# Patient Record
Sex: Female | Born: 1945 | Hispanic: No | Marital: Married | Smoking: Never smoker
Health system: Southern US, Community
[De-identification: ages and names within clinical notes are randomized; demographics above are authoritative.]

## PROBLEM LIST (undated history)

## (undated) DIAGNOSIS — I4819 Other persistent atrial fibrillation: Secondary | ICD-10-CM

## (undated) DIAGNOSIS — N189 Chronic kidney disease, unspecified: Secondary | ICD-10-CM

## (undated) DIAGNOSIS — G4730 Sleep apnea, unspecified: Secondary | ICD-10-CM

## (undated) DIAGNOSIS — I5022 Chronic systolic (congestive) heart failure: Secondary | ICD-10-CM

## (undated) DIAGNOSIS — D649 Anemia, unspecified: Secondary | ICD-10-CM

## (undated) DIAGNOSIS — K219 Gastro-esophageal reflux disease without esophagitis: Secondary | ICD-10-CM

## (undated) DIAGNOSIS — I499 Cardiac arrhythmia, unspecified: Secondary | ICD-10-CM

## (undated) DIAGNOSIS — I10 Essential (primary) hypertension: Secondary | ICD-10-CM

## (undated) DIAGNOSIS — M1990 Unspecified osteoarthritis, unspecified site: Secondary | ICD-10-CM

## (undated) HISTORY — DX: Other persistent atrial fibrillation: I48.19

## (undated) HISTORY — PX: TONSILLECTOMY: SUR1361

## (undated) HISTORY — PX: OTHER SURGICAL HISTORY: SHX169

---

## 2007-07-13 ENCOUNTER — Emergency Department (HOSPITAL_COMMUNITY): Admission: EM | Admit: 2007-07-13 | Discharge: 2007-07-13 | Payer: Self-pay | Admitting: Emergency Medicine

## 2007-10-02 ENCOUNTER — Encounter: Admission: RE | Admit: 2007-10-02 | Discharge: 2007-10-02 | Payer: Self-pay | Admitting: Orthopedic Surgery

## 2013-09-23 ENCOUNTER — Encounter (HOSPITAL_COMMUNITY): Payer: Self-pay | Admitting: *Deleted

## 2013-09-23 ENCOUNTER — Inpatient Hospital Stay (HOSPITAL_COMMUNITY)
Admission: EM | Admit: 2013-09-23 | Discharge: 2013-09-27 | DRG: 286 | Disposition: A | Discharge status: Home / Self Care | Payer: Medicare Other | Attending: Cardiology | Admitting: Cardiology

## 2013-09-23 ENCOUNTER — Emergency Department (HOSPITAL_COMMUNITY): Payer: Medicare Other

## 2013-09-23 DIAGNOSIS — I472 Ventricular tachycardia, unspecified: Secondary | ICD-10-CM | POA: Diagnosis present

## 2013-09-23 DIAGNOSIS — I4729 Other ventricular tachycardia: Secondary | ICD-10-CM | POA: Diagnosis present

## 2013-09-23 DIAGNOSIS — I2510 Atherosclerotic heart disease of native coronary artery without angina pectoris: Secondary | ICD-10-CM | POA: Diagnosis present

## 2013-09-23 DIAGNOSIS — I4891 Unspecified atrial fibrillation: Principal | ICD-10-CM | POA: Diagnosis present

## 2013-09-23 DIAGNOSIS — I428 Other cardiomyopathies: Secondary | ICD-10-CM | POA: Diagnosis present

## 2013-09-23 DIAGNOSIS — Z6841 Body Mass Index (BMI) 40.0 and over, adult: Secondary | ICD-10-CM

## 2013-09-23 DIAGNOSIS — I2789 Other specified pulmonary heart diseases: Secondary | ICD-10-CM | POA: Diagnosis present

## 2013-09-23 DIAGNOSIS — K219 Gastro-esophageal reflux disease without esophagitis: Secondary | ICD-10-CM | POA: Diagnosis present

## 2013-09-23 DIAGNOSIS — I5021 Acute systolic (congestive) heart failure: Secondary | ICD-10-CM | POA: Diagnosis present

## 2013-09-23 DIAGNOSIS — Z79899 Other long term (current) drug therapy: Secondary | ICD-10-CM

## 2013-09-23 DIAGNOSIS — I509 Heart failure, unspecified: Secondary | ICD-10-CM | POA: Diagnosis present

## 2013-09-23 DIAGNOSIS — Z7982 Long term (current) use of aspirin: Secondary | ICD-10-CM

## 2013-09-23 DIAGNOSIS — M1990 Unspecified osteoarthritis, unspecified site: Secondary | ICD-10-CM | POA: Diagnosis present

## 2013-09-23 DIAGNOSIS — E876 Hypokalemia: Secondary | ICD-10-CM | POA: Diagnosis present

## 2013-09-23 HISTORY — DX: Unspecified osteoarthritis, unspecified site: M19.90

## 2013-09-23 HISTORY — DX: Morbid (severe) obesity due to excess calories: E66.01

## 2013-09-23 HISTORY — DX: Chronic systolic (congestive) heart failure: I50.22

## 2013-09-23 HISTORY — DX: Gastro-esophageal reflux disease without esophagitis: K21.9

## 2013-09-23 LAB — APTT: aPTT: 28 seconds (ref 24–37)

## 2013-09-23 LAB — BASIC METABOLIC PANEL
BUN: 23 mg/dL (ref 6–23)
CO2: 23 mEq/L (ref 19–32)
Calcium: 8.9 mg/dL (ref 8.4–10.5)
Chloride: 102 mEq/L (ref 96–112)
Creatinine, Ser: 0.89 mg/dL (ref 0.50–1.10)
GFR calc Af Amer: 77 mL/min — ABNORMAL LOW (ref >90)
GFR calc non Af Amer: 66 mL/min — ABNORMAL LOW (ref >90)
Glucose, Bld: 139 mg/dL — ABNORMAL HIGH (ref 70–99)
Potassium: 4.3 mEq/L (ref 3.5–5.1)
Sodium: 139 mEq/L (ref 135–145)

## 2013-09-23 LAB — CBC
HCT: 42.6 % (ref 36.0–46.0)
Hemoglobin: 13.5 g/dL (ref 12.0–15.0)
MCH: 26.7 pg (ref 26.0–34.0)
MCHC: 31.7 g/dL (ref 30.0–36.0)
MCV: 84.2 fL (ref 78.0–100.0)
Platelets: 429 10*3/uL — ABNORMAL HIGH (ref 150–400)
RBC: 5.06 MIL/uL (ref 3.87–5.11)
RDW: 14.2 % (ref 11.5–15.5)
WBC: 8.6 10*3/uL (ref 4.0–10.5)

## 2013-09-23 LAB — PROTIME-INR
INR: 1.09 (ref 0.00–1.49)
Prothrombin Time: 13.9 seconds (ref 11.6–15.2)

## 2013-09-23 LAB — POCT I-STAT TROPONIN I: Troponin i, poc: 0.05 ng/mL (ref 0.00–0.08)

## 2013-09-23 MED ORDER — DILTIAZEM HCL 25 MG/5ML IV SOLN
20.0000 mg | Freq: Once | INTRAVENOUS | Status: AC
  Administered 2013-09-23: 20 mg via INTRAVENOUS

## 2013-09-23 MED ORDER — DILTIAZEM HCL 100 MG IV SOLR
5.0000 mg/h | INTRAVENOUS | Status: DC
  Administered 2013-09-23 – 2013-09-24 (×3): 5 mg/h via INTRAVENOUS
  Filled 2013-09-23 (×2): qty 100

## 2013-09-23 MED ORDER — SODIUM CHLORIDE 0.9 % IV SOLN: 250.0000 mL | INTRAVENOUS | Status: DC | PRN

## 2013-09-23 MED ORDER — FUROSEMIDE 10 MG/ML IJ SOLN
40.0000 mg | Freq: Once | INTRAMUSCULAR | Status: AC
  Administered 2013-09-24: 40 mg via INTRAVENOUS
  Filled 2013-09-23: qty 4

## 2013-09-23 MED ORDER — ALPRAZOLAM 0.25 MG PO TABS: 0.2500 mg | ORAL_TABLET | Freq: Two times a day (BID) | ORAL | Status: DC | PRN

## 2013-09-23 MED ORDER — SODIUM CHLORIDE 0.9 % IJ SOLN
3.0000 mL | Freq: Two times a day (BID) | INTRAMUSCULAR | Status: DC
  Administered 2013-09-25 (×2): 3 mL via INTRAVENOUS
  Administered 2013-09-26: 6 mL via INTRAVENOUS
  Administered 2013-09-26: 3 mL via INTRAVENOUS

## 2013-09-23 MED ORDER — FAMOTIDINE 20 MG PO TABS
20.0000 mg | ORAL_TABLET | Freq: Two times a day (BID) | ORAL | Status: DC
  Administered 2013-09-24 – 2013-09-27 (×8): 20 mg via ORAL
  Filled 2013-09-23 (×10): qty 1

## 2013-09-23 MED ORDER — ACETAMINOPHEN 325 MG PO TABS: 650.0000 mg | ORAL_TABLET | ORAL | Status: DC | PRN

## 2013-09-23 MED ORDER — HEPARIN (PORCINE) IN NACL 100-0.45 UNIT/ML-% IJ SOLN
1450.0000 [IU]/h | Freq: Once | INTRAMUSCULAR | Status: AC
  Administered 2013-09-23: 1450 [IU]/h via INTRAVENOUS
  Filled 2013-09-23: qty 250

## 2013-09-23 MED ORDER — SODIUM CHLORIDE 0.9 % IJ SOLN: 3.0000 mL | INTRAMUSCULAR | Status: DC | PRN

## 2013-09-23 MED ORDER — ASPIRIN EC 81 MG PO TBEC
81.0000 mg | DELAYED_RELEASE_TABLET | Freq: Every day | ORAL | Status: DC
  Administered 2013-09-24 – 2013-09-25 (×2): 81 mg via ORAL
  Filled 2013-09-23 (×2): qty 1

## 2013-09-23 MED ORDER — ZOLPIDEM TARTRATE 5 MG PO TABS: 5.0000 mg | ORAL_TABLET | Freq: Every evening | ORAL | Status: DC | PRN

## 2013-09-23 MED ORDER — ONDANSETRON HCL 4 MG/2ML IJ SOLN
4.0000 mg | Freq: Four times a day (QID) | INTRAMUSCULAR | Status: DC | PRN
Start: 2013-09-23 — End: 2013-09-27

## 2013-09-23 MED ORDER — HEPARIN BOLUS VIA INFUSION
5000.0000 [IU] | Freq: Once | INTRAVENOUS | Status: AC
  Administered 2013-09-23: 5000 [IU] via INTRAVENOUS
  Filled 2013-09-23: qty 5000

## 2013-09-23 MED ORDER — HEPARIN (PORCINE) IN NACL 100-0.45 UNIT/ML-% IJ SOLN
1500.0000 [IU]/h | INTRAMUSCULAR | Status: DC
  Administered 2013-09-23: 1450 [IU]/h via INTRAVENOUS
  Administered 2013-09-24 – 2013-09-25 (×2): 1550 [IU]/h via INTRAVENOUS
  Administered 2013-09-25 (×2): 1750 [IU]/h via INTRAVENOUS
  Filled 2013-09-23 (×6): qty 250

## 2013-09-23 MED ORDER — NITROGLYCERIN 0.4 MG SL SUBL: 0.4000 mg | SUBLINGUAL_TABLET | SUBLINGUAL | Status: DC | PRN

## 2013-09-23 NOTE — ED Provider Notes (Signed)
CSN: [id number]     Arrival date & time 09/23/13  1459 History   First MD Initiated Contact with Patient 09/23/13 1523     Chief Complaint  Patient presents with  . Irregular Heart Beat  . Heartburn  . Shortness of Breath   (Consider location/radiation/quality/duration/timing/severity/associated sxs/prior Treatment) HPI Comments: Began taking meloxicam for her knee pain about 6 weeks ago. Began having heartburn for past 5 weeks. Intermittent daily heartburn with occasional palpitations. Worsening over the past 2 weeks. States no true palpitations, just some queasy feelings in her chest, relieved with belching. No chest pain, no SOB. No hx of afib. At her doctor's office today, found to be in Afib with RVR, sent here for further eval.   Patient is a 67 y.o. female presenting with heartburn and shortness of breath.  Heartburn This is a recurrent problem. The current episode started more than 1 week ago (over 5 weeks ago). The problem occurs daily. The problem has been gradually worsening. Pertinent negatives include no chest pain, no abdominal pain, no headaches and no shortness of breath. Nothing aggravates the symptoms. Nothing relieves the symptoms. She has tried nothing for the symptoms.  Shortness of Breath Associated symptoms: no abdominal pain, no chest pain, no cough, no fever and no headaches     History reviewed. No pertinent past medical history. Past Surgical History  Procedure Laterality Date  . Joint replacement     No family history on file. History  Substance Use Topics  . Smoking status: Never Smoker   . Smokeless tobacco: Not on file  . Alcohol Use: No   OB History   Grav Para Term Preterm Abortions TAB SAB Ect Mult Living                 Review of Systems  Constitutional: Negative for fever.  Respiratory: Negative for cough and shortness of breath.   Cardiovascular: Negative for chest pain.  Gastrointestinal: Positive for heartburn. Negative for abdominal  pain.  Neurological: Negative for headaches.  All other systems reviewed and are negative.    Allergies  Review of patient's allergies indicates no known allergies.  Home Medications   Current Outpatient Rx  Name  Route  Sig  Dispense  Refill  . BETAINE PO   Oral   Take 2 capsules by mouth daily as needed (acid reflux).         . Digestive Enzymes (MULTI-ENZYME PO)   Oral   Take 1 capsule by mouth daily as needed (acid reflux).          BP 139/92  Pulse 137  Temp(Src) 97.6 F (36.4 C) (Oral)  Resp 20  Wt 269 lb 12.8 oz (122.38 kg)  SpO2 94% Physical Exam  Nursing note and vitals reviewed. Constitutional: She is oriented to person, place, and time. She appears well-developed and well-nourished. No distress.  HENT:  Head: Normocephalic and atraumatic.  Eyes: EOM are normal. Pupils are equal, round, and reactive to light.  Neck: Normal range of motion. Neck supple.  Cardiovascular: An irregularly irregular rhythm present. Tachycardia present.  Exam reveals no friction rub.   No murmur heard. Pulmonary/Chest: Effort normal and breath sounds normal. No respiratory distress. She has no wheezes. She has no rales.  Abdominal: Soft. She exhibits no distension. There is no tenderness. There is no rebound.  Musculoskeletal: Normal range of motion. She exhibits no edema.  Neurological: She is alert and oriented to person, place, and time. No cranial nerve deficit. She exhibits  normal muscle tone. Coordination normal.  Skin: No rash noted. She is not diaphoretic.    ED Course  Procedures (including critical care time) Labs Review Labs Reviewed  CBC  BASIC METABOLIC PANEL  TSH  URINE RAPID DRUG SCREEN (HOSP PERFORMED)  PROTIME-INR  APTT   Imaging Review Dg Chest 2 View  09/23/2013   CLINICAL DATA:  Chest pain and shortness of breath  EXAM: CHEST  2 VIEW  COMPARISON:  None  FINDINGS: Cardiac shadow is enlarged. There is prominence of the central pulmonary vascularity  identified. No focal infiltrate or sizable effusion is seen. There is fullness surrounding the left pulmonary artery. This is somewhat suspicious for a mass lesion but is not as well visualized on the lateral projection. No acute bony abnormality is noted.  IMPRESSION: Prominence of the central vascularity.  Prominent soft tissue density adjacent to the left pulmonary artery. CT of the chest is recommended for further evaluation to rule out an underlying mass.   Electronically Signed   By: Alcide Clever M.D.   On: 09/23/2013 15:50    Date: 09/23/2013  Rate: 137  Rhythm: atrial fibrillation and with RVR  QRS Axis: normal  Intervals: normal  ST/T Wave abnormalities: flipped T waves in III  Conduction Disutrbances:left posterior fascicular block  Narrative Interpretation:   Old EKG Reviewed: none available  CRITICAL CARE Performed by: Dagmar Hait   Total critical care time: 30  Critical care time was exclusive of separately billable procedures and treating other patients.  Critical care was necessary to treat or prevent imminent or life-threatening deterioration.  Critical care was time spent personally by me on the following activities: development of treatment plan with patient and/or surrogate as well as nursing, discussions with consultants, evaluation of patient's response to treatment, examination of patient, obtaining history from patient or surrogate, ordering and performing treatments and interventions, ordering and review of laboratory studies, ordering and review of radiographic studies, pulse oximetry and re-evaluation of patient's condition.   MDM   1. Atrial fibrillation with RVR    86F sent here with new onset Afib. In RVR on arrival. Normal BPs. Denying CP, SOB. Well-appearing, relaxing comfortably. EKG with Afib with RVR, no acute ischemia. Dilt bolus and drip initiated. With true unknown onset, possibly over past 5 weeks, heparin bolus initiated for  anti-coagulation. After drip started, heart rate much better controlled. Patient relaxing, doing well.  Cardiology consulted and will admit.     Dagmar Hait, MD 09/24/13 [phone]

## 2013-09-23 NOTE — ED Notes (Signed)
Admitting physician at bedside

## 2013-09-23 NOTE — ED Notes (Signed)
Pt was sent here from MD office after having intermittent indigestion feeling and states gets sob with these episodes.  Pt shown to be in afib in the 150s at MD office and sent here.

## 2013-09-23 NOTE — Progress Notes (Signed)
ANTICOAGULATION CONSULT NOTE - Initial Consult  Pharmacy Consult for heparin Indication: atrial fibrillation  No Known Allergies  Patient Measurements: Weight: 269 lb 12.8 oz (122.38 kg) Heparin Dosing Weight: 100kg  Vital Signs: Temp: 97.6 F (36.4 C) (10/06 1508) Temp src: Oral (10/06 1508) BP: 139/92 mmHg (10/06 1508) Pulse Rate: 137 (10/06 1508)  Labs:  Recent Labs  09/23/13 1510 09/23/13 1600  HGB 13.5  --   HCT 42.6  --   PLT 429*  --   APTT  --  28  LABPROT  --  13.9  INR  --  1.09    CrCl is unknown because no creatinine reading has been taken and the patient has no height on file.   Medical History: History reviewed. No pertinent past medical history.  Assessment: 67 year old female with new onset afib with RVR. Orders received to start IV heparin and dilt. CBC/coags within normal limits.  Goal of Therapy:  Heparin level 0.3-0.7 units/ml Monitor platelets by anticoagulation protocol: Yes   Plan:  Give 5000 units bolus x 1 Start heparin infusion at 1450 units/hr Check anti-Xa level in 6 hours and daily while on heparin Continue to monitor H&H and platelets  Sheppard Coil PharmD., BCPS Clinical Pharmacist Pager [phone] 09/23/2013 8:38 PM

## 2013-09-23 NOTE — H&P (Signed)
Patient ID: Kelli Webster MRN: [id number], DOB/AGE: [birth year]   Admit date: 09/23/2013  Primary Physician: Herb Grays, MD Primary Cardiologist: new - seen by Eloy End, MD   Pt. Profile:  67 year old female without prior cardiac history presented to ED today with a month plus history of GERD-like symptoms and has been found to be in atrial fibrillation with rapid ventricular response.  Problem List  Past Medical History  Diagnosis Date  . GERD (gastroesophageal reflux disease)   . Morbid obesity   . A-fib     a. Dx 09/2013  . Osteoarthritis     Past Surgical History  Procedure Laterality Date  . Joint replacement       Allergies  No Known Allergies  HPI  67 year old female with the above problem list. She does not frequently see a physician and this has little limited known medical history. Over the summer, she became concerned about her weight and by her description began a crash diet. At the same time, she was taking Mobic for knee pain. During that time, she began to experience increasing indigestion-like symptoms involving both her epigastrium and substernal areas often occurring about one hour after a meal associated with a metallic taste in her mouth as well as cough, and resolving with over-the-counter antacids. Because of the symptoms, she stopped her crash diet and also discontinue Mobic. She tried taking Nexium however this caused been nauseated. She subsequently tried Prilosec and this also caused nausea. As result, she treated her symptoms with when necessary TUMS and also Zantac with success acutely, though she would experience recurrent symptoms with subsequent meals or with lying down. At one point, she saw some in a health food store who advised her that she likely had low acid and she began taking 2 over-the-counter supplements for treatment of low acid. After beginning the supplements, she did have some relief from her symptoms within a week she had recurrence.     Over the past month, her GERD-like symptoms and indigestion have worsened and become associated with abdominal bloating, frequent cough, and frequent belching. She has also been experiencing dyspnea exertion and more recently lower extremity edema. She reports that do to GERD-like symptoms, she's not been able to lie flat in bed and as result she has been sleeping sitting; and with her feet in a dependent position. She attributes her edema to this. On 2 occasions, she had exertional epigastric and substernal chest discomfort which began as feeling as though her stomach is burning and and this worked her way up into her epigastric and chest area and was associated with dyspnea. This resolved with rest and antacids.   Due to her progressive indigestion symptoms, she was seen by her primary care provider today. She was noted to be tachycardic and an ECG was taken and showed atrial fibrillation with rapid ventricular response. At that point she was sent to the emergency department for further evaluation. Here, she's been placed on IV diltiazem as well as IV heparin and her rate has come down from the 130s to low 100s. She denies any recent history of tachypalpitations, though she admits to occasional fluttering palpitations in the remote past. Her CHADS2 = 0;  CHA2DS2VASc = 2 (age 47, female).  Home Medications  Prior to Admission medications   Medication Sig Start Date End Date Taking? Authorizing Provider  aspirin 325 MG tablet Take 325 mg by mouth daily as needed for pain.   Yes Historical Provider, MD  BETAINE PO Take  2 capsules by mouth daily as needed (acid reflux).   Yes Historical Provider, MD  Calcium Carbonate Antacid (ANTACID PO) Take 1 tablet by mouth daily as needed (acid reflux).   Yes Historical Provider, MD  Digestive Enzymes (MULTI-ENZYME PO) Take 1 capsule by mouth daily as needed (acid reflux).   Yes Historical Provider, MD  esomeprazole (NEXIUM) 40 MG capsule Take 40 mg by mouth daily  before breakfast.   Yes Historical Provider, MD  omeprazole (PRILOSEC) 20 MG capsule Take 20 mg by mouth daily.   Yes Historical Provider, MD  Ranitidine HCl (ZANTAC PO) Take 1 tablet by mouth 2 (two) times daily as needed (acid reflux).   Yes Historical Provider, MD    Family History  Family History  Problem Relation Age of Onset  . Heart attack Father     died @ 62, multiple MI's  . Other Mother     alive and well in her 85's.  . Other Brother     A & W  . Other Sister     A & W  . Other Sister     A & W    Social History  History   Social History  . Marital Status: Married    Spouse Name: N/A    Number of Children: N/A  . Years of Education: N/A   Occupational History  . Not on file.   Social History Main Topics  . Smoking status: Never Smoker   . Smokeless tobacco: Not on file  . Alcohol Use: No  . Drug Use: No  . Sexual Activity: Not on file   Other Topics Concern  . Not on file   Social History Narrative   Lives in [place] with husband.  Does not routinely exercise.     Review of Systems General:  No chills, fever, night sweats or weight changes.  Cardiovascular:  +++ dyspnea on exertion as outline above.  She has had both epigastric and substernal chest discomfort as outlined above, which she attributes to GERD.  +++ bilat LE edema in the past 2-3 wks.  occas palpitations.  No orthopnea, parox. nocturnal dyspnea. Dermatological: No rash, lesions/masses Respiratory: +++ cough, +++ dyspnea Urologic: No hematuria, dysuria Abdominal:  +++ indigestion/burning/metallic taste/belching/bloathing.  No nausea, vomiting, diarrhea, bright red blood per rectum, melena, or hematemesis Neurologic:  No visual changes, wkns, changes in mental status. All other systems reviewed and are otherwise negative except as noted above.  Physical Exam  Blood pressure 145/89, pulse 97, temperature 97.6 F (36.4 C), temperature source Oral, resp. rate 21, weight 269 lb 12.8 oz  (122.38 kg), SpO2 95.00%.  General: Pleasant, NAD Psych: Normal affect. Neuro: Alert and oriented X 3. Moves all extremities spontaneously. HEENT: Normal  Neck: Supple without bruits or JVD. Lungs:  Resp regular and unlabored, few left basilar crackles. Heart: IR, IR no s3, s4, or murmurs. Abdomen: Soft, non-tender, non-distended, BS + x 4.  Extremities: No clubbing, cyanosis.  2+ bilat LE edema to mid-calf. DP/PT/Radials 2+ and equal bilaterally.  Labs  Trop i, poc: 0.05  Lab Results  Component Value Date   WBC 8.6 09/23/2013   HGB 13.5 09/23/2013   HCT 42.6 09/23/2013   MCV 84.2 09/23/2013   PLT 429* 09/23/2013    Recent Labs Lab 09/23/13 1510  NA 139  K 4.3  CL 102  CO2 23  BUN 23  CREATININE 0.89  CALCIUM 8.9  GLUCOSE 139*    Radiology/Studies  Dg Chest 2 View  09/23/2013   CLINICAL DATA:  Chest pain and shortness of breath  EXAM: CHEST  2 VIEW  COMPARISON:  None  FINDINGS: Cardiac shadow is enlarged. There is prominence of the central pulmonary vascularity identified. No focal infiltrate or sizable effusion is seen. There is fullness surrounding the left pulmonary artery. This is somewhat suspicious for a mass lesion but is not as well visualized on the lateral projection. No acute bony abnormality is noted.  IMPRESSION: Prominence of the central vascularity.  Prominent soft tissue density adjacent to the left pulmonary artery. CT of the chest is recommended for further evaluation to rule out an underlying mass.   Electronically Signed   By: Alcide Clever M.D.   On: 09/23/2013 15:50   ECG  Afib, 137, pvc, inflat st dep with twi/biphasic t's (no old for comparison).  ASSESSMENT AND PLAN  1.  Afib RVR:  Pt presents with a several month h/o GERD-like Ss and indigestion with a 1 month h/o doe, cough, and LEE over the past 1-2 wks.  She has had intermittent palpitations in the past but nothing sustained that she is aware of.  She was found to be in afib with RVR today. Rates  are currently better on IV diltiazem.  We had a long discussion about the diagnosis and management of atrial fibrillation.  She seems to be convinced that her GERD symptoms are the cause.  We will admit, cycle CE, check TSH/Mg, and 2D echocardiogram.  Cont IV dilt and plan to switch to PO if she converts overnight.  Cont heparin for now.  CHADS2= 0.  CHA2DS2VASc = 2 (female/66).  If she converts by the AM, plan to switch to PO ccb vs bb and add ASA.  If she does not convert by the AM but can be adequately rate controlled, we will likely plan to anticoagulate for 3 wks and pursue cardioversion after that period of time.  If however her HR remains elevated, she will likely need TEE and cardioversion tomorrow followed by therapeutic anticoagulation.  Regarding anticoagulation, given severe GERD Ss since taking mobic over the summer, she would likely benefit from GI eval prior to committing her to a prolonged course of oral anticoagulation.  Call GI in AM.    2.  GERD/? PUD:  Severe Gerd-like Ss over the past few months.  She reports difficult management at least partially related to not tolerating PPI's 2/2 nausea.  She has tolerated H2 blockers.  Will add pepcid while in house.  If she requires oral anticoagulation, she will need to see GI prior to initiating, though H/H is currently stable.  F/U in AM as she will be on heparin tonight.  She has had some exertional "indigestion" associated with dyspnea.  If CE negative, will benefit from inpt vs outpt stress testing once other issues are taken care of.  3.  Mild volume overload:  Likely acute/chronic diast chf in setting of afib of unknown duration.  She has crackles on exam and lower extremity edema.  Will add lasix tonight.  Check echo in am.  Focus on HR control as above.  4.  Abnl CXR:  Soft tissue density noted adjacent to the L PA.  Will order CT with contrast for AM to re-eval as recommended.  Creat nl.  Signed, Nicolasa Ducking, NP 09/23/2013, 8:08  PM  The patient was seen, examined and discussed with Ward Givens, NP. 67 year old female with h/o GERD who was seen by her PCP for DOE and  was found to be in A-fib with RVR. She was started on Heparin drip and Diltiazem drip, currently rate controlled. The patient is volume overloaded with normal kidney function. We will order Lasix iv for diuresis. She describes severe GERD like symptoms after chronic high dose of NSAIDS and intolerance of PPIs. Plan is to perform TTE in the morning. We will consult GI for potential endoscopy prior to TEE. NPO after midnight in case urgent cardioversion is necessary.  TSH pending.  Tobias Alexander, H 09/23/2013

## 2013-09-23 NOTE — ED Notes (Signed)
Pt transported to xray 

## 2013-09-24 ENCOUNTER — Observation Stay (HOSPITAL_COMMUNITY): Payer: Medicare Other

## 2013-09-24 DIAGNOSIS — I4891 Unspecified atrial fibrillation: Principal | ICD-10-CM

## 2013-09-24 DIAGNOSIS — I059 Rheumatic mitral valve disease, unspecified: Secondary | ICD-10-CM

## 2013-09-24 LAB — TROPONIN I
Troponin I: <0.3 ng/mL (ref <0.30)
Troponin I: <0.3 ng/mL (ref <0.30)
Troponin I: <0.3 ng/mL (ref <0.30)

## 2013-09-24 LAB — LIPID PANEL
Cholesterol: 121 mg/dL (ref 0–200)
HDL: 45 mg/dL (ref >39)
LDL Cholesterol: 62 mg/dL (ref 0–99)
Total CHOL/HDL Ratio: 2.7 RATIO
Triglycerides: 72 mg/dL (ref <150)
VLDL: 14 mg/dL (ref 0–40)

## 2013-09-24 LAB — COMPREHENSIVE METABOLIC PANEL
ALT: 24 U/L (ref 0–35)
AST: 27 U/L (ref 0–37)
Albumin: 3.3 g/dL — ABNORMAL LOW (ref 3.5–5.2)
Alkaline Phosphatase: 55 U/L (ref 39–117)
BUN: 18 mg/dL (ref 6–23)
CO2: 26 mEq/L (ref 19–32)
Calcium: 8.5 mg/dL (ref 8.4–10.5)
Chloride: 103 mEq/L (ref 96–112)
Creatinine, Ser: 0.94 mg/dL (ref 0.50–1.10)
GFR calc Af Amer: 72 mL/min — ABNORMAL LOW (ref >90)
GFR calc non Af Amer: 62 mL/min — ABNORMAL LOW (ref >90)
Glucose, Bld: 105 mg/dL — ABNORMAL HIGH (ref 70–99)
Potassium: 3.6 mEq/L (ref 3.5–5.1)
Sodium: 143 mEq/L (ref 135–145)
Total Bilirubin: 1 mg/dL (ref 0.3–1.2)
Total Protein: 6.2 g/dL (ref 6.0–8.3)

## 2013-09-24 LAB — CBC
HCT: 39.4 % (ref 36.0–46.0)
Hemoglobin: 12.5 g/dL (ref 12.0–15.0)
MCH: 26.9 pg (ref 26.0–34.0)
MCHC: 31.7 g/dL (ref 30.0–36.0)
MCV: 84.9 fL (ref 78.0–100.0)
Platelets: 378 10*3/uL (ref 150–400)
RBC: 4.64 MIL/uL (ref 3.87–5.11)
RDW: 14.4 % (ref 11.5–15.5)
WBC: 7.4 10*3/uL (ref 4.0–10.5)

## 2013-09-24 LAB — HEPARIN LEVEL (UNFRACTIONATED)
Heparin Unfractionated: 0.3 IU/mL (ref 0.30–0.70)
Heparin Unfractionated: 0.42 IU/mL (ref 0.30–0.70)

## 2013-09-24 LAB — PRO B NATRIURETIC PEPTIDE: Pro B Natriuretic peptide (BNP): 4997 pg/mL — ABNORMAL HIGH (ref 0–125)

## 2013-09-24 LAB — TSH: TSH: 1.198 u[IU]/mL (ref 0.350–4.500)

## 2013-09-24 LAB — MAGNESIUM
Magnesium: 2 mg/dL (ref 1.5–2.5)
Magnesium: 1.7 mg/dL (ref 1.5–2.5)

## 2013-09-24 MED ORDER — IOHEXOL 300 MG/ML  SOLN
80.0000 mL | Freq: Once | INTRAMUSCULAR | Status: AC | PRN
  Administered 2013-09-24: 80 mL via INTRAVENOUS

## 2013-09-24 MED ORDER — MENTHOL 3 MG MT LOZG
1.0000 | LOZENGE | OROMUCOSAL | Status: DC | PRN
  Administered 2013-09-24: 3 mg via ORAL
  Filled 2013-09-24 (×2): qty 9

## 2013-09-24 MED ORDER — FUROSEMIDE 10 MG/ML IJ SOLN
40.0000 mg | Freq: Two times a day (BID) | INTRAMUSCULAR | Status: DC
  Administered 2013-09-24 (×2): 40 mg via INTRAVENOUS
  Filled 2013-09-24 (×3): qty 4

## 2013-09-24 MED ORDER — OFF THE BEAT BOOK
Freq: Once | Status: AC
  Administered 2013-09-24: 02:00:00
  Filled 2013-09-24: qty 1

## 2013-09-24 NOTE — Progress Notes (Addendum)
Subjective:  Patient feels better this am. Heart rate still atrial fib rate 95. Modest diuresis. Weight unchanged. Will increase lasix. Peripheral edema still present.  Objective:  Vital Signs in the last 24 hours: Temp:  [97.6 F (36.4 C)-98.2 F (36.8 C)] 98.2 F (36.8 C) (10/07 0830) Pulse Rate:  [55-137] 87 (10/07 0830) Resp:  [18-33] 18 (10/07 0830) BP: (126-147)/(51-93) 146/86 mmHg (10/07 0830) SpO2:  [92 %-97 %] 92 % (10/07 0830) Weight:  [268 lb (121.564 kg)-269 lb 12.8 oz (122.38 kg)] 268 lb (121.564 kg) (10/07 0500)  Intake/Output from previous day: 10/06 0701 - 10/07 0700 In: 284.1 [I.V.:284.1] Out: -  Intake/Output from this shift:    . aspirin EC  81 mg Oral Daily  . famotidine  20 mg Oral BID  . furosemide  40 mg Intravenous Q12H  . sodium chloride  3 mL Intravenous Q12H   . diltiazem (CARDIZEM) infusion 5 mg/hr (09/24/13 0121)  . heparin 1,550 Units/hr (09/24/13 0318)    Physical Exam: The patient appears to be in no distress.  Head and neck exam reveals that the pupils are equal and reactive.  The extraocular movements are full.  There is no scleral icterus.  Mouth and pharynx are benign.  No lymphadenopathy.  No carotid bruits.  The jugular venous pressure is normal.  Thyroid is not enlarged or tender.  Chest is clear to percussion and auscultation.  No rales or rhonchi.  Expansion of the chest is symmetrical.  Heart reveals no abnormal lift or heave.  First and second heart sounds are normal.  There is no murmur gallop rub or click.  The abdomen is soft and nontender.  Bowel sounds are normoactive.  There is no hepatosplenomegaly or mass.  There are no abdominal bruits.  Extremities reveal 2+ pretibial and pedal edema.  Neurologic exam is normal strength and no lateralizing weakness.  No sensory deficits.  Integument reveals no rash  Lab Results:  Recent Labs  09/23/13 1510 09/24/13 0530  WBC 8.6 7.4  HGB 13.5 12.5  PLT 429* 378     Recent Labs  09/23/13 1510 09/24/13 0530  NA 139 143  K 4.3 3.6  CL 102 103  CO2 23 26  GLUCOSE 139* 105*  BUN 23 18  CREATININE 0.89 0.94    Recent Labs  09/24/13 0150 09/24/13 0530  TROPONINI <0.30 <0.30   Hepatic Function Panel  Recent Labs  09/24/13 0530  PROT 6.2  ALBUMIN 3.3*  AST 27  ALT 24  ALKPHOS 55  BILITOT 1.0    Recent Labs  09/24/13 0530  CHOL 121   No results found for this basename: PROTIME,  in the last 72 hours  Imaging: Dg Chest 2 View  09/23/2013   CLINICAL DATA:  Chest pain and shortness of breath  EXAM: CHEST  2 VIEW  COMPARISON:  None  FINDINGS: Cardiac shadow is enlarged. There is prominence of the central pulmonary vascularity identified. No focal infiltrate or sizable effusion is seen. There is fullness surrounding the left pulmonary artery. This is somewhat suspicious for a mass lesion but is not as well visualized on the lateral projection. No acute bony abnormality is noted.  IMPRESSION: Prominence of the central vascularity.  Prominent soft tissue density adjacent to the left pulmonary artery. CT of the chest is recommended for further evaluation to rule out an underlying mass.   Electronically Signed   By: Alcide Clever M.D.   On: 09/23/2013 15:50  Cardiac Studies: 2D echo pending. Assessment/Plan:  1. Atrial fibrillation. Rate better controlled. Will continue IV heparin and IV diltiazem. She may require TEE/cardioversion tomorrow. Will keep NPO tonight. 2. Volume overload. Renal function normal. Will increase lasix to 40 mg BID 3. Severe GERD symptoms.  Will ask GI to see. Expect will need endoscopy to help [place] decide whether outpatient anticoagulation is feasible. 4. Abnormal Chest xray.  CT pending. 5. Abnormal EKG.  Widespread STT changes. Troponins negative. Follow EKGs.  LOS: 1 day    Cassell Clement 09/24/2013, 10:04 AM

## 2013-09-24 NOTE — Progress Notes (Signed)
ANTICOAGULATION CONSULT NOTE  Pharmacy Consult for heparin Indication: atrial fibrillation  No Known Allergies  Patient Measurements: Height: 5\' 3"  (160 cm) Weight: 268 lb 1.6 oz (121.609 kg) IBW/kg (Calculated) : 52.4 Heparin Dosing Weight: 100kg  Vital Signs: Temp: 98.2 F (36.8 C) (10/06 2237) Temp src: Oral (10/06 2237) BP: 136/83 mmHg (10/07 0001) Pulse Rate: 95 (10/07 0001)  Labs:  Recent Labs  09/23/13 1510 09/23/13 1600 09/24/13 0150  HGB 13.5  --   --   HCT 42.6  --   --   PLT 429*  --   --   APTT  --  28  --   LABPROT  --  13.9  --   INR  --  1.09  --   HEPARINUNFRC  --   --  0.30  CREATININE 0.89  --   --   TROPONINI  --   --  <0.30    Estimated Creatinine Clearance: 78.6 ml/min (by C-G formula based on Cr of 0.89).  Assessment: 67 year old female with afib for heparin  Goal of Therapy:  Heparin level 0.3-0.7 units/ml Monitor platelets by anticoagulation protocol: Yes   Plan:  Increase Heparin 1550 units/hr to keep in range  Geannie Risen, PharmD, BCPS  09/24/2013 3:11 AM

## 2013-09-24 NOTE — Progress Notes (Signed)
ANTICOAGULATION CONSULT NOTE - Follow-Up  Pharmacy Consult for heparin Indication: atrial fibrillation  No Known Allergies  Patient Measurements: Height: 5\' 3"  (160 cm) Weight: 268 lb (121.564 kg) IBW/kg (Calculated) : 52.4 Heparin Dosing Weight: 100kg  Vital Signs: Temp: 98.6 F (37 C) (10/07 1100) Temp src: Oral (10/07 1100) BP: 148/91 mmHg (10/07 1100) Pulse Rate: 102 (10/07 1100)  Labs:  Recent Labs  09/23/13 1510 09/23/13 1600 09/24/13 0150 09/24/13 0530 09/24/13 1100 09/24/13 1106  HGB 13.5  --   --  12.5  --   --   HCT 42.6  --   --  39.4  --   --   PLT 429*  --   --  378  --   --   APTT  --  28  --   --   --   --   LABPROT  --  13.9  --   --   --   --   INR  --  1.09  --   --   --   --   HEPARINUNFRC  --   --  0.30  --  0.42  --   CREATININE 0.89  --   --  0.94  --   --   TROPONINI  --   --  <0.30 <0.30  --  <0.30    Estimated Creatinine Clearance: 74.4 ml/min (by C-G formula based on Cr of 0.94).  Assessment: 67 year old female on heparin for afib. Heparin level 0.42 (therapeutic). No bleeding noted. CBC ok.  Goal of Therapy:  Heparin level 0.3-0.7 units/ml Monitor platelets by anticoagulation protocol: Yes   Plan:  Continue Heparin at 1550 units/hr  F/u daily heparin level and CBC  Christoper Fabian, PharmD, BCPS Clinical pharmacist, pager [phone] 09/24/2013 1:23 PM

## 2013-09-24 NOTE — Progress Notes (Signed)
Echocardiogram 2D Echocardiogram has been performed.  Kelli Webster 09/24/2013, 4:30 PM

## 2013-09-24 NOTE — Consult Note (Signed)
Subjective:   HPI  The patient is a 67 year old female who was admitted to the hospital with atrial fibrillation with rapid ventricular response. We are asked to see her in regards to symptoms suggestive of gastroesophageal reflux which she has been experiencing for about 6 weeks. She describes some epigastric and substernal burning sensation. She tried some Nexium as well as Prilosec but these didn't seem to agree with her. She has been belching a lot. She denies vomiting. In the summer she was taking meloxicam but she stopped this around mid August. She gives no history of peptic ulcer disease. I spoke to Dr. Patty Sermons who would like to make sure that she does not have a significant upper GI lesion because she is probably going to need to be cardioverted and then put on anticoagulants as an outpatient.  Review of Systems Currently not short of breath or having chest pain  Past Medical History  Diagnosis Date  . GERD (gastroesophageal reflux disease)   . Morbid obesity   . A-fib     a. Dx 09/2013  . Osteoarthritis    Past Surgical History  Procedure Laterality Date  . Joint replacement     History   Social History  . Marital Status: Married    Spouse Name: N/A    Number of Children: N/A  . Years of Education: N/A   Occupational History  . Not on file.   Social History Main Topics  . Smoking status: Never Smoker   . Smokeless tobacco: Not on file  . Alcohol Use: No  . Drug Use: No  . Sexual Activity: Not on file   Other Topics Concern  . Not on file   Social History Narrative   Lives in [place] with husband.  Does not routinely exercise.   family history includes Heart attack in her father; Other in her brother, mother, sister, and sister. Current facility-administered medications:0.9 %  sodium chloride infusion, 250 mL, Intravenous, PRN, Ok Anis, NP;  acetaminophen (TYLENOL) tablet 650 mg, 650 mg, Oral, Q4H PRN, Ok Anis, NP;  ALPRAZolam Prudy Feeler) tablet  0.25 mg, 0.25 mg, Oral, BID PRN, Ok Anis, NP;  aspirin EC tablet 81 mg, 81 mg, Oral, Daily, Ok Anis, NP, 81 mg at 09/24/13 1003 diltiazem (CARDIZEM) 100 mg in dextrose 5 % 100 mL infusion, 5-15 mg/hr, Intravenous, Titrated, Dagmar Hait, MD, Last Rate: 5 mL/hr at 09/24/13 0121, 5 mg/hr at 09/24/13 0121;  famotidine (PEPCID) tablet 20 mg, 20 mg, Oral, BID, Ok Anis, NP, 20 mg at 09/24/13 1004;  furosemide (LASIX) injection 40 mg, 40 mg, Intravenous, Q12H, Cassell Clement, MD, 40 mg at 09/24/13 1009 heparin ADULT infusion 100 units/mL (25000 units/250 mL), 1,550 Units/hr, Intravenous, Continuous, Lars Masson, MD, Last Rate: 15.5 mL/hr at 09/24/13 1004, 1,550 Units/hr at 09/24/13 1004;  nitroGLYCERIN (NITROSTAT) SL tablet 0.4 mg, 0.4 mg, Sublingual, Q5 Min x 3 PRN, Ok Anis, NP;  ondansetron (ZOFRAN) injection 4 mg, 4 mg, Intravenous, Q6H PRN, Ok Anis, NP sodium chloride 0.9 % injection 3 mL, 3 mL, Intravenous, Q12H, Ok Anis, NP;  sodium chloride 0.9 % injection 3 mL, 3 mL, Intravenous, PRN, Ok Anis, NP;  zolpidem (AMBIEN) tablet 5 mg, 5 mg, Oral, QHS PRN, Ok Anis, NP No Known Allergies   Objective:     BP 148/91  Pulse 102  Temp(Src) 98.6 F (37 C) (Oral)  Resp 20  Ht 5\' 3"  (1.6 m)  Wt 121.564  kg (268 lb)  BMI 47.49 kg/m2  SpO2 96%  She is in no distress   Nonicteric  Heart irregular irregular rhythm  Lungs clear  Abdomen: Bowel sounds normal, soft, nontender  Laboratory No components found with this basename: d1      Assessment:     GERD  Atrial fibrillation      Plan:     I will go ahead and plan EGD tomorrow to evaluate for mucosal damage or Barrett's esophagus in the upper GI tract.

## 2013-09-24 NOTE — Progress Notes (Addendum)
Pt with 20 beats of VT, pt asymptomatic; pt states she was getting aggravated and sat up fast; pts BP 146/ 84, HR A-fib 101; Hurman Horn, Pa paged and made aware; will continue to monitor

## 2013-09-25 ENCOUNTER — Encounter (HOSPITAL_COMMUNITY): Payer: Self-pay | Admitting: *Deleted

## 2013-09-25 ENCOUNTER — Encounter (HOSPITAL_COMMUNITY)
Admission: EM | Disposition: A | Discharge status: Home / Self Care | Payer: Self-pay | Source: Home / Self Care | Attending: Cardiology

## 2013-09-25 DIAGNOSIS — I509 Heart failure, unspecified: Secondary | ICD-10-CM

## 2013-09-25 DIAGNOSIS — I5021 Acute systolic (congestive) heart failure: Secondary | ICD-10-CM

## 2013-09-25 HISTORY — PX: ESOPHAGOGASTRODUODENOSCOPY: SHX5428

## 2013-09-25 LAB — CBC
HCT: 37.9 % (ref 36.0–46.0)
Hemoglobin: 12.3 g/dL (ref 12.0–15.0)
MCH: 27.5 pg (ref 26.0–34.0)
MCHC: 32.5 g/dL (ref 30.0–36.0)
MCV: 84.6 fL (ref 78.0–100.0)
Platelets: 366 10*3/uL (ref 150–400)
RBC: 4.48 MIL/uL (ref 3.87–5.11)
RDW: 14.4 % (ref 11.5–15.5)
WBC: 7.6 10*3/uL (ref 4.0–10.5)

## 2013-09-25 LAB — BASIC METABOLIC PANEL
BUN: 13 mg/dL (ref 6–23)
CO2: 32 mEq/L (ref 19–32)
Calcium: 8.5 mg/dL (ref 8.4–10.5)
Chloride: 100 mEq/L (ref 96–112)
Creatinine, Ser: 1 mg/dL (ref 0.50–1.10)
GFR calc Af Amer: 67 mL/min — ABNORMAL LOW (ref >90)
GFR calc non Af Amer: 57 mL/min — ABNORMAL LOW (ref >90)
Glucose, Bld: 96 mg/dL (ref 70–99)
Potassium: 3 mEq/L — ABNORMAL LOW (ref 3.5–5.1)
Sodium: 143 mEq/L (ref 135–145)

## 2013-09-25 LAB — HEPARIN LEVEL (UNFRACTIONATED)
Heparin Unfractionated: 0.26 IU/mL — ABNORMAL LOW (ref 0.30–0.70)
Heparin Unfractionated: 0.62 IU/mL (ref 0.30–0.70)

## 2013-09-25 SURGERY — EGD (ESOPHAGOGASTRODUODENOSCOPY)
Anesthesia: Moderate Sedation

## 2013-09-25 MED ORDER — MIDAZOLAM HCL 5 MG/ML IJ SOLN
INTRAMUSCULAR | Status: AC
  Filled 2013-09-25: qty 2

## 2013-09-25 MED ORDER — METOPROLOL TARTRATE 1 MG/ML IV SOLN: 5.0000 mg | Freq: Four times a day (QID) | INTRAVENOUS | Status: DC | PRN

## 2013-09-25 MED ORDER — DEXTROSE 5 % IV SOLN
5.0000 mg/h | INTRAVENOUS | Status: DC
  Filled 2013-09-25: qty 100

## 2013-09-25 MED ORDER — FUROSEMIDE 40 MG PO TABS
40.0000 mg | ORAL_TABLET | Freq: Two times a day (BID) | ORAL | Status: DC
  Administered 2013-09-25: 40 mg via ORAL
  Filled 2013-09-25 (×3): qty 1

## 2013-09-25 MED ORDER — FUROSEMIDE 10 MG/ML IJ SOLN
40.0000 mg | Freq: Two times a day (BID) | INTRAMUSCULAR | Status: DC
  Administered 2013-09-25 – 2013-09-26 (×2): 40 mg via INTRAVENOUS
  Filled 2013-09-25 (×4): qty 4

## 2013-09-25 MED ORDER — MIDAZOLAM HCL 10 MG/2ML IJ SOLN
INTRAMUSCULAR | Status: DC | PRN
  Administered 2013-09-25 (×2): 2 mg via INTRAVENOUS

## 2013-09-25 MED ORDER — LISINOPRIL 5 MG PO TABS
5.0000 mg | ORAL_TABLET | Freq: Every day | ORAL | Status: DC
  Administered 2013-09-25 – 2013-09-26 (×2): 5 mg via ORAL
  Filled 2013-09-25 (×2): qty 1

## 2013-09-25 MED ORDER — DIGOXIN 125 MCG PO TABS
0.1250 mg | ORAL_TABLET | Freq: Every day | ORAL | Status: DC
  Administered 2013-09-25 – 2013-09-27 (×3): 0.125 mg via ORAL
  Filled 2013-09-25 (×3): qty 1

## 2013-09-25 MED ORDER — POTASSIUM CHLORIDE CRYS ER 20 MEQ PO TBCR
40.0000 meq | EXTENDED_RELEASE_TABLET | Freq: Once | ORAL | Status: AC
  Administered 2013-09-25: 40 meq via ORAL
  Filled 2013-09-25: qty 2

## 2013-09-25 MED ORDER — SODIUM CHLORIDE 0.9 % IV SOLN
INTRAVENOUS | Status: DC
  Administered 2013-09-25: 500 mL via INTRAVENOUS

## 2013-09-25 MED ORDER — SPIRONOLACTONE 12.5 MG HALF TABLET
12.5000 mg | ORAL_TABLET | Freq: Every day | ORAL | Status: DC
  Administered 2013-09-25: 12.5 mg via ORAL
  Filled 2013-09-25 (×2): qty 1

## 2013-09-25 MED ORDER — FENTANYL CITRATE 0.05 MG/ML IJ SOLN
INTRAMUSCULAR | Status: DC | PRN
  Administered 2013-09-25 (×2): 25 ug via INTRAVENOUS

## 2013-09-25 MED ORDER — FENTANYL CITRATE 0.05 MG/ML IJ SOLN
INTRAMUSCULAR | Status: AC
  Filled 2013-09-25: qty 2

## 2013-09-25 MED ORDER — CARVEDILOL 6.25 MG PO TABS
6.2500 mg | ORAL_TABLET | Freq: Two times a day (BID) | ORAL | Status: DC
  Administered 2013-09-25 (×2): 6.25 mg via ORAL
  Filled 2013-09-25 (×5): qty 1

## 2013-09-25 MED ORDER — BUTAMBEN-TETRACAINE-BENZOCAINE 2-2-14 % EX AERO
INHALATION_SPRAY | CUTANEOUS | Status: DC | PRN
  Administered 2013-09-25: 2 via TOPICAL

## 2013-09-25 MED ORDER — POTASSIUM CHLORIDE CRYS ER 20 MEQ PO TBCR
20.0000 meq | EXTENDED_RELEASE_TABLET | Freq: Every day | ORAL | Status: DC
  Filled 2013-09-25: qty 1

## 2013-09-25 NOTE — Progress Notes (Signed)
ANTICOAGULATION CONSULT NOTE - Follow-Up  Pharmacy Consult for heparin Indication: atrial fibrillation  No Known Allergies  Patient Measurements: Height: 5\' 3"  (160 cm) Weight: 258 lb 11.2 oz (117.346 kg) IBW/kg (Calculated) : 52.4 Heparin Dosing Weight: 100kg  Vital Signs: Temp: 98 F (36.7 C) (10/08 1339) Temp src: Oral (10/08 1339) BP: 140/76 mmHg (10/08 1651) Pulse Rate: 71 (10/08 1651)  Labs:  Recent Labs  09/23/13 1510 09/23/13 1600  09/24/13 0150 09/24/13 0530 09/24/13 1100 09/24/13 1106 09/25/13 0430 09/25/13 1617  HGB 13.5  --   --   --  12.5  --   --  12.3  --   HCT 42.6  --   --   --  39.4  --   --  37.9  --   PLT 429*  --   --   --  378  --   --  366  --   APTT  --  28  --   --   --   --   --   --   --   LABPROT  --  13.9  --   --   --   --   --   --   --   INR  --  1.09  --   --   --   --   --   --   --   HEPARINUNFRC  --   --   < > 0.30  --  0.42  --  0.26* 0.62  CREATININE 0.89  --   --   --  0.94  --   --  1.00  --   TROPONINI  --   --   --  <0.30 <0.30  --  <0.30  --   --   < > = values in this interval not displayed.  Estimated Creatinine Clearance: 68.5 ml/min (by C-G formula based on Cr of 1).  Assessment: 67 year old female on heparin for afib. Heparin level is therapeutic after rate adjustment.  Goal of Therapy:  Heparin level 0.3-0.7 units/ml Monitor platelets by anticoagulation protocol: Yes   Plan:  Continue Heparin at 1750 units/hr Recheck Heparin level in 6 hours to confirm.  Estella Husk, Pharm.D., BCPS, AAHIVP Clinical Pharmacist Phone: [phone] or [phone] 09/25/2013, 5:05 PM

## 2013-09-25 NOTE — Op Note (Signed)
[facility] [address] [place], [address]   ENDOSCOPY PROCEDURE REPORT  PATIENT: Kelli Webster, Kelli Webster  MR#: [id number] BIRTHDATE: 1946/05/21 , 66  yrs. old GENDER: Female ENDOSCOPIST: Wandalee Ferdinand, MD REFERRED BY: Drr Patty Sermons PROCEDURE DATE:  09/25/2013 PROCEDURE:   EGD ASA CLASS: 3 INDICATIONS: GERD, rule out mucosal damage MEDICATIONS: fentanyl 50 mcg IV, Versed 4 mg IV TOPICAL ANESTHETIC: Cetacaine spray  DESCRIPTION OF PROCEDURE:   After the risks benefits and alternatives of the procedure were thoroughly explained, informed consent was obtained.  The Pentax Gastroscope [id number]  endoscope was introduced through the mouth and advanced to the second portion of the duodenum      , limited by Without limitations.   The instrument was slowly withdrawn as the mucosa was fully examined.      FINDINGS:  Esophagus: Normal. The esophagogastric junction was normal at 40 cm from the incisor teeth.  Stomach: Normal  Duodenum: Normal  There is no evidence of mucosal damage on this examination. There is nothing on this examination that is worrisome for potential bleeding.  COMPLICATIONS:none  ENDOSCOPIC IMPRESSION:see above   RECOMMENDATIONS:proceed with cardiology evaluation as planned. we will sign off. Call us again if needed      _______________________________ eSigned:  Wandalee Ferdinand, MD 09/25/2013 9:28 AM       PATIENT NAME:  Kelli Webster MR#: [id number]

## 2013-09-25 NOTE — Progress Notes (Signed)
Subjective:  No chest pain. Dyspnea is improved. Weight down 10 lb since yesterday. Runs of nonsustained VT overnight and her K is 3.0 today. 2D echo shows severe LV systolic dysfunction with EF 20-25 %  Objective:  Vital Signs in the last 24 hours: Temp:  [97.9 F (36.6 C)-98.6 F (37 C)] 98.2 F (36.8 C) (10/08 4098) Pulse Rate:  [87-110] 89 (10/08 0637) Resp:  [18-21] 20 (10/08 0637) BP: (130-148)/(79-93) 130/79 mmHg (10/08 0637) SpO2:  [92 %-98 %] 96 % (10/08 0637) Weight:  [258 lb 11.2 oz (117.346 kg)] 258 lb 11.2 oz (117.346 kg) (10/08 1191)  Intake/Output from previous day: 10/07 0701 - 10/08 0700 In: 240 [P.O.:240] Out: 3100 [Urine:3100] Intake/Output from this shift:    . aspirin EC  81 mg Oral Daily  . famotidine  20 mg Oral BID  . furosemide  40 mg Intravenous Q12H  . sodium chloride  3 mL Intravenous Q12H   . sodium chloride    . diltiazem (CARDIZEM) infusion 5 mg/hr (09/24/13 2202)  . heparin 1,550 Units/hr (09/25/13 0524)    Physical Exam: The patient appears to be in no distress.  Head and neck exam reveals that the pupils are equal and reactive.  The extraocular movements are full.  There is no scleral icterus.  Mouth and pharynx are benign.  No lymphadenopathy.  No carotid bruits.  The jugular venous pressure is normal.  Thyroid is not enlarged or tender.  Chest is clear to percussion and auscultation.  No rales or rhonchi.  Expansion of the chest is symmetrical.  Heart reveals no abnormal lift or heave.  First and second heart sounds are normal.  There is no murmur gallop rub or click.  The abdomen is soft and nontender.  Bowel sounds are normoactive.  There is no hepatosplenomegaly or mass.  There are no abdominal bruits.  Extremities reveal 2+ pretibial and pedal edema.  Neurologic exam is normal strength and no lateralizing weakness.  No sensory deficits.  Integument reveals no rash  Lab Results:  Recent Labs  09/24/13 0530  09/25/13 0430  WBC 7.4 7.6  HGB 12.5 12.3  PLT 378 366    Recent Labs  09/24/13 0530 09/25/13 0430  NA 143 143  K 3.6 3.0*  CL 103 100  CO2 26 32  GLUCOSE 105* 96  BUN 18 13  CREATININE 0.94 1.00    Recent Labs  09/24/13 0530 09/24/13 1106  TROPONINI <0.30 <0.30   Hepatic Function Panel  Recent Labs  09/24/13 0530  PROT 6.2  ALBUMIN 3.3*  AST 27  ALT 24  ALKPHOS 55  BILITOT 1.0    Recent Labs  09/24/13 0530  CHOL 121   No results found for this basename: PROTIME,  in the last 72 hours  Imaging: Dg Chest 2 View  09/23/2013   CLINICAL DATA:  Chest pain and shortness of breath  EXAM: CHEST  2 VIEW  COMPARISON:  None  FINDINGS: Cardiac shadow is enlarged. There is prominence of the central pulmonary vascularity identified. No focal infiltrate or sizable effusion is seen. There is fullness surrounding the left pulmonary artery. This is somewhat suspicious for a mass lesion but is not as well visualized on the lateral projection. No acute bony abnormality is noted.  IMPRESSION: Prominence of the central vascularity.  Prominent soft tissue density adjacent to the left pulmonary artery. CT of the chest is recommended for further evaluation to rule out an underlying mass.   Electronically  Signed   By: Alcide Clever M.D.   On: 09/23/2013 15:50   Ct Chest W Contrast  09/24/2013   CLINICAL DATA:  Prominent left hilar opacity noted on the current chest radiograph. Regional complaint was chest pain and shortness of breath.  EXAM: CT CHEST WITH CONTRAST  TECHNIQUE: Multidetector CT imaging of the chest was performed during intravenous contrast administration.  CONTRAST:  80mL OMNIPAQUE IOHEXOL 300 MG/ML  SOLN  COMPARISON:  Chest radiograph, 09/23/2013.  FINDINGS: There is consolidation in the left upper lobe anteriorly, anterior to the superior left hilum. There is associated mild mediastinal adenopathy. Reference matter was made of a prevascular node measuring 11 mm in short axis  and have right subcarinal node measuring 14 mm in short axis. The left upper lobe consolidation accounts for the chest radiographic opacity.  Elsewhere in the lungs there is a mosaic pattern of attenuation with geographic areas of relative lucency interspersed between areas of subtle increased lung attenuation. This suggests small airways disease.  No pleural effusion is seen.  The heart is mildly enlarged. There are mild to moderate coronary artery calcifications. The right pulmonary artery is dilated to 32 mm in the left dilated to 29 mm. The thoracic aorta is normal in caliber with no dissection.  Both adrenal glands are thickened with the thickened areas being of lower attenuation likely hyperplasia or adenomas. Evidence of a small hiatal hernia. The visualized upper abdomen is otherwise unremarkable.  There are degenerative changes of the visualized spine. No osteoblastic or osteolytic lesions.  IMPRESSION: 1. The opacity noted on the current chest radiograph is due to an area of left upper lobe consolidation, most likely pneumonia. There is associated mild mediastinal adenopathy. 2. In addition, there is also a mosaic pattern of lung attenuation. This is a nonspecific finding. It may reflect small airways disease. It could be from occlusive vascular disease with vascular shunting. The areas of higher attenuation could reflect subtle ground-glass infiltration from infection or inflammation. 3. Cardiomegaly with enlargement of the pulmonary arteries. This raises suspicion for pulmonary hypertension.   Electronically Signed   By: Amie Portland M.D.   On: 09/24/2013 10:17    Cardiac Studies: 2D echo 09/24/13: Study Conclusions  - Left ventricle: The cavity size was severely dilated. Wall thickness was normal. Systolic function was severely reduced. The estimated ejection fraction was in the range of 20% to 25%. Diffuse hypokinesis. Doppler parameters are consistent with high ventricular filling  pressure. - Mitral valve: Mild to moderate regurgitation. - Left atrium: The atrium was moderately dilated. - Right ventricle: The cavity size was mildly dilated. - Right atrium: The atrium was mildly dilated. - Pulmonary arteries: PA peak pressure: 31mm Hg (S). Impressions:  - Severe LV systolic dysfunction with global hypokinesis.  Assessment/Plan:  1. Atrial fibrillation. Rate better controlled. Will continue IV heparin . Will continue IV diltiazem through endoscopy this am and then stop diltiazem and start carvedilol. Consider cardioversion at a later date as outpatient after heart failure better controlled. 2. 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 Etiology not determined at this time. May need right and left heart cath this admission. Will ask heart failure team to see. Start BB and ACE 3. Severe GERD symptoms.  Having endoscopy this am. If endoscopy is negative we can proceed with oral anticoagulation. 4. Abnormal Chest xray.  CT raises question of possible LUL pneumonia. No fever or leucocytosis. No sputum production. Follow. 5. Abnormal EKG.  Widespread STT changes. Troponins negative.  Follow EKGs. 6. Hypokalemia.  Plan: Add carvedilol and lisinopril. Replete K.   Ask heart failure team to see.  LOS: 2 days    Kelli Webster 09/25/2013, 7:26 AM

## 2013-09-25 NOTE — Progress Notes (Signed)
ANTICOAGULATION CONSULT NOTE - Follow-Up  Pharmacy Consult for heparin Indication: atrial fibrillation  No Known Allergies  Patient Measurements: Height: 5\' 3"  (160 cm) Weight: 258 lb 11.2 oz (117.346 kg) IBW/kg (Calculated) : 52.4 Heparin Dosing Weight: 100kg  Vital Signs: Temp: 98.2 F (36.8 C) (10/08 0637) Temp src: Oral (10/08 0637) BP: 130/79 mmHg (10/08 0637) Pulse Rate: 89 (10/08 0637)  Labs:  Recent Labs  09/23/13 1510 09/23/13 1600 09/24/13 0150 09/24/13 0530 09/24/13 1100 09/24/13 1106 09/25/13 0430  HGB 13.5  --   --  12.5  --   --  12.3  HCT 42.6  --   --  39.4  --   --  37.9  PLT 429*  --   --  378  --   --  366  APTT  --  28  --   --   --   --   --   LABPROT  --  13.9  --   --   --   --   --   INR  --  1.09  --   --   --   --   --   HEPARINUNFRC  --   --  0.30  --  0.42  --  0.26*  CREATININE 0.89  --   --  0.94  --   --  1.00  TROPONINI  --   --  <0.30 <0.30  --  <0.30  --     Estimated Creatinine Clearance: 68.5 ml/min (by C-G formula based on Cr of 1).  Assessment: 67 year old female on heparin for afib. Heparin level 0.26 (subtherapeutic). No bleeding noted. CBC ok.  Goal of Therapy:  Heparin level 0.3-0.7 units/ml Monitor platelets by anticoagulation protocol: Yes   Plan:  Increase Heparin to 1750 units/hr when pt returns from Endo F/u 6 hr heparin level   Christoper Fabian, PharmD, BCPS Clinical pharmacist, pager [phone] 09/25/2013 8:45 AM

## 2013-09-25 NOTE — Consult Note (Signed)
Advanced Heart Failure Team Consult Note  Referring Physician: Dr Patty Sermons Primary Physician: Dr Collins Scotland Primary Cardiologist:  Dr Delton See?  Reason for Consultation: New Acute Systolic Heart Failure  HPI:    Kelli Webster is referred to the HF team by Dr Patty Sermons for new onset acute systolic heart failure.   Kelli Webster is a 67 year old with a history of GERD, obesity, and no prior cardiac history. She presented to the ED with with a month long history of GERD symptoms and found to be in atrial fibrillation with RVR.   About month prior to admit she had indigestion and tried nexium which caused nausea so she switched to Prilosec with ongoing nausea, and finally had some relief with tums and zantac. Her family says that she has been SOB with exertion and she is moving slowly to perform task around the house.  Short of breath walking up steps.She has been sleeping straight up in chair for several weeks. Husband says she snores.   Her symptoms again worsened and she developed abdominal bloating, orthopnea,cough, and lower extremity edema. She has also had 1-2 episodes of substernal chest pain. She presented to her PCP was noted to be tachycardic with an EKG that showed A-Fib. She was sent to the ED for further evaluation and later admitted. In the ED, EKG showed Afib RVR 130s. She received IV diltiazem and she was placed on heparin. Started on IV lasix for mild volume overload. ECHO completed and revealed EF 20-25%. GI consult obtained due to GERD symptoms.[name] lasix was increased to 40 mg IV bid. Weight down 10 pounds.   CXR- Prominence of the central vascularity. Prominent soft tissue density adjacent to the left pulmonary artery. CT of the chest is recommended for further evaluation to rule out an underlying mass.  CT of chest- Opacity  LUL Cardiomegaly with enlargement of the pulmonary arteries.Subtle ground-glass infiltration from infection or inflammation. ? Pulmonary HTN  ECHO:  09/24/13 EF 20-25%  RV mildly dilated Peak PA pressure 31  Labs: 09/24/13  TSH 1.19 Pro BNP 4997 MG 2.0 K 3.6 Creatinine 0.94 CEs negative, Hemoglobin 12.5.      Review of Systems: [y] = yes, [ ]  = no   General: Weight gain [Y ]; Weight loss [ ] ; Anorexia [ ] ; Fatigue [Y ]; Fever [ ] ; Chills [ ] ; Weakness [ ]   Cardiac: Chest pain/pressure [ ] ; Resting SOB [ ] ; Exertional SOB [Y ]; Orthopnea Gilian.Kraft ]; Pedal Edema [Y ]; Palpitations [ ] ; Syncope [ ] ; Presyncope [ ] ; Paroxysmal nocturnal dyspnea[ ]   Pulmonary: Cough [ Y]; Wheezing[ ] ; Hemoptysis[ ] ; Sputum [ ] ; Snoring [Y ]  GI: Vomiting[ ] ; Dysphagia[ ] ; Melena[ ] ; Hematochezia [ ] ; Heartburn[Y ]; Abdominal pain [ ] ; Constipation [ ] ; Diarrhea [ ] ; BRBPR [ ]   GU: Hematuria[ ] ; Dysuria [ ] ; Nocturia[ ]   Vascular: Pain in legs with walking [ ] ; Pain in feet with lying flat [ ] ; Non-healing sores [ ] ; Stroke [ ] ; TIA [ ] ; Slurred speech [ ] ;  Neuro: Headaches[ ] ; Vertigo[ ] ; Seizures[ ] ; Paresthesias[ ] ;Blurred vision [ ] ; Diplopia [ ] ; Vision changes [ ]   Ortho/Skin: Arthritis [Y ]; Joint pain [ ] ; Muscle pain [ ] ; Joint swelling [ ] ; Back Pain [ ] ; Rash [ ]   Psych: Depression[ ] ; Anxiety[ ]   Heme: Bleeding problems [ ] ; Clotting disorders [ ] ; Anemia [ ]   Endocrine: Diabetes [ ] ; Thyroid dysfunction[ ]   Home Medications Prior to Admission medications  Medication Sig Start Date End Date Taking? Authorizing Provider  aspirin 325 MG tablet Take 325 mg by mouth daily as needed for pain.   Yes Historical Provider, MD  BETAINE PO Take 2 capsules by mouth daily as needed (acid reflux).   Yes Historical Provider, MD  Calcium Carbonate Antacid (ANTACID PO) Take 1 tablet by mouth daily as needed (acid reflux).   Yes Historical Provider, MD  Digestive Enzymes (MULTI-ENZYME PO) Take 1 capsule by mouth daily as needed (acid reflux).   Yes Historical Provider, MD  esomeprazole (NEXIUM) 40 MG capsule Take 40 mg by mouth daily before breakfast.   Yes Historical Provider, MD   omeprazole (PRILOSEC) 20 MG capsule Take 20 mg by mouth daily.   Yes Historical Provider, MD  Ranitidine HCl (ZANTAC PO) Take 1 tablet by mouth 2 (two) times daily as needed (acid reflux).   Yes Historical Provider, MD    Past Medical History: Past Medical History  Diagnosis Date  . GERD (gastroesophageal reflux disease)   . Morbid obesity   . A-fib     a. Dx 09/2013  . Osteoarthritis     Past Surgical History: Past Surgical History  Procedure Laterality Date  . Joint replacement      Family History: Family History  Problem Relation Age of Onset  . Heart attack Father     died @ 20, multiple MI's  . Other Mother     alive and well in her 56's.  . Other Brother     A & W  . Other Sister     A & W  . Other Sister     A & W    Social History: History   Social History  . Marital Status: Married    Spouse Name: N/A    Number of Children: N/A  . Years of Education: N/A   Social History Main Topics  . Smoking status: Never Smoker   . Smokeless tobacco: None  . Alcohol Use: No  . Drug Use: No  . Sexual Activity: None   Other Topics Concern  . None   Social History Narrative   Lives in [place] with husband.  Does not routinely exercise.   Marland Kitchen aspirin EC  81 mg Oral Daily  . carvedilol  6.25 mg Oral BID WC  . digoxin  0.125 mg Oral Daily  . famotidine  20 mg Oral BID  . furosemide  40 mg Oral BID  . lisinopril  5 mg Oral Daily  . potassium chloride  20 mEq Oral Daily  . sodium chloride  3 mL Intravenous Q12H  . spironolactone  12.5 mg Oral Daily   Allergies:  No Known Allergies  Objective:    Vital Signs:   Temp:  [97.8 F (36.6 C)-98.5 F (36.9 C)] 97.8 F (36.6 C) (10/08 0929) Pulse Rate:  [45-110] 45 (10/08 0959) Resp:  [10-33] 19 (10/08 0959) BP: (106-147)/(31-97) 114/62 mmHg (10/08 0959) SpO2:  [93 %-99 %] 97 % (10/08 0959) Weight:  [258 lb 11.2 oz (117.346 kg)] 258 lb 11.2 oz (117.346 kg) (10/08 0637) Last BM Date: 09/24/13  Weight  change: Filed Weights   09/23/13 2237 09/24/13 0500 09/25/13 0637  Weight: 268 lb 1.6 oz (121.609 kg) 268 lb (121.564 kg) 258 lb 11.2 oz (117.346 kg)    Intake/Output:   Intake/Output Summary (Last 24 hours) at 09/25/13 1149 Last data filed at 09/25/13 0918  Gross per 24 hour  Intake     75 ml  Output   2350 ml  Net  -2275 ml     Physical Exam: General:  Well appearing. No resp difficulty HEENT: normal Neck: supple. JVP ~10. Carotids 2+ bilat; no bruits. No lymphadenopathy or thryomegaly appreciated. Cor: PMI nondisplaced. Irregular rate & rhythm. No rubs, gallops or murmurs. Lungs: clear Abdomen: obesity, soft, nontender, nondistended. No hepatosplenomegaly. No bruits or masses. Good bowel sounds. Extremities: no cyanosis, clubbing, rash, R and LLE 2+edema Nuro: alert & orientedx3, cranial nerves grossly intact. moves all 4 extremities w/o difficulty. Affect pleasant  Telemetry: A-fib 80s  Labs: Basic Metabolic Panel:  Recent Labs Lab 09/23/13 1510 09/24/13 0150 09/24/13 0530 09/24/13 2025 09/25/13 0430  NA 139  --  143  --  143  K 4.3  --  3.6  --  3.0*  CL 102  --  103  --  100  CO2 23  --  26  --  32  GLUCOSE 139*  --  105*  --  96  BUN 23  --  18  --  13  CREATININE 0.89  --  0.94  --  1.00  CALCIUM 8.9  --  8.5  --  8.5  MG  --  2.0  --  1.7  --     Liver Function Tests:  Recent Labs Lab 09/24/13 0530  AST 27  ALT 24  ALKPHOS 55  BILITOT 1.0  PROT 6.2  ALBUMIN 3.3*   Lipid Panel     Component Value Date/Time   CHOL 121 09/24/2013 0530   TRIG 72 09/24/2013 0530   HDL 45 09/24/2013 0530   CHOLHDL 2.7 09/24/2013 0530   VLDL 14 09/24/2013 0530   LDLCALC 62 09/24/2013 0530     No results found for this basename: LIPASE, AMYLASE,  in the last 168 hours No results found for this basename: AMMONIA,  in the last 168 hours  CBC:  Recent Labs Lab 09/23/13 1510 09/24/13 0530 09/25/13 0430  WBC 8.6 7.4 7.6  HGB 13.5 12.5 12.3  HCT 42.6 39.4 37.9   MCV 84.2 84.9 84.6  PLT 429* 378 366    Cardiac Enzymes:  Recent Labs Lab 09/24/13 0150 09/24/13 0530 09/24/13 1106  TROPONINI <0.30 <0.30 <0.30    BNP: BNP (last 3 results)  Recent Labs  09/24/13 0150  PROBNP 4997.0*    CBG: No results found for this basename: GLUCAP,  in the last 168 hours  Coagulation Studies:  Recent Labs  09/23/13 1600  LABPROT 13.9  INR 1.09    Other results: EKG: atrial fibrillation with RVR  Imaging: Dg Chest 2 View  09/23/2013   CLINICAL DATA:  Chest pain and shortness of breath  EXAM: CHEST  2 VIEW  COMPARISON:  None  FINDINGS: Cardiac shadow is enlarged. There is prominence of the central pulmonary vascularity identified. No focal infiltrate or sizable effusion is seen. There is fullness surrounding the left pulmonary artery. This is somewhat suspicious for a mass lesion but is not as well visualized on the lateral projection. No acute bony abnormality is noted.  IMPRESSION: Prominence of the central vascularity.  Prominent soft tissue density adjacent to the left pulmonary artery. CT of the chest is recommended for further evaluation to rule out an underlying mass.   Electronically Signed   By: Alcide Clever M.D.   On: 09/23/2013 15:50   Ct Chest W Contrast  09/24/2013   CLINICAL DATA:  Prominent left hilar opacity noted on the current chest radiograph. Regional complaint was  chest pain and shortness of breath.  EXAM: CT CHEST WITH CONTRAST  TECHNIQUE: Multidetector CT imaging of the chest was performed during intravenous contrast administration.  CONTRAST:  80mL OMNIPAQUE IOHEXOL 300 MG/ML  SOLN  COMPARISON:  Chest radiograph, 09/23/2013.  FINDINGS: There is consolidation in the left upper lobe anteriorly, anterior to the superior left hilum. There is associated mild mediastinal adenopathy. Reference matter was made of a prevascular node measuring 11 mm in short axis and have right subcarinal node measuring 14 mm in short axis. The left upper  lobe consolidation accounts for the chest radiographic opacity.  Elsewhere in the lungs there is a mosaic pattern of attenuation with geographic areas of relative lucency interspersed between areas of subtle increased lung attenuation. This suggests small airways disease.  No pleural effusion is seen.  The heart is mildly enlarged. There are mild to moderate coronary artery calcifications. The right pulmonary artery is dilated to 32 mm in the left dilated to 29 mm. The thoracic aorta is normal in caliber with no dissection.  Both adrenal glands are thickened with the thickened areas being of lower attenuation likely hyperplasia or adenomas. Evidence of a small hiatal hernia. The visualized upper abdomen is otherwise unremarkable.  There are degenerative changes of the visualized spine. No osteoblastic or osteolytic lesions.  IMPRESSION: 1. The opacity noted on the current chest radiograph is due to an area of left upper lobe consolidation, most likely pneumonia. There is associated mild mediastinal adenopathy. 2. In addition, there is also a mosaic pattern of lung attenuation. This is a nonspecific finding. It may reflect small airways disease. It could be from occlusive vascular disease with vascular shunting. The areas of higher attenuation could reflect subtle ground-glass infiltration from infection or inflammation. 3. Cardiomegaly with enlargement of the pulmonary arteries. This raises suspicion for pulmonary hypertension.   Electronically Signed   By: Amie Portland M.D.   On: 09/24/2013 10:17     Medications:     Current Medications: . aspirin EC  81 mg Oral Daily  . carvedilol  6.25 mg Oral BID WC  . digoxin  0.125 mg Oral Daily  . famotidine  20 mg Oral BID  . furosemide  40 mg Oral BID  . lisinopril  5 mg Oral Daily  . potassium chloride  20 mEq Oral Daily  . sodium chloride  3 mL Intravenous Q12H  . spironolactone  12.5 mg Oral Daily    Infusions: . heparin 1,750 Units/hr (09/25/13  1055)     Assessment/Plan:  Kelli Webster is a 67 year old admitted with Afib RVR and severe LV dysfunction EF 20-25% with no prior cardiac history. NYHA III symptoms over the last month.   1.  Acute Systolic Heart Failure- ECHO 10/7 EF 20-25% with unknown etiology. 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  Alternatively, she could have developed atrial fibrillation in the setting of a cardiomyopathy from another etiology.  Pro BNP 4997. TSH 1.1 CBC and BMET ok.  Will need to check SPEP/UPEP.  Mild volume overload noted. Continue lasix 40 mg IV bid today, possibly transition to po tomorrow. Continue carvedilol 6.25 mg bid and lisinopril 5 mg daily. Add digoxin 0.125 mg daily and spironolactone 12.5 mg daily.  Will need LHC/RHC to assess coronaries and hemodynamics once she is adequately diuresed. Plan for cath tentatively tomorrow morning.  2. A-Fib RVR: On heparin gtt. Diltiazem stopped this am  due to reduced EF. Carvedilol started, will also add digoxin.  Continue heparin gtt for now, will start apixaban 5 mg bid after catheterization.  Given concern for tachycardia-mediated cardiomyopathy, she will need TEE-guided DCCV after an appropriate interval on apixaban (5+ doses).   3. GERD: GI consulted EGD today 4. NSVT: replace K Magnesium 1.7.  5 Hypokalemia: replace K- add 12.5 mg spironolactone.  6. Snores: Will need assessment for OSA as outpatient.   [name] 09/25/2013 1:15 PM   Length of Stay: 2   Advanced Heart Failure Team Pager [phone] (M-F; 7a - 4p)  Please contact [facility] for night-coverage after hours (4p -7a ) and weekends on [email or web address]

## 2013-09-25 NOTE — Plan of Care (Signed)
Problem: Food- and Nutrition-Related Knowledge Deficit (NB-1.1) Goal: Nutrition education Formal process to instruct or train a patient/client in a skill or to impart knowledge to help patients/clients voluntarily manage or modify food choices and eating behavior to maintain or improve health. Outcome: Completed/Met Date Met:  09/25/13  RD consulted for nutrition education regarding new onset CHF.  RD provided "Heart Failure Nutrition Therapy" handout from the [employer]. Reviewed patient's dietary recall. Provided examples on ways to decrease sodium intake in diet. Discouraged intake of processed foods and use of salt shaker. Encouraged fresh fruits and vegetables as well as whole grain sources of carbohydrates to maximize fiber intake. Teach back method used.  Expect good compliance.  Body mass index is 45.84 kg/(m^2). Pt meets criteria for Obesity Class III based on current BMI.  Current diet order is Heart Healthy; reports a good appetite. Labs and medications reviewed. No further nutrition interventions warranted at this time. If additional nutrition issues arise, please re-consult RD.   Maureen Chatters, RD, LDN Pager #: [phone] After-Hours Pager #: [phone]

## 2013-09-26 ENCOUNTER — Encounter (HOSPITAL_COMMUNITY): Payer: Self-pay | Admitting: Gastroenterology

## 2013-09-26 ENCOUNTER — Encounter (HOSPITAL_COMMUNITY)
Admission: EM | Disposition: A | Discharge status: Home / Self Care | Payer: Self-pay | Source: Home / Self Care | Attending: Cardiology

## 2013-09-26 DIAGNOSIS — I2510 Atherosclerotic heart disease of native coronary artery without angina pectoris: Secondary | ICD-10-CM

## 2013-09-26 HISTORY — PX: LEFT HEART CATHETERIZATION WITH CORONARY ANGIOGRAM: SHX5451

## 2013-09-26 LAB — BASIC METABOLIC PANEL
BUN: 16 mg/dL (ref 6–23)
CO2: 32 mEq/L (ref 19–32)
Calcium: 8.7 mg/dL (ref 8.4–10.5)
Chloride: 100 mEq/L (ref 96–112)
Creatinine, Ser: 1.09 mg/dL (ref 0.50–1.10)
GFR calc Af Amer: 60 mL/min — ABNORMAL LOW (ref >90)
GFR calc non Af Amer: 52 mL/min — ABNORMAL LOW (ref >90)
Glucose, Bld: 116 mg/dL — ABNORMAL HIGH (ref 70–99)
Potassium: 4 mEq/L (ref 3.5–5.1)
Sodium: 143 mEq/L (ref 135–145)

## 2013-09-26 LAB — MAGNESIUM: Magnesium: 1.8 mg/dL (ref 1.5–2.5)

## 2013-09-26 LAB — HIV ANTIBODY (ROUTINE TESTING W REFLEX): HIV: NONREACTIVE

## 2013-09-26 LAB — HEPARIN LEVEL (UNFRACTIONATED): Heparin Unfractionated: 0.96 IU/mL — ABNORMAL HIGH (ref 0.30–0.70)

## 2013-09-26 SURGERY — LEFT HEART CATHETERIZATION WITH CORONARY ANGIOGRAM

## 2013-09-26 MED ORDER — SODIUM CHLORIDE 0.9 % IJ SOLN: 3.0000 mL | INTRAMUSCULAR | Status: DC | PRN

## 2013-09-26 MED ORDER — VERAPAMIL HCL 2.5 MG/ML IV SOLN
INTRAVENOUS | Status: AC
  Filled 2013-09-26: qty 2

## 2013-09-26 MED ORDER — SODIUM CHLORIDE 0.9 % IJ SOLN: 3.0000 mL | Freq: Two times a day (BID) | INTRAMUSCULAR | Status: DC

## 2013-09-26 MED ORDER — ASPIRIN 81 MG PO CHEW
81.0000 mg | CHEWABLE_TABLET | ORAL | Status: AC
  Administered 2013-09-26: 81 mg via ORAL
  Filled 2013-09-26: qty 1

## 2013-09-26 MED ORDER — LIDOCAINE HCL (PF) 1 % IJ SOLN
INTRAMUSCULAR | Status: AC
  Filled 2013-09-26: qty 30

## 2013-09-26 MED ORDER — CARVEDILOL 6.25 MG PO TABS
6.2500 mg | ORAL_TABLET | Freq: Once | ORAL | Status: AC
  Administered 2013-09-26: 6.25 mg via ORAL
  Filled 2013-09-26: qty 1

## 2013-09-26 MED ORDER — SPIRONOLACTONE 25 MG PO TABS
25.0000 mg | ORAL_TABLET | Freq: Every day | ORAL | Status: DC
  Administered 2013-09-26 – 2013-09-27 (×2): 25 mg via ORAL
  Filled 2013-09-26 (×2): qty 1

## 2013-09-26 MED ORDER — DIAZEPAM 2 MG PO TABS
2.0000 mg | ORAL_TABLET | ORAL | Status: AC
  Administered 2013-09-26: 2 mg via ORAL
  Filled 2013-09-26: qty 1

## 2013-09-26 MED ORDER — SODIUM CHLORIDE 0.9 % IV SOLN: INTRAVENOUS | Status: DC

## 2013-09-26 MED ORDER — ACETAMINOPHEN 325 MG PO TABS
650.0000 mg | ORAL_TABLET | ORAL | Status: DC | PRN
  Administered 2013-09-26: 650 mg via ORAL
  Filled 2013-09-26: qty 2

## 2013-09-26 MED ORDER — HEPARIN (PORCINE) IN NACL 2-0.9 UNIT/ML-% IJ SOLN
INTRAMUSCULAR | Status: AC
  Filled 2013-09-26: qty 1000

## 2013-09-26 MED ORDER — SODIUM CHLORIDE 0.9 % IV SOLN: 250.0000 mL | INTRAVENOUS | Status: DC | PRN

## 2013-09-26 MED ORDER — FENTANYL CITRATE 0.05 MG/ML IJ SOLN
INTRAMUSCULAR | Status: AC
  Filled 2013-09-26: qty 2

## 2013-09-26 MED ORDER — APIXABAN 5 MG PO TABS
5.0000 mg | ORAL_TABLET | Freq: Two times a day (BID) | ORAL | Status: DC
  Administered 2013-09-26 – 2013-09-27 (×2): 5 mg via ORAL
  Filled 2013-09-26 (×3): qty 1

## 2013-09-26 MED ORDER — MIDAZOLAM HCL 2 MG/2ML IJ SOLN
INTRAMUSCULAR | Status: AC
  Filled 2013-09-26: qty 2

## 2013-09-26 MED ORDER — POTASSIUM CHLORIDE 20 MEQ/15ML (10%) PO LIQD
ORAL | Status: AC
  Filled 2013-09-26: qty 30

## 2013-09-26 MED ORDER — POTASSIUM CHLORIDE 20 MEQ/15ML (10%) PO LIQD
40.0000 meq | Freq: Once | ORAL | Status: AC
  Administered 2013-09-26: 40 meq via ORAL
  Filled 2013-09-26: qty 30

## 2013-09-26 MED ORDER — HEPARIN SODIUM (PORCINE) 1000 UNIT/ML IJ SOLN
INTRAMUSCULAR | Status: AC
  Filled 2013-09-26: qty 1

## 2013-09-26 MED ORDER — POTASSIUM CHLORIDE CRYS ER 20 MEQ PO TBCR: 40.0000 meq | EXTENDED_RELEASE_TABLET | Freq: Once | ORAL | Status: AC

## 2013-09-26 MED ORDER — BENZONATATE 100 MG PO CAPS
200.0000 mg | ORAL_CAPSULE | Freq: Two times a day (BID) | ORAL | Status: DC
  Administered 2013-09-26 – 2013-09-27 (×2): 200 mg via ORAL
  Filled 2013-09-26 (×3): qty 2

## 2013-09-26 MED ORDER — POTASSIUM CHLORIDE CRYS ER 20 MEQ PO TBCR
20.0000 meq | EXTENDED_RELEASE_TABLET | Freq: Every day | ORAL | Status: DC
  Administered 2013-09-26 – 2013-09-27 (×2): 20 meq via ORAL
  Filled 2013-09-26: qty 1

## 2013-09-26 MED ORDER — ONDANSETRON HCL 4 MG/2ML IJ SOLN: 4.0000 mg | Freq: Four times a day (QID) | INTRAMUSCULAR | Status: DC | PRN

## 2013-09-26 MED ORDER — CARVEDILOL 12.5 MG PO TABS
12.5000 mg | ORAL_TABLET | Freq: Two times a day (BID) | ORAL | Status: DC
  Administered 2013-09-26 – 2013-09-27 (×2): 12.5 mg via ORAL
  Filled 2013-09-26 (×4): qty 1

## 2013-09-26 MED ORDER — SODIUM CHLORIDE 0.9 % IJ SOLN
3.0000 mL | Freq: Two times a day (BID) | INTRAMUSCULAR | Status: DC
  Administered 2013-09-26: 3 mL via INTRAVENOUS

## 2013-09-26 NOTE — CV Procedure (Signed)
    Cardiac Catheterization Procedure Note  Name: Kelli Webster MRN: [id number] DOB: 05-14-1946  Procedure: Left Heart Cath, Selective Coronary Angiography  Indication: Acute systolic CHF, cardiomyopathy   Procedural Details: Allen's test was negative on the right wrist.  The right wrist was prepped, draped, and anesthetized with 1% lidocaine. Using the modified Seldinger technique, a 5 French sheath was introduced into the right radial artery. 3 mg of verapamil was administered through the sheath, weight-based unfractionated heparin was administered intravenously. Standard Judkins catheters were used for selective coronary angiography. Catheter exchanges were performed over an exchange length guidewire. There were no immediate procedural complications. A TR band was used for radial hemostasis at the completion of the procedure.  The patient was transferred to the post catheterization recovery area for further monitoring.  Procedural Findings: Hemodynamics: AO 133/88 LV 137/17  Coronary angiography: Coronary dominance: right  Left mainstem: No significant disease.   Left anterior descending (LAD): 30% proximal LAD stenosis.   Left circumflex (LCx): Mild luminal irregularities.   Right coronary artery (RCA): Mild luminal irregularities.  Left ventriculography: Not done, recent echo.   Final Conclusions:  Minimal coronary disease.  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  LVEDP is only mildly elevated.  - Continue IV Lasix today, convert to po tomorrow.  - HR a bit elevated, increase Coreg to 12.5 mg bid.  - Continue lisinopril at current dose and increase spironolactone to 25 mg daily (hypokalemia).  - Start apixaban tonight.  After 6 doses of apixaban, will plan TEE-guided cardioversion.  If patient is stable tomorrow, may send home and have her scheduled Monday as outpatient for the cardioversion.   [name] 09/26/2013, 8:54 AM

## 2013-09-26 NOTE — Progress Notes (Signed)
ANTICOAGULATION CONSULT NOTE - Follow Up Consult  Pharmacy Consult for Apixiban Indication: atrial fibrillation  No Known Allergies  Patient Measurements: Height: 5\' 3"  (160 cm) Weight: 255 lb 3.2 oz (115.758 kg) IBW/kg (Calculated) : 52.4   Vital Signs: Temp: 97.6 F (36.4 C) (10/09 0528) Temp src: Oral (10/09 0528) BP: 106/70 mmHg (10/09 1100) Pulse Rate: 81 (10/09 1100)  Labs:  Recent Labs  09/23/13 1510 09/23/13 1600 09/24/13 0150 09/24/13 0530  09/24/13 1106 09/25/13 0430 09/25/13 1617 09/25/13 2340  HGB 13.5  --   --  12.5  --   --  12.3  --   --   HCT 42.6  --   --  39.4  --   --  37.9  --   --   PLT 429*  --   --  378  --   --  366  --   --   APTT  --  28  --   --   --   --   --   --   --   LABPROT  --  13.9  --   --   --   --   --   --   --   INR  --  1.09  --   --   --   --   --   --   --   HEPARINUNFRC  --   --  0.30  --   < >  --  0.26* 0.62 0.96*  CREATININE 0.89  --   --  0.94  --   --  1.00  --   --   TROPONINI  --   --  <0.30 <0.30  --  <0.30  --   --   --   < > = values in this interval not displayed.  Estimated Creatinine Clearance: 68 ml/min (by C-G formula based on Cr of 1).    Assessment: 6 yof admitted with Afib RVR now CVR 80s s/p diltiazem drip.  Possible TEE/DCCV next week.  ECHO shows new CM EF 20% started on coreg, digoxin, lisinopril, spironolactone and furosemide.  Cath shows non-obstructive CAD.   Plan to begin apixiban for anticoagulation 8hr after sheath pulled - out at 0900 and TR band removed at 1200 - with some oozing and hematoma.  Pressure applied and oozing stopped.  Will begin apixiban tonight > 8hr post cath d/t oozing.    Goal of Therapy:   Monitor platelets by anticoagulation protocol: Yes   Plan:  Apixiban 5mg  BID - begin with dose tonight - > 8hr post sheath pulled and TR band removed Monitor s/s bleeding  CBC, BMET  Leota Sauers Pharm.D. CPP, BCPS Clinical Pharmacist [phone] 09/26/2013 11:34 AM

## 2013-09-26 NOTE — H&P (View-Only) (Signed)
Advanced Heart Failure Team Consult Note  Referring Physician: Dr Brackbill Primary Physician: Dr Spear Primary Cardiologist:  Dr Nelson?  Reason for Consultation: New Acute Systolic Heart Failure  HPI:    Kelli Webster is referred to the HF team by Dr Brackbill for new onset acute systolic heart failure.   Kelli Webster is a 66 year old with a history of GERD, obesity, and no prior cardiac history. She presented to the ED with with a month long history of GERD symptoms and found to be in atrial fibrillation with RVR.   About month prior to admit she had indigestion and tried nexium which caused nausea so she switched to Prilosec with ongoing nausea, and finally had some relief with tums and zantac. Her family says that she has been SOB with exertion and she is moving slowly to perform task around the house.  Short of breath walking up steps.She has been sleeping straight up in chair for several weeks. Husband says she snores.   Her symptoms again worsened and she developed abdominal bloating, orthopnea,cough, and lower extremity edema. She has also had 1-2 episodes of substernal chest pain. She presented to her PCP was noted to be tachycardic with an EKG that showed A-Fib. She was sent to the ED for further evaluation and later admitted. In the ED, EKG showed Afib RVR 130s. She received IV diltiazem and she was placed on heparin. Started on IV lasix for mild volume overload. ECHO completed and revealed EF 20-25%. GI consult obtained due to GERD symptoms.[place] lasix was increased to 40 mg IV bid. Weight down 10 pounds.   CXR- Prominence of the central vascularity. Prominent soft tissue density adjacent to the left pulmonary artery. CT of the chest is recommended for further evaluation to rule out an underlying mass.  CT of chest- Opacity  LUL Cardiomegaly with enlargement of the pulmonary arteries.Subtle ground-glass infiltration from infection or inflammation. ? Pulmonary HTN  ECHO:  09/24/13 EF 20-25%  RV mildly dilated Peak PA pressure 31  Labs: 09/24/13  TSH 1.19 Pro BNP 4997 MG 2.0 K 3.6 Creatinine 0.94 CEs negative, Hemoglobin 12.5.      Review of Systems: [y] = yes, [ ] = no   General: Weight gain [Y ]; Weight loss [ ]; Anorexia [ ]; Fatigue [Y ]; Fever [ ]; Chills [ ]; Weakness [ ]  Cardiac: Chest pain/pressure [ ]; Resting SOB [ ]; Exertional SOB [Y ]; Orthopnea [Y ]; Pedal Edema [Y ]; Palpitations [ ]; Syncope [ ]; Presyncope [ ]; Paroxysmal nocturnal dyspnea[ ]  Pulmonary: Cough [ Y]; Wheezing[ ]; Hemoptysis[ ]; Sputum [ ]; Snoring [Y ]  GI: Vomiting[ ]; Dysphagia[ ]; Melena[ ]; Hematochezia [ ]; Heartburn[Y ]; Abdominal pain [ ]; Constipation [ ]; Diarrhea [ ]; BRBPR [ ]  GU: Hematuria[ ]; Dysuria [ ]; Nocturia[ ]  Vascular: Pain in legs with walking [ ]; Pain in feet with lying flat [ ]; Non-healing sores [ ]; Stroke [ ]; TIA [ ]; Slurred speech [ ];  Neuro: Headaches[ ]; Vertigo[ ]; Seizures[ ]; Paresthesias[ ];Blurred vision [ ]; Diplopia [ ]; Vision changes [ ]  Ortho/Skin: Arthritis [Y ]; Joint pain [ ]; Muscle pain [ ]; Joint swelling [ ]; Back Pain [ ]; Rash [ ]  Psych: Depression[ ]; Anxiety[ ]  Heme: Bleeding problems [ ]; Clotting disorders [ ]; Anemia [ ]  Endocrine: Diabetes [ ]; Thyroid dysfunction[ ]  Home Medications Prior to Admission medications     Medication Sig Start Date End Date Taking? Authorizing Provider  aspirin 325 MG tablet Take 325 mg by mouth daily as needed for pain.   Yes Historical Provider, MD  BETAINE PO Take 2 capsules by mouth daily as needed (acid reflux).   Yes Historical Provider, MD  Calcium Carbonate Antacid (ANTACID PO) Take 1 tablet by mouth daily as needed (acid reflux).   Yes Historical Provider, MD  Digestive Enzymes (MULTI-ENZYME PO) Take 1 capsule by mouth daily as needed (acid reflux).   Yes Historical Provider, MD  esomeprazole (NEXIUM) 40 MG capsule Take 40 mg by mouth daily before breakfast.   Yes Historical Provider, MD   omeprazole (PRILOSEC) 20 MG capsule Take 20 mg by mouth daily.   Yes Historical Provider, MD  Ranitidine HCl (ZANTAC PO) Take 1 tablet by mouth 2 (two) times daily as needed (acid reflux).   Yes Historical Provider, MD    Past Medical History: Past Medical History  Diagnosis Date  . GERD (gastroesophageal reflux disease)   . Morbid obesity   . A-fib     a. Dx 09/2013  . Osteoarthritis     Past Surgical History: Past Surgical History  Procedure Laterality Date  . Joint replacement      Family History: Family History  Problem Relation Age of Onset  . Heart attack Father     died @ 57, multiple MI's  . Other Mother     alive and well in her 80's.  . Other Brother     A & W  . Other Sister     A & W  . Other Sister     A & W    Social History: History   Social History  . Marital Status: Married    Spouse Name: N/A    Number of Children: N/A  . Years of Education: N/A   Social History Main Topics  . Smoking status: Never Smoker   . Smokeless tobacco: None  . Alcohol Use: No  . Drug Use: No  . Sexual Activity: None   Other Topics Concern  . None   Social History Narrative   Lives in [place] with husband.  Does not routinely exercise.   . aspirin EC  81 mg Oral Daily  . carvedilol  6.25 mg Oral BID WC  . digoxin  0.125 mg Oral Daily  . famotidine  20 mg Oral BID  . furosemide  40 mg Oral BID  . lisinopril  5 mg Oral Daily  . potassium chloride  20 mEq Oral Daily  . sodium chloride  3 mL Intravenous Q12H  . spironolactone  12.5 mg Oral Daily   Allergies:  No Known Allergies  Objective:    Vital Signs:   Temp:  [97.8 F (36.6 C)-98.5 F (36.9 C)] 97.8 F (36.6 C) (10/08 0929) Pulse Rate:  [45-110] 45 (10/08 0959) Resp:  [10-33] 19 (10/08 0959) BP: (106-147)/(31-97) 114/62 mmHg (10/08 0959) SpO2:  [93 %-99 %] 97 % (10/08 0959) Weight:  [258 lb 11.2 oz (117.346 kg)] 258 lb 11.2 oz (117.346 kg) (10/08 0637) Last BM Date: 09/24/13  Weight  change: Filed Weights   09/23/13 2237 09/24/13 0500 09/25/13 0637  Weight: 268 lb 1.6 oz (121.609 kg) 268 lb (121.564 kg) 258 lb 11.2 oz (117.346 kg)    Intake/Output:   Intake/Output Summary (Last 24 hours) at 09/25/13 1149 Last data filed at 09/25/13 0918  Gross per 24 hour  Intake     75 ml    Output   2350 ml  Net  -2275 ml     Physical Exam: General:  Well appearing. No resp difficulty HEENT: normal Neck: supple. JVP ~10. Carotids 2+ bilat; no bruits. No lymphadenopathy or thryomegaly appreciated. Cor: PMI nondisplaced. Irregular rate & rhythm. No rubs, gallops or murmurs. Lungs: clear Abdomen: obesity, soft, nontender, nondistended. No hepatosplenomegaly. No bruits or masses. Good bowel sounds. Extremities: no cyanosis, clubbing, rash, R and LLE 2+edema Nuro: alert & orientedx3, cranial nerves grossly intact. moves all 4 extremities w/o difficulty. Affect pleasant  Telemetry: A-fib 80s  Labs: Basic Metabolic Panel:  Recent Labs Lab 09/23/13 1510 09/24/13 0150 09/24/13 0530 09/24/13 2025 09/25/13 0430  NA 139  --  143  --  143  K 4.3  --  3.6  --  3.0*  CL 102  --  103  --  100  CO2 23  --  26  --  32  GLUCOSE 139*  --  105*  --  96  BUN 23  --  18  --  13  CREATININE 0.89  --  0.94  --  1.00  CALCIUM 8.9  --  8.5  --  8.5  MG  --  2.0  --  1.7  --     Liver Function Tests:  Recent Labs Lab 09/24/13 0530  AST 27  ALT 24  ALKPHOS 55  BILITOT 1.0  PROT 6.2  ALBUMIN 3.3*   Lipid Panel     Component Value Date/Time   CHOL 121 09/24/2013 0530   TRIG 72 09/24/2013 0530   HDL 45 09/24/2013 0530   CHOLHDL 2.7 09/24/2013 0530   VLDL 14 09/24/2013 0530   LDLCALC 62 09/24/2013 0530     No results found for this basename: LIPASE, AMYLASE,  in the last 168 hours No results found for this basename: AMMONIA,  in the last 168 hours  CBC:  Recent Labs Lab 09/23/13 1510 09/24/13 0530 09/25/13 0430  WBC 8.6 7.4 7.6  HGB 13.5 12.5 12.3  HCT 42.6 39.4 37.9   MCV 84.2 84.9 84.6  PLT 429* 378 366    Cardiac Enzymes:  Recent Labs Lab 09/24/13 0150 09/24/13 0530 09/24/13 1106  TROPONINI <0.30 <0.30 <0.30    BNP: BNP (last 3 results)  Recent Labs  09/24/13 0150  PROBNP 4997.0*    CBG: No results found for this basename: GLUCAP,  in the last 168 hours  Coagulation Studies:  Recent Labs  09/23/13 1600  LABPROT 13.9  INR 1.09    Other results: EKG: atrial fibrillation with RVR  Imaging: Dg Chest 2 View  09/23/2013   CLINICAL DATA:  Chest pain and shortness of breath  EXAM: CHEST  2 VIEW  COMPARISON:  None  FINDINGS: Cardiac shadow is enlarged. There is prominence of the central pulmonary vascularity identified. No focal infiltrate or sizable effusion is seen. There is fullness surrounding the left pulmonary artery. This is somewhat suspicious for a mass lesion but is not as well visualized on the lateral projection. No acute bony abnormality is noted.  IMPRESSION: Prominence of the central vascularity.  Prominent soft tissue density adjacent to the left pulmonary artery. CT of the chest is recommended for further evaluation to rule out an underlying mass.   Electronically Signed   By: Mark  Lukens M.D.   On: 09/23/2013 15:50   Ct Chest W Contrast  09/24/2013   CLINICAL DATA:  Prominent left hilar opacity noted on the current chest radiograph. Regional complaint was   chest pain and shortness of breath.  EXAM: CT CHEST WITH CONTRAST  TECHNIQUE: Multidetector CT imaging of the chest was performed during intravenous contrast administration.  CONTRAST:  80mL OMNIPAQUE IOHEXOL 300 MG/ML  SOLN  COMPARISON:  Chest radiograph, 09/23/2013.  FINDINGS: There is consolidation in the left upper lobe anteriorly, anterior to the superior left hilum. There is associated mild mediastinal adenopathy. Reference matter was made of a prevascular node measuring 11 mm in short axis and have right subcarinal node measuring 14 mm in short axis. The left upper  lobe consolidation accounts for the chest radiographic opacity.  Elsewhere in the lungs there is a mosaic pattern of attenuation with geographic areas of relative lucency interspersed between areas of subtle increased lung attenuation. This suggests small airways disease.  No pleural effusion is seen.  The heart is mildly enlarged. There are mild to moderate coronary artery calcifications. The right pulmonary artery is dilated to 32 mm in the left dilated to 29 mm. The thoracic aorta is normal in caliber with no dissection.  Both adrenal glands are thickened with the thickened areas being of lower attenuation likely hyperplasia or adenomas. Evidence of a small hiatal hernia. The visualized upper abdomen is otherwise unremarkable.  There are degenerative changes of the visualized spine. No osteoblastic or osteolytic lesions.  IMPRESSION: 1. The opacity noted on the current chest radiograph is due to an area of left upper lobe consolidation, most likely pneumonia. There is associated mild mediastinal adenopathy. 2. In addition, there is also a mosaic pattern of lung attenuation. This is a nonspecific finding. It may reflect small airways disease. It could be from occlusive vascular disease with vascular shunting. The areas of higher attenuation could reflect subtle ground-glass infiltration from infection or inflammation. 3. Cardiomegaly with enlargement of the pulmonary arteries. This raises suspicion for pulmonary hypertension.   Electronically Signed   By: David  Ormond M.D.   On: 09/24/2013 10:17     Medications:     Current Medications: . aspirin EC  81 mg Oral Daily  . carvedilol  6.25 mg Oral BID WC  . digoxin  0.125 mg Oral Daily  . famotidine  20 mg Oral BID  . furosemide  40 mg Oral BID  . lisinopril  5 mg Oral Daily  . potassium chloride  20 mEq Oral Daily  . sodium chloride  3 mL Intravenous Q12H  . spironolactone  12.5 mg Oral Daily    Infusions: . heparin 1,750 Units/hr (09/25/13  1055)     Assessment/Plan:  Kelli Webster is a 66 year old admitted with Afib RVR and severe LV dysfunction EF 20-25% with no prior cardiac history. NYHA III symptoms over the last month.   1.  Acute Systolic Heart Failure- ECHO 10/7 EF 20-25% with unknown etiology. 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  Alternatively, she could have developed atrial fibrillation in the setting of a cardiomyopathy from another etiology.  Pro BNP 4997. TSH 1.1 CBC and BMET ok.  Will need to check SPEP/UPEP.  Mild volume overload noted. Continue lasix 40 mg IV bid today, possibly transition to po tomorrow. Continue carvedilol 6.25 mg bid and lisinopril 5 mg daily. Add digoxin 0.125 mg daily and spironolactone 12.5 mg daily.  Will need LHC/RHC to assess coronaries and hemodynamics once she is adequately diuresed. Plan for cath tentatively tomorrow morning.  2. A-Fib RVR: On heparin gtt. Diltiazem stopped this am   due to reduced EF. Carvedilol started, will also add digoxin.  Continue heparin gtt for now, will start apixaban 5 mg bid after catheterization.  Given concern for tachycardia-mediated cardiomyopathy, she will need TEE-guided DCCV after an appropriate interval on apixaban (5+ doses).   3. GERD: GI consulted EGD today 4. NSVT: replace K Magnesium 1.7.  5 Hypokalemia: replace K- add 12.5 mg spironolactone.  6. Snores: Will need assessment for OSA as outpatient.   [name] 09/25/2013 1:15 PM   Length of Stay: 2   Advanced Heart Failure Team Pager [phone] (M-F; 7a - 4p)  Please contact [facility] for night-coverage after hours (4p -7a ) and weekends on [email or web address]    

## 2013-09-26 NOTE — Progress Notes (Signed)
TR band removed, patient's right radial site level 0.  Right radial site began to ooze and a palpable hematoma was present.  Pressure applied and cath lab notified.  Oozing subsided and pressure bandage applied.  VSS & will continue to monitor. Tennille, Mitzi Hansen

## 2013-09-26 NOTE — Interval H&P Note (Signed)
History and Physical Interval Note:  09/26/2013 8:04 AM  Kelli Webster  has presented today for surgery, with the diagnosis of hf  The various methods of treatment have been discussed with the patient and family. After consideration of risks, benefits and other options for treatment, the patient has consented to  Procedure(s): LEFT AND RIGHT HEART CATHETERIZATION WITH CORONARY ANGIOGRAM (N/A) as a surgical intervention .  The patient's history has been reviewed, patient examined, no change in status, stable for surgery.  I have reviewed the patient's chart and labs.  Questions were answered to the patient's satisfaction.     Keiva Dina [employer]

## 2013-09-26 NOTE — Progress Notes (Signed)
Pt refused to watch the cath video. Kelli Webster

## 2013-09-26 NOTE — Progress Notes (Signed)
ANTICOAGULATION CONSULT NOTE Pharmacy Consult for heparin Indication: atrial fibrillation  No Known Allergies  Patient Measurements: Height: 5\' 3"  (160 cm) Weight: 258 lb 11.2 oz (117.346 kg) IBW/kg (Calculated) : 52.4 Heparin Dosing Weight: 100kg  Vital Signs: Temp: 97.9 F (36.6 C) (10/08 1958) Temp src: Oral (10/08 1339) BP: 122/69 mmHg (10/08 1958) Pulse Rate: 83 (10/08 1958)  Labs:  Recent Labs  09/23/13 1510 09/23/13 1600 09/24/13 0150 09/24/13 0530  09/24/13 1106 09/25/13 0430 09/25/13 1617 09/25/13 2340  HGB 13.5  --   --  12.5  --   --  12.3  --   --   HCT 42.6  --   --  39.4  --   --  37.9  --   --   PLT 429*  --   --  378  --   --  366  --   --   APTT  --  28  --   --   --   --   --   --   --   LABPROT  --  13.9  --   --   --   --   --   --   --   INR  --  1.09  --   --   --   --   --   --   --   HEPARINUNFRC  --   --  0.30  --   < >  --  0.26* 0.62 0.96*  CREATININE 0.89  --   --  0.94  --   --  1.00  --   --   TROPONINI  --   --  <0.30 <0.30  --  <0.30  --   --   --   < > = values in this interval not displayed.  Estimated Creatinine Clearance: 68.5 ml/min (by C-G formula based on Cr of 1).  Assessment: 67 year old female with afib for Heparin.  Goal of Therapy:  Heparin level 0.3-0.7 units/ml Monitor platelets by anticoagulation protocol: Yes   Plan:  Decrease heparin 1500 units/hr Follow-up am labs.  Geannie Risen, PharmD, BCPS  09/26/2013, 12:37 AM

## 2013-09-26 NOTE — Progress Notes (Signed)
CARDIAC REHAB PHASE I   PRE:  Rate/Rhythm: 77 Afib  BP:  Supine:   Sitting: 102/64  Standing:    SaO2: 97 RA  MODE:  Ambulation: 360 ft   POST:  Rate/Rhythm: 90  BP:  Supine:   Sitting: 108/45  Standing:    SaO2: 99 RA 1055-1545 Assisted X 1 to ambulate. Gait steady. Pt c/o of being weak with walking. She was able to walk 360 feet. She c/o of feeling weak and ask when she would get her strength back.VS stable Pt to recliner after walk with call light in each. Started CHF education with pt and encouraged her to watch afib and CHF videos on TV.We will follow pt tomorrow to continue education and ambulation.  Melina Copa RN 09/26/2013 3:44 PM

## 2013-09-26 NOTE — Interval H&P Note (Signed)
Cath Lab Visit (complete for each Cath Lab visit)  Clinical Evaluation Leading to the Procedure:   ACS: no  Non-ACS:    Anginal Classification: CCS III  Anti-ischemic medical therapy: Minimal Therapy (1 class of medications)  Non-Invasive Test Results: No non-invasive testing performed  Prior CABG: No previous CABG      History and Physical Interval Note:  09/26/2013 8:05 AM  Kelli Webster  has presented today for surgery, with the diagnosis of hf  The various methods of treatment have been discussed with the patient and family. After consideration of risks, benefits and other options for treatment, the patient has consented to  Procedure(s): LEFT AND RIGHT HEART CATHETERIZATION WITH CORONARY ANGIOGRAM (N/A) as a surgical intervention .  The patient's history has been reviewed, patient examined, no change in status, stable for surgery.  I have reviewed the patient's chart and labs.  Questions were answered to the patient's satisfaction.     Kelli Webster [employer]

## 2013-09-26 NOTE — Progress Notes (Signed)
Called by RN because of cough, pt recently started on ACE. Cough is non-productive and she is afebrile.  Will add Tessalon Perles for cough, d/c ACE for now. MD to see in am and decide on restarting it.

## 2013-09-27 ENCOUNTER — Other Ambulatory Visit (HOSPITAL_COMMUNITY): Payer: Self-pay | Admitting: Anesthesiology

## 2013-09-27 ENCOUNTER — Encounter (HOSPITAL_COMMUNITY): Payer: Self-pay | Admitting: Anesthesiology

## 2013-09-27 LAB — CBC
HCT: 43.1 % (ref 36.0–46.0)
Hemoglobin: 13.8 g/dL (ref 12.0–15.0)
MCH: 27.8 pg (ref 26.0–34.0)
MCHC: 32 g/dL (ref 30.0–36.0)
MCV: 86.7 fL (ref 78.0–100.0)
Platelets: 391 10*3/uL (ref 150–400)
RBC: 4.97 MIL/uL (ref 3.87–5.11)
RDW: 14.8 % (ref 11.5–15.5)
WBC: 6.8 10*3/uL (ref 4.0–10.5)

## 2013-09-27 LAB — POCT I-STAT, CHEM 8
BUN: 14 mg/dL (ref 6–23)
Calcium, Ion: 1.07 mmol/L — ABNORMAL LOW (ref 1.13–1.30)
Chloride: 99 mEq/L (ref 96–112)
Creatinine, Ser: 1.1 mg/dL (ref 0.50–1.10)
Glucose, Bld: 121 mg/dL — ABNORMAL HIGH (ref 70–99)
HCT: 42 % (ref 36.0–46.0)
Hemoglobin: 14.3 g/dL (ref 12.0–15.0)
Potassium: 3.2 mEq/L — ABNORMAL LOW (ref 3.5–5.1)
Sodium: 142 mEq/L (ref 135–145)
TCO2: 27 mmol/L (ref 0–100)

## 2013-09-27 LAB — BASIC METABOLIC PANEL
BUN: 20 mg/dL (ref 6–23)
CO2: 30 mEq/L (ref 19–32)
Calcium: 9 mg/dL (ref 8.4–10.5)
Chloride: 100 mEq/L (ref 96–112)
Creatinine, Ser: 1.1 mg/dL (ref 0.50–1.10)
GFR calc Af Amer: 59 mL/min — ABNORMAL LOW (ref >90)
GFR calc non Af Amer: 51 mL/min — ABNORMAL LOW (ref >90)
Glucose, Bld: 111 mg/dL — ABNORMAL HIGH (ref 70–99)
Potassium: 4.5 mEq/L (ref 3.5–5.1)
Sodium: 139 mEq/L (ref 135–145)

## 2013-09-27 LAB — PROTEIN ELECTROPHORESIS, SERUM
Albumin ELP: 56.6 % (ref 55.8–66.1)
Alpha-1-Globulin: 6.4 % — ABNORMAL HIGH (ref 2.9–4.9)
Alpha-2-Globulin: 13.5 % — ABNORMAL HIGH (ref 7.1–11.8)
Beta 2: 4 % (ref 3.2–6.5)
Beta Globulin: 7.1 % (ref 4.7–7.2)
Gamma Globulin: 12.4 % (ref 11.1–18.8)
M-Spike, %: NOT DETECTED g/dL
Total Protein ELP: 6.3 g/dL (ref 6.0–8.3)

## 2013-09-27 MED ORDER — BENZONATATE 200 MG PO CAPS: 200.0000 mg | ORAL_CAPSULE | Freq: Two times a day (BID) | ORAL | Status: DC

## 2013-09-27 MED ORDER — FAMOTIDINE 20 MG PO TABS: 20.0000 mg | ORAL_TABLET | Freq: Two times a day (BID) | ORAL | Status: DC

## 2013-09-27 MED ORDER — LOSARTAN POTASSIUM 25 MG PO TABS
25.0000 mg | ORAL_TABLET | Freq: Every day | ORAL | Status: DC
  Administered 2013-09-27: 25 mg via ORAL
  Filled 2013-09-27: qty 1

## 2013-09-27 MED ORDER — FUROSEMIDE 40 MG PO TABS: 40.0000 mg | ORAL_TABLET | Freq: Every day | ORAL | Status: DC

## 2013-09-27 MED ORDER — DIGOXIN 125 MCG PO TABS: 0.1250 mg | ORAL_TABLET | Freq: Every day | ORAL | Status: DC

## 2013-09-27 MED ORDER — CARVEDILOL 12.5 MG PO TABS: 12.5000 mg | ORAL_TABLET | Freq: Two times a day (BID) | ORAL | Status: DC

## 2013-09-27 MED ORDER — APIXABAN 5 MG PO TABS: 5.0000 mg | ORAL_TABLET | Freq: Two times a day (BID) | ORAL | Status: DC

## 2013-09-27 MED ORDER — ASPIRIN 81 MG PO TABS: 81.0000 mg | ORAL_TABLET | Freq: Every day | ORAL | Status: DC

## 2013-09-27 MED ORDER — SPIRONOLACTONE 25 MG PO TABS: 25.0000 mg | ORAL_TABLET | Freq: Every day | ORAL | Status: DC

## 2013-09-27 MED ORDER — FUROSEMIDE 40 MG PO TABS
40.0000 mg | ORAL_TABLET | Freq: Every day | ORAL | Status: DC
  Administered 2013-09-27: 40 mg via ORAL
  Filled 2013-09-27: qty 1

## 2013-09-27 MED ORDER — LOSARTAN POTASSIUM 25 MG PO TABS: 25.0000 mg | ORAL_TABLET | Freq: Every day | ORAL | Status: DC

## 2013-09-27 NOTE — Progress Notes (Signed)
Pt stable for discharge, d/c'd via wheelchair to private vehicle via wheelchair

## 2013-09-27 NOTE — Progress Notes (Signed)
[id number] Cardiac Rehab Completed CHF education with pt and husband. They voice understanding. Pt is able to return information about CHF zones, daily weights, fluid restrictions and low sodium diet. She does not like to take medications but voices understanding of why she needs them. Discussed Outpt. CRP with her, she agrees to referral to [place] program. Melina Copa RN

## 2013-09-27 NOTE — Progress Notes (Addendum)
Patient ID: Kelli Webster, female   DOB: 12/04/1946, 66 y.o.   MRN: [id number]     SUBJECTIVE: No dyspnea.  Felt weak when walking but not short of breath.  Remains in atrial fibrillation but HR now controlled.  Cath yesterday with no significant CAD.   . apixaban  5 mg Oral BID  . benzonatate  200 mg Oral BID  . carvedilol  12.5 mg Oral BID WC  . digoxin  0.125 mg Oral Daily  . famotidine  20 mg Oral BID  . furosemide  40 mg Oral Daily  . losartan  25 mg Oral Daily  . potassium chloride  20 mEq Oral Daily  . sodium chloride  3 mL Intravenous Q12H  . sodium chloride  3 mL Intravenous Q12H  . spironolactone  25 mg Oral Daily    Filed Vitals:   09/26/13 1100 09/26/13 1755 09/26/13 2048 09/27/13 0617  BP: 106/70 104/69 120/93 102/53  Pulse: 81 83 96 80  Temp:   98.1 F (36.7 C) 97.7 F (36.5 C)  TempSrc:      Resp:   18 18  Height:      Weight:      SpO2: 97%  96% 96%    Intake/Output Summary (Last 24 hours) at 09/27/13 0730 Last data filed at 09/27/13 0458  Gross per 24 hour  Intake    900 ml  Output    600 ml  Net    300 ml    LABS: Basic Metabolic Panel:  Recent Labs  09/24/13 2025 09/25/13 0430 09/26/13 1055  NA  --  143 143  K  --  3.0* 4.0  CL  --  100 100  CO2  --  32 32  GLUCOSE  --  96 116*  BUN  --  13 16  CREATININE  --  1.00 1.09  CALCIUM  --  8.5 8.7  MG 1.7  --  1.8   Liver Function Tests: No results found for this basename: AST, ALT, ALKPHOS, BILITOT, PROT, ALBUMIN,  in the last 72 hours No results found for this basename: LIPASE, AMYLASE,  in the last 72 hours CBC:  Recent Labs  09/25/13 0430 09/27/13 0615  WBC 7.6 6.8  HGB 12.3 13.8  HCT 37.9 43.1  MCV 84.6 86.7  PLT 366 391   Cardiac Enzymes:  Recent Labs  09/24/13 1106  TROPONINI <0.30   BNP: No components found with this basename: POCBNP,  D-Dimer: No results found for this basename: DDIMER,  in the last 72 hours Hemoglobin A1C: No results found for this basename:  HGBA1C,  in the last 72 hours Fasting Lipid Panel: No results found for this basename: CHOL, HDL, LDLCALC, TRIG, CHOLHDL, LDLDIRECT,  in the last 72 hours Thyroid Function Tests: No results found for this basename: TSH, T4TOTAL, FREET3, T3FREE, THYROIDAB,  in the last 72 hours Anemia Panel: No results found for this basename: VITAMINB12, FOLATE, FERRITIN, TIBC, IRON, RETICCTPCT,  in the last 72 hours  RADIOLOGY: Dg Chest 2 View  09/23/2013   CLINICAL DATA:  Chest pain and shortness of breath  EXAM: CHEST  2 VIEW  COMPARISON:  None  FINDINGS: Cardiac shadow is enlarged. There is prominence of the central pulmonary vascularity identified. No focal infiltrate or sizable effusion is seen. There is fullness surrounding the left pulmonary artery. This is somewhat suspicious for a mass lesion but is not as well visualized on the lateral projection. No acute bony abnormality is noted.  IMPRESSION:   Prominence of the central vascularity.  Prominent soft tissue density adjacent to the left pulmonary artery. CT of the chest is recommended for further evaluation to rule out an underlying mass.   Electronically Signed   By: Mark  Lukens M.D.   On: 09/23/2013 15:50   Ct Chest W Contrast  09/24/2013   CLINICAL DATA:  Prominent left hilar opacity noted on the current chest radiograph. Regional complaint was chest pain and shortness of breath.  EXAM: CT CHEST WITH CONTRAST  TECHNIQUE: Multidetector CT imaging of the chest was performed during intravenous contrast administration.  CONTRAST:  80mL OMNIPAQUE IOHEXOL 300 MG/ML  SOLN  COMPARISON:  Chest radiograph, 09/23/2013.  FINDINGS: There is consolidation in the left upper lobe anteriorly, anterior to the superior left hilum. There is associated mild mediastinal adenopathy. Reference matter was made of a prevascular node measuring 11 mm in short axis and have right subcarinal node measuring 14 mm in short axis. The left upper lobe consolidation accounts for the chest  radiographic opacity.  Elsewhere in the lungs there is a mosaic pattern of attenuation with geographic areas of relative lucency interspersed between areas of subtle increased lung attenuation. This suggests small airways disease.  No pleural effusion is seen.  The heart is mildly enlarged. There are mild to moderate coronary artery calcifications. The right pulmonary artery is dilated to 32 mm in the left dilated to 29 mm. The thoracic aorta is normal in caliber with no dissection.  Both adrenal glands are thickened with the thickened areas being of lower attenuation likely hyperplasia or adenomas. Evidence of a small hiatal hernia. The visualized upper abdomen is otherwise unremarkable.  There are degenerative changes of the visualized spine. No osteoblastic or osteolytic lesions.  IMPRESSION: 1. The opacity noted on the current chest radiograph is due to an area of left upper lobe consolidation, most likely pneumonia. There is associated mild mediastinal adenopathy. 2. In addition, there is also a mosaic pattern of lung attenuation. This is a nonspecific finding. It may reflect small airways disease. It could be from occlusive vascular disease with vascular shunting. The areas of higher attenuation could reflect subtle ground-glass infiltration from infection or inflammation. 3. Cardiomegaly with enlargement of the pulmonary arteries. This raises suspicion for pulmonary hypertension.   Electronically Signed   By: David  Ormond M.D.   On: 09/24/2013 10:17    PHYSICAL EXAM General: NAD Neck: No JVD, no thyromegaly or thyroid nodule.  Lungs: Clear to auscultation bilaterally with normal respiratory effort. CV: Nondisplaced PMI.  Heart regular S1/S2, no S3/S4, no murmur.  No peripheral edema.  No carotid bruit.  Normal pedal pulses.  Abdomen: Soft, nontender, no hepatosplenomegaly, no distention.  Neurologic: Alert and oriented x 3.  Psych: Normal affect. Extremities: No clubbing or cyanosis.  Right radial  2+ pulse.   TELEMETRY: Reviewed telemetry pt in atrial fibrillation, HR 60s  ASSESSMENT AND PLAN: 66 yo admitted with atrial fibrillation/RVR and acute systolic CHF.  1. Atrial fibrillation: Rate is now controlled but still in atrial fibrillation.  She was started on Eliquis after LHC yesterday.  She is on Coreg for rate control.  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  She will need DCCV to NSR.  She needs 5 doses of Eliquis prior so earliest would be Monday.  She feels much better this morning so will likely let her go home and followup early next week for TEE-guided DCCV.  Would try cardioverting her initially without an antiarrhythmic.  If she recurs, would need to consider   amiodarone vs dofetilide.  Will also need to consider atrial fibrillation ablation, especially if EF improves when she is in NSR.  2. Acute systolic CHF: EF 20-25%.  No CAD on cardiac cath.  Now appears euvolemic (I/Os poorly recorded but weight is down).  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  Lisinopril was stopped yesterday due to cough apparently.  - Change Lasix to po.  - Add losartan 25 mg daily - Continue Coreg, digoxin, spironolactone at current doses.  - Echo 2-3 months after DCCV.  3. Abnormal CXR: Patient has a LUL consolidation on CT, no clinical PNA.  Will need followup CXR as outpatient to make sure this clears.  4. EGD negative during this admission.  5. Disposition: Home today.  Will arrange for TEE-guided DCCV early next week.  Needs to go home on Eliquis 5 mg bid, Lasix 40 mg daily, losartan 25 mg daily, Coreg 12.5 mg bid, spironolactone 25 mg daily, digoxin 0.125 mg daily.   Kelli Webster 09/27/2013 7:41 AM   

## 2013-09-27 NOTE — Discharge Instructions (Addendum)
Atrial Fibrillation Atrial fibrillation is an abnormal heartbeat (rhythm). It can cause your heart rate to be faster or slower than normal, and can cause clots of blood to form in your heart. These clots can cause other health problems. Atrial fibrillation may be caused by a heart attack, lung problem, or certain medicine. Sometimes the cause of atrial fibrillation is not found. HOME CARE  Take blood thinning medicine (anticoagulants) as told by your doctor. Your doctor will need to draw your blood to check lab values if you take blood thinners.  If you had a cardioversion, limit your activity as told by your doctor.  Learn how to check your heartbeat (pulse) for an abnormal or irregular beat. Your doctor can show you how.  Ask your doctor if it is okay to exercise.  Only take medicine as told by your doctor. GET HELP RIGHT AWAY IF:   You have trouble breathing or feel dizzy.  You have puffy (swollen) feet or ankles.  You have blood in your pee (urine) or poop (bowel movement).  You feel your heart "skipping" beats.  You feel your heart "racing" or beating fast.  You have weakness in your arms or legs.  You have trouble talking, seeing, or thinking.  You have chest pain or pain in your arm or jaw. MAKE SURE YOU:   Understand these instructions.  Will watch your condition.  Will get help right away if you are not doing well or get worse. Document Released: 09/13/2008 Document Revised: 02/27/2012 Document Reviewed: 03/25/2010 [facility] Patient Information 2014 [place], [place].  Heart Failure Heart failure means your heart has trouble pumping blood. This makes it hard for your body to work well. Heart failure is a long-term (chronic) condition. You must take good care of yourself and follow your doctor's treatment plan. HOME CARE  Take your heart medicine as told by your doctor.  Do not stop taking medicine unless your doctor tells you to.  Do not skip any dose of  medicine.  Refill your medicines before they run out.  Only take other medicines after your doctor approves.  Stay active and follow your activity program as told by your doctor.  Rest for 1 hour before and after meals.  Eat heart healthy foods. This includes fresh or frozen fruits and vegetables, fish, lean meats, fat-free or low-fat dairy foods, whole grains, and high-fiber foods.  Do not eat more than 1500 milligrams of salt each day or as told by your doctor.  Cook in a healthy way. Roast, grill, broil, bake, poach, steam, or stir-fry foods.  Limit fluids as told by your doctor.  Weigh yourself every morning. Do this after you pee (urinate) and before you eat breakfast. Write down your weight to give to your doctor.  Take your blood pressure and write it down if your doctor tell you to.  Ask your doctor how to check your pulse. Check your pulse as told.  Lose weight if you are overweight. Maintain a healthy weight.  Stop smoking or chewing tobacco. Do not use gum or patches that help you quit without your doctor's approval.  Schedule and go to doctor visits as told.  Nonpregnant women should have no more than 1 drink a day. Men should have no more than 2 drinks a day. Talk to your doctor about drinking alcohol.  Stop drug use.  Stay current with shots (immunizations).  Manage your health conditions as told by your doctor.  Learn to manage your stress.  Rest  when you are tired.  If it is really hot outside:  Avoid intense activities.  Use air conditioning or fans, or get in a cooler place.  Avoid caffeine and alcohol.  Wear loose-fitting, lightweight, and light-colored clothing.  If it is really cold outside:  Avoid intense activities.  Layer your clothing.  Wear mittens or gloves, a hat, and a scarf when going outside.  Avoid alcohol.  Learn about heart failure and get support as needed.  Get help to maintain or improve your quality of life and your  ability to care for yourself as needed. GET HELP IF:   You gain 3 lb/1.4 kg or more in 1 day or 5 lb/2.3 kg in a week.  You are more short of breath than usual.  You cannot do your normal activities.  You tire easily.  You cough more than normal, especially with activity.  You have any or more puffiness (swelling) in areas such as your hands, feet, ankles, or belly (abdomen).  You cannot sleep because it is hard to breathe.  You cough up thick spit (mucus) that is bloody.  You feel like your heart is beating fast (palpitations).  You get dizzy or lightheaded when you stand up. GET HELP RIGHT AWAY IF:   You have trouble breathing.  There is a change in mental status, such as becoming less alert or not being able to focus.  You have chest pain or discomfort.  You faint.  You were outside in hot weather and show signs of your body overheating (heat exhaustion):  Heavy sweating.  Muscle cramps.  Weakness.  Dizziness.  Headaches.  Fainting.  You were outside in cold weather and show signs of low body temperature (hypothermia):  Clumsiness.  Confusion.  Sleepiness.  Shivering. MAKE SURE YOU:   Understand these instructions.  Will watch your condition.  Will get help right away if you are not doing well or get worse. Document Released: 09/13/2008 Document Revised: 11/21/2012 Document Reviewed: 07/05/2012 [facility] Patient Information 2014 [place], [place].  Information on my medicine - ELIQUIS (apixaban)  This medication education was reviewed with me or my healthcare representative as part of my discharge preparation.  The pharmacist that spoke with me during my hospital stay was:  Marcelino Scot, [facility]  Why was Eliquis prescribed for you? Eliquis was prescribed for you to reduce the risk of forming blood clots that can cause a stroke if you have a medical condition called atrial fibrillation (a type of irregular heartbeat) OR to reduce the risk of a  blood clots forming after orthopedic surgery.  What do You need to know about Eliquis ? Take your Eliquis TWICE DAILY - one tablet in the morning and one tablet in the evening with or without food.  It would be best to take the doses about the same time each day.  If you have difficulty swallowing the tablet whole please discuss with your pharmacist how to take the medication safely.  Take Eliquis exactly as prescribed by your doctor and DO NOT stop taking Eliquis without talking to the doctor who prescribed the medication.  Stopping may increase your risk of developing a new clot or stroke.  Refill your prescription before you run out.  After discharge, you should have regular check-up appointments with your healthcare provider that is prescribing your Eliquis.  In the future your dose may need to be changed if your kidney function or weight changes by a significant amount or as you get older.  What do you do if you miss a dose? If you miss a dose, take it as soon as you remember on the same day and resume taking twice daily.  Do not take more than one dose of ELIQUIS at the same time.  Important Safety Information A possible side effect of Eliquis is bleeding. You should call your healthcare provider right away if you experience any of the following:   Bleeding from an injury or your nose that does not stop.   Unusual colored urine (red or dark brown) or unusual colored stools (red or black).   Unusual bruising for unknown reasons.   A serious fall or if you hit your head (even if there is no bleeding).  Some medicines may interact with Eliquis and might increase your risk of bleeding or clotting while on Eliquis. To help avoid this, consult your healthcare provider or pharmacist prior to using any new prescription or non-prescription medications, including herbals, vitamins, non-steroidal anti-inflammatory drugs (NSAIDs) and supplements.  This website has more information on  Eliquis (apixaban): [email or web address].com.cy.

## 2013-09-30 ENCOUNTER — Telehealth: Payer: Self-pay | Admitting: Cardiovascular Disease

## 2013-09-30 LAB — UIFE/LIGHT CHAINS/TP QN, 24-HR UR
Albumin, U: DETECTED
Alpha 1, Urine: NOT DETECTED
Alpha 2, Urine: NOT DETECTED
Beta, Urine: NOT DETECTED
Free Kappa Lt Chains,Ur: 0.2 mg/dL (ref 0.14–2.42)
Free Kappa/Lambda Ratio: 6.67 ratio (ref 2.04–10.37)
Free Lambda Lt Chains,Ur: 0.03 mg/dL (ref 0.02–0.67)
Gamma Globulin, Urine: NOT DETECTED
Total Protein, Urine: 0.7 mg/dL

## 2013-09-30 NOTE — Telephone Encounter (Signed)
Spoke with patient who needs instructions for TEE with Cardioversion scheduled for tomorrow 10/14 with Dr. Shirlee Latch.  I spoke with Dr. Shirlee Latch regarding patient's medications and he advised patient take all medications tomorrow except Digoxin.  I advised patient to be NPO after midnight tonight except for all am meds with a sip of water except Digoxin or Lanoxin.  Patient verbalized understanding and states she is writing this information down.  I advised patient of the location and time of the procedure and advised her regarding wearing comfortable clothing, no jewelry, bringing insurance card and list of medications and to have a driver.  Patient verbalized understanding of all instructions and plans to arrive at [facility] Admitting at 0900 tomorrow 10/14.

## 2013-09-30 NOTE — Telephone Encounter (Signed)
New problem     Patient has a question about meds. she can and can not take before her Cardioversion 10/14 patient does not have amy information about procedure.  Please give her a call back.  Thanks!

## 2013-10-01 ENCOUNTER — Ambulatory Visit (HOSPITAL_COMMUNITY)
Admission: RE | Admit: 2013-10-01 | Discharge: 2013-10-01 | Disposition: A | Discharge status: Home / Self Care | Payer: Medicare Other | Source: Ambulatory Visit | Attending: Cardiology | Admitting: Cardiology

## 2013-10-01 ENCOUNTER — Ambulatory Visit (HOSPITAL_COMMUNITY): Payer: Medicare Other | Admitting: Certified Registered"

## 2013-10-01 ENCOUNTER — Encounter (HOSPITAL_COMMUNITY): Payer: Self-pay | Admitting: *Deleted

## 2013-10-01 ENCOUNTER — Encounter (HOSPITAL_COMMUNITY): Payer: Medicare Other | Admitting: Certified Registered"

## 2013-10-01 ENCOUNTER — Encounter (HOSPITAL_COMMUNITY)
Admission: RE | Disposition: A | Discharge status: Home / Self Care | Payer: Self-pay | Source: Ambulatory Visit | Attending: Cardiology

## 2013-10-01 DIAGNOSIS — I4891 Unspecified atrial fibrillation: Secondary | ICD-10-CM

## 2013-10-01 DIAGNOSIS — K219 Gastro-esophageal reflux disease without esophagitis: Secondary | ICD-10-CM | POA: Insufficient documentation

## 2013-10-01 HISTORY — PX: CARDIOVERSION: SHX1299

## 2013-10-01 HISTORY — PX: TEE WITHOUT CARDIOVERSION: SHX5443

## 2013-10-01 SURGERY — ECHOCARDIOGRAM, TRANSESOPHAGEAL
Anesthesia: General

## 2013-10-01 MED ORDER — FENTANYL CITRATE 0.05 MG/ML IJ SOLN
INTRAMUSCULAR | Status: AC
  Filled 2013-10-01: qty 2

## 2013-10-01 MED ORDER — PROPOFOL 10 MG/ML IV BOLUS
INTRAVENOUS | Status: DC | PRN
  Administered 2013-10-01: 50 mg via INTRAVENOUS

## 2013-10-01 MED ORDER — LIDOCAINE HCL (CARDIAC) 20 MG/ML IV SOLN
INTRAVENOUS | Status: DC | PRN
  Administered 2013-10-01: 20 mg via INTRAVENOUS

## 2013-10-01 MED ORDER — FENTANYL CITRATE 0.05 MG/ML IJ SOLN
INTRAMUSCULAR | Status: DC | PRN
  Administered 2013-10-01: 25 ug via INTRAVENOUS

## 2013-10-01 MED ORDER — SODIUM CHLORIDE 0.9 % IV SOLN
INTRAVENOUS | Status: DC
  Administered 2013-10-01: 500 mL via INTRAVENOUS

## 2013-10-01 MED ORDER — CARVEDILOL 12.5 MG PO TABS: 6.2500 mg | ORAL_TABLET | Freq: Two times a day (BID) | ORAL | Status: DC

## 2013-10-01 MED ORDER — DIPHENHYDRAMINE HCL 50 MG/ML IJ SOLN
INTRAMUSCULAR | Status: AC
  Filled 2013-10-01: qty 1

## 2013-10-01 MED ORDER — MIDAZOLAM HCL 10 MG/2ML IJ SOLN
INTRAMUSCULAR | Status: DC | PRN
  Administered 2013-10-01 (×2): 2 mg via INTRAVENOUS

## 2013-10-01 MED ORDER — BUTAMBEN-TETRACAINE-BENZOCAINE 2-2-14 % EX AERO
INHALATION_SPRAY | CUTANEOUS | Status: DC | PRN
  Administered 2013-10-01: 2 via TOPICAL

## 2013-10-01 MED ORDER — MIDAZOLAM HCL 5 MG/ML IJ SOLN
INTRAMUSCULAR | Status: AC
  Filled 2013-10-01: qty 2

## 2013-10-01 NOTE — Transfer of Care (Signed)
Immediate Anesthesia Transfer of Care Note  Patient: Kelli Webster  Procedure(s) Performed: Procedure(s): TRANSESOPHAGEAL ECHOCARDIOGRAM (TEE) (N/A) CARDIOVERSION (N/A)  Patient Location: Endoscopy Unit  Anesthesia Type:General  Level of Consciousness: awake, alert , oriented and patient cooperative  Airway & Oxygen Therapy: Patient Spontanous Breathing and Patient connected to nasal cannula oxygen  Post-op Assessment: Report given to PACU RN, Post -op Vital signs reviewed and stable and Patient moving all extremities  Post vital signs: Reviewed and stable  Complications: No apparent anesthesia complications

## 2013-10-01 NOTE — Anesthesia Postprocedure Evaluation (Signed)
  Anesthesia Post-op Note  Patient: Kelli Webster  Procedure(s) Performed: Procedure(s): TRANSESOPHAGEAL ECHOCARDIOGRAM (TEE) (N/A) CARDIOVERSION (N/A)  Patient Location: Endoscopy Unit  Anesthesia Type:General  Level of Consciousness: awake, alert , oriented and patient cooperative  Airway and Oxygen Therapy: Patient Spontanous Breathing  Post-op Pain: none  Post-op Assessment: Post-op Vital signs reviewed, Patient's Cardiovascular Status Stable, Respiratory Function Stable, Patent Airway, No signs of Nausea or vomiting and Pain level controlled  Post-op Vital Signs: Reviewed and stable  Complications: No apparent anesthesia complications

## 2013-10-01 NOTE — Anesthesia Postprocedure Evaluation (Signed)
  Anesthesia Post-op Note  Patient: Kelli Webster  Procedure(s) Performed: Procedure(s): TRANSESOPHAGEAL ECHOCARDIOGRAM (TEE) (N/A) CARDIOVERSION (N/A)  Patient Location: Endoscopy Unit  Anesthesia Type:General  Level of Consciousness: awake, alert  and oriented  Airway and Oxygen Therapy: Patient Spontanous Breathing  Post-op Pain: none  Post-op Assessment: Post-op Vital signs reviewed, Patient's Cardiovascular Status Stable, Respiratory Function Stable and Patent Airway  Post-op Vital Signs: stable  Complications: No apparent anesthesia complications

## 2013-10-01 NOTE — Anesthesia Preprocedure Evaluation (Addendum)
Anesthesia Evaluation  Patient identified by MRN, date of birth, ID band Patient awake    Reviewed: Allergy & Precautions, H&P , NPO status , Patient's Chart, lab work & pertinent test results, reviewed documented beta blocker date and time   Airway Mallampati: II TM Distance: >3 FB     Dental   Pulmonary  breath sounds clear to auscultation        Cardiovascular + dysrhythmias Atrial Fibrillation Rhythm:Irregular     Neuro/Psych    GI/Hepatic GERD-  ,  Endo/Other    Renal/GU      Musculoskeletal   Abdominal   Peds  Hematology   Anesthesia Other Findings   Reproductive/Obstetrics                          Anesthesia Physical Anesthesia Plan  ASA: III  Anesthesia Plan: General   Post-op Pain Management:    Induction: Intravenous  Airway Management Planned: Mask  Additional Equipment:   Intra-op Plan:   Post-operative Plan:   Informed Consent: I have reviewed the patients History and Physical, chart, labs and discussed the procedure including the risks, benefits and alternatives for the proposed anesthesia with the patient or authorized representative who has indicated his/her understanding and acceptance.     Plan Discussed with: CRNA, Anesthesiologist and Surgeon  Anesthesia Plan Comments:        Anesthesia Quick Evaluation

## 2013-10-01 NOTE — Progress Notes (Signed)
Echocardiogram Echocardiogram Transesophageal has been performed.  Kelli Webster 10/01/2013, 11:29 AM

## 2013-10-01 NOTE — Procedures (Addendum)
Electrical Cardioversion Procedure Note Kelli Webster [id number] Feb 03, 1946  Procedure: Electrical Cardioversion Indications:  Atrial Fibrillation.  She has been on apixaban for about 5 days and TEE showed no LA thrombus today.   Procedure Details Consent: Risks of procedure as well as the alternatives and risks of each were explained to the (patient/caregiver).  Consent for procedure obtained. Time Out: Verified patient identification, verified procedure, site/side was marked, verified correct patient position, special equipment/implants available, medications/allergies/relevent history reviewed, required imaging and test results available.  Performed  Patient placed on cardiac monitor, pulse oximetry, supplemental oxygen as necessary.  Sedation given: Propofol per anesthesiology. Pacer pads placed anterior and posterior chest.  Cardioverted 1 time(s).  Cardioverted at 200J.  Evaluation Findings: Post procedure EKG shows: NSR, bradycardic.  Complications: None Patient did tolerate procedure well.  HR high 40s-50s, cut Coreg in half to 6.25 mg bid.    Kelli Webster 10/01/2013, 11:27 AM

## 2013-10-01 NOTE — Preoperative (Signed)
Beta Blockers   Reason not to administer Beta Blockers:Not Applicable 

## 2013-10-01 NOTE — CV Procedure (Addendum)
Procedure: TEE  Indication: Atrial fibrillation, pre-DCCV.  Sedation: Versed 4 mg, 25 mcg IV  Indication: Pre-cardioversion  Findings: Please see echo section of chart for full report.  Mildly dilated LV with global hypokinesis and EF 25%. Normal RV size and systolic function.  Trivial MR. Negative bubble study. No LA appendage thrombus.    No complications.   May proceed to DCCV.  [name] 10/01/2013 11:16 AM

## 2013-10-01 NOTE — H&P (View-Only) (Signed)
Patient ID: Kelli Webster, female   DOB: Feb 21, 1946, 67 y.o.   MRN: [id number]     SUBJECTIVE: No dyspnea.  Felt weak when walking but not short of breath.  Remains in atrial fibrillation but HR now controlled.  Cath yesterday with no significant CAD.   Marland Kitchen apixaban  5 mg Oral BID  . benzonatate  200 mg Oral BID  . carvedilol  12.5 mg Oral BID WC  . digoxin  0.125 mg Oral Daily  . famotidine  20 mg Oral BID  . furosemide  40 mg Oral Daily  . losartan  25 mg Oral Daily  . potassium chloride  20 mEq Oral Daily  . sodium chloride  3 mL Intravenous Q12H  . sodium chloride  3 mL Intravenous Q12H  . spironolactone  25 mg Oral Daily    Filed Vitals:   09/26/13 1100 09/26/13 1755 09/26/13 2048 09/27/13 0617  BP: 106/70 104/69 120/93 102/53  Pulse: 81 83 96 80  Temp:   98.1 F (36.7 C) 97.7 F (36.5 C)  TempSrc:      Resp:   18 18  Height:      Weight:      SpO2: 97%  96% 96%    Intake/Output Summary (Last 24 hours) at 09/27/13 0730 Last data filed at 09/27/13 0458  Gross per 24 hour  Intake    900 ml  Output    600 ml  Net    300 ml    LABS: Basic Metabolic Panel:  Recent Labs  78/29/56 2025 09/25/13 0430 09/26/13 1055  NA  --  143 143  K  --  3.0* 4.0  CL  --  100 100  CO2  --  32 32  GLUCOSE  --  96 116*  BUN  --  13 16  CREATININE  --  1.00 1.09  CALCIUM  --  8.5 8.7  MG 1.7  --  1.8   Liver Function Tests: No results found for this basename: AST, ALT, ALKPHOS, BILITOT, PROT, ALBUMIN,  in the last 72 hours No results found for this basename: LIPASE, AMYLASE,  in the last 72 hours CBC:  Recent Labs  09/25/13 0430 09/27/13 0615  WBC 7.6 6.8  HGB 12.3 13.8  HCT 37.9 43.1  MCV 84.6 86.7  PLT 366 391   Cardiac Enzymes:  Recent Labs  09/24/13 1106  TROPONINI <0.30   BNP: No components found with this basename: POCBNP,  D-Dimer: No results found for this basename: DDIMER,  in the last 72 hours Hemoglobin A1C: No results found for this basename:  HGBA1C,  in the last 72 hours Fasting Lipid Panel: No results found for this basename: CHOL, HDL, LDLCALC, TRIG, CHOLHDL, LDLDIRECT,  in the last 72 hours Thyroid Function Tests: No results found for this basename: TSH, T4TOTAL, FREET3, T3FREE, THYROIDAB,  in the last 72 hours Anemia Panel: No results found for this basename: VITAMINB12, FOLATE, FERRITIN, TIBC, IRON, RETICCTPCT,  in the last 72 hours  RADIOLOGY: Dg Chest 2 View  09/23/2013   CLINICAL DATA:  Chest pain and shortness of breath  EXAM: CHEST  2 VIEW  COMPARISON:  None  FINDINGS: Cardiac shadow is enlarged. There is prominence of the central pulmonary vascularity identified. No focal infiltrate or sizable effusion is seen. There is fullness surrounding the left pulmonary artery. This is somewhat suspicious for a mass lesion but is not as well visualized on the lateral projection. No acute bony abnormality is noted.  IMPRESSION:  Prominence of the central vascularity.  Prominent soft tissue density adjacent to the left pulmonary artery. CT of the chest is recommended for further evaluation to rule out an underlying mass.   Electronically Signed   By: Alcide Clever M.D.   On: 09/23/2013 15:50   Ct Chest W Contrast  09/24/2013   CLINICAL DATA:  Prominent left hilar opacity noted on the current chest radiograph. Regional complaint was chest pain and shortness of breath.  EXAM: CT CHEST WITH CONTRAST  TECHNIQUE: Multidetector CT imaging of the chest was performed during intravenous contrast administration.  CONTRAST:  80mL OMNIPAQUE IOHEXOL 300 MG/ML  SOLN  COMPARISON:  Chest radiograph, 09/23/2013.  FINDINGS: There is consolidation in the left upper lobe anteriorly, anterior to the superior left hilum. There is associated mild mediastinal adenopathy. Reference matter was made of a prevascular node measuring 11 mm in short axis and have right subcarinal node measuring 14 mm in short axis. The left upper lobe consolidation accounts for the chest  radiographic opacity.  Elsewhere in the lungs there is a mosaic pattern of attenuation with geographic areas of relative lucency interspersed between areas of subtle increased lung attenuation. This suggests small airways disease.  No pleural effusion is seen.  The heart is mildly enlarged. There are mild to moderate coronary artery calcifications. The right pulmonary artery is dilated to 32 mm in the left dilated to 29 mm. The thoracic aorta is normal in caliber with no dissection.  Both adrenal glands are thickened with the thickened areas being of lower attenuation likely hyperplasia or adenomas. Evidence of a small hiatal hernia. The visualized upper abdomen is otherwise unremarkable.  There are degenerative changes of the visualized spine. No osteoblastic or osteolytic lesions.  IMPRESSION: 1. The opacity noted on the current chest radiograph is due to an area of left upper lobe consolidation, most likely pneumonia. There is associated mild mediastinal adenopathy. 2. In addition, there is also a mosaic pattern of lung attenuation. This is a nonspecific finding. It may reflect small airways disease. It could be from occlusive vascular disease with vascular shunting. The areas of higher attenuation could reflect subtle ground-glass infiltration from infection or inflammation. 3. Cardiomegaly with enlargement of the pulmonary arteries. This raises suspicion for pulmonary hypertension.   Electronically Signed   By: Amie Portland M.D.   On: 09/24/2013 10:17    PHYSICAL EXAM General: NAD Neck: No JVD, no thyromegaly or thyroid nodule.  Lungs: Clear to auscultation bilaterally with normal respiratory effort. CV: Nondisplaced PMI.  Heart regular S1/S2, no S3/S4, no murmur.  No peripheral edema.  No carotid bruit.  Normal pedal pulses.  Abdomen: Soft, nontender, no hepatosplenomegaly, no distention.  Neurologic: Alert and oriented x 3.  Psych: Normal affect. Extremities: No clubbing or cyanosis.  Right radial  2+ pulse.   TELEMETRY: Reviewed telemetry pt in atrial fibrillation, HR 60s  ASSESSMENT AND PLAN: 67 yo admitted with atrial fibrillation/RVR and acute systolic CHF.  1. Atrial fibrillation: Rate is now controlled but still in atrial fibrillation.  She was started on Eliquis after LHC yesterday.  She is on Coreg for rate control.  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  She will need DCCV to NSR.  She needs 5 doses of Eliquis prior so earliest would be Monday.  She feels much better this morning so will likely let her go home and followup early next week for TEE-guided DCCV.  Would try cardioverting her initially without an antiarrhythmic.  If she recurs, would need to consider  amiodarone vs dofetilide.  Will also need to consider atrial fibrillation ablation, especially if EF improves when she is in NSR.  2. Acute systolic CHF: EF 16-10%.  No CAD on cardiac cath.  Now appears euvolemic (I/Os poorly recorded but weight is down).  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  Lisinopril was stopped yesterday due to cough apparently.  - Change Lasix to po.  - Add losartan 25 mg daily - Continue Coreg, digoxin, spironolactone at current doses.  - Echo 2-3 months after DCCV.  3. Abnormal CXR: Patient has a LUL consolidation on CT, no clinical PNA.  Will need followup CXR as outpatient to make sure this clears.  4. EGD negative during this admission.  5. Disposition: Home today.  Will arrange for TEE-guided DCCV early next week.  Needs to go home on Eliquis 5 mg bid, Lasix 40 mg daily, losartan 25 mg daily, Coreg 12.5 mg bid, spironolactone 25 mg daily, digoxin 0.125 mg daily.   [name] 09/27/2013 7:41 AM

## 2013-10-01 NOTE — Interval H&P Note (Signed)
History and Physical Interval Note:  10/01/2013 11:00 AM  Kelli Webster  has presented today for surgery, with the diagnosis of AFIB  The various methods of treatment have been discussed with the patient and family. After consideration of risks, benefits and other options for treatment, the patient has consented to  Procedure(s): TRANSESOPHAGEAL ECHOCARDIOGRAM (TEE) (N/A) CARDIOVERSION (N/A) as a surgical intervention .  The patient's history has been reviewed, patient examined, no change in status, stable for surgery.  I have reviewed the patient's chart and labs.  Questions were answered to the patient's satisfaction.     Stephen Turnbaugh [employer]

## 2013-10-02 ENCOUNTER — Encounter (HOSPITAL_COMMUNITY): Payer: Self-pay | Admitting: Cardiology

## 2013-10-10 ENCOUNTER — Telehealth (HOSPITAL_COMMUNITY): Payer: Self-pay | Admitting: *Deleted

## 2013-10-10 ENCOUNTER — Encounter (HOSPITAL_COMMUNITY): Payer: Self-pay | Admitting: *Deleted

## 2013-10-10 ENCOUNTER — Encounter (HOSPITAL_COMMUNITY): Payer: Self-pay

## 2013-10-10 ENCOUNTER — Ambulatory Visit (HOSPITAL_COMMUNITY)
Admit: 2013-10-10 | Discharge: 2013-10-10 | Disposition: A | Discharge status: Home / Self Care | Payer: Medicare Other | Source: Ambulatory Visit | Attending: Internal Medicine | Admitting: Internal Medicine

## 2013-10-10 VITALS — BP 132/84 | HR 91 | Wt 247.0 lb

## 2013-10-10 DIAGNOSIS — I4891 Unspecified atrial fibrillation: Secondary | ICD-10-CM | POA: Insufficient documentation

## 2013-10-10 DIAGNOSIS — I509 Heart failure, unspecified: Secondary | ICD-10-CM | POA: Insufficient documentation

## 2013-10-10 DIAGNOSIS — I5042 Chronic combined systolic (congestive) and diastolic (congestive) heart failure: Secondary | ICD-10-CM | POA: Insufficient documentation

## 2013-10-10 DIAGNOSIS — I5022 Chronic systolic (congestive) heart failure: Secondary | ICD-10-CM | POA: Insufficient documentation

## 2013-10-10 LAB — BASIC METABOLIC PANEL
BUN: 44 mg/dL — ABNORMAL HIGH (ref 6–23)
CO2: 25 mEq/L (ref 19–32)
Calcium: 10 mg/dL (ref 8.4–10.5)
Chloride: 98 mEq/L (ref 96–112)
Creatinine, Ser: 1.64 mg/dL — ABNORMAL HIGH (ref 0.50–1.10)
GFR calc Af Amer: 37 mL/min — ABNORMAL LOW (ref >90)
GFR calc non Af Amer: 32 mL/min — ABNORMAL LOW (ref >90)
Glucose, Bld: 184 mg/dL — ABNORMAL HIGH (ref 70–99)
Potassium: 4.6 mEq/L (ref 3.5–5.1)
Sodium: 137 mEq/L (ref 135–145)

## 2013-10-10 LAB — DIGOXIN LEVEL: Digoxin Level: 1.4 ng/mL (ref 0.8–2.0)

## 2013-10-10 MED ORDER — BENZONATATE 100 MG PO CAPS: 100.0000 mg | ORAL_CAPSULE | Freq: Three times a day (TID) | ORAL | Status: DC | PRN

## 2013-10-10 MED ORDER — LOSARTAN POTASSIUM 50 MG PO TABS: 50.0000 mg | ORAL_TABLET | Freq: Every day | ORAL | Status: DC

## 2013-10-10 MED ORDER — DIGOXIN 125 MCG PO TABS: 0.1250 mg | ORAL_TABLET | ORAL | Status: DC

## 2013-10-10 MED ORDER — FUROSEMIDE 40 MG PO TABS: 20.0000 mg | ORAL_TABLET | ORAL | Status: DC

## 2013-10-10 MED ORDER — LOSARTAN POTASSIUM 25 MG PO TABS: 25.0000 mg | ORAL_TABLET | Freq: Every day | ORAL | Status: DC

## 2013-10-10 MED ORDER — AMIODARONE HCL 200 MG PO TABS: ORAL_TABLET | ORAL | Status: DC

## 2013-10-10 NOTE — Telephone Encounter (Signed)
Message copied by Noralee Space on Thu Oct 10, 2013  3:12 PM ------      Message from: Laurey Morale      Created: Thu Oct 10, 2013  1:48 PM       Please call patient.  Do NOT increase losartan to 50 mg daily, leave at 25 mg daily.  Stop Lasix x 2 days, then resume Lasix at 20 mg every other day.  Decrease digoxin to every other day dosing.  Repeat BMET and digoxin level at cardioversion next week. ------

## 2013-10-10 NOTE — Progress Notes (Signed)
Patient ID: Kelli Webster, female   DOB: 03/07/1946, 66 y.o.   MRN: [id number] PCP: Dr. Spear  66 yo with presents for followup after recent hospitalization with atrial fibrillation/RVR and acute systolic CHF.  She was admitted in 10/14 with severe dyspnea for several days and was found to have atrial fibrillation with RVR and volume overload.  EF 20-25% on echo, diffuse hypokinesis.  She was diuresed and eventually underwent TEE-guided DCCV to NSR.  She was bradycardic after cardioversion so Coreg was decreased.  Unfortunately, she is back in atrial fibrillation with controlled rate today. She actually feels much better.  No exertional dyspnea.  She is able to walk around in stores and do her usual activities with no problems.  No orthopnea or PND.  No lightheadedness/syncope.  She does not feel palpitations.   ECG: atrial fibrillation at 94, anterolateral TWIs  Labs (10/14): K 4.5, creatinine 1.1, HIV negative, SPEP/UPEP negative, LDL 62, HDL 45  PMH:  1. Obesity 2. GERD 3. OA 4. EGD negative in 10/14 5. ACEI cough 6. Atrial fibrillation: First diagnosed in 10/14.  DCCV to NSR in 10/14 but atrial fibrillation recurred.  7. Cardiomyopathy: Echo (10/14) with EF 20-25%, diffuse HK.  LHC (10/14) showed no CAD.  TSH normal, HIV negative, SPEP/UPEP negative.  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  8. Sinus bradycardia  SH: Married, lives in [place], nonsmoker.   FH: No history of cardiomyopathy or premature CAD.   ROS: All systems reviewed and negative except as per HPI.   Current Outpatient Prescriptions  Medication Sig Dispense Refill  . apixaban (ELIQUIS) 5 MG TABS tablet Take 1 tablet (5 mg total) by mouth 2 (two) times daily.  180 tablet  6  . carvedilol (COREG) 12.5 MG tablet Take 0.5 tablets (6.25 mg total) by mouth 2 (two) times daily with a meal.  180 tablet  6  . digoxin (LANOXIN) 0.125 MG tablet Take 1 tablet (0.125 mg total) by mouth daily.  90 tablet  6  . famotidine (PEPCID) 20  MG tablet Take 1 tablet (20 mg total) by mouth 2 (two) times daily.  180 tablet  6  . furosemide (LASIX) 40 MG tablet Take 1 tablet (40 mg total) by mouth daily.  90 tablet  6  . losartan (COZAAR) 50 MG tablet Take 1 tablet (50 mg total) by mouth daily.  30 tablet  6  . spironolactone (ALDACTONE) 25 MG tablet Take 1 tablet (25 mg total) by mouth daily.  90 tablet  6  . amiodarone (PACERONE) 200 MG tablet Take 400 mg (2 tabs) Twice daily for 4 days then decrease to 400 mg (2 tabs) daily  80 tablet  3  . benzonatate (TESSALON PERLES) 100 MG capsule Take 1 capsule (100 mg total) by mouth 3 (three) times daily as needed for cough.  20 capsule  0   No current facility-administered medications for this encounter.    BP 132/84  Pulse 91  Wt 247 lb (112.038 kg)  BMI 43.76 kg/m2  SpO2 98% General: NAD, obese Neck: No JVD, no thyromegaly or thyroid nodule.  Lungs: Clear to auscultation bilaterally with normal respiratory effort. CV: Nondisplaced PMI.  Heart irregular S1/S2, no S3/S4, no murmur.  No peripheral edema.  No carotid bruit.  Normal pedal pulses.  Abdomen: Soft, nontender, no hepatosplenomegaly, no distention.  Skin: Intact without lesions or rashes.  Neurologic: Alert and oriented x 3.  Psych: Normal affect. Extremities: No clubbing or cyanosis.   Assessment/Plan: 1. Atrial fibrillation: Patient is   back in atrial fibrillation today with controlled rate.  She was cardioverted to NSR about a week ago.  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  Therefore, I would like her out of atrial fibrillation.   - I will start her on amiodarone 400 mg bid x 4 days then 400 mg daily after that and arrange for DCCV next Thursday.  She has been on Eliquis without missing a dose since her TEE.   - I am going to refer her to Dr. Allred for consideration of atrial fibrillation ablation given her presumed tachy-mediated CMP.  If she has the ablation, I would then get her off  amiodarone.  - Continue Eliquis.  Can stop ASA.  2. Chronic systolic CHF: Nonischemic CMP, possibly tachycardia-mediated.   She is back in atrial fibrillation.  Will try to get her out of atrial fibrillation as above.   - Continue Coreg at current dose (cut back because she was bradycardic when she went into NSR with DCCV).   - Will increase losartan to 50 mg daily with BMET in a week.  - Continue same spironolactone.  - Echo after she has been out of atrial fibrillation for 3 months (if EF still low, would need to consider ICD).   Kelli Webster 10/10/2013 12:22 PM    

## 2013-10-10 NOTE — Telephone Encounter (Signed)
Pt aware and verbalizes understanding  

## 2013-10-10 NOTE — Patient Instructions (Signed)
Start Amiodarone 400 mg (2 tabs) Twice daily for 4 days then decrease to 400 mg (2 tabs) daily  Increase Losartan to 50 mg daily  Stop Aspirin  Labs today  You have been referred to Dr Johney Frame  Your physician has recommended that you have a Cardioversion (DCCV). Electrical Cardioversion uses a jolt of electricity to your heart either through paddles or wired patches attached to your chest. This is a controlled, usually prescheduled, procedure. Defibrillation is done under light anesthesia in the hospital, and you usually go home the day of the procedure. This is done to get your heart back into a normal rhythm. You are not awake for the procedure. Please see the instruction sheet given to you today.  Your physician recommends that you schedule a follow-up appointment in: 2 weeks

## 2013-10-11 ENCOUNTER — Other Ambulatory Visit (HOSPITAL_COMMUNITY): Payer: Self-pay | Admitting: Anesthesiology

## 2013-10-12 NOTE — Addendum Note (Signed)
Encounter addended by: Deitra Mayo, CCT on: 10/12/2013  9:41 AM<BR>     Documentation filed: Charges VN

## 2013-10-16 ENCOUNTER — Telehealth: Payer: Self-pay | Admitting: Cardiology

## 2013-10-16 ENCOUNTER — Encounter (HOSPITAL_COMMUNITY): Payer: Self-pay | Admitting: Pharmacist

## 2013-10-16 NOTE — Telephone Encounter (Signed)
New message    Having cardioversion in am---questions about medications

## 2013-10-16 NOTE — Discharge Summary (Signed)
Advanced Heart Failure Team  Discharge Summary   Patient ID: Kelli Webster MRN: [id number], DOB/AGE: [birth year] 67 y.o. Admit date: 10/01/2013 D/C date:     10/16/2013   Primary Discharge Diagnoses:  1) Acute systolic heart failure, EF 20-25% (09/24/2013)         -- felt to be related to afib (tachy mediated CMP) 2) Atrial Fibrillation        -- on Eliquis 5 mg BID, scheduled for TEE-DCCV 10/01/13  Secondary Discharge Diagnoses:  1) Abnormal CXR         -- CT 09/24/13 - LUL consolidation, no clinical PNA. Will need F/U as outpt 2) GERD        -- EGD 09/25/13- nl  Hospital Course:  Kelli Webster is a 67 yo female with no prior cardiac with a month plus history of GERD like symptoms, increased DOE, and recent lower extremity edema. She saw her PCP on the day of admission for the increased indigestion and was noted to be tachycardic and ECG showed Afib with RVR and she was sent to the ED. Upon arrival to the ED she was placed on IV diltiazem well as IV heparin and her rate came down from the 130s to low 100s. She did appear to be volume overloaded so IV lasix was started and she was admitted for further work up. Pertinent labs on admission were K+ 4.3, Cr 0.89, Hgb 13.5, negative troponin and abnl cxr showing soft tissue density adjacent to the L PA. CT was ordered for abnl CXR and showed LUL consolidation, no clinical PNA.  GI was consulted due to her persistent GERD symptoms and she ended up having an endoscopy which was normal 09/25/13. She was continued on pepcid BID. An ECHO was performed which showed an EF of 20-25% which was new to the patient. It was thought that it could have been a tachycardia-mediated cardiomyopathy (atrial fibrillation with RVR at presentation, not sure how long she had been in this) or that she could have developed atrial fibrillation in the setting of a cardiomyopathy from another etiology and she was scheduled for a LHC/RHC. TSH, SPEP/UPEP, and HIV antibody were all negative.  She was started on coreg, spiro, lisinopril, digoxin and eliquis. She developed a cough on lisinopril and it was changed to losartan. She ended up transitioning to PO lasix and diuresed a total of 2 liters and her discharge weight was 255 lbs.  Coreg helped with her rate control,however she remained in afib throughout the hospital stay. She is scheduled for a TEE-DCCV next Tuesday Oct 14 after at least 5 doses of Eliquis. If she is unable to convert to NSR without an antiarrhythmic would need to consider amiodarone vs dofetilide. Will also need to consider atrial fibrillation ablation, especially if EF improves when she is in NSR.  [facility] 09/26/13 AO 133/88  LV 137/17 Left mainstem: No significant disease.  Left anterior descending (LAD): 30% proximal LAD stenosis.  Left circumflex (LCx): Mild luminal irregularities.  Right coronary artery (RCA): Mild luminal irregularities.   Discharge Weight Range: 253-255 lbs.  Discharge Vitals: Blood pressure 86/42, pulse 52, temperature 97.4 F (36.3 C), temperature source Oral, resp. rate 14, weight 241 lb (109.317 kg), SpO2 100.00%.  Labs: Lab Results  Component Value Date   WBC 6.8 09/27/2013   HGB 13.8 09/27/2013   HCT 43.1 09/27/2013   MCV 86.7 09/27/2013   PLT 391 09/27/2013     Recent Labs Lab 10/10/13 1038  NA 137  K  4.6  CL 98  CO2 25  BUN 44*  CREATININE 1.64*  CALCIUM 10.0  GLUCOSE 184*   Lab Results  Component Value Date   CHOL 121 09/24/2013   HDL 45 09/24/2013   LDLCALC 62 09/24/2013   TRIG 72 09/24/2013   BNP (last 3 results)  Recent Labs  09/24/13 0150  PROBNP 4997.0*    Diagnostic Studies/Procedures   No results found.  Discharge Medications     Medication List         apixaban 5 MG Tabs tablet  Commonly known as:  ELIQUIS  Take 1 tablet (5 mg total) by mouth 2 (two) times daily.     carvedilol 12.5 MG tablet  Commonly known as:  COREG  Take 0.5 tablets (6.25 mg total) by mouth 2 (two) times daily  with a meal.     famotidine 20 MG tablet  Commonly known as:  PEPCID  Take 1 tablet (20 mg total) by mouth 2 (two) times daily.     spironolactone 25 MG tablet  Commonly known as:  ALDACTONE  Take 1 tablet (25 mg total) by mouth daily.        Disposition   The patient will be discharged in stable condition to home.  Future Appointments Provider Department Dept Phone   10/23/2013 3:20 PM [facility] [phone]   11/15/2013 10:45 AM Hillis Range, MD [facility] Office [phone]         Duration of Discharge Encounter: Greater than 35 minutes   Signed, [name]  10/16/2013, 5:18 PM

## 2013-10-16 NOTE — Telephone Encounter (Signed)
PT called to make sure she is to take the new medication Amiodarone 200 mg. Pt was made aware that on the instruction letter states that pt is to start taking her new medication. Pt verbalized understanding.

## 2013-10-17 ENCOUNTER — Encounter (HOSPITAL_COMMUNITY): Payer: Medicare Other | Admitting: Anesthesiology

## 2013-10-17 ENCOUNTER — Ambulatory Visit (HOSPITAL_COMMUNITY)
Admission: RE | Admit: 2013-10-17 | Discharge: 2013-10-17 | Disposition: A | Discharge status: Home / Self Care | Payer: Medicare Other | Source: Ambulatory Visit | Attending: Cardiology | Admitting: Cardiology

## 2013-10-17 ENCOUNTER — Encounter (HOSPITAL_COMMUNITY): Payer: Self-pay

## 2013-10-17 ENCOUNTER — Encounter (HOSPITAL_COMMUNITY)
Admission: RE | Disposition: A | Discharge status: Home / Self Care | Payer: Self-pay | Source: Ambulatory Visit | Attending: Cardiology

## 2013-10-17 ENCOUNTER — Encounter (HOSPITAL_COMMUNITY): Payer: Medicare Other

## 2013-10-17 ENCOUNTER — Ambulatory Visit (HOSPITAL_COMMUNITY): Payer: Medicare Other | Admitting: Anesthesiology

## 2013-10-17 DIAGNOSIS — I4891 Unspecified atrial fibrillation: Secondary | ICD-10-CM | POA: Insufficient documentation

## 2013-10-17 DIAGNOSIS — Z7901 Long term (current) use of anticoagulants: Secondary | ICD-10-CM | POA: Insufficient documentation

## 2013-10-17 DIAGNOSIS — I10 Essential (primary) hypertension: Secondary | ICD-10-CM | POA: Insufficient documentation

## 2013-10-17 DIAGNOSIS — I509 Heart failure, unspecified: Secondary | ICD-10-CM | POA: Insufficient documentation

## 2013-10-17 HISTORY — PX: CARDIOVERSION: SHX1299

## 2013-10-17 LAB — CBC WITH DIFFERENTIAL/PLATELET
Basophils Absolute: 0.1 10*3/uL (ref 0.0–0.1)
Basophils Relative: 1 % (ref 0–1)
Eosinophils Absolute: 0.1 10*3/uL (ref 0.0–0.7)
Eosinophils Relative: 2 % (ref 0–5)
HCT: 42.8 % (ref 36.0–46.0)
Hemoglobin: 14.5 g/dL (ref 12.0–15.0)
Lymphocytes Relative: 25 % (ref 12–46)
Lymphs Abs: 1.6 10*3/uL (ref 0.7–4.0)
MCH: 28 pg (ref 26.0–34.0)
MCHC: 33.9 g/dL (ref 30.0–36.0)
MCV: 82.6 fL (ref 78.0–100.0)
Monocytes Absolute: 0.5 10*3/uL (ref 0.1–1.0)
Monocytes Relative: 7 % (ref 3–12)
Neutro Abs: 4.1 10*3/uL (ref 1.7–7.7)
Neutrophils Relative %: 65 % (ref 43–77)
Platelets: 281 10*3/uL (ref 150–400)
RBC: 5.18 MIL/uL — ABNORMAL HIGH (ref 3.87–5.11)
RDW: 14.6 % (ref 11.5–15.5)
WBC: 6.3 10*3/uL (ref 4.0–10.5)

## 2013-10-17 LAB — BASIC METABOLIC PANEL
BUN: 35 mg/dL — ABNORMAL HIGH (ref 6–23)
CO2: 25 mEq/L (ref 19–32)
Calcium: 9.5 mg/dL (ref 8.4–10.5)
Chloride: 100 mEq/L (ref 96–112)
Creatinine, Ser: 1.51 mg/dL — ABNORMAL HIGH (ref 0.50–1.10)
GFR calc Af Amer: 40 mL/min — ABNORMAL LOW (ref >90)
GFR calc non Af Amer: 35 mL/min — ABNORMAL LOW (ref >90)
Glucose, Bld: 152 mg/dL — ABNORMAL HIGH (ref 70–99)
Potassium: 4.2 mEq/L (ref 3.5–5.1)
Sodium: 137 mEq/L (ref 135–145)

## 2013-10-17 LAB — DIGOXIN LEVEL: Digoxin Level: 1 ng/mL (ref 0.8–2.0)

## 2013-10-17 SURGERY — CARDIOVERSION
Anesthesia: General

## 2013-10-17 MED ORDER — ETOMIDATE 2 MG/ML IV SOLN
INTRAVENOUS | Status: DC | PRN
  Administered 2013-10-17: 12 mg via INTRAVENOUS

## 2013-10-17 MED ORDER — SODIUM CHLORIDE 0.9 % IV SOLN
INTRAVENOUS | Status: DC
  Administered 2013-10-17: 09:00:00 via INTRAVENOUS

## 2013-10-17 MED ORDER — AMIODARONE HCL 200 MG PO TABS: 200.0000 mg | ORAL_TABLET | Freq: Every day | ORAL | Status: DC

## 2013-10-17 MED ORDER — LIDOCAINE HCL (CARDIAC) 20 MG/ML IV SOLN
INTRAVENOUS | Status: DC | PRN
  Administered 2013-10-17: 30 mg via INTRAVENOUS

## 2013-10-17 NOTE — Interval H&P Note (Signed)
History and Physical Interval Note:  10/17/2013 10:28 AM  Kelli Webster  has presented today for surgery, with the diagnosis of AFIB  The various methods of treatment have been discussed with the patient and family. After consideration of risks, benefits and other options for treatment, the patient has consented to  Procedure(s): CARDIOVERSION (N/A) as a surgical intervention .  The patient's history has been reviewed, patient examined, no change in status, stable for surgery.  I have reviewed the patient's chart and labs.  Questions were answered to the patient's satisfaction.     Lailie Smead [employer]

## 2013-10-17 NOTE — Procedures (Signed)
Electrical Cardioversion Procedure Note Kelli Webster [id number] October 02, 1946  Procedure: Electrical Cardioversion Indications:  Atrial Fibrillation.  Patient has been on Eliquis without missing a dose.   Procedure Details Consent: Risks of procedure as well as the alternatives and risks of each were explained to the (patient/caregiver).  Consent for procedure obtained. Time Out: Verified patient identification, verified procedure, site/side was marked, verified correct patient position, special equipment/implants available, medications/allergies/relevent history reviewed, required imaging and test results available.  Performed  Patient placed on cardiac monitor, pulse oximetry, supplemental oxygen as necessary.  Sedation given: Propofol per anesthesiology.  Pacer pads placed anterior and posterior chest.  Cardioverted 1 time(s).  Cardioverted at 200J.  Evaluation Findings: Post procedure EKG shows: NSR Complications: None Patient did tolerate procedure well.  She will decrease amiodarone to 200 mg daily.   She will need a sleep study.    [name] 10/17/2013, 10:53 AM

## 2013-10-17 NOTE — Anesthesia Postprocedure Evaluation (Signed)
  Anesthesia Post-op Note  Patient: Kelli Webster  Procedure(s) Performed: Procedure(s): CARDIOVERSION (N/A)  Patient Location: Short Stay  Anesthesia Type:General  Level of Consciousness: awake, alert  and oriented  Airway and Oxygen Therapy: Patient Spontanous Breathing and Patient connected to nasal cannula oxygen  Post-op Pain: none  Post-op Assessment: Post-op Vital signs reviewed, Patient's Cardiovascular Status Stable, Respiratory Function Stable, Patent Airway and No signs of Nausea or vomiting  Post-op Vital Signs: Reviewed and stable  Complications: No apparent anesthesia complications

## 2013-10-17 NOTE — Anesthesia Preprocedure Evaluation (Addendum)
Anesthesia Evaluation  Patient identified by MRN, date of birth, ID band Patient awake    Reviewed: Allergy & Precautions, H&P , NPO status , Patient's Chart, lab work & pertinent test results, reviewed documented beta blocker date and time   History of Anesthesia Complications Negative for: history of anesthetic complications  Airway Mallampati: I TM Distance: >3 FB Neck ROM: Full    Dental  (+) Teeth Intact and Dental Advisory Given   Pulmonary neg pulmonary ROS,  breath sounds clear to auscultation  Pulmonary exam normal       Cardiovascular hypertension, Pt. on medications and Pt. on home beta blockers +CHF + dysrhythmias Atrial Fibrillation Rhythm:Irregular Rate:Normal  Echo 10/01/13 ef 25%   Neuro/Psych    GI/Hepatic Neg liver ROS, GERD-  Medicated and Controlled,  Endo/Other  Morbid obesity  Renal/GU negative Renal ROS     Musculoskeletal   Abdominal (+) + obese,   Peds  Hematology   Anesthesia Other Findings   Reproductive/Obstetrics                         Anesthesia Physical Anesthesia Plan  ASA: III  Anesthesia Plan: General   Post-op Pain Management:    Induction: Intravenous  Airway Management Planned: Mask and Natural Airway  Additional Equipment:   Intra-op Plan:   Post-operative Plan:   Informed Consent: I have reviewed the patients History and Physical, chart, labs and discussed the procedure including the risks, benefits and alternatives for the proposed anesthesia with the patient or authorized representative who has indicated his/her understanding and acceptance.   Dental advisory given  Plan Discussed with: Anesthesiologist, Surgeon and CRNA  Anesthesia Plan Comments: (Plan routine monitors, GA for cardioversion)       Anesthesia Quick Evaluation

## 2013-10-17 NOTE — Transfer of Care (Signed)
Immediate Anesthesia Transfer of Care Note  Patient: Kelli Webster  Procedure(s) Performed: Procedure(s): CARDIOVERSION (N/A)  Patient Location: Short Stay  Anesthesia Type:General  Level of Consciousness: awake, alert  and oriented  Airway & Oxygen Therapy: Patient Spontanous Breathing and Patient connected to nasal cannula oxygen  Post-op Assessment: Report given to PACU RN and Post -op Vital signs reviewed and stable  Post vital signs: Reviewed and stable  Complications: No apparent anesthesia complications

## 2013-10-17 NOTE — H&P (View-Only) (Signed)
Patient ID: Kelli Webster, female   DOB: [birth year], 67 y.o.   MRN: [id number] PCP: Dr. Collins Scotland  67 yo with presents for followup after recent hospitalization with atrial fibrillation/RVR and acute systolic CHF.  She was admitted in 10/14 with severe dyspnea for several days and was found to have atrial fibrillation with RVR and volume overload.  EF 20-25% on echo, diffuse hypokinesis.  She was diuresed and eventually underwent TEE-guided DCCV to NSR.  She was bradycardic after cardioversion so Coreg was decreased.  Unfortunately, she is back in atrial fibrillation with controlled rate today. She actually feels much better.  No exertional dyspnea.  She is able to walk around in stores and do her usual activities with no problems.  No orthopnea or PND.  No lightheadedness/syncope.  She does not feel palpitations.   ECG: atrial fibrillation at 94, anterolateral TWIs  Labs (10/14): K 4.5, creatinine 1.1, HIV negative, SPEP/UPEP negative, LDL 62, HDL 45  PMH:  1. Obesity 2. GERD 3. OA 4. EGD negative in 10/14 5. ACEI cough 6. Atrial fibrillation: First diagnosed in 10/14.  DCCV to NSR in 10/14 but atrial fibrillation recurred.  7. Cardiomyopathy: Echo (10/14) with EF 20-25%, diffuse HK.  LHC (10/14) showed no CAD.  TSH normal, HIV negative, SPEP/UPEP negative.  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  8. Sinus bradycardia  SH: Married, lives in [place], nonsmoker.   FH: No history of cardiomyopathy or premature CAD.   ROS: All systems reviewed and negative except as per HPI.   Current Outpatient Prescriptions  Medication Sig Dispense Refill  . apixaban (ELIQUIS) 5 MG TABS tablet Take 1 tablet (5 mg total) by mouth 2 (two) times daily.  180 tablet  6  . carvedilol (COREG) 12.5 MG tablet Take 0.5 tablets (6.25 mg total) by mouth 2 (two) times daily with a meal.  180 tablet  6  . digoxin (LANOXIN) 0.125 MG tablet Take 1 tablet (0.125 mg total) by mouth daily.  90 tablet  6  . famotidine (PEPCID) 20  MG tablet Take 1 tablet (20 mg total) by mouth 2 (two) times daily.  180 tablet  6  . furosemide (LASIX) 40 MG tablet Take 1 tablet (40 mg total) by mouth daily.  90 tablet  6  . losartan (COZAAR) 50 MG tablet Take 1 tablet (50 mg total) by mouth daily.  30 tablet  6  . spironolactone (ALDACTONE) 25 MG tablet Take 1 tablet (25 mg total) by mouth daily.  90 tablet  6  . amiodarone (PACERONE) 200 MG tablet Take 400 mg (2 tabs) Twice daily for 4 days then decrease to 400 mg (2 tabs) daily  80 tablet  3  . benzonatate (TESSALON PERLES) 100 MG capsule Take 1 capsule (100 mg total) by mouth 3 (three) times daily as needed for cough.  20 capsule  0   No current facility-administered medications for this encounter.    BP 132/84  Pulse 91  Wt 247 lb (112.038 kg)  BMI 43.76 kg/m2  SpO2 98% General: NAD, obese Neck: No JVD, no thyromegaly or thyroid nodule.  Lungs: Clear to auscultation bilaterally with normal respiratory effort. CV: Nondisplaced PMI.  Heart irregular S1/S2, no S3/S4, no murmur.  No peripheral edema.  No carotid bruit.  Normal pedal pulses.  Abdomen: Soft, nontender, no hepatosplenomegaly, no distention.  Skin: Intact without lesions or rashes.  Neurologic: Alert and oriented x 3.  Psych: Normal affect. Extremities: No clubbing or cyanosis.   Assessment/Plan: 1. Atrial fibrillation: Patient is  back in atrial fibrillation today with controlled rate.  She was cardioverted to NSR about a week ago.  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  Therefore, I would like her out of atrial fibrillation.   - I will start her on amiodarone 400 mg bid x 4 days then 400 mg daily after that and arrange for DCCV next Thursday.  She has been on Eliquis without missing a dose since her TEE.   - I am going to refer her to Dr. 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  If she has the ablation, I would then get her off  amiodarone.  - Continue Eliquis.  Can stop ASA.  2. Chronic systolic CHF: Nonischemic CMP, possibly tachycardia-mediated.   She is back in atrial fibrillation.  Will try to get her out of atrial fibrillation as above.   - Continue Coreg at current dose (cut back because she was bradycardic when she went into NSR with DCCV).   - Will increase losartan to 50 mg daily with BMET in a week.  - Continue same spironolactone.  - Echo after she has been out of atrial fibrillation for 3 months (if EF still low, would need to consider ICD).   [name] 10/10/2013 12:22 PM

## 2013-10-18 ENCOUNTER — Encounter (HOSPITAL_COMMUNITY): Payer: Self-pay | Admitting: Cardiology

## 2013-10-23 ENCOUNTER — Ambulatory Visit (HOSPITAL_COMMUNITY)
Admission: RE | Admit: 2013-10-23 | Discharge: 2013-10-23 | Disposition: A | Discharge status: Home / Self Care | Payer: Medicare Other | Source: Ambulatory Visit | Attending: Internal Medicine | Admitting: Internal Medicine

## 2013-10-23 VITALS — BP 138/82 | HR 100 | Wt 245.8 lb

## 2013-10-23 DIAGNOSIS — I5022 Chronic systolic (congestive) heart failure: Secondary | ICD-10-CM

## 2013-10-23 DIAGNOSIS — R0683 Snoring: Secondary | ICD-10-CM

## 2013-10-23 DIAGNOSIS — R0989 Other specified symptoms and signs involving the circulatory and respiratory systems: Secondary | ICD-10-CM | POA: Insufficient documentation

## 2013-10-23 DIAGNOSIS — I509 Heart failure, unspecified: Secondary | ICD-10-CM | POA: Insufficient documentation

## 2013-10-23 DIAGNOSIS — I4891 Unspecified atrial fibrillation: Secondary | ICD-10-CM | POA: Insufficient documentation

## 2013-10-23 DIAGNOSIS — R0609 Other forms of dyspnea: Secondary | ICD-10-CM | POA: Insufficient documentation

## 2013-10-23 LAB — COMPREHENSIVE METABOLIC PANEL
ALT: 24 U/L (ref 0–35)
AST: 32 U/L (ref 0–37)
Albumin: 4.3 g/dL (ref 3.5–5.2)
Alkaline Phosphatase: 62 U/L (ref 39–117)
BUN: 36 mg/dL — ABNORMAL HIGH (ref 6–23)
CO2: 24 mEq/L (ref 19–32)
Calcium: 9.6 mg/dL (ref 8.4–10.5)
Chloride: 96 mEq/L (ref 96–112)
Creatinine, Ser: 1.74 mg/dL — ABNORMAL HIGH (ref 0.50–1.10)
GFR calc Af Amer: 34 mL/min — ABNORMAL LOW (ref >90)
GFR calc non Af Amer: 29 mL/min — ABNORMAL LOW (ref >90)
Glucose, Bld: 125 mg/dL — ABNORMAL HIGH (ref 70–99)
Potassium: 5 mEq/L (ref 3.5–5.1)
Sodium: 135 mEq/L (ref 135–145)
Total Bilirubin: 0.7 mg/dL (ref 0.3–1.2)
Total Protein: 7.9 g/dL (ref 6.0–8.3)

## 2013-10-23 LAB — TSH: TSH: 2.555 u[IU]/mL (ref 0.350–4.500)

## 2013-10-23 MED ORDER — CARVEDILOL 12.5 MG PO TABS: 12.5000 mg | ORAL_TABLET | Freq: Two times a day (BID) | ORAL | Status: DC

## 2013-10-23 NOTE — Patient Instructions (Signed)
Increase coreg back to 12.5 mg twice a day.  Will call with lab results.  Will set up for sleep study.  Follow up 6 weeks.  Call any issues  Do the following things EVERYDAY: 1) Weigh yourself in the morning before breakfast. Write it down and keep it in a log. 2) Take your medicines as prescribed 3) Eat low salt foods-Limit salt (sodium) to 2000 mg per day.  4) Stay as active as you can everyday 5) Limit all fluids for the day to less than 2 liters

## 2013-10-23 NOTE — Progress Notes (Signed)
Patient ID: Kelli Webster, female   DOB: 07/14/1946, 67 y.o.   MRN: [id number] PCP: Dr. Collins Scotland  67 yo with presents for follow up. Recent hospitalization with atrial fibrillation/RVR and acute systolic CHF.  She was admitted in 09/2013 with severe dyspnea for several days and was found to have atrial fibrillation with RVR and volume overload.  EF 20-25% on echo, diffuse hypokinesis.  She was diuresed and eventually underwent TEE-guided DCCV to NSR.  She was bradycardic after cardioversion so Coreg was decreased.    Follow up: Since last visit had another cardioversion for afib and she converted back to NSR. Feels pretty good. Thinks that she maybe going in and out of a-fib and noticed that she was irregular Monday. She reports she does not feel as good since Monday. Denies SOB, orthopnea, or CP. No exertional dyspnea.  She is able to walk around in stores and do her usual activities with no problems. No lightheadedness/syncope.  ECG today confirms that she is back in atrial fibrillation.   ECG: atrial fibrillation with rate 89, poor anterior R wave progression.   Labs (10/14): K 4.5, creatinine 1.1, HIV negative, SPEP/UPEP negative, LDL 62, HDL 45 Labs (10/18/13): K 4.2, creatinine 1.5, digoxin 1.0   PMH:  1. Obesity 2. GERD 3. OA 4. EGD negative in 10/14 5. ACEI cough 6. Atrial fibrillation: First diagnosed in 10/14.  DCCV to NSR in 09/2013 but atrial fibrillation recurred. Cardioversion 10/18/2013 successful on amiodarone but back in atrial fibrillation in 11/14.  7. Cardiomyopathy: Echo (10/14) with EF 20-25%, diffuse HK.  LHC (10/14) showed no CAD.  TSH normal, HIV negative, SPEP/UPEP negative.  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  8. Sinus bradycardia  SH: Married, lives in [place], nonsmoker.   FH: No history of cardiomyopathy or premature CAD.   ROS: All systems reviewed and negative except as per HPI.   Current Outpatient Prescriptions  Medication Sig Dispense Refill  . amiodarone  (PACERONE) 200 MG tablet Take 1 tablet (200 mg total) by mouth daily.  30 tablet  6  . apixaban (ELIQUIS) 5 MG TABS tablet Take 1 tablet (5 mg total) by mouth 2 (two) times daily.  180 tablet  6  . carvedilol (COREG) 12.5 MG tablet Take 0.5 tablets (6.25 mg total) by mouth 2 (two) times daily with a meal.  180 tablet  6  . digoxin (LANOXIN) 0.125 MG tablet Take 1 tablet (0.125 mg total) by mouth every other day.  90 tablet  6  . famotidine (PEPCID) 20 MG tablet Take 1 tablet (20 mg total) by mouth 2 (two) times daily.  180 tablet  6  . furosemide (LASIX) 40 MG tablet Take 0.5 tablets (20 mg total) by mouth every other day.  90 tablet  6  . losartan (COZAAR) 25 MG tablet Take 1 tablet (25 mg total) by mouth daily.  30 tablet  6  . spironolactone (ALDACTONE) 25 MG tablet Take 1 tablet (25 mg total) by mouth daily.  90 tablet  6   No current facility-administered medications for this encounter.    Filed Vitals:   10/23/13 1538  BP: 138/82  Pulse: 100  Weight: 245 lb 12 oz (111.471 kg)  SpO2: 99%   General: NAD, obese Neck: No JVD, no thyromegaly or thyroid nodule.  Lungs: Clear to auscultation bilaterally with normal respiratory effort. CV: Nondisplaced PMI.  Heart irregular S1/S2, no S3/S4, no murmur.  No peripheral edema.  No carotid bruit.  Normal pedal pulses.  Abdomen: Soft, nontender, no  hepatosplenomegaly, no distention.  Skin: Intact without lesions or rashes.  Neurologic: Alert and oriented x 3.  Psych: Normal affect. Extremities: No clubbing or cyanosis.   Assessment/Plan: 1. Atrial fibrillation: Patient is back in atrial fibrillation today again after second cardioversion, this time on amiodarone. Rate is controlled. She held NSR for about 1 week. She thinks she maybe going in and out of atrial fibrillation at home. 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  Therefore, would like her out of atrial fibrillation.  - She has had Amiodarone load before last  cardioversion. Will continue Amiodarone 200 mg daily as the atrial fibrillation may be paroxysmal. Will get TSH and LFTs today.  - Going Dr. 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  Attempting cardioversion on dofetilide would be an option but she has been on amiodarone so would have to wait until amiodarone level is low which could be several months.   - Continue Eliquis 5 mg BID. 2. Chronic systolic CHF: Nonischemic CMP, possibly tachycardia-mediated. She is back in atrial fibrillation. As above will see Dr. Johney Frame for consideration of ablation.  - Increase Coreg to 12.5 mg BID. Continue same spironolactone and losartan. - Reinforced the need and importance of daily weights, a low sodium diet, and fluid restriction (less than 2 L a day). Instructed to call the HF clinic if weight increases more than 3 lbs overnight or 5 lbs in a week.  - Echo after she has been out of atrial fibrillation for 3 months (if EF still low, would need to consider ICD). - Continue Lasix every other day.  Will get BMET today.  3. ?OSA - Will set up for sleep study preferably before Dr. Jenel Lucks appt.   Ulla Potash B NP-C 10/23/2013 3:41 PM  Patient seen with NP, agree with the above note.  Unfortunately, she is back in atrial fibrillation today after cardioversion last week.  I suspect she has a tachy-mediated CMP so would like to try to keep her in NSR.  Today, will increase Coreg.  Will continue amiodarone for now.  She will see Dr. Johney Frame for consideration of atrial fibrillation ablation.  Dofetilide is a consideration but would have to be off amiodarone likely several months to allow level to fall.  Will need sleep study.   [name] 10/24/2013

## 2013-10-25 ENCOUNTER — Other Ambulatory Visit: Payer: Self-pay | Admitting: *Deleted

## 2013-10-25 ENCOUNTER — Telehealth: Payer: Self-pay | Admitting: Cardiology

## 2013-10-25 NOTE — Telephone Encounter (Signed)
New Problem  Ms. Trish from [facility] as a Chart analyst states that this patients ED Notes are incorrect. She notes that the admit date is supposed to be OCt 6th and the discharge is supposed to be Oct 10// however since it was signed the PA cannot do anything with it// Please call back to discuss and Resign

## 2013-10-27 NOTE — Discharge Summary (Signed)
Advanced Heart Failure Team  Discharge Summary   Patient ID: Kelli Webster MRN: [id number], DOB/AGE: 67/18/1947 67 y.o. Admit date: 09/23/2013 D/C date:     09/27/2013   Primary Discharge Diagnoses:  1) Acute systolic heart failure, EF 20-25% (09/24/2013)         -- felt to be related to afib (tachy mediated CMP) 2) Atrial Fibrillation        -- on Eliquis 5 mg BID, scheduled for TEE-DCCV 10/01/13  Secondary Discharge Diagnoses:  1) Abnormal CXR         -- CT 09/24/13 - LUL consolidation, no clinical PNA. Will need F/U as outpt 2) GERD        -- EGD 09/25/13- nl  Hospital Course:  Mrs. Petitti is a 67 yo female with no prior cardiac with a month plus history of GERD like symptoms, increased DOE, and recent lower extremity edema. She saw her PCP on the day of admission for the increased indigestion and was noted to be tachycardic and ECG showed Afib with RVR and she was sent to the ED. Upon arrival to the ED she was placed on IV diltiazem well as IV heparin and her rate came down from the 130s to low 100s. She did appear to be volume overloaded so IV lasix was started and she was admitted for further work up. Pertinent labs on admission were K+ 4.3, Cr 0.89, Hgb 13.5, negative troponin and abnl cxr showing soft tissue density adjacent to the L PA. CT was ordered for abnl CXR and showed LUL consolidation, no clinical PNA.  GI was consulted due to her persistent GERD symptoms and she ended up having an endoscopy which was normal 09/25/13. She was continued on pepcid BID. An ECHO was performed which showed an EF of 20-25% which was new to the patient. It was thought that it could have been a tachycardia-mediated cardiomyopathy (atrial fibrillation with RVR at presentation, not sure how long she had been in this) or that she could have developed atrial fibrillation in the setting of a cardiomyopathy from another etiology and she was scheduled for a LHC/RHC. TSH, SPEP/UPEP, and HIV antibody were all negative.  She was started on coreg, spiro, lisinopril, digoxin and eliquis. She developed a cough on lisinopril and it was changed to losartan. She ended up transitioning to PO lasix and diuresed a total of 2 liters and her discharge weight was 255 lbs.  Coreg helped with her rate control,however she remained in afib throughout the hospital stay. She is scheduled for a TEE-DCCV next Tuesday Oct 14 after at least 5 doses of Eliquis. If she is unable to convert to NSR without an antiarrhythmic would need to consider amiodarone vs dofetilide. Will also need to consider atrial fibrillation ablation, especially if EF improves when she is in NSR.  [facility] 09/26/13 AO 133/88  LV 137/17 Left mainstem: No significant disease.  Left anterior descending (LAD): 30% proximal LAD stenosis.  Left circumflex (LCx): Mild luminal irregularities.  Right coronary artery (RCA): Mild luminal irregularities.   Discharge Weight Range: 253-255 lbs.  Discharge Vitals: Blood pressure 102/53, pulse 80, temperature 97.7 F (36.5 C), temperature source Oral, resp. rate 18, height 5\' 3"  (1.6 m), weight 115.758 kg (255 lb 3.2 oz), SpO2 96.00%.  Labs: Lab Results  Component Value Date   WBC 6.3 10/17/2013   HGB 14.5 10/17/2013   HCT 42.8 10/17/2013   MCV 82.6 10/17/2013   PLT 281 10/17/2013     Recent Labs Lab  10/23/13 1601  NA 135  K 5.0  CL 96  CO2 24  BUN 36*  CREATININE 1.74*  CALCIUM 9.6  PROT 7.9  BILITOT 0.7  ALKPHOS 62  ALT 24  AST 32  GLUCOSE 125*   Lab Results  Component Value Date   CHOL 121 09/24/2013   HDL 45 09/24/2013   LDLCALC 62 09/24/2013   TRIG 72 09/24/2013   BNP (last 3 results)  Recent Labs  09/24/13 0150  PROBNP 4997.0*    Diagnostic Studies/Procedures   No results found.  Discharge Medications     Medication List    STOP taking these medications       aspirin 325 MG tablet     esomeprazole 40 MG capsule  Commonly known as:  NEXIUM     omeprazole 20 MG capsule   Commonly known as:  PRILOSEC     ZANTAC PO      TAKE these medications       apixaban 5 MG Tabs tablet  Commonly known as:  ELIQUIS  Take 1 tablet (5 mg total) by mouth 2 (two) times daily.     famotidine 20 MG tablet  Commonly known as:  PEPCID  Take 1 tablet (20 mg total) by mouth 2 (two) times daily.     spironolactone 25 MG tablet  Commonly known as:  ALDACTONE  Take 1 tablet (25 mg total) by mouth daily.        Disposition   The patient will be discharged in stable condition to home. Discharge Orders   Future Appointments Provider Department Dept Phone   11/15/2013 10:45 AM Hillis Range, MD [facility] [place] Office [phone]   11/27/2013 2:40 PM [facility] [phone]   Future Orders Complete By Expires   ACE Inhibitor / ARB already ordered  As directed    Amb Referral to Cardiac Rehabilitation  As directed    Diet - low sodium heart healthy  As directed    Discharge instructions  As directed    Comments:     Scheduled for Cardioversion 10/01/13 @ 11:00 am. Come to admitting at 9:00 am.   Heart Failure patients record your daily weight using the same scale at the same time of day  As directed    Increase activity slowly  As directed    STOP any activity that causes chest pain, shortness of breath, dizziness, sweating, or exessive weakness  As directed      Follow-up Information   Follow up with [facility] On 10/01/2013. (@ 9:00 am for Cardioversion)    Contact information:   [address] [id number] [place] [address] [phone]        Duration of Discharge Encounter: Greater than 35 minutes   Signed, [name]  10/27/2013, 10:52 PM

## 2013-11-04 ENCOUNTER — Telehealth: Payer: Self-pay | Admitting: Cardiology

## 2013-11-04 NOTE — Telephone Encounter (Signed)
Please let patient know that she had moderate OSA on sleep study and needs to be set up for CPAP titration.  Please set patient up for CPAP titation and forward to Dr. Shirlee Latch as an Lorain Childes and make him aware that patient was in afib during the study

## 2013-11-05 NOTE — Telephone Encounter (Signed)
To Danielle.  

## 2013-11-06 NOTE — Telephone Encounter (Signed)
Pt to pt and made aware of the results. She did not think she had apnea. I explained that she went from 15.46 pauses an hour when she first started to sleep to 43.2 pauses an hour when she was in REM. Her Oxygen went from 91% to 80% as well. I explained that she did have pauses. She thought she was just a shallow breather and that could be what they picked up as pauses. I explained that I spoke with [facility] and Sleep and that they said the equipment would be able to tell the difference between the shallow breathing and the actual pause. Pt did not want to go any further with a CPAP titration until Dr. Jearld Pies saw the report and she talked with them about it. We are waiting for the actual study to be scanned in. To Dr. Mayford Knife to make aware.

## 2013-11-06 NOTE — Telephone Encounter (Signed)
Sending to Dr. Shirlee Latch to make aware of the below.

## 2013-11-06 NOTE — Telephone Encounter (Signed)
When I get the report, I will go over it with her.  With the afib, she needs to be on cpap.

## 2013-11-08 ENCOUNTER — Encounter: Payer: Self-pay | Admitting: Cardiology

## 2013-11-08 NOTE — Telephone Encounter (Signed)
This encounter was created in error - please disregard.

## 2013-11-15 ENCOUNTER — Encounter: Payer: Self-pay | Admitting: Internal Medicine

## 2013-11-15 ENCOUNTER — Ambulatory Visit (INDEPENDENT_AMBULATORY_CARE_PROVIDER_SITE_OTHER): Payer: Medicare Other | Admitting: Internal Medicine

## 2013-11-15 VITALS — BP 110/78 | HR 61 | Ht 63.0 in | Wt 248.1 lb

## 2013-11-15 DIAGNOSIS — I4891 Unspecified atrial fibrillation: Secondary | ICD-10-CM

## 2013-11-15 DIAGNOSIS — I5022 Chronic systolic (congestive) heart failure: Secondary | ICD-10-CM

## 2013-11-15 DIAGNOSIS — I509 Heart failure, unspecified: Secondary | ICD-10-CM

## 2013-11-15 NOTE — Patient Instructions (Addendum)
Your physician recommends that you continue on your current medications as directed. Please refer to the Current Medication list given to you today.  Your physician has requested that you have a TEE (pre-atrial fibrillation ablation). During a TEE, sound waves are used to create images of your heart. It provides your doctor with information about the size and shape of your heart and how well your heart's chambers and valves are working. In this test, a transducer is attached to the end of a flexible tube that's guided down your throat and into your esophagus (the tube leading from you mouth to your stomach) to get a more detailed image of your heart. You are not awake for the procedure. Please see the instruction sheet given to you today. For further information please visit [email or web address]  Your physician has recommended that you have an ablation. Catheter ablation is a medical procedure used to treat some cardiac arrhythmias (irregular heartbeats). During catheter ablation, a long, thin, flexible tube is put into a blood vessel in your groin (upper thigh), or neck. This tube is called an ablation catheter. It is then guided to your heart through the blood vessel. Radio frequency waves destroy small areas of heart tissue where abnormal heartbeats may cause an arrhythmia to start. Please see the instruction sheet given to you today.   Dr. Jenel Lucks nurse will be in contact to schedule these procedures. Her name is Dennis Bast   [phone]

## 2013-11-15 NOTE — Progress Notes (Signed)
Primary Care Physician: SPEAR, TAMMY, MD Referring Physician:  Dr McLean   Kelli Webster is a 66 y.o. female with a h/o obesity, nonischemic CM, and persistent atrial fibrillation who presents today for EP consultation.  She was initially diagnosed with atrial fibrillation 10/14 after presenting to Tammy Spears office for evaluation of reflux.  She was found to be in afib on ekg and was sent to the [place] [facility].  She was found to have a nonischemic CM (EF 20-25%) and afib with RVR.  She was rate controlled and anticoagulated with eliquis.  She returned for cardioversion 10/01/13.  She was successfully cardioverted to sinus but returned to afib quickly.  She was therefore placed on amiodarone and returned for repeat cardioversion 10/17/13.  She maintained sinus rhythm for 6 days.  With sinus she reports significant improvement in fatigue and exercise tolerance.  Upon return to afib, her energy decreased and she felt "wiped out".  She is continued on amiodarone for rate control and is referred to me for further evaluation.  Today, she feels "pretty good".  She has only rare palpitations.  she denies symptoms of chest pain, shortness of breath, orthopnea, PND, lower extremity edema, dizziness, presyncope, syncope, or neurologic sequela. The patient is tolerating medications without difficulties and is otherwise without complaint today.   Past Medical History  Diagnosis Date  . GERD (gastroesophageal reflux disease)     a. 09/2013 endoscopy nl  . Morbid obesity   . Persistent atrial fibrillation     a. Dx 09/2013  . Osteoarthritis   . Chronic systolic heart failure     a. ECHO (09/2013) EF 20-25% diff HK, mild/mod MR, LA mod dil, RV mildly dil, RA mildly dil b. R/LHC (09/26/13) AO 133/88, LV 137/17, Lmain no dz, LAD 30% prox, LCX mild luminal irreg; RCA mild luminal irreg   Past Surgical History  Procedure Laterality Date  . Esophagogastroduodenoscopy N/A 09/25/2013    Procedure:  ESOPHAGOGASTRODUODENOSCOPY (EGD);  Surgeon: Salem F Ganem, MD;  Location: [facility];  Service: Endoscopy;  Laterality: N/A;  . Arthroscopy       left knee    . Fx left wrist      open reduction & internal fixaction  . Tee without cardioversion N/A 10/01/2013    Procedure: TRANSESOPHAGEAL ECHOCARDIOGRAM (TEE);  Surgeon: Dalton S McLean, MD;  Location: [facility]COPY;  Service: Cardiovascular;  Laterality: N/A;  . Cardioversion N/A 10/01/2013    Procedure: CARDIOVERSION;  Surgeon: Dalton S McLean, MD;  Location: [facility];  Service: Cardiovascular;  Laterality: N/A;  . Cardioversion N/A 10/17/2013    Procedure: CARDIOVERSION;  Surgeon: Dalton S McLean, MD;  Location: [facility];  Service: Cardiovascular;  Laterality: N/A;    Current Outpatient Prescriptions  Medication Sig Dispense Refill  . amiodarone (PACERONE) 200 MG tablet Take 1 tablet (200 mg total) by mouth daily.  30 tablet  6  . apixaban (ELIQUIS) 5 MG TABS tablet Take 1 tablet (5 mg total) by mouth 2 (two) times daily.  180 tablet  6  . carvedilol (COREG) 12.5 MG tablet Take 1 tablet (12.5 mg total) by mouth 2 (two) times daily with a meal.  180 tablet  6  . digoxin (LANOXIN) 0.125 MG tablet Take 1 tablet (0.125 mg total) by mouth every other day.  90 tablet  6  . famotidine (PEPCID) 20 MG tablet Take 1 tablet (20 mg total) by mouth 2 (two) times daily.  180 tablet  6  . losartan (COZAAR) 25 MG   tablet Take 1 tablet (25 mg total) by mouth daily.  30 tablet  6  . spironolactone (ALDACTONE) 25 MG tablet Take 1 tablet (25 mg total) by mouth daily.  90 tablet  6   No current facility-administered medications for this visit.    No Known Allergies  History   Social History  . Marital Status: Married    Spouse Name: N/A    Number of Children: N/A  . Years of Education: N/A   Occupational History  . Not on file.   Social History Main Topics  . Smoking status: Never Smoker   . Smokeless tobacco: Not on file  . Alcohol  Use: No  . Drug Use: No  . Sexual Activity: Not on file   Other Topics Concern  . Not on file   Social History Narrative   Lives in [place] with husband.  Does not routinely exercise.   Realtor with [employer]    Family History  Problem Relation Age of Onset  . Heart attack Father     died @ 57, multiple MI's  . Other Mother     alive and well in her 80's.  . Other Brother     A & W  . Other Sister     A & W  . Other Sister     A & W    ROS- All systems are reviewed and negative except as per the HPI above  Physical Exam: Filed Vitals:   11/15/13 1041  BP: 110/78  Pulse: 61  Height: 5' 3" (1.6 m)  Weight: 248 lb 1.9 oz (112.546 kg)    GEN- The patient is overweight appearing, alert and oriented x 3 today.   Head- normocephalic, atraumatic Eyes-  Sclera clear, conjunctiva pink Ears- hearing intact Oropharynx- clear Neck- supple,   Lungs- Clear to ausculation bilaterally, normal work of breathing Heart- irregular rate and rhythm, no murmurs, rubs or gallops, PMI not laterally displaced GI- soft, NT, ND, + BS Extremities- no clubbing, cyanosis, or edema MS- no significant deformity or atrophy Skin- no rash or lesion Psych- euthymic mood, full affect Neuro- strength and sensation are intact  EKG today reveals afib, V rate 61 bpm, LPHB, nonspecific ST/T changes, Qtc 380 msec Echo is reviewed  Assessment and Plan:  1. Atrial fibrillation The patient has symptomatic persistent atrial fibrillation.  She has failed medical therapy with amiodarone.  Therapeutic strategies for afib including medicine and ablation were discussed in detail with the patient today. Risk, benefits, and alternatives to EP study and radiofrequency ablation for afib were also discussed in detail today. These risks include but are not limited to stroke, bleeding, vascular damage, tamponade, perforation, damage to the esophagus, lungs, and other structures, pulmonary vein stenosis, worsening renal  function, and death. The patient understands these risk and wishes to proceed.  She is aware that her anticipated success from ablation is 60% and may require multiple procedures.  We will therefore proceed with catheter ablation at the next available time.  2. OSA She is diagnosed with moderate sleep apnea per Dr Turner, though I do not have the report to review. Though the study was ordered as a split study, she does not appear to have had CPAP titrated during the study.  She is now reluctant to return for CPAP initiation until she speaks with Dr McLean.  I think her concerns are reasonable.  I have spoken with Dalton who is happy to call her once he has the results of   her sleep study available (not presently in epic).  3. Obesity Weight loss is advised I have reviewed the patients BMI and decreased success rates with ablation at length today.  Weight loss is strongly advised.  Per Guijian et al (PACE 2013; 36: 748-756), patients with BMI 25-29.9 (obese) have a 27% increase in AF recurrence post ablation.  Patients with BMI >30 have a 31% increase in AF recurrence post ablation when compared to those with BMI <25.    

## 2013-11-20 ENCOUNTER — Telehealth: Payer: Self-pay | Admitting: Internal Medicine

## 2013-11-20 ENCOUNTER — Other Ambulatory Visit: Payer: Self-pay | Admitting: *Deleted

## 2013-11-20 ENCOUNTER — Encounter (HOSPITAL_COMMUNITY): Payer: Self-pay | Admitting: Pharmacy Technician

## 2013-11-20 ENCOUNTER — Encounter: Payer: Self-pay | Admitting: *Deleted

## 2013-11-20 DIAGNOSIS — I4891 Unspecified atrial fibrillation: Secondary | ICD-10-CM

## 2013-11-20 NOTE — Telephone Encounter (Signed)
Spoke with patient she is coming Fri 11/22/13 for labs and instruction sheet

## 2013-11-20 NOTE — Telephone Encounter (Signed)
Follow Up  Pt requests a call back to discuss the ablation that was scheduled for 12/9// she requests confirmation that everything is still on track. Please call

## 2013-11-22 ENCOUNTER — Other Ambulatory Visit (INDEPENDENT_AMBULATORY_CARE_PROVIDER_SITE_OTHER): Payer: Medicare Other

## 2013-11-22 DIAGNOSIS — I4891 Unspecified atrial fibrillation: Secondary | ICD-10-CM

## 2013-11-22 LAB — BASIC METABOLIC PANEL
BUN: 25 mg/dL — ABNORMAL HIGH (ref 6–23)
CO2: 27 mEq/L (ref 19–32)
Calcium: 8.9 mg/dL (ref 8.4–10.5)
Chloride: 104 mEq/L (ref 96–112)
Creatinine, Ser: 1.4 mg/dL — ABNORMAL HIGH (ref 0.4–1.2)
GFR: 38.59 mL/min — ABNORMAL LOW (ref >60.00)
Glucose, Bld: 145 mg/dL — ABNORMAL HIGH (ref 70–99)
Potassium: 4.6 mEq/L (ref 3.5–5.1)
Sodium: 137 mEq/L (ref 135–145)

## 2013-11-22 LAB — CBC WITH DIFFERENTIAL/PLATELET
Basophils Absolute: 0.1 10*3/uL (ref 0.0–0.1)
Basophils Relative: 0.9 % (ref 0.0–3.0)
Eosinophils Absolute: 0.1 10*3/uL (ref 0.0–0.7)
Eosinophils Relative: 1.6 % (ref 0.0–5.0)
HCT: 37.8 % (ref 36.0–46.0)
Hemoglobin: 12.5 g/dL (ref 12.0–15.0)
Lymphocytes Relative: 20.8 % (ref 12.0–46.0)
Lymphs Abs: 1.3 10*3/uL (ref 0.7–4.0)
MCHC: 33.2 g/dL (ref 30.0–36.0)
MCV: 83.6 fl (ref 78.0–100.0)
Monocytes Absolute: 0.4 10*3/uL (ref 0.1–1.0)
Monocytes Relative: 6.6 % (ref 3.0–12.0)
Neutro Abs: 4.3 10*3/uL (ref 1.4–7.7)
Neutrophils Relative %: 70.1 % (ref 43.0–77.0)
Platelets: 323 10*3/uL (ref 150.0–400.0)
RBC: 4.52 Mil/uL (ref 3.87–5.11)
RDW: 19.1 % — ABNORMAL HIGH (ref 11.5–14.6)
WBC: 6.2 10*3/uL (ref 4.5–10.5)

## 2013-11-26 ENCOUNTER — Encounter (HOSPITAL_COMMUNITY)
Admission: RE | Disposition: A | Discharge status: Home / Self Care | Payer: Self-pay | Source: Ambulatory Visit | Attending: Internal Medicine

## 2013-11-26 ENCOUNTER — Ambulatory Visit (HOSPITAL_COMMUNITY): Payer: Medicare Other | Admitting: Certified Registered Nurse Anesthetist

## 2013-11-26 ENCOUNTER — Ambulatory Visit (HOSPITAL_COMMUNITY)
Admission: RE | Admit: 2013-11-26 | Discharge: 2013-11-27 | Disposition: A | Discharge status: Home / Self Care | Payer: Medicare Other | Source: Ambulatory Visit | Attending: Internal Medicine | Admitting: Internal Medicine

## 2013-11-26 ENCOUNTER — Encounter (HOSPITAL_COMMUNITY): Payer: Self-pay | Admitting: Certified Registered Nurse Anesthetist

## 2013-11-26 ENCOUNTER — Encounter (HOSPITAL_COMMUNITY): Payer: Medicare Other | Admitting: Certified Registered Nurse Anesthetist

## 2013-11-26 DIAGNOSIS — Z79899 Other long term (current) drug therapy: Secondary | ICD-10-CM | POA: Insufficient documentation

## 2013-11-26 DIAGNOSIS — G4733 Obstructive sleep apnea (adult) (pediatric): Secondary | ICD-10-CM | POA: Insufficient documentation

## 2013-11-26 DIAGNOSIS — I5022 Chronic systolic (congestive) heart failure: Secondary | ICD-10-CM | POA: Insufficient documentation

## 2013-11-26 DIAGNOSIS — K219 Gastro-esophageal reflux disease without esophagitis: Secondary | ICD-10-CM | POA: Insufficient documentation

## 2013-11-26 DIAGNOSIS — I4891 Unspecified atrial fibrillation: Secondary | ICD-10-CM

## 2013-11-26 DIAGNOSIS — I428 Other cardiomyopathies: Secondary | ICD-10-CM | POA: Insufficient documentation

## 2013-11-26 DIAGNOSIS — Z7901 Long term (current) use of anticoagulants: Secondary | ICD-10-CM | POA: Insufficient documentation

## 2013-11-26 DIAGNOSIS — Z6841 Body Mass Index (BMI) 40.0 and over, adult: Secondary | ICD-10-CM | POA: Insufficient documentation

## 2013-11-26 HISTORY — PX: ATRIAL FIBRILLATION ABLATION: SHX5456

## 2013-11-26 HISTORY — PX: TEE WITHOUT CARDIOVERSION: SHX5443

## 2013-11-26 HISTORY — PX: ABLATION: SHX5711

## 2013-11-26 LAB — POCT ACTIVATED CLOTTING TIME
Activated Clotting Time: 252 seconds
Activated Clotting Time: 263 seconds
Activated Clotting Time: 288 seconds
Activated Clotting Time: 293 seconds
Activated Clotting Time: 171 seconds

## 2013-11-26 LAB — MRSA PCR SCREENING: MRSA by PCR: NEGATIVE

## 2013-11-26 SURGERY — ECHOCARDIOGRAM, TRANSESOPHAGEAL
Anesthesia: Moderate Sedation

## 2013-11-26 SURGERY — ATRIAL FIBRILLATION ABLATION
Anesthesia: General

## 2013-11-26 MED ORDER — CARVEDILOL 12.5 MG PO TABS
12.5000 mg | ORAL_TABLET | Freq: Two times a day (BID) | ORAL | Status: DC
  Administered 2013-11-26 – 2013-11-27 (×2): 12.5 mg via ORAL
  Filled 2013-11-26 (×4): qty 1

## 2013-11-26 MED ORDER — MIDAZOLAM HCL 10 MG/2ML IJ SOLN
INTRAMUSCULAR | Status: DC | PRN
  Administered 2013-11-26 (×2): 2 mg via INTRAVENOUS
  Administered 2013-11-26 (×2): 1 mg via INTRAVENOUS

## 2013-11-26 MED ORDER — HYDROCODONE-ACETAMINOPHEN 5-325 MG PO TABS
1.0000 | ORAL_TABLET | ORAL | Status: DC | PRN
  Administered 2013-11-27: 2 via ORAL
  Filled 2013-11-26: qty 2

## 2013-11-26 MED ORDER — HEPARIN SODIUM (PORCINE) 1000 UNIT/ML IJ SOLN
INTRAMUSCULAR | Status: AC
  Filled 2013-11-26: qty 1

## 2013-11-26 MED ORDER — ONDANSETRON HCL 4 MG/2ML IJ SOLN
INTRAMUSCULAR | Status: DC | PRN
  Administered 2013-11-26: 4 mg via INTRAVENOUS

## 2013-11-26 MED ORDER — ONDANSETRON HCL 4 MG/2ML IJ SOLN: 4.0000 mg | Freq: Four times a day (QID) | INTRAMUSCULAR | Status: DC | PRN

## 2013-11-26 MED ORDER — LOSARTAN POTASSIUM 25 MG PO TABS
25.0000 mg | ORAL_TABLET | Freq: Every day | ORAL | Status: DC
  Administered 2013-11-27: 25 mg via ORAL
  Filled 2013-11-26: qty 1

## 2013-11-26 MED ORDER — DIPHENHYDRAMINE HCL 50 MG/ML IJ SOLN
INTRAMUSCULAR | Status: AC
  Filled 2013-11-26: qty 1

## 2013-11-26 MED ORDER — SODIUM CHLORIDE 0.9 % IV SOLN: 250.0000 mL | INTRAVENOUS | Status: DC | PRN

## 2013-11-26 MED ORDER — AMIODARONE HCL 200 MG PO TABS
200.0000 mg | ORAL_TABLET | Freq: Every day | ORAL | Status: DC
  Administered 2013-11-26 – 2013-11-27 (×2): 200 mg via ORAL
  Filled 2013-11-26 (×2): qty 1

## 2013-11-26 MED ORDER — HEPARIN SODIUM (PORCINE) 1000 UNIT/ML IJ SOLN
INTRAMUSCULAR | Status: DC | PRN
  Administered 2013-11-26: 2000 [IU] via INTRAVENOUS
  Administered 2013-11-26: 12000 [IU] via INTRAVENOUS
  Administered 2013-11-26: 3000 [IU] via INTRAVENOUS
  Administered 2013-11-26: 4000 [IU] via INTRAVENOUS

## 2013-11-26 MED ORDER — LIDOCAINE HCL (CARDIAC) 10 MG/ML IV SOLN
INTRAVENOUS | Status: DC | PRN
  Administered 2013-11-26: 50 mg via INTRAVENOUS

## 2013-11-26 MED ORDER — FAMOTIDINE 20 MG PO TABS
20.0000 mg | ORAL_TABLET | Freq: Two times a day (BID) | ORAL | Status: DC
  Administered 2013-11-26 – 2013-11-27 (×2): 20 mg via ORAL
  Filled 2013-11-26 (×3): qty 1

## 2013-11-26 MED ORDER — SODIUM CHLORIDE 0.9 % IV SOLN
INTRAVENOUS | Status: DC
  Administered 2013-11-26 (×2): via INTRAVENOUS

## 2013-11-26 MED ORDER — EPHEDRINE SULFATE 50 MG/ML IJ SOLN
INTRAMUSCULAR | Status: DC | PRN
  Administered 2013-11-26: 5 mg via INTRAVENOUS

## 2013-11-26 MED ORDER — MIDAZOLAM HCL 5 MG/5ML IJ SOLN
INTRAMUSCULAR | Status: DC | PRN
  Administered 2013-11-26: 2 mg via INTRAVENOUS

## 2013-11-26 MED ORDER — PROTAMINE SULFATE 10 MG/ML IV SOLN
INTRAVENOUS | Status: DC | PRN
  Administered 2013-11-26: 40 mg via INTRAVENOUS

## 2013-11-26 MED ORDER — BUPIVACAINE HCL (PF) 0.25 % IJ SOLN
INTRAMUSCULAR | Status: AC
  Filled 2013-11-26: qty 30

## 2013-11-26 MED ORDER — BUTAMBEN-TETRACAINE-BENZOCAINE 2-2-14 % EX AERO
INHALATION_SPRAY | CUTANEOUS | Status: DC | PRN
  Administered 2013-11-26: 2 via TOPICAL

## 2013-11-26 MED ORDER — APIXABAN 5 MG PO TABS
5.0000 mg | ORAL_TABLET | Freq: Two times a day (BID) | ORAL | Status: DC
  Administered 2013-11-26 – 2013-11-27 (×2): 5 mg via ORAL
  Filled 2013-11-26 (×3): qty 1

## 2013-11-26 MED ORDER — SPIRONOLACTONE 25 MG PO TABS
25.0000 mg | ORAL_TABLET | Freq: Every day | ORAL | Status: DC
  Administered 2013-11-27: 25 mg via ORAL
  Filled 2013-11-26: qty 1

## 2013-11-26 MED ORDER — LACTATED RINGERS IV SOLN
INTRAVENOUS | Status: DC | PRN
  Administered 2013-11-26: 12:00:00 via INTRAVENOUS

## 2013-11-26 MED ORDER — MIDAZOLAM HCL 5 MG/ML IJ SOLN
INTRAMUSCULAR | Status: AC
  Filled 2013-11-26: qty 2

## 2013-11-26 MED ORDER — FENTANYL CITRATE 0.05 MG/ML IJ SOLN
INTRAMUSCULAR | Status: AC
  Filled 2013-11-26: qty 2

## 2013-11-26 MED ORDER — SODIUM CHLORIDE 0.9 % IJ SOLN
3.0000 mL | Freq: Two times a day (BID) | INTRAMUSCULAR | Status: DC
  Administered 2013-11-26 – 2013-11-27 (×2): 3 mL via INTRAVENOUS

## 2013-11-26 MED ORDER — ACETAMINOPHEN 325 MG PO TABS: 650.0000 mg | ORAL_TABLET | ORAL | Status: DC | PRN

## 2013-11-26 MED ORDER — SODIUM CHLORIDE 0.9 % IJ SOLN: 3.0000 mL | INTRAMUSCULAR | Status: DC | PRN

## 2013-11-26 MED ORDER — FENTANYL CITRATE 0.05 MG/ML IJ SOLN
INTRAMUSCULAR | Status: DC | PRN
  Administered 2013-11-26 (×2): 25 ug via INTRAVENOUS

## 2013-11-26 MED ORDER — PROPOFOL 10 MG/ML IV BOLUS
INTRAVENOUS | Status: DC | PRN
  Administered 2013-11-26: 160 mg via INTRAVENOUS

## 2013-11-26 NOTE — Transfer of Care (Signed)
Immediate Anesthesia Transfer of Care Note  Patient: Kelli Webster  Procedure(s) Performed: Procedure(s): ATRIAL FIBRILLATION ABLATION (N/A)  Patient Location: PACU  Anesthesia Type:General  Level of Consciousness: awake, alert , oriented and patient cooperative  Airway & Oxygen Therapy: Patient Spontanous Breathing  Post-op Assessment: Report given to PACU RN, Post -op Vital signs reviewed and stable and Patient moving all extremities  Post vital signs: Reviewed and stable  Complications: No apparent anesthesia complications

## 2013-11-26 NOTE — Op Note (Signed)
SURGEON:  Hillis Range, MD  PREPROCEDURE DIAGNOSES: 1. Persistent atrial fibrillation.  POSTPROCEDURE DIAGNOSES: 1. Persistent atrial fibrillation.  PROCEDURES: 1. Comprehensive electrophysiologic study. 2. Coronary sinus pacing and recording. 3. Three-dimensional mapping of atrial fibrillation (with additional mapping and ablation of additional left atrial foci) 4. Ablation of atrial fibrillation(with additional mapping and ablation of additional left atrial foci) 5. Intracardiac echocardiography. 6. Transseptal puncture of an intact septum. 7. Arrhythmia induction with pacing 8. External cardioversion.  INTRODUCTION:  Kelli Webster is a 66 y.o. female with a history of persistent atrial fibrillation who now presents for EP study and radiofrequency ablation.  The patient reports initially being diagnosed with atrial fibrillation after presenting with symptomatic palpitations and fatgiue.  She has a nonischemic CM.  The patient has failed medical therapy with amiodarone.  The patient therefore presents today for catheter ablation of atrial fibrillation.  DESCRIPTION OF PROCEDURE:  Informed written consent was obtained, and the patient was brought to the electrophysiology lab in a fasting state.  The patient was adequately sedated with intravenous medications as outlined in the anesthesia report.  The patient's left and right groins were prepped and draped in the usual sterile fashion by the EP lab staff.  Using a percutaneous Seldinger technique, two 7-French and one 11-French hemostasis sheaths were placed into the right common femoral vein.    Rotational angiography/ pulmonary venography was not performed due to recent difficulties with renal failure  Catheter Placement:  A 7-French Biosense Webster Decapolar coronary sinus catheter was introduced through the right common femoral vein and advanced into the coronary sinus for recording and pacing from this location.  A 6-French quadripolar  Josephson catheter was introduced through the right common femoral vein and advanced into the right ventricle for recording and pacing.  This catheter was then pulled back to the His bundle location.    Initial Measurements: The patient presented to the electrophysiology lab in atrial fibrillation. Her average ventricular rate was 70s.  The HV interval 60 msec.     Intracardiac Echocardiography: A 10-French Biosense Webster AcuNav intracardiac echocardiography catheter was introduced through the left common femoral vein and advanced into the right atrium. Intracardiac echocardiography was performed of the left atrium, and a three-dimensional anatomical rendering of the left atrium was performed using CARTO sound technology.  The patient was noted to have a large sized left atrium.  The interatrial septum was prominent but not aneurysmal. All 4 pulmonary veins were visualized.  There was a short common segment to the left inferior and left superior pulmonary veins.  The pulmonary veins were moderate in size.  The left atrial appendage was visualized and did not reveal thrombus.   There was no evidence of pulmonary vein stenosis.   Transseptal Puncture: The middle right common femoral vein sheath was exchanged for an 8.5 [place] SL2 transseptal sheath and transseptal access was achieved in a standard fashion using a Brockenbrough needle under biplane fluoroscopy with intracardiac echocardiography confirmation of the transseptal puncture.  Once transseptal access had been achieved, heparin was administered intravenously and intra- arterially in order to maintain an ACT of greater than 300 seconds throughout the procedure.   3D Mapping and Ablation: The His bundle catheter was removed and in its place a 3.5 mm [employer] Thermocool ablation catheter was advanced into the right atrium.  The transseptal sheath was pulled back into the IVC over a guidewire.  The ablation catheter was advanced  across the transseptal hole using the wire as a  guide.  The transseptal sheath was then re-advanced over the guidewire into the left atrium.  A duodecapolar Biosense Webster circular mapping catheter was introduced through the transseptal sheath and positioned over the mouth of all 4 pulmonary veins.  Three-dimensional electroanatomical mapping was performed using CARTO technology.  This demonstrated electrical activity within all four pulmonary veins at baseline. The patient underwent successful sequential electrical isolation and anatomical encircling of all four pulmonary veins using radiofrequency current with a circular mapping catheter as a guide using a WACA approach. Complex fractionated electrograms (CFAEs) were then identified and ablated along the roof of the left atrium, base of the left atrial appendage (LAA),  along the ridge between the left superior pulmonary vein and the left atrial appendage, the interatrial septum, along the lateral wall of the left atrium, and above the coronary sinus.    Cardioversion: The patient was then cardioverted to sinus rhythm with a single synchronized 360-J biphasic shock with cardioversion electrodes in the anterior-posterior thoracic configuration.  He remained in sinus rhythm thereafter.  Measurements Following Ablation: In sinus rhythm the RR interval was 1296 msec, with PR 198 msec, QRS 111 msec, and Qtc 430 msec.  Following ablation the AH interval measured 85 msec with an HV interval of 61 msec. Ventricular pacing was performed, which revealed midline decremental VA conduction with a VA Wenckebach cycle length of 640 msec.  Rapid atrial pacing was performed, which revealed an AV Wenckebach cycle length of 470 msec.  Electroisolation was then again confirmed in all four pulmonary veins.  The procedure was therefore considered completed.  All catheters were removed, and the sheaths were aspirated and flushed.  The patient was transferred to the recovery  area for sheath removal per protocol.  A limited bedside transthoracic echocardiogram revealed no pericardial effusion.  There were no early apparent complications.  CONCLUSIONS: 1. Atrial fibrillation upon presentation.   2. Intracardiac echo reveals a large sized left atrium with a short common segment to the left sided pulmonary veins 3. Successful electrical isolation and anatomical encircling of all four pulmonary veins using a WACA approach. 3. CFAEs were identified and ablated along the roof of the left atrium, base of the left atrial appendage (LAA),  along the ridge between the left superior pulmonary vein and the left atrial appendage, the interatrial septum, along the lateral wall of the left atrium, and above the coronary sinus.   4. Atrial fibrillation successfully cardioverted to sinus rhythm. 5. No early apparent complications.   Helga Asbury,MD 3:40 PM 11/26/2013

## 2013-11-26 NOTE — Anesthesia Postprocedure Evaluation (Signed)
  Anesthesia Post-op Note  Patient: Kelli Webster  Procedure(s) Performed: Procedure(s): ATRIAL FIBRILLATION ABLATION (N/A)  Patient Location: PACU  Anesthesia Type:General  Level of Consciousness: awake, alert , oriented and patient cooperative  Airway and Oxygen Therapy: Patient Spontanous Breathing  Post-op Pain: none  Post-op Assessment: Post-op Vital signs reviewed, Patient's Cardiovascular Status Stable, Respiratory Function Stable, Patent Airway, No signs of Nausea or vomiting and Pain level controlled  Post-op Vital Signs: stable  Complications: No apparent anesthesia complications

## 2013-11-26 NOTE — Interval H&P Note (Signed)
History and Physical Interval Note:  11/26/2013 11:12 AM  Kelli Webster  has presented today for surgery, with the diagnosis of A-FIB  The various methods of treatment have been discussed with the patient and family. After consideration of risks, benefits and other options for treatment, the patient has consented to  Procedure(s): ATRIAL FIBRILLATION ABLATION (N/A) as a surgical intervention .  The patient's history has been reviewed, patient examined, no change in status, stable for surgery.  I have reviewed the patient's chart and labs.  Questions were answered to the patient's satisfaction.     Hillis Range

## 2013-11-26 NOTE — Progress Notes (Signed)
Patient to cath lab for ablation report given to [facility]

## 2013-11-26 NOTE — Anesthesia Procedure Notes (Signed)
Procedure Name: LMA Insertion Date/Time: 11/26/2013 12:14 PM Performed by: Vita Barley E Pre-anesthesia Checklist: Patient identified, Emergency Drugs available, Suction available and Patient being monitored Patient Re-evaluated:Patient Re-evaluated prior to inductionOxygen Delivery Method: Circle system utilized Preoxygenation: Pre-oxygenation with 100% oxygen Intubation Type: IV induction Ventilation: Mask ventilation without difficulty LMA: LMA with gastric port inserted LMA Size: 4.0 Number of attempts: 1 Placement Confirmation: positive ETCO2 and breath sounds checked- equal and bilateral Tube secured with: Tape Dental Injury: Teeth and Oropharynx as per pre-operative assessment

## 2013-11-26 NOTE — Discharge Summary (Signed)
ELECTROPHYSIOLOGY PROCEDURE DISCHARGE SUMMARY    Patient ID: Kelli Webster,  MRN: [id number], DOB/AGE: [id number] y.o.  Admit date: 11/26/2013 Discharge date: 11/27/2013  Primary Care Physician: Herb Grays, MD Primary Cardiologist: [name], MD Electrophysiologist: Hillis Range, MD  Primary Discharge Diagnosis:  Persistent atrial fibrillation status post ablation this admission  Secondary Discharge Diagnosis:  1.  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  Obesity 3.  GERD 4.  Chronic systolic heart failure  Procedures This Admission:  1.  Electrophysiology study and radiofrequency catheter ablation on 11-26-2013 by Dr Hillis Range.  This study demonstrated atrial fibrillation upon presentation; intracardiac echo reveals a large sized left atrium with a short common segment to the left sided pulmonary veins; successful electrical isolation and anatomical encircling of all four pulmonary veins using a WACA approach; CFAEs were identified and ablated along the roof of the left atrium, base of the left atrial appendage (LAA), along the ridge between the left superior pulmonary vein and the left atrial appendage, the interatrial septum, along the lateral wall of the left atrium, and above the coronary sinus; atrial fibrillation successfully cardioverted to sinus rhythm.  There were no early apparent complications.   Brief HPI: Kelli Webster is a 67 y.o. female with a history of non ischemic cardiomyopathy and persistent atrial fibrillation.  They have failed medical therapy with amiodarone. Risks, benefits, and alternatives to catheter ablation of atrial fibrillation were reviewed with the patient who wished to proceed.  The patient underwent TEE prior to the procedure which demonstrated an EF of 20-25% and no LAA thrombus.    Hospital Course:  The patient was admitted and underwent EPS/RFCA of atrial fibrillation with details as outlined above.  They were  monitored on telemetry overnight which demonstrated sinus rhythm with a short run of atrial tach.  Groin was without complication on the day of discharge.  The patient was examined by Dr Johney Frame and considered to be stable for discharge.  Wound care and restrictions were reviewed with the patient.  The patient will be seen back by Dr Johney Frame in 12 weeks for post ablation follow up.  Her creatinine was slightly elevated above baseline prior to procedure at 1.4 - therefore no contrast was used.   Physical Exam: Filed Vitals:   11/27/13 0344 11/27/13 0410 11/27/13 0609 11/27/13 0821  BP: 127/38   132/48  Pulse: 51     Temp:  97.9 F (36.6 C)  97.5 F (36.4 C)  TempSrc:  Oral  Oral  Resp: 17  22   Height:      Weight:      SpO2: 95%   96%    GEN- The patient is well appearing, alert and oriented x 3 today.   Head- normocephalic, atraumatic Eyes-  Sclera clear, conjunctiva pink Ears- hearing intact Oropharynx- clear Neck- supple, no JVP Lymph- no cervical lymphadenopathy Lungs- Clear to ausculation bilaterally, normal work of breathing Heart- Regular rate and rhythm, no murmurs, rubs or gallops, PMI not laterally displaced GI- soft, NT, ND, + BS Extremities- no clubbing, cyanosis, or edema MS- no significant deformity or atrophy Skin- no rash or lesion Psych- euthymic mood, full affect Neuro- strength and sensation are intact  Labs:   Lab Results  Component Value Date   WBC 6.2 11/22/2013   HGB 12.5 11/22/2013   HCT 37.8 11/22/2013   MCV 83.6 11/22/2013   PLT 323.0 11/22/2013     Recent Labs Lab 11/27/13 0345  NA  136  K 4.2  CL 103  CO2 25  BUN 23  CREATININE 1.44*  CALCIUM 8.3*  GLUCOSE 106*      Discharge Medications:    Medication List    ASK your doctor about these medications       amiodarone 200 MG tablet  Commonly known as:  PACERONE  Take 1 tablet (200 mg total) by mouth daily.     apixaban 5 MG Tabs tablet  Commonly known as:  ELIQUIS  Take 1 tablet  (5 mg total) by mouth 2 (two) times daily.     carvedilol 12.5 MG tablet  Commonly known as:  COREG  Take 1 tablet (12.5 mg total) by mouth 2 (two) times daily with a meal.     digoxin 0.125 MG tablet  Commonly known as:  LANOXIN  Take 1 tablet (0.125 mg total) by mouth every other day.     famotidine 20 MG tablet  Commonly known as:  PEPCID  Take 1 tablet (20 mg total) by mouth 2 (two) times daily.     losartan 25 MG tablet  Commonly known as:  COZAAR  Take 1 tablet (25 mg total) by mouth daily.     spironolactone 25 MG tablet  Commonly known as:  ALDACTONE  Take 1 tablet (25 mg total) by mouth daily.        Disposition:   Future Appointments Provider Department Dept Phone   03/10/2014 10:15 AM Hillis Range, MD [facility] Office [phone]       Duration of Discharge Encounter: Greater than 30 minutes including physician time.  Signed,  Hillis Range MD

## 2013-11-26 NOTE — H&P (View-Only) (Signed)
Primary Care Physician: Herb Grays, MD Referring Physician:  Dr Mar Daring is a 67 y.o. female with a h/o obesity, nonischemic CM, and persistent atrial fibrillation who presents today for EP consultation.  She was initially diagnosed with atrial fibrillation 10/14 after presenting to [facility] office for evaluation of reflux.  She was found to be in afib on ekg and was sent to the [facility] ER.  She was found to have a nonischemic CM (EF 20-25%) and afib with RVR.  She was rate controlled and anticoagulated with eliquis.  She returned for cardioversion 10/01/13.  She was successfully cardioverted to sinus but returned to afib quickly.  She was therefore placed on amiodarone and returned for repeat cardioversion 10/17/13.  She maintained sinus rhythm for 6 days.  With sinus she reports significant improvement in fatigue and exercise tolerance.  Upon return to afib, her energy decreased and she felt "wiped out".  She is continued on amiodarone for rate control and is referred to me for further evaluation.  Today, she feels "pretty good".  She has only rare palpitations.  she denies symptoms of chest pain, shortness of breath, orthopnea, PND, lower extremity edema, dizziness, presyncope, syncope, or neurologic sequela. The patient is tolerating medications without difficulties and is otherwise without complaint today.   Past Medical History  Diagnosis Date  . GERD (gastroesophageal reflux disease)     a. 09/2013 endoscopy nl  . Morbid obesity   . Persistent atrial fibrillation     a. Dx 09/2013  . Osteoarthritis   . Chronic systolic heart failure     a. ECHO (09/2013) EF 20-25% diff HK, mild/mod MR, LA mod dil, RV mildly dil, RA mildly dil b. R/LHC (09/26/13) AO 133/88, LV 137/17, Lmain no dz, LAD 30% prox, LCX mild luminal irreg; RCA mild luminal irreg   Past Surgical History  Procedure Laterality Date  . Esophagogastroduodenoscopy N/A 09/25/2013    Procedure:  ESOPHAGOGASTRODUODENOSCOPY (EGD);  Surgeon: Graylin Shiver, MD;  Location: [facility] ENDOSCOPY;  Service: Endoscopy;  Laterality: N/A;  . Arthroscopy       left knee    . Fx left wrist      open reduction & internal fixaction  . Tee without cardioversion N/A 10/01/2013    Procedure: TRANSESOPHAGEAL ECHOCARDIOGRAM (TEE);  Surgeon: Laurey Morale, MD;  Location: [facility] ENDOSCOPY;  Service: Cardiovascular;  Laterality: N/A;  . Cardioversion N/A 10/01/2013    Procedure: CARDIOVERSION;  Surgeon: Laurey Morale, MD;  Location: [facility] ENDOSCOPY;  Service: Cardiovascular;  Laterality: N/A;  . Cardioversion N/A 10/17/2013    Procedure: CARDIOVERSION;  Surgeon: Laurey Morale, MD;  Location: [facility] ENDOSCOPY;  Service: Cardiovascular;  Laterality: N/A;    Current Outpatient Prescriptions  Medication Sig Dispense Refill  . amiodarone (PACERONE) 200 MG tablet Take 1 tablet (200 mg total) by mouth daily.  30 tablet  6  . apixaban (ELIQUIS) 5 MG TABS tablet Take 1 tablet (5 mg total) by mouth 2 (two) times daily.  180 tablet  6  . carvedilol (COREG) 12.5 MG tablet Take 1 tablet (12.5 mg total) by mouth 2 (two) times daily with a meal.  180 tablet  6  . digoxin (LANOXIN) 0.125 MG tablet Take 1 tablet (0.125 mg total) by mouth every other day.  90 tablet  6  . famotidine (PEPCID) 20 MG tablet Take 1 tablet (20 mg total) by mouth 2 (two) times daily.  180 tablet  6  . losartan (COZAAR) 25 MG  tablet Take 1 tablet (25 mg total) by mouth daily.  30 tablet  6  . spironolactone (ALDACTONE) 25 MG tablet Take 1 tablet (25 mg total) by mouth daily.  90 tablet  6   No current facility-administered medications for this visit.    No Known Allergies  History   Social History  . Marital Status: Married    Spouse Name: N/A    Number of Children: N/A  . Years of Education: N/A   Occupational History  . Not on file.   Social History Main Topics  . Smoking status: Never Smoker   . Smokeless tobacco: Not on file  . Alcohol  Use: No  . Drug Use: No  . Sexual Activity: Not on file   Other Topics Concern  . Not on file   Social History Narrative   Lives in [place] with husband.  Does not routinely exercise.   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 Heart attack Father     died @ 78, multiple MI's  . Other Mother     alive and well in her 64's.  . Other Brother     A & W  . Other Sister     A & W  . Other Sister     A & W    ROS- All systems are reviewed and negative except as per the HPI above  Physical Exam: Filed Vitals:   11/15/13 1041  BP: 110/78  Pulse: 61  Height: 5\' 3"  (1.6 m)  Weight: 248 lb 1.9 oz (112.546 kg)    GEN- The patient is overweight appearing, alert and oriented x 3 today.   Head- normocephalic, atraumatic Eyes-  Sclera clear, conjunctiva pink Ears- hearing intact Oropharynx- clear Neck- supple,   Lungs- Clear to ausculation bilaterally, normal work of breathing Heart- irregular rate and rhythm, no murmurs, rubs or gallops, PMI not laterally displaced GI- soft, NT, ND, + BS Extremities- no clubbing, cyanosis, or edema MS- no significant deformity or atrophy Skin- no rash or lesion Psych- euthymic mood, full affect Neuro- strength and sensation are intact  EKG today reveals afib, V rate 61 bpm, LPHB, nonspecific ST/T changes, Qtc 380 msec Echo is reviewed  Assessment and Plan:  1. Atrial fibrillation The patient has symptomatic persistent atrial fibrillation.  She has failed medical therapy with amiodarone.  Therapeutic strategies for afib including medicine and ablation were discussed in detail with the patient today. Risk, benefits, and alternatives to EP study and radiofrequency ablation for afib were also discussed in detail today. These risks include but are not limited to stroke, bleeding, vascular damage, tamponade, perforation, damage to the esophagus, lungs, and other structures, pulmonary vein stenosis, worsening renal  function, and death. The patient understands these risk and wishes to proceed.  She is aware that her anticipated success from ablation is 60% and may require multiple procedures.  We will therefore proceed with catheter ablation at the next available time.  2. OSA She is diagnosed with moderate sleep apnea per Dr Mayford Knife, though I do not have the report to review. Though the study was ordered as a split study, she does not appear to have had CPAP titrated during the study.  She is now reluctant to return for CPAP initiation until she speaks with Dr Shirlee Latch.  I think her concerns are reasonable.  I have spoken with Freida Busman who is happy to call her once he has the results of  her sleep study available (not presently in epic).  3. Obesity Weight loss is advised I have reviewed the patients BMI and decreased success rates with ablation at length today.  Weight loss is strongly advised.  Per Guijian et al (PACE 2013; 36: 454-098), patients with BMI 25-29.9 (obese) have a 27% increase in AF recurrence post ablation.  Patients with BMI >30 have a 31% increase in AF recurrence post ablation when compared to those with BMI <25.

## 2013-11-26 NOTE — CV Procedure (Signed)
Procedure: TEE  Indication: Atrial fibrillation, pre-ablation.   Sedation: Versed 6 mg IV, Fentanyl 50 mcg IV  Findings: The patient was in atrial fibrillation.  Please see full report in echo section of chart.  Normal LV size with EF approximately 30%.  Diffuse hypokinesis.  Mild MR.  Normal RV size with mildly decreased systolic function. No LA appendage thrombus.    May proceed with atrial fibrillation ablation.   [name] 11/26/2013 9:46 AM

## 2013-11-26 NOTE — Progress Notes (Signed)
Echocardiogram Echocardiogram Transesophageal has been performed.  Dorothey Baseman 11/26/2013, 11:02 AM

## 2013-11-26 NOTE — Anesthesia Preprocedure Evaluation (Addendum)
Anesthesia Evaluation  Patient identified by MRN, date of birth, ID band Patient awake    Reviewed: Allergy & Precautions, H&P , NPO status , Patient's Chart, lab work & pertinent test results, reviewed documented beta blocker date and time   Airway Mallampati: III TM Distance: >3 FB Neck ROM: full    Dental  (+) Dental Advisory Given and Teeth Intact   Pulmonary neg pulmonary ROS,  breath sounds clear to auscultation  Pulmonary exam normal       Cardiovascular +CHF + dysrhythmias Atrial Fibrillation Rhythm:regular  Echo 10/14:  Study Conclusions - Left ventricle: The cavity size was mildly dilated. Wall thickness was normal. The estimated ejection fraction was 25%. Diffuse hypokinesis. - Aortic valve: There was no stenosis. Trivial   regurgitation. - Aorta: Normal caliber aorta with mild descending thoracic aorta plaque. - Mitral valve: Trivial regurgitation. - Left atrium: The atrium was moderately dilated. No   evidence of thrombus in the atrial cavity or appendage. - Right ventricle: The cavity size was normal. Systolic   function was normal. - Right atrium: No evidence of thrombus in the atrial cavity   or appendage. No evidence of thrombus in the atrial cavity or appendage. - Atrial septum: No defect or patent foramen ovale was   identified. Echo contrast study showed no right-to-left   atrial level shunt, at baseline or with provocation.    Neuro/Psych negative neurological ROS  negative psych ROS   GI/Hepatic Neg liver ROS, GERD-  Medicated,  Endo/Other  Morbid obesity  Renal/GU negative Renal ROS  negative genitourinary   Musculoskeletal  (+) Arthritis -, Osteoarthritis,    Abdominal   Peds  Hematology negative hematology ROS (+)   Anesthesia Other Findings See surgeon's H&P   Reproductive/Obstetrics negative OB ROS                        Anesthesia Physical Anesthesia  Plan  ASA: III  Anesthesia Plan: General   Post-op Pain Management:    Induction: Intravenous  Airway Management Planned: LMA  Additional Equipment:   Intra-op Plan:   Post-operative Plan: Extubation in OR  Informed Consent: I have reviewed the patients History and Physical, chart, labs and discussed the procedure including the risks, benefits and alternatives for the proposed anesthesia with the patient or authorized representative who has indicated his/her understanding and acceptance.   Dental advisory given  Plan Discussed with: CRNA, Anesthesiologist and Surgeon  Anesthesia Plan Comments:         Anesthesia Quick Evaluation

## 2013-11-26 NOTE — Preoperative (Addendum)
Beta Blockers   Coreg 12.5 mg PO in pm of 11/25/13. None given intraoperatively on 11/26/13 due to hypotension and bradycardia.

## 2013-11-26 NOTE — Interval H&P Note (Signed)
History and Physical Interval Note:  11/26/2013 9:29 AM  Kelli Webster  has presented today for surgery, with the diagnosis of A FIB  The various methods of treatment have been discussed with the patient and family. After consideration of risks, benefits and other options for treatment, the patient has consented to  Procedure(s): TRANSESOPHAGEAL ECHOCARDIOGRAM (TEE) (N/A) as a surgical intervention .  The patient's history has been reviewed, patient examined, no change in status, stable for surgery.  I have reviewed the patient's chart and labs.  Questions were answered to the patient's satisfaction.     Kelli Webster

## 2013-11-26 NOTE — Progress Notes (Signed)
Admitted from the cath lab awake and alert, instructed to avoid bending right knee, to call for any bleeding and bedrest emphasized.

## 2013-11-27 ENCOUNTER — Encounter (HOSPITAL_COMMUNITY): Payer: Medicare Other

## 2013-11-27 ENCOUNTER — Encounter (HOSPITAL_COMMUNITY): Payer: Self-pay | Admitting: Cardiology

## 2013-11-27 DIAGNOSIS — I4891 Unspecified atrial fibrillation: Secondary | ICD-10-CM

## 2013-11-27 LAB — BASIC METABOLIC PANEL
BUN: 23 mg/dL (ref 6–23)
CO2: 25 mEq/L (ref 19–32)
Calcium: 8.3 mg/dL — ABNORMAL LOW (ref 8.4–10.5)
Chloride: 103 mEq/L (ref 96–112)
Creatinine, Ser: 1.44 mg/dL — ABNORMAL HIGH (ref 0.50–1.10)
GFR calc Af Amer: 42 mL/min — ABNORMAL LOW (ref >90)
GFR calc non Af Amer: 37 mL/min — ABNORMAL LOW (ref >90)
Glucose, Bld: 106 mg/dL — ABNORMAL HIGH (ref 70–99)
Potassium: 4.2 mEq/L (ref 3.5–5.1)
Sodium: 136 mEq/L (ref 135–145)

## 2013-11-27 NOTE — Progress Notes (Signed)
Discharge instructions given and reviewed with patient. Patient discharged home with family. 

## 2013-11-27 NOTE — Progress Notes (Signed)
Utilization Review Completed.Kelli Webster T12/09/2013

## 2014-01-03 ENCOUNTER — Ambulatory Visit (HOSPITAL_COMMUNITY)
Admission: RE | Admit: 2014-01-03 | Discharge: 2014-01-03 | Disposition: A | Discharge status: Home / Self Care | Payer: Medicare Other | Source: Ambulatory Visit | Attending: Internal Medicine | Admitting: Internal Medicine

## 2014-01-03 VITALS — BP 122/76 | HR 71 | Wt 245.5 lb

## 2014-01-03 DIAGNOSIS — I495 Sick sinus syndrome: Secondary | ICD-10-CM | POA: Insufficient documentation

## 2014-01-03 DIAGNOSIS — K219 Gastro-esophageal reflux disease without esophagitis: Secondary | ICD-10-CM | POA: Insufficient documentation

## 2014-01-03 DIAGNOSIS — I428 Other cardiomyopathies: Secondary | ICD-10-CM | POA: Insufficient documentation

## 2014-01-03 DIAGNOSIS — I509 Heart failure, unspecified: Secondary | ICD-10-CM

## 2014-01-03 DIAGNOSIS — E669 Obesity, unspecified: Secondary | ICD-10-CM | POA: Insufficient documentation

## 2014-01-03 DIAGNOSIS — G4733 Obstructive sleep apnea (adult) (pediatric): Secondary | ICD-10-CM

## 2014-01-03 DIAGNOSIS — I5022 Chronic systolic (congestive) heart failure: Secondary | ICD-10-CM | POA: Insufficient documentation

## 2014-01-03 DIAGNOSIS — Z79899 Other long term (current) drug therapy: Secondary | ICD-10-CM | POA: Insufficient documentation

## 2014-01-03 DIAGNOSIS — I4891 Unspecified atrial fibrillation: Secondary | ICD-10-CM | POA: Insufficient documentation

## 2014-01-03 LAB — BASIC METABOLIC PANEL
BUN: 35 mg/dL — ABNORMAL HIGH (ref 6–23)
CO2: 24 mEq/L (ref 19–32)
Calcium: 9.4 mg/dL (ref 8.4–10.5)
Chloride: 99 mEq/L (ref 96–112)
Creatinine, Ser: 1.48 mg/dL — ABNORMAL HIGH (ref 0.50–1.10)
GFR calc Af Amer: 41 mL/min — ABNORMAL LOW (ref >90)
GFR calc non Af Amer: 35 mL/min — ABNORMAL LOW (ref >90)
Glucose, Bld: 167 mg/dL — ABNORMAL HIGH (ref 70–99)
Potassium: 4.8 mEq/L (ref 3.7–5.3)
Sodium: 138 mEq/L (ref 137–147)

## 2014-01-03 LAB — DIGOXIN LEVEL: Digoxin Level: 0.7 ng/mL — ABNORMAL LOW (ref 0.8–2.0)

## 2014-01-03 LAB — HEPATIC FUNCTION PANEL
ALT: 12 U/L (ref 0–35)
AST: 15 U/L (ref 0–37)
Albumin: 3.9 g/dL (ref 3.5–5.2)
Alkaline Phosphatase: 63 U/L (ref 39–117)
Bilirubin, Direct: <0.2 mg/dL (ref 0.0–0.3)
Total Bilirubin: 0.5 mg/dL (ref 0.3–1.2)
Total Protein: 7.7 g/dL (ref 6.0–8.3)

## 2014-01-03 LAB — TSH: TSH: 2.664 u[IU]/mL (ref 0.350–4.500)

## 2014-01-03 MED ORDER — LOSARTAN POTASSIUM 25 MG PO TABS: 25.0000 mg | ORAL_TABLET | Freq: Two times a day (BID) | ORAL | Status: DC

## 2014-01-03 NOTE — Progress Notes (Signed)
Patient ID: Kelli Webster, female   DOB: 04/05/1946, 68 y.o.   MRN: [id number] PCP: Dr. Collins ScotlandSpear  68 yo with presents for follow up. Recent hospitalization with atrial fibrillation/RVR and acute systolic CHF.  She was admitted in 09/2013 with severe dyspnea for several days and was found to have atrial fibrillation with RVR and volume overload.  EF 20-25% on echo, diffuse hypokinesis.   She was cardioverted twice but both times went back into atrial fibrillation, the 2nd time on amiodarone.  She had an atrial fibrillation ablation in 12/14 that was successful.  TEE in 12/14 showed EF 30%.   Patient remains in NSR today.  She has been doing well.  She is not taking Lasix.  She is not short of breath walking on flat ground.  No chest pain.  No orthopnea or PND.  Weight is stable.   ECG: NSR, inferior and anterolateral T wave inversions  Labs (10/14): K 4.5, creatinine 1.1, HIV negative, SPEP/UPEP negative, LDL 62, HDL 45 Labs (10/18/13): K 4.2, creatinine 1.5, digoxin 1.0 Labs (12/14): K 4.2, creatinine 1.4   PMH:  1. Obesity 2. GERD 3. OA 4. EGD negative in 10/14 5. ACEI cough 6. Atrial fibrillation: First diagnosed in 10/14.  DCCV to NSR in 09/2013 but atrial fibrillation recurred. Cardioversion 10/18/2013 successful on amiodarone but back in atrial fibrillation in 11/14.  Atrial fibrillation ablation ([facility]) 12/14.  7. Cardiomyopathy: Echo (10/14) with EF 20-25%, diffuse HK.  LHC (10/14) showed no CAD.  TSH normal, HIV negative, SPEP/UPEP negative.  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  TEE (12/14) with EF 30%, diffuse hypokinesis, mild MR, mildly decreased RV systolic function.  8. Sinus bradycardia 9. OSA: Mild to moderate  SH: Married, lives in [place], nonsmoker.   FH: No history of cardiomyopathy or premature CAD.   ROS: All systems reviewed and negative except as per HPI.   Current Outpatient Prescriptions  Medication Sig Dispense Refill  . amiodarone (PACERONE) 200 MG tablet Take  1 tablet (200 mg total) by mouth daily.  30 tablet  6  . apixaban (ELIQUIS) 5 MG TABS tablet Take 1 tablet (5 mg total) by mouth 2 (two) times daily.  180 tablet  6  . carvedilol (COREG) 12.5 MG tablet Take 1 tablet (12.5 mg total) by mouth 2 (two) times daily with a meal.  180 tablet  6  . digoxin (LANOXIN) 0.125 MG tablet Take 1 tablet (0.125 mg total) by mouth every other day.  90 tablet  6  . famotidine (PEPCID) 20 MG tablet Take 1 tablet (20 mg total) by mouth 2 (two) times daily.  180 tablet  6  . losartan (COZAAR) 25 MG tablet Take 1 tablet (25 mg total) by mouth 2 (two) times daily.  60 tablet  6  . spironolactone (ALDACTONE) 25 MG tablet Take 1 tablet (25 mg total) by mouth daily.  90 tablet  6   No current facility-administered medications for this encounter.    Filed Vitals:   01/03/14 0951  BP: 122/76  Pulse: 71  Weight: 245 lb 8 oz (111.358 kg)  SpO2: 98%   General: NAD, obese Neck: No JVD, no thyromegaly or thyroid nodule.  Lungs: Clear to auscultation bilaterally with normal respiratory effort. CV: Nondisplaced PMI.  Heart regular S1/S2, no S3/S4, no murmur.  No peripheral edema.  No carotid bruit.  Normal pedal pulses.  Abdomen: Soft, nontender, no hepatosplenomegaly, no distention.  Skin: Intact without lesions or rashes.  Neurologic: Alert and oriented x 3.  Psych:  Normal affect. Extremities: No clubbing or cyanosis.   Assessment/Plan: 1. Atrial fibrillation: Patient is in NSR after atrial fibrillation ablation.  She is on amiodarone to maintain NSR.   - Continue apixaban - Continue amiodarone.  Will need LFTs, TSH today.  She needs at least yearly eye exam.  2. Chronic systolic CHF: Nonischemic CMP, possibly tachycardia-mediated. Last EF 30% in atrial fibrillation on TEE prior to ablation.   - Euvolemic, ok to stay off Lasix.  - Continue current Coreg, digoxin, and spironolactone. - Increase losartan to 25 mg bid.  - Check digoxin level. - BMET in 2 wks.  -  Echo in 3/15 to assess for improvement in EF.  If not, may need ICD.  3. OSA: Needs CPAP titration.  Will arrange.  4. Will set her up for cardiac rehab.   [name] 01/03/2014 10:56 AM

## 2014-01-03 NOTE — Patient Instructions (Signed)
Increase Losartan to 25 mg Twice daily   Labs today and in 2 weeks at [facility] have been referred to Cardiac Rehab  Your physician has requested that you have an echocardiogram. Echocardiography is a painless test that uses sound waves to create images of your heart. It provides your doctor with information about the size and shape of your heart and how well your heart's chambers and valves are working. This procedure takes approximately one hour. There are no restrictions for this procedure.  DUE IN MARCH, WE WILL CALL YOU TO SCHEDULE  We will contact you in 2 months to schedule your next appointment.  [facility] will call you to schedule the second part of your sleep study

## 2014-01-04 NOTE — Addendum Note (Signed)
Encounter addended by: Benisha Hadaway, CCT on: 01/04/2014 12:18 PM<BR>     Documentation filed: Charges VN

## 2014-01-10 ENCOUNTER — Telehealth: Payer: Self-pay | Admitting: Cardiology

## 2014-01-10 NOTE — Telephone Encounter (Signed)
Pt called to report pink tinged urine, while on Eliquis for atrial fibrillation. This just started today 01/10/14. She denies frank hematuria. No other sings of abnormal bleeding. She denies pelvic pain, dysuria, low back or flank pain. No fever, chills, n/v. No dizziness, syncope or near syncope.   Pt asked if she should take her second dose of Eliquis. Since she has not had any frank hematuria, I told her to resume Eliquis as directed. I instructed her to call back/ go to ER if condition worsens. I recommended that she also f/u with her PCP to rule out UTI and to look for other possible causes if condition persists. She voiced understanding.   Boyce Medici, PA-C

## 2014-01-17 ENCOUNTER — Other Ambulatory Visit (INDEPENDENT_AMBULATORY_CARE_PROVIDER_SITE_OTHER): Payer: Medicare Other

## 2014-01-17 DIAGNOSIS — I509 Heart failure, unspecified: Secondary | ICD-10-CM

## 2014-01-17 DIAGNOSIS — I5022 Chronic systolic (congestive) heart failure: Secondary | ICD-10-CM

## 2014-01-17 LAB — BASIC METABOLIC PANEL
BUN: 23 mg/dL (ref 6–23)
CO2: 27 mEq/L (ref 19–32)
Calcium: 9.1 mg/dL (ref 8.4–10.5)
Chloride: 104 mEq/L (ref 96–112)
Creatinine, Ser: 1.5 mg/dL — ABNORMAL HIGH (ref 0.4–1.2)
GFR: 36.24 mL/min — ABNORMAL LOW (ref >60.00)
Glucose, Bld: 119 mg/dL — ABNORMAL HIGH (ref 70–99)
Potassium: 4.2 mEq/L (ref 3.5–5.1)
Sodium: 138 mEq/L (ref 135–145)

## 2014-02-20 ENCOUNTER — Encounter (HOSPITAL_COMMUNITY)
Admission: RE | Admit: 2014-02-20 | Discharge: 2014-02-20 | Disposition: A | Discharge status: Home / Self Care | Payer: Medicare Other | Source: Ambulatory Visit | Attending: Cardiology | Admitting: Cardiology

## 2014-02-20 DIAGNOSIS — I509 Heart failure, unspecified: Secondary | ICD-10-CM | POA: Insufficient documentation

## 2014-02-20 DIAGNOSIS — Z5189 Encounter for other specified aftercare: Secondary | ICD-10-CM | POA: Insufficient documentation

## 2014-02-20 DIAGNOSIS — I4891 Unspecified atrial fibrillation: Secondary | ICD-10-CM | POA: Insufficient documentation

## 2014-02-20 DIAGNOSIS — I5021 Acute systolic (congestive) heart failure: Secondary | ICD-10-CM | POA: Insufficient documentation

## 2014-02-20 NOTE — Progress Notes (Signed)
Cardiac Rehab Medication Review by a Pharmacist  Does the patient  feel that his/her medications are working for him/her?  yes  Has the patient been experiencing any side effects to the medications prescribed?  no  Does the patient measure his/her own blood pressure or blood glucose at home?  no   Does the patient have any problems obtaining medications due to transportation or finances?   no  Understanding of regimen: good Understanding of indications: good Potential of compliance: good  Pharmacist comments: Patient had a good understanding of her medications.   Christiane Ha A. Lenon Ahmadi, PharmD Clinical Pharmacist - Resident Pager: [phone] Pharmacy: [phone] 02/20/2014 8:14 AM

## 2014-02-24 ENCOUNTER — Other Ambulatory Visit: Payer: Self-pay | Admitting: *Deleted

## 2014-02-24 ENCOUNTER — Encounter (HOSPITAL_COMMUNITY): Payer: Self-pay

## 2014-02-24 ENCOUNTER — Encounter (HOSPITAL_COMMUNITY)
Admission: RE | Admit: 2014-02-24 | Discharge: 2014-02-24 | Disposition: A | Discharge status: Home / Self Care | Payer: Medicare Other | Source: Ambulatory Visit | Attending: Cardiology | Admitting: Cardiology

## 2014-02-24 ENCOUNTER — Other Ambulatory Visit (HOSPITAL_COMMUNITY): Payer: Self-pay | Admitting: *Deleted

## 2014-02-24 MED ORDER — LOSARTAN POTASSIUM 50 MG PO TABS: 25.0000 mg | ORAL_TABLET | Freq: Two times a day (BID) | ORAL | Status: DC

## 2014-02-24 MED ORDER — APIXABAN 5 MG PO TABS: 5.0000 mg | ORAL_TABLET | Freq: Two times a day (BID) | ORAL | Status: DC

## 2014-02-24 NOTE — Progress Notes (Signed)
Pt started cardiac rehab today.  Pt tolerated light exercise without difficulty.  VSS,telemetry-sinus rhythm, non specific ST changes, PVC.  PHQ-0. Pt has no barriers to rehab participation.  Pt has effective coping skills with supportive family.  Pt did not turn in self reflected quality of life questionnaire.  Pt oriented to exercise equipment and routine.   Pt unsure of losartan dose. Pt will bring pill bottle or medication list to next class. Understanding verbalized.

## 2014-02-24 NOTE — Telephone Encounter (Signed)
Formulary change approved by Leonarda Salon. D. for losartan. Patient notified by phone.

## 2014-02-25 ENCOUNTER — Telehealth: Payer: Self-pay

## 2014-02-25 ENCOUNTER — Telehealth (HOSPITAL_COMMUNITY): Payer: Self-pay | Admitting: *Deleted

## 2014-02-25 NOTE — Telephone Encounter (Signed)
Patient called wanted to know why see could not get a 90 day supply of her eliquis from [facility]. I called [employer] to see why, I called  Her insurance company and they stated that she could only get a 30 day from a retail store and to get a 90 day supply she would have to register at [facility] to get 90 days. I told her that I would leave a 30 days supply of eliquis up front for her until she could get register with [facility]. Placed samples up front

## 2014-02-25 NOTE — Telephone Encounter (Signed)
Received form from [facility], pt's Eliquis needed PA, called [facility] PA at [id number] (pt's ID [id number]) med was approved through 02/26/15, called [facility] they are aware and states med goes through however it will cost $210, per pharmacist this is probably due to a deductible

## 2014-02-26 ENCOUNTER — Encounter (HOSPITAL_COMMUNITY)
Admission: RE | Admit: 2014-02-26 | Discharge: 2014-02-26 | Disposition: A | Discharge status: Home / Self Care | Payer: Medicare Other | Source: Ambulatory Visit | Attending: Cardiology | Admitting: Cardiology

## 2014-03-03 ENCOUNTER — Encounter (HOSPITAL_COMMUNITY)
Admission: RE | Admit: 2014-03-03 | Discharge: 2014-03-03 | Disposition: A | Discharge status: Home / Self Care | Payer: Medicare Other | Source: Ambulatory Visit | Attending: Cardiology | Admitting: Cardiology

## 2014-03-05 ENCOUNTER — Encounter (HOSPITAL_COMMUNITY)
Admission: RE | Admit: 2014-03-05 | Discharge: 2014-03-05 | Disposition: A | Discharge status: Home / Self Care | Payer: Medicare Other | Source: Ambulatory Visit | Attending: Cardiology | Admitting: Cardiology

## 2014-03-10 ENCOUNTER — Encounter: Payer: Medicare Other | Admitting: Internal Medicine

## 2014-03-10 ENCOUNTER — Encounter (HOSPITAL_COMMUNITY)
Admission: RE | Admit: 2014-03-10 | Discharge: 2014-03-10 | Disposition: A | Discharge status: Home / Self Care | Payer: Medicare Other | Source: Ambulatory Visit | Attending: Cardiology | Admitting: Cardiology

## 2014-03-10 NOTE — Progress Notes (Signed)
Reviewed home exercise with pt today.  Pt plans to walk and use recumbent bike at home for exercise.  Reviewed THR, pulse, RPE, sign and symptoms, and when to call 911 or MD.  Pt voiced understanding. Brea Coleson, MA, ACSM RCEP  

## 2014-03-12 ENCOUNTER — Encounter (HOSPITAL_COMMUNITY): Payer: Medicare Other

## 2014-03-12 ENCOUNTER — Other Ambulatory Visit (HOSPITAL_COMMUNITY): Payer: Self-pay

## 2014-03-12 MED ORDER — APIXABAN 5 MG PO TABS
5.0000 mg | ORAL_TABLET | Freq: Two times a day (BID) | ORAL | Status: DC
Start: 2014-03-12 — End: 2015-02-12

## 2014-03-14 ENCOUNTER — Encounter (HOSPITAL_COMMUNITY)
Admission: RE | Admit: 2014-03-14 | Discharge: 2014-03-14 | Disposition: A | Discharge status: Home / Self Care | Payer: Medicare Other | Source: Ambulatory Visit | Attending: Cardiology | Admitting: Cardiology

## 2014-03-17 ENCOUNTER — Telehealth: Payer: Self-pay | Admitting: *Deleted

## 2014-03-17 ENCOUNTER — Encounter (HOSPITAL_COMMUNITY)
Admission: RE | Admit: 2014-03-17 | Discharge: 2014-03-17 | Disposition: A | Discharge status: Home / Self Care | Payer: Medicare Other | Source: Ambulatory Visit | Attending: Cardiology | Admitting: Cardiology

## 2014-03-17 NOTE — Telephone Encounter (Signed)
PA for eliquis to [facility] 

## 2014-03-17 NOTE — Progress Notes (Signed)
Kelli Webster 69 y.o. female Nutrition Note Spoke with pt.  Nutrition Survey reviewed with pt. Pt is following Step 2 of the Therapeutic Lifestyle Changes diet. Pt wants to lose wt. Pt has been trying to lose wt by "following my own diet plan." Pt reports she "can't lose wt on 1300-1800 kcal/d." Per discussion, pt activity level is limited due to knee pain. Wt loss tips briefly reviewed. Pt expressed understanding of the information reviewed. Pt aware of nutrition education classes offered.  Nutrition Diagnosis   Food-and nutrition-related knowledge deficit related to lack of exposure to information as related to diagnosis of: ? CVD    Obesity related to excessive energy intake as evidenced by a BMI of 44.8  Nutrition Intervention   Benefits of adopting Therapeutic Lifestyle Changes discussed when Medficts reviewed.   Pt to attend the Portion Distortion class   Pt to attend the  ? Nutrition I class                     ? Nutrition II class   Continue client-centered nutrition education by RD, as part of interdisciplinary care.  Goal(s)   Pt to identify food quantities necessary to achieve: ? wt loss to a goal wt of 224-242 lb (102.1-110.3 kg) at graduation from cardiac rehab.   Monitor and Evaluate progress toward nutrition goal with team. Nutrition Risk:  Low   Mickle Plumb, M.Ed, RD, LDN, CDE 03/17/2014 2:06 PM

## 2014-03-17 NOTE — Telephone Encounter (Signed)
PA to [facility] for eliquis approved PA # [id number]  Had an approval on file

## 2014-03-19 ENCOUNTER — Encounter (HOSPITAL_COMMUNITY): Payer: Medicare Other

## 2014-03-19 ENCOUNTER — Telehealth (HOSPITAL_COMMUNITY): Payer: Self-pay | Admitting: Family Medicine

## 2014-03-21 ENCOUNTER — Encounter (HOSPITAL_COMMUNITY)
Admission: RE | Admit: 2014-03-21 | Discharge: 2014-03-21 | Disposition: A | Discharge status: Home / Self Care | Payer: Medicare Other | Source: Ambulatory Visit | Attending: Cardiology | Admitting: Cardiology

## 2014-03-21 DIAGNOSIS — I509 Heart failure, unspecified: Secondary | ICD-10-CM | POA: Insufficient documentation

## 2014-03-24 ENCOUNTER — Encounter (HOSPITAL_COMMUNITY): Admission: RE | Admit: 2014-03-24 | Payer: Medicare Other | Source: Ambulatory Visit

## 2014-03-24 ENCOUNTER — Telehealth (HOSPITAL_COMMUNITY): Payer: Self-pay | Admitting: *Deleted

## 2014-03-26 ENCOUNTER — Encounter (HOSPITAL_COMMUNITY): Payer: Medicare Other

## 2014-03-28 ENCOUNTER — Encounter (HOSPITAL_COMMUNITY)
Admission: RE | Admit: 2014-03-28 | Discharge: 2014-03-28 | Disposition: A | Discharge status: Home / Self Care | Payer: Medicare Other | Source: Ambulatory Visit | Attending: Cardiology | Admitting: Cardiology

## 2014-03-31 ENCOUNTER — Encounter (HOSPITAL_COMMUNITY): Payer: Medicare Other

## 2014-04-02 ENCOUNTER — Encounter (HOSPITAL_COMMUNITY)
Admission: RE | Admit: 2014-04-02 | Discharge: 2014-04-02 | Disposition: A | Discharge status: Home / Self Care | Payer: Medicare Other | Source: Ambulatory Visit | Attending: Cardiology | Admitting: Cardiology

## 2014-04-04 ENCOUNTER — Encounter (HOSPITAL_COMMUNITY)
Admission: RE | Admit: 2014-04-04 | Discharge: 2014-04-04 | Disposition: A | Discharge status: Home / Self Care | Payer: Medicare Other | Source: Ambulatory Visit | Attending: Cardiology | Admitting: Cardiology

## 2014-04-07 ENCOUNTER — Encounter (HOSPITAL_COMMUNITY): Payer: Medicare Other

## 2014-04-09 ENCOUNTER — Encounter (HOSPITAL_COMMUNITY)
Admission: RE | Admit: 2014-04-09 | Discharge: 2014-04-09 | Disposition: A | Discharge status: Home / Self Care | Payer: Medicare Other | Source: Ambulatory Visit | Attending: Cardiology | Admitting: Cardiology

## 2014-04-11 ENCOUNTER — Encounter (HOSPITAL_COMMUNITY)
Admission: RE | Admit: 2014-04-11 | Discharge: 2014-04-11 | Disposition: A | Discharge status: Home / Self Care | Payer: Medicare Other | Source: Ambulatory Visit | Attending: Cardiology | Admitting: Cardiology

## 2014-04-11 ENCOUNTER — Other Ambulatory Visit: Payer: Self-pay

## 2014-04-11 MED ORDER — FAMOTIDINE 20 MG PO TABS: 20.0000 mg | ORAL_TABLET | Freq: Two times a day (BID) | ORAL | Status: DC

## 2014-04-14 ENCOUNTER — Encounter (HOSPITAL_COMMUNITY)
Admission: RE | Admit: 2014-04-14 | Discharge: 2014-04-14 | Disposition: A | Discharge status: Home / Self Care | Payer: Medicare Other | Source: Ambulatory Visit | Attending: Cardiology | Admitting: Cardiology

## 2014-04-15 ENCOUNTER — Ambulatory Visit (HOSPITAL_BASED_OUTPATIENT_CLINIC_OR_DEPARTMENT_OTHER)
Admission: RE | Admit: 2014-04-15 | Discharge: 2014-04-15 | Disposition: A | Discharge status: Home / Self Care | Payer: Medicare Other | Source: Ambulatory Visit | Attending: Internal Medicine | Admitting: Internal Medicine

## 2014-04-15 ENCOUNTER — Ambulatory Visit (HOSPITAL_COMMUNITY)
Admission: RE | Admit: 2014-04-15 | Discharge: 2014-04-15 | Disposition: A | Discharge status: Home / Self Care | Payer: Medicare Other | Source: Ambulatory Visit | Attending: Cardiology | Admitting: Cardiology

## 2014-04-15 VITALS — BP 126/66 | HR 60 | Wt 244.5 lb

## 2014-04-15 DIAGNOSIS — I509 Heart failure, unspecified: Secondary | ICD-10-CM

## 2014-04-15 DIAGNOSIS — I359 Nonrheumatic aortic valve disorder, unspecified: Secondary | ICD-10-CM

## 2014-04-15 DIAGNOSIS — I4891 Unspecified atrial fibrillation: Secondary | ICD-10-CM | POA: Insufficient documentation

## 2014-04-15 DIAGNOSIS — I5022 Chronic systolic (congestive) heart failure: Secondary | ICD-10-CM

## 2014-04-15 DIAGNOSIS — G4733 Obstructive sleep apnea (adult) (pediatric): Secondary | ICD-10-CM

## 2014-04-15 LAB — COMPREHENSIVE METABOLIC PANEL
ALT: 15 U/L (ref 0–35)
AST: 19 U/L (ref 0–37)
Albumin: 4.1 g/dL (ref 3.5–5.2)
Alkaline Phosphatase: 64 U/L (ref 39–117)
BUN: 26 mg/dL — ABNORMAL HIGH (ref 6–23)
CO2: 25 mEq/L (ref 19–32)
Calcium: 9.7 mg/dL (ref 8.4–10.5)
Chloride: 98 mEq/L (ref 96–112)
Creatinine, Ser: 1.6 mg/dL — ABNORMAL HIGH (ref 0.50–1.10)
GFR calc Af Amer: 37 mL/min — ABNORMAL LOW (ref >90)
GFR calc non Af Amer: 32 mL/min — ABNORMAL LOW (ref >90)
Glucose, Bld: 138 mg/dL — ABNORMAL HIGH (ref 70–99)
Potassium: 5.2 mEq/L (ref 3.7–5.3)
Sodium: 138 mEq/L (ref 137–147)
Total Bilirubin: 0.4 mg/dL (ref 0.3–1.2)
Total Protein: 7.8 g/dL (ref 6.0–8.3)

## 2014-04-15 LAB — DIGOXIN LEVEL: Digoxin Level: 1 ng/mL (ref 0.8–2.0)

## 2014-04-15 LAB — TSH: TSH: 2.12 u[IU]/mL (ref 0.350–4.500)

## 2014-04-15 NOTE — Patient Instructions (Signed)
Doing great.  Follow up in 3 months.  Do the following things EVERYDAY: 1) Weigh yourself in the morning before breakfast. Write it down and keep it in a log. 2) Take your medicines as prescribed 3) Eat low salt foods-Limit salt (sodium) to 2000 mg per day.  4) Stay as active as you can everyday 5) Limit all fluids for the day to less than 2 liters 6)

## 2014-04-15 NOTE — Progress Notes (Signed)
Echocardiogram 2D Echocardiogram has been performed.  Kelli Webster 04/15/2014, 1:13 PM

## 2014-04-16 ENCOUNTER — Encounter (HOSPITAL_COMMUNITY): Payer: Medicare Other

## 2014-04-16 NOTE — Progress Notes (Signed)
Patient ID: Kelli Webster, female   DOB: 03/21/1946, 68 y.o.   MRN: [id number] PCP: Dr. Collins ScotlandSpear  68 yo with presents for follow up. Recent hospitalization with atrial fibrillation/RVR and acute systolic CHF.  She was admitted in 09/2013 with severe dyspnea for several days and was found to have atrial fibrillation with RVR and volume overload.  EF 20-25% on echo, diffuse hypokinesis.   She was cardioverted twice but both times went back into atrial fibrillation, the 2nd time on amiodarone.  She had an atrial fibrillation ablation in 12/14 that was successful.  TEE in 12/14 showed EF 30%.   Patient remains in NSR today.  She has been doing well.  She is not taking Lasix.  She is not short of breath walking on flat ground.  No chest pain.  No orthopnea or PND.  Weight is down 1 lb.  I reviewed today's echocardiogram.  EF is improved to about 45%.    Labs (10/14): K 4.5, creatinine 1.1, HIV negative, SPEP/UPEP negative, LDL 62, HDL 45 Labs (10/18/13): K 4.2, creatinine 1.5, digoxin 1.0 Labs (12/14): K 4.2, creatinine 1.4 Labs (1/15): K 4.2, creatinine 1.5, digoxin 0.7, LFTs and TSH normal   PMH:  1. Obesity 2. GERD 3. OA 4. EGD negative in 10/14 5. ACEI cough 6. Atrial fibrillation: First diagnosed in 10/14.  DCCV to NSR in 09/2013 but atrial fibrillation recurred. Cardioversion 10/18/2013 successful on amiodarone but back in atrial fibrillation in 11/14.  Atrial fibrillation ablation ([facility]) 12/14.  7. Cardiomyopathy: Echo (10/14) with EF 20-25%, diffuse HK.  LHC (10/14) showed no CAD.  TSH normal, HIV negative, SPEP/UPEP negative.  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  TEE (12/14) with EF 30%, diffuse hypokinesis, mild MR, mildly decreased RV systolic function.  Echo (4/15) with EF 45%, diffuse hypokinesis, mildly dilated LV, normal RV size and systolic function.  8. Sinus bradycardia 9. OSA: Mild to moderate 10. CKD  SH: Married, [place], nonsmoker.   FH: No history of cardiomyopathy  or premature CAD.   ROS: All systems reviewed and negative except as per HPI.   Current Outpatient Prescriptions  Medication Sig Dispense Refill  . amiodarone (PACERONE) 200 MG tablet Take 1 tablet (200 mg total) by mouth daily.  30 tablet  6  . apixaban (ELIQUIS) 5 MG TABS tablet Take 1 tablet (5 mg total) by mouth 2 (two) times daily.  180 tablet  3  . carvedilol (COREG) 12.5 MG tablet Take 1 tablet (12.5 mg total) by mouth 2 (two) times daily with a meal.  180 tablet  6  . digoxin (LANOXIN) 0.125 MG tablet Take 1 tablet (0.125 mg total) by mouth every other day.  90 tablet  6  . famotidine (PEPCID) 20 MG tablet Take 1 tablet (20 mg total) by mouth 2 (two) times daily.  180 tablet  6  . losartan (COZAAR) 50 MG tablet Take 0.5 tablets (25 mg total) by mouth 2 (two) times daily.  30 tablet  6  . spironolactone (ALDACTONE) 25 MG tablet Take 1 tablet (25 mg total) by mouth daily.  90 tablet  6   No current facility-administered medications for this encounter.    Filed Vitals:   04/15/14 1420  BP: 126/66  Pulse: 60  Weight: 244 lb 8 oz (110.904 kg)  SpO2: 100%   General: NAD, obese Neck: No JVD, no thyromegaly or thyroid nodule.  Lungs: Clear to auscultation bilaterally with normal respiratory effort. CV: Nondisplaced PMI.  Heart regular S1/S2, no S3/S4, no  murmur.  No peripheral edema.  No carotid bruit.  Normal pedal pulses.  Abdomen: Soft, nontender, no hepatosplenomegaly, no distention.  Skin: Intact without lesions or rashes.  Neurologic: Alert and oriented x 3.  Psych: Normal affect. Extremities: No clubbing or cyanosis.   Assessment/Plan: 1. Atrial fibrillation: Patient is in NSR after atrial fibrillation ablation.  She is on amiodarone to maintain NSR.   - Continue apixaban - Continue amiodarone.  Will need LFTs, TSH today.  She needs at least yearly eye exam.  2. Chronic systolic CHF: Nonischemic CMP, possibly tachycardia-mediated. EF up to 45% on echo today.  Euvolemic,  ok to stay off Lasix.  - With improvement in EF, no indication for ICD. - Continue current Coreg, digoxin, losartan, and spironolactone. - Check digoxin level and BMET today. 3. OSA: Needs CPAP titration.   4. Continue cardiac rehab.   Followup in 3 months.   Kelli Webster 04/16/2014 12:29 AM

## 2014-04-18 ENCOUNTER — Encounter (HOSPITAL_COMMUNITY)
Admission: RE | Admit: 2014-04-18 | Discharge: 2014-04-18 | Disposition: A | Discharge status: Home / Self Care | Payer: Medicare Other | Source: Ambulatory Visit | Attending: Cardiology | Admitting: Cardiology

## 2014-04-18 DIAGNOSIS — I509 Heart failure, unspecified: Secondary | ICD-10-CM | POA: Insufficient documentation

## 2014-04-21 ENCOUNTER — Encounter (HOSPITAL_COMMUNITY)
Admission: RE | Admit: 2014-04-21 | Discharge: 2014-04-21 | Disposition: A | Discharge status: Home / Self Care | Payer: Medicare Other | Source: Ambulatory Visit | Attending: Cardiology | Admitting: Cardiology

## 2014-04-23 ENCOUNTER — Ambulatory Visit (INDEPENDENT_AMBULATORY_CARE_PROVIDER_SITE_OTHER): Payer: Medicare Other | Admitting: Internal Medicine

## 2014-04-23 ENCOUNTER — Encounter (HOSPITAL_COMMUNITY): Payer: Medicare Other

## 2014-04-23 ENCOUNTER — Encounter: Payer: Self-pay | Admitting: Internal Medicine

## 2014-04-23 VITALS — BP 138/84 | HR 63 | Ht 63.5 in | Wt 244.0 lb

## 2014-04-23 DIAGNOSIS — I4891 Unspecified atrial fibrillation: Secondary | ICD-10-CM

## 2014-04-23 MED ORDER — AMIODARONE HCL 200 MG PO TABS
100.0000 mg | ORAL_TABLET | Freq: Every day | ORAL | Status: DC
Start: 2014-04-23 — End: 2015-02-12

## 2014-04-23 NOTE — Progress Notes (Signed)
PCP: Herb Grays, MD Primary Cardiologist:  Dr Shirlee Latch  Kelli Webster is a 68 y.o. female who presents today for routine electrophysiology followup.  Since her afib ablation, the patient reports doing very well.  She did not have procedure related issues.  She is maintaining sinus rhythm post ablation.  Today, she denies symptoms of palpitations, chest pain, shortness of breath,  lower extremity edema, dizziness, presyncope, or syncope.  The patient is otherwise without complaint today.   Past Medical History  Diagnosis Date  . GERD (gastroesophageal reflux disease)     a. 09/2013 endoscopy nl  . Morbid obesity   . Persistent atrial fibrillation     a. Dx 09/2013 b. s/p PVI by Dr Johney Frame 11-26-2013  . Osteoarthritis   . Chronic systolic heart failure     a. ECHO (09/2013) EF 20-25% diff HK, mild/mod MR, LA mod dil, RV mildly dil, RA mildly dil b. R/LHC (09/26/13) AO 133/88, LV 137/17, Lmain no dz, LAD 30% prox, LCX mild luminal irreg; RCA mild luminal irreg   Past Surgical History  Procedure Laterality Date  . Esophagogastroduodenoscopy N/A 09/25/2013    Procedure: ESOPHAGOGASTRODUODENOSCOPY (EGD);  Surgeon: Graylin Shiver, MD;  Location: [facility] ENDOSCOPY;  Service: Endoscopy;  Laterality: N/A;  . Arthroscopy       left knee    . Fx left wrist      open reduction & internal fixaction  . Tee without cardioversion N/A 10/01/2013    Procedure: TRANSESOPHAGEAL ECHOCARDIOGRAM (TEE);  Surgeon: Laurey Morale, MD;  Location: [facility] ENDOSCOPY;  Service: Cardiovascular;  Laterality: N/A;  . Cardioversion N/A 10/01/2013    Procedure: CARDIOVERSION;  Surgeon: Laurey Morale, MD;  Location: [facility] ENDOSCOPY;  Service: Cardiovascular;  Laterality: N/A;  . Cardioversion N/A 10/17/2013    Procedure: CARDIOVERSION;  Surgeon: Laurey Morale, MD;  Location: [facility] ENDOSCOPY;  Service: Cardiovascular;  Laterality: N/A;  . Ablation  11-26-2013    PVI by Dr Johney Frame  . Tee without cardioversion N/A 11/26/2013    Procedure:  TRANSESOPHAGEAL ECHOCARDIOGRAM (TEE);  Surgeon: Laurey Morale, MD;  Location: [facility] ENDOSCOPY;  Service: Cardiovascular;  Laterality: N/A;    Current Outpatient Prescriptions  Medication Sig Dispense Refill  . amiodarone (PACERONE) 200 MG tablet Take 0.5 tablets (100 mg total) by mouth daily.  30 tablet  6  . apixaban (ELIQUIS) 5 MG TABS tablet Take 1 tablet (5 mg total) by mouth 2 (two) times daily.  180 tablet  3  . carvedilol (COREG) 12.5 MG tablet Take 1 tablet (12.5 mg total) by mouth 2 (two) times daily with a meal.  180 tablet  6  . digoxin (LANOXIN) 0.125 MG tablet Take 1 tablet (0.125 mg total) by mouth every other day.  90 tablet  6  . famotidine (PEPCID) 20 MG tablet Take 1 tablet (20 mg total) by mouth 2 (two) times daily.  180 tablet  6  . losartan (COZAAR) 50 MG tablet Take 0.5 tablets (25 mg total) by mouth 2 (two) times daily.  30 tablet  6  . spironolactone (ALDACTONE) 25 MG tablet Take 1 tablet (25 mg total) by mouth daily.  90 tablet  6   No current facility-administered medications for this visit.    Physical Exam: Filed Vitals:   04/23/14 1115  BP: 138/84  Pulse: 63  Height: 5' 3.5" (1.613 m)  Weight: 244 lb (110.678 kg)    GEN- The patient is well appearing, alert and oriented x 3 today.   Head- normocephalic,  atraumatic Eyes-  Sclera clear, conjunctiva pink Ears- hearing intact Oropharynx- clear Lungs- Clear to ausculation bilaterally, normal work of breathing Heart- Regular rate and rhythm, no murmurs, rubs or gallops, PMI not laterally displaced GI- soft, NT, ND, + BS Extremities- no clubbing, cyanosis, or edema   ekg today reveals sinus rhythm 62 bpm, PR 206, nonspecific ST/T changes Echo is reviewed  Assessment and Plan:  1. afib Doing well s/p ablation Decrease amiodarone to 100mg  daily No other changes today Consider stopping amiodarone if maintaining sinus upon return in 3 months

## 2014-04-23 NOTE — Patient Instructions (Signed)
Your physician recommends that you schedule a follow-up appointment in: 3 months with Dr Allred  Your physician has recommended you make the following change in your medication:  1) Decrease Amiodarone to 100mg daily   

## 2014-04-25 ENCOUNTER — Encounter (HOSPITAL_COMMUNITY)
Admission: RE | Admit: 2014-04-25 | Discharge: 2014-04-25 | Disposition: A | Discharge status: Home / Self Care | Payer: Medicare Other | Source: Ambulatory Visit | Attending: Cardiology | Admitting: Cardiology

## 2014-04-28 ENCOUNTER — Encounter (HOSPITAL_COMMUNITY)
Admission: RE | Admit: 2014-04-28 | Discharge: 2014-04-28 | Disposition: A | Discharge status: Home / Self Care | Payer: Medicare Other | Source: Ambulatory Visit | Attending: Cardiology | Admitting: Cardiology

## 2014-04-30 ENCOUNTER — Encounter (HOSPITAL_COMMUNITY)
Admission: RE | Admit: 2014-04-30 | Discharge: 2014-04-30 | Disposition: A | Discharge status: Home / Self Care | Payer: Medicare Other | Source: Ambulatory Visit | Attending: Cardiology | Admitting: Cardiology

## 2014-05-05 ENCOUNTER — Encounter (HOSPITAL_COMMUNITY)
Admission: RE | Admit: 2014-05-05 | Discharge: 2014-05-05 | Disposition: A | Discharge status: Home / Self Care | Payer: Medicare Other | Source: Ambulatory Visit | Attending: Cardiology | Admitting: Cardiology

## 2014-05-07 ENCOUNTER — Encounter (HOSPITAL_COMMUNITY): Payer: Medicare Other

## 2014-05-09 ENCOUNTER — Encounter (HOSPITAL_COMMUNITY)
Admission: RE | Admit: 2014-05-09 | Discharge: 2014-05-09 | Disposition: A | Discharge status: Home / Self Care | Payer: Medicare Other | Source: Ambulatory Visit | Attending: Cardiology | Admitting: Cardiology

## 2014-05-12 ENCOUNTER — Encounter (HOSPITAL_COMMUNITY): Payer: Medicare Other

## 2014-05-14 ENCOUNTER — Encounter (HOSPITAL_COMMUNITY)
Admission: RE | Admit: 2014-05-14 | Discharge: 2014-05-14 | Disposition: A | Discharge status: Home / Self Care | Payer: Medicare Other | Source: Ambulatory Visit | Attending: Cardiology | Admitting: Cardiology

## 2014-05-16 ENCOUNTER — Encounter (HOSPITAL_COMMUNITY)
Admission: RE | Admit: 2014-05-16 | Discharge: 2014-05-16 | Disposition: A | Discharge status: Home / Self Care | Payer: Medicare Other | Source: Ambulatory Visit | Attending: Cardiology | Admitting: Cardiology

## 2014-05-19 ENCOUNTER — Encounter (HOSPITAL_COMMUNITY): Payer: Medicare Other

## 2014-05-21 ENCOUNTER — Encounter (HOSPITAL_COMMUNITY)
Admission: RE | Admit: 2014-05-21 | Discharge: 2014-05-21 | Disposition: A | Discharge status: Home / Self Care | Payer: Medicare Other | Source: Ambulatory Visit | Attending: Cardiology | Admitting: Cardiology

## 2014-05-21 DIAGNOSIS — I509 Heart failure, unspecified: Secondary | ICD-10-CM | POA: Insufficient documentation

## 2014-05-23 ENCOUNTER — Encounter (HOSPITAL_COMMUNITY)
Admission: RE | Admit: 2014-05-23 | Discharge: 2014-05-23 | Disposition: A | Discharge status: Home / Self Care | Payer: Medicare Other | Source: Ambulatory Visit | Attending: Cardiology | Admitting: Cardiology

## 2014-05-26 ENCOUNTER — Encounter (HOSPITAL_COMMUNITY): Payer: Medicare Other

## 2014-05-28 ENCOUNTER — Encounter (HOSPITAL_COMMUNITY)
Admission: RE | Admit: 2014-05-28 | Discharge: 2014-05-28 | Disposition: A | Discharge status: Home / Self Care | Payer: Medicare Other | Source: Ambulatory Visit | Attending: Cardiology | Admitting: Cardiology

## 2014-06-02 ENCOUNTER — Encounter (HOSPITAL_COMMUNITY)
Admission: RE | Admit: 2014-06-02 | Discharge: 2014-06-02 | Disposition: A | Discharge status: Home / Self Care | Payer: Medicare Other | Source: Ambulatory Visit | Attending: Cardiology | Admitting: Cardiology

## 2014-06-04 ENCOUNTER — Encounter (HOSPITAL_COMMUNITY): Payer: Medicare Other

## 2014-06-06 ENCOUNTER — Encounter (HOSPITAL_COMMUNITY)
Admission: RE | Admit: 2014-06-06 | Discharge: 2014-06-06 | Disposition: A | Discharge status: Home / Self Care | Payer: Medicare Other | Source: Ambulatory Visit | Attending: Cardiology | Admitting: Cardiology

## 2014-06-09 ENCOUNTER — Encounter (HOSPITAL_COMMUNITY): Payer: Medicare Other

## 2014-06-11 ENCOUNTER — Encounter (HOSPITAL_COMMUNITY)
Admission: RE | Admit: 2014-06-11 | Discharge: 2014-06-11 | Disposition: A | Discharge status: Home / Self Care | Payer: Medicare Other | Source: Ambulatory Visit | Attending: Cardiology | Admitting: Cardiology

## 2014-06-13 ENCOUNTER — Encounter (HOSPITAL_COMMUNITY)
Admission: RE | Admit: 2014-06-13 | Discharge: 2014-06-13 | Disposition: A | Discharge status: Home / Self Care | Payer: Medicare Other | Source: Ambulatory Visit | Attending: Cardiology | Admitting: Cardiology

## 2014-06-16 ENCOUNTER — Encounter (HOSPITAL_COMMUNITY)
Admission: RE | Admit: 2014-06-16 | Discharge: 2014-06-16 | Disposition: A | Discharge status: Home / Self Care | Payer: Medicare Other | Source: Ambulatory Visit | Attending: Cardiology | Admitting: Cardiology

## 2014-06-18 ENCOUNTER — Encounter (HOSPITAL_COMMUNITY)
Admission: RE | Admit: 2014-06-18 | Discharge: 2014-06-18 | Disposition: A | Discharge status: Home / Self Care | Payer: Medicare Other | Source: Ambulatory Visit | Attending: Cardiology | Admitting: Cardiology

## 2014-06-18 DIAGNOSIS — I509 Heart failure, unspecified: Secondary | ICD-10-CM | POA: Insufficient documentation

## 2014-06-23 ENCOUNTER — Encounter (HOSPITAL_COMMUNITY)
Admission: RE | Admit: 2014-06-23 | Discharge: 2014-06-23 | Disposition: A | Discharge status: Home / Self Care | Payer: Medicare Other | Source: Ambulatory Visit | Attending: Cardiology | Admitting: Cardiology

## 2014-06-23 DIAGNOSIS — I509 Heart failure, unspecified: Secondary | ICD-10-CM | POA: Diagnosis not present

## 2014-06-25 ENCOUNTER — Encounter (HOSPITAL_COMMUNITY): Payer: Self-pay | Admitting: Cardiac Rehabilitation

## 2014-06-25 ENCOUNTER — Encounter (HOSPITAL_COMMUNITY)
Admission: RE | Admit: 2014-06-25 | Discharge: 2014-06-25 | Disposition: A | Discharge status: Home / Self Care | Payer: Medicare Other | Source: Ambulatory Visit | Attending: Cardiology | Admitting: Cardiology

## 2014-06-25 DIAGNOSIS — I509 Heart failure, unspecified: Secondary | ICD-10-CM | POA: Diagnosis not present

## 2014-07-16 ENCOUNTER — Encounter (HOSPITAL_COMMUNITY): Payer: Self-pay | Admitting: Vascular Surgery

## 2014-07-30 ENCOUNTER — Ambulatory Visit: Payer: Medicare Other | Admitting: Internal Medicine

## 2014-09-15 ENCOUNTER — Encounter: Payer: Self-pay | Admitting: Internal Medicine

## 2014-09-15 ENCOUNTER — Ambulatory Visit (INDEPENDENT_AMBULATORY_CARE_PROVIDER_SITE_OTHER): Payer: Medicare Other | Admitting: Internal Medicine

## 2014-09-15 VITALS — BP 136/84 | HR 71 | Ht 63.5 in | Wt 246.4 lb

## 2014-09-15 DIAGNOSIS — I509 Heart failure, unspecified: Secondary | ICD-10-CM

## 2014-09-15 DIAGNOSIS — I5022 Chronic systolic (congestive) heart failure: Secondary | ICD-10-CM

## 2014-09-15 DIAGNOSIS — I4891 Unspecified atrial fibrillation: Secondary | ICD-10-CM

## 2014-09-15 DIAGNOSIS — I480 Paroxysmal atrial fibrillation: Secondary | ICD-10-CM

## 2014-09-15 NOTE — Progress Notes (Signed)
PCP: Herb Grays, MD Primary Cardiologist:  Dr Shirlee Latch  Kelli Webster is a 68 y.o. female who presents today for routine electrophysiology followup.  Since her last visit, the patient reports doing very well.   She is maintaining sinus rhythm post ablation.  Today, she denies symptoms of palpitations, chest pain, shortness of breath,  lower extremity edema, dizziness, presyncope, or syncope.  The patient is otherwise without complaint today.  She has tried to exercise but has not been successful in weight reduction.  Past Medical History  Diagnosis Date  . GERD (gastroesophageal reflux disease)     a. 09/2013 endoscopy nl  . Morbid obesity   . Persistent atrial fibrillation     a. Dx 09/2013 b. s/p PVI by Dr Johney Frame 11-26-2013  . Osteoarthritis   . Chronic systolic heart failure     a. ECHO (09/2013) EF 20-25% diff HK, mild/mod MR, LA mod dil, RV mildly dil, RA mildly dil b. R/LHC (09/26/13) AO 133/88, LV 137/17, Lmain no dz, LAD 30% prox, LCX mild luminal irreg; RCA mild luminal irreg   Past Surgical History  Procedure Laterality Date  . Esophagogastroduodenoscopy N/A 09/25/2013    Procedure: ESOPHAGOGASTRODUODENOSCOPY (EGD);  Surgeon: Graylin Shiver, MD;  Location: [facility] ENDOSCOPY;  Service: Endoscopy;  Laterality: N/A;  . Arthroscopy       left knee    . Fx left wrist      open reduction & internal fixaction  . Tee without cardioversion N/A 10/01/2013    Procedure: TRANSESOPHAGEAL ECHOCARDIOGRAM (TEE);  Surgeon: Laurey Morale, MD;  Location: [facility] ENDOSCOPY;  Service: Cardiovascular;  Laterality: N/A;  . Cardioversion N/A 10/01/2013    Procedure: CARDIOVERSION;  Surgeon: Laurey Morale, MD;  Location: [facility] ENDOSCOPY;  Service: Cardiovascular;  Laterality: N/A;  . Cardioversion N/A 10/17/2013    Procedure: CARDIOVERSION;  Surgeon: Laurey Morale, MD;  Location: [facility] ENDOSCOPY;  Service: Cardiovascular;  Laterality: N/A;  . Ablation  11-26-2013    PVI by Dr Johney Frame  . Tee without cardioversion N/A  11/26/2013    Procedure: TRANSESOPHAGEAL ECHOCARDIOGRAM (TEE);  Surgeon: Laurey Morale, MD;  Location: [facility] ENDOSCOPY;  Service: Cardiovascular;  Laterality: N/A;    Current Outpatient Prescriptions  Medication Sig Dispense Refill  . amiodarone (PACERONE) 200 MG tablet Take 0.5 tablets (100 mg total) by mouth daily.  30 tablet  6  . apixaban (ELIQUIS) 5 MG TABS tablet Take 1 tablet (5 mg total) by mouth 2 (two) times daily.  180 tablet  3  . carvedilol (COREG) 12.5 MG tablet Take 1 tablet (12.5 mg total) by mouth 2 (two) times daily with a meal.  180 tablet  6  . digoxin (LANOXIN) 0.125 MG tablet Take 1 tablet (0.125 mg total) by mouth every other day.  90 tablet  6  . famotidine (PEPCID) 20 MG tablet Take 1 tablet (20 mg total) by mouth 2 (two) times daily.  180 tablet  6  . losartan (COZAAR) 50 MG tablet Take 0.5 tablets (25 mg total) by mouth 2 (two) times daily.  30 tablet  6  . spironolactone (ALDACTONE) 25 MG tablet Take 1 tablet (25 mg total) by mouth daily.  90 tablet  6   No current facility-administered medications for this visit.   ROS- all systems are reviewed and negative except as per HPI above  Physical Exam: Filed Vitals:   09/15/14 1607  BP: 136/84  Pulse: 71  Height: 5' 3.5" (1.613 m)  Weight: 246 lb 6.4 oz (111.766  kg)    GEN- The patient is well appearing, alert and oriented x 3 today.   Head- normocephalic, atraumatic Eyes-  Sclera clear, conjunctiva pink Ears- hearing intact Oropharynx- clear Lungs- Clear to ausculation bilaterally, normal work of breathing Heart- Regular rate and rhythm, no murmurs, rubs or gallops, PMI not laterally displaced GI- soft, NT, ND, + BS Extremities- no clubbing, cyanosis, or edema  ekg today reveals sinus rhythm 71 bpm, PR 200 with PVCs, nonspecific ST/T changes Echo is reviewed  Assessment and Plan:  1. afib Doing well s/p ablation on amiodarone  daily She is reluctant to stop amiodarone today but may be more  willing to do so in 3 months No other changes today Consider stopping amiodarone if maintaining sinus upon return in 3 months The importance of lifestyle modification including weight reduction and regular exercise were stressed today  2. Obesity Body mass index is 42.96 kg/(m^2). Weight reduction advised  3. Nonischemic CHF/ chronic systolic dysfunction EF is improving with sinus rhythm  Return to see Rudi Coco NP in the afib clinic in 3 months I will see in 6 months

## 2014-09-15 NOTE — Patient Instructions (Signed)
Your physician recommends that you continue on your current medications as directed. Please refer to the Current Medication list given to you today. Your physician recommends that you schedule a follow-up appointment in 3 months with Rudi Coco, NP at the [facility] at [facility].  We will be in contact with you to schedule the appointment.  Your physician wants you to follow-up in: 6 months with Dr. Johney Frame.   You will receive a reminder letter in the mail two months in advance. If you don't receive a letter, please call our office to schedule the follow-up appointment.

## 2014-10-07 NOTE — Progress Notes (Signed)
(  late entry for 06/25/2014)  Pt graduated from cardiac rehab program today with completion of 33 exercise sessions in Phase II. Pt maintained good attendance and progressed nicely during her  participation in rehab as evidenced by increased MET level.   Medication list reconciled. Repeat  PHQ9 score-0  .  Pt has made significant lifestyle changes and should be commended for her success.pt exercise ability somewhat limited by orthopaedic pain.  Pt feels she has achieved her goals during cardiac rehab.   Pt plans to continue exercising on her own.

## 2014-10-08 ENCOUNTER — Other Ambulatory Visit: Payer: Self-pay | Admitting: Internal Medicine

## 2014-10-14 ENCOUNTER — Encounter (HOSPITAL_COMMUNITY): Payer: Medicare Other

## 2014-10-31 ENCOUNTER — Encounter (HOSPITAL_COMMUNITY): Payer: Medicare Other

## 2014-11-05 ENCOUNTER — Ambulatory Visit (HOSPITAL_COMMUNITY)
Admission: RE | Admit: 2014-11-05 | Discharge: 2014-11-05 | Disposition: A | Discharge status: Home / Self Care | Payer: Medicare Other | Source: Ambulatory Visit | Attending: Internal Medicine | Admitting: Internal Medicine

## 2014-11-05 DIAGNOSIS — I4891 Unspecified atrial fibrillation: Secondary | ICD-10-CM | POA: Diagnosis not present

## 2014-11-05 DIAGNOSIS — I429 Cardiomyopathy, unspecified: Secondary | ICD-10-CM | POA: Insufficient documentation

## 2014-11-05 DIAGNOSIS — Z7901 Long term (current) use of anticoagulants: Secondary | ICD-10-CM | POA: Insufficient documentation

## 2014-11-05 DIAGNOSIS — I5022 Chronic systolic (congestive) heart failure: Secondary | ICD-10-CM | POA: Insufficient documentation

## 2014-11-05 DIAGNOSIS — N189 Chronic kidney disease, unspecified: Secondary | ICD-10-CM | POA: Diagnosis not present

## 2014-11-05 DIAGNOSIS — G4733 Obstructive sleep apnea (adult) (pediatric): Secondary | ICD-10-CM | POA: Insufficient documentation

## 2014-11-05 DIAGNOSIS — Z79899 Other long term (current) drug therapy: Secondary | ICD-10-CM | POA: Insufficient documentation

## 2014-11-05 LAB — BASIC METABOLIC PANEL
Anion gap: 14 (ref 5–15)
BUN: 24 mg/dL — ABNORMAL HIGH (ref 6–23)
CO2: 24 mEq/L (ref 19–32)
Calcium: 9.3 mg/dL (ref 8.4–10.5)
Chloride: 102 mEq/L (ref 96–112)
Creatinine, Ser: 1.4 mg/dL — ABNORMAL HIGH (ref 0.50–1.10)
GFR calc Af Amer: 44 mL/min — ABNORMAL LOW (ref >90)
GFR calc non Af Amer: 38 mL/min — ABNORMAL LOW (ref >90)
Glucose, Bld: 130 mg/dL — ABNORMAL HIGH (ref 70–99)
Potassium: 5.1 mEq/L (ref 3.7–5.3)
Sodium: 140 mEq/L (ref 137–147)

## 2014-11-05 NOTE — Patient Instructions (Addendum)
Labs today STOP DIGOXIN  Your physician recommends that you schedule a follow-up appointment in: 3 months with echocardiogram-DrMcLean  Do the following things EVERYDAY: 1) Weigh yourself in the morning before breakfast. Write it down and keep it in a log. 2) Take your medicines as prescribed 3) Eat low salt foods-Limit salt (sodium) to 2000 mg per day.  4) Stay as active as you can everyday 5) Limit all fluids for the day to less than 2 liters 6)

## 2014-11-05 NOTE — Progress Notes (Signed)
Patient ID: Kelli Webster, female   DOB: 01/18/1946, 68 y.o.   MRN: [id number] PCP: Dr. Collins ScotlandSpear  68 yo woman with h/o obesity, PAF, moderate OSA (cannot tolerate CPAP) and systolic HF.   She was admitted in 09/2013 with severe dyspnea for several days and was found to have atrial fibrillation with RVR and volume overload.  EF 20-25% on echo, diffuse hypokinesis.   She was cardioverted twice but both times went back into atrial fibrillation, the 2nd time on amiodarone.    She had an atrial fibrillation ablation in 12/14 that was successful.  TEE in 12/14 showed EF 30%. Sleep study (11/14): AHI 15.5. Sats down to 80 and 85%. Says she cannot tolerate mask or nasal pillows.   Echo 4/15: EF 45%   Follow-up: Patient remains in NSR today.  Saw Dr. Johney FrameAllred in September and suggested she could stop amio. Has not done so yet. She has been doing well.  She is not taking Lasix.  She is not short of breath walking on flat ground.  No chest pain.  No orthopnea or PND.  Weight is up 4 lbs.   Labs (10/14): K 4.5, creatinine 1.1, HIV negative, SPEP/UPEP negative, LDL 62, HDL 45 Labs (10/18/13): K 4.2, creatinine 1.5, digoxin 1.0 Labs (12/14): K 4.2, creatinine 1.4 Labs (1/15): K 4.2, creatinine 1.5, digoxin 0.7, LFTs and TSH normal Labs (4/15): K 5.2 creatinine 1.6, digoxin 1.0    PMH:  1. Obesity 2. GERD 3. OA 4. EGD negative in 10/14 5. ACEI cough 6. Atrial fibrillation: First diagnosed in 10/14.  DCCV to NSR in 09/2013 but atrial fibrillation recurred. Cardioversion 10/18/2013 successful on amiodarone but back in atrial fibrillation in 11/14.  Atrial fibrillation ablation ([facility]red) 12/14.  7. Cardiomyopathy: Echo (10/14) with EF 20-25%, diffuse HK.  LHC (10/14) showed no CAD.  TSH normal, HIV negative, SPEP/UPEP negative.  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  TEE (12/14) with EF 30%, diffuse hypokinesis, mild MR, mildly decreased RV systolic function.  Echo (4/15) with EF 45%, diffuse hypokinesis, mildly dilated  LV, normal RV size and systolic function.  8. Sinus bradycardia 9. OSA: Mild to moderate 10. CKD  SH: Married, [place], nonsmoker.   FH: No history of cardiomyopathy or premature CAD.   ROS: All systems reviewed and negative except as per HPI.   Current Outpatient Prescriptions  Medication Sig Dispense Refill  . amiodarone (PACERONE) 200 MG tablet Take 0.5 tablets (100 mg total) by mouth daily. 30 tablet 6  . apixaban (ELIQUIS) 5 MG TABS tablet Take 1 tablet (5 mg total) by mouth 2 (two) times daily. 180 tablet 3  . carvedilol (COREG) 12.5 MG tablet Take 1 tablet (12.5 mg total) by mouth 2 (two) times daily with a meal. 180 tablet 6  . digoxin (LANOXIN) 0.125 MG tablet Take 1 tablet (0.125 mg total) by mouth every other day. 90 tablet 6  . famotidine (PEPCID) 20 MG tablet Take 1 tablet (20 mg total) by mouth 2 (two) times daily. 180 tablet 6  . losartan (COZAAR) 50 MG tablet TAKE 1/2 BY MOUTH TWICE A DAY 30 tablet 6  . spironolactone (ALDACTONE) 25 MG tablet Take 1 tablet (25 mg total) by mouth daily. 90 tablet 6   No current facility-administered medications for this encounter.    Filed Vitals:   11/05/14 1429  BP: 128/76  Pulse: 59  Weight: 252 lb (114.306 kg)  SpO2: 100%   General: NAD, obese Neck: No JVD, no thyromegaly or thyroid nodule.  Lungs: Clear to auscultation bilaterally with normal respiratory effort. CV: Nondisplaced PMI.  Heart regular S1/S2, no S3/S4, no murmur.  No peripheral edema.  No carotid bruit.  Normal pedal pulses.  Abdomen: Soft, nontender, no hepatosplenomegaly, no distention.  Skin: Intact without lesions or rashes.  Neurologic: Alert and oriented x 3.  Psych: Normal affect. Extremities: No clubbing or cyanosis.   Assessment/Plan: 1. 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 EF up to 45% on last echo.  Euvolemic, ok to stay off Lasix.  - With improvement in EF will stop digoxin. - Continue current Coreg,  losartan, and spironolactone. - Will schedule repeat echo in 3 months.  2. Atrial fibrillation: Patient is in NSR after atrial fibrillation ablation.  She is on amiodarone to maintain NSR.   - Continue apixaban - Dr. Johney Frame has suggested she can stop amio. I agree. Planning to do this in January. 3. OSA:  -She continues to minimize the fact that she has OSA and the effects it can have on her heart. I had a long talk with her about this. But she flatly refuses to try any other devices or go back to the Sleep Clinic. We also discussed nighttime O2 to help with nocturnal desaturations but she refuses this away. She said she will work on weight loss.  4. Morbid obesity  -Discussed need for weight loss  Followup in 3 months.   Kelli Webster 11/05/2014 3:10 PM

## 2014-11-27 ENCOUNTER — Encounter (HOSPITAL_COMMUNITY): Payer: Self-pay | Admitting: Cardiology

## 2014-12-18 ENCOUNTER — Other Ambulatory Visit (HOSPITAL_COMMUNITY): Payer: Self-pay | Admitting: Anesthesiology

## 2014-12-24 ENCOUNTER — Other Ambulatory Visit (HOSPITAL_COMMUNITY): Payer: Self-pay | Admitting: Anesthesiology

## 2014-12-24 DIAGNOSIS — I5022 Chronic systolic (congestive) heart failure: Secondary | ICD-10-CM

## 2015-01-28 ENCOUNTER — Telehealth: Payer: Self-pay | Admitting: Internal Medicine

## 2015-01-28 NOTE — Telephone Encounter (Signed)
New problem ° ° °Pt is having irregular heart beat and need to speak to nurse. Please call pt.  °

## 2015-01-28 NOTE — Telephone Encounter (Signed)
Spoke with patient and she has done great until Dec when she caught a cold from her husband and since then she has noticed her heart skipping.  One week ago Kelli Webster she got stressed after a church meeting and her heart starting racing (upper 80's) and then it stopped but a few times she has had it where her heart rate goes up 62-64 to while walking 15 min twice a day.

## 2015-01-29 NOTE — Telephone Encounter (Signed)
Discussed with Dr Johney Frame and will have her see Rivka Safer next week

## 2015-02-02 ENCOUNTER — Ambulatory Visit: Payer: Self-pay | Admitting: Nurse Practitioner

## 2015-02-12 ENCOUNTER — Ambulatory Visit (INDEPENDENT_AMBULATORY_CARE_PROVIDER_SITE_OTHER): Payer: Medicare Other | Admitting: Nurse Practitioner

## 2015-02-12 ENCOUNTER — Encounter: Payer: Self-pay | Admitting: Nurse Practitioner

## 2015-02-12 VITALS — BP 130/62 | HR 68 | Ht 63.5 in | Wt 248.2 lb

## 2015-02-12 DIAGNOSIS — I493 Ventricular premature depolarization: Secondary | ICD-10-CM

## 2015-02-12 DIAGNOSIS — I4891 Unspecified atrial fibrillation: Secondary | ICD-10-CM

## 2015-02-12 MED ORDER — APIXABAN 5 MG PO TABS: 5.0000 mg | ORAL_TABLET | Freq: Two times a day (BID) | ORAL | Status: DC

## 2015-02-12 NOTE — Patient Instructions (Signed)
Your physician wants you to follow-up in: 6 months with Dr. Jacquiline Doe will receive a reminder letter in the mail two months in advance. If you don't receive a letter, please call our office to schedule the follow-up appointment.  Your physician has recommended you make the following change in your medication: Stop Amiodarone

## 2015-02-12 NOTE — Progress Notes (Signed)
PCP: Herb Grays, MD Primary Cardiologist:  Dr Shirlee Latch  Kelli Webster is a 69 y.o. female who presents today for routine electrophysiology followup.  Since her last visit, the patient reports doing very well.   She is maintaining sinus rhythm post ablation. On last visit, Dr. Johney Frame wanted her to stop amiodarone 100 mg a day but she wanted to wait alitlle longer. She recently saw Dr. Gala Romney who also encouraged her to stop amiodarone and stopped digoxin since her last echo showed improvement in her EF to 45% from 20-25%. Again pt wanted to wait to stop amiodarone. She has recently noticed some irregular heart beat and thought it may be return of afib but by her description sounds more consistent with pc's. Rhythm strip in the office did show a rare PVC. Otherwise, she feels well.  Today, she denies symptoms of palpitations, chest pain, shortness of breath,  lower extremity edema, dizziness, presyncope, or syncope.  The patient is otherwise without complaint today.  She has tried to exercise, walks 3x a week, but has not been successful in weight reduction.  Past Medical History  Diagnosis Date  . GERD (gastroesophageal reflux disease)     a. 09/2013 endoscopy nl  . Morbid obesity   . Persistent atrial fibrillation Chads2vascscore of at least 4(02/12/15)    a. Dx 09/2013 b. s/p PVI by Dr Johney Frame 11-26-2013  . Osteoarthritis   . Chronic systolic heart failure     a. ECHO (09/2013) EF 20-25% diff HK, mild/mod MR, LA mod dil, RV mildly dil, RA mildly dil b. R/LHC (09/26/13) AO 133/88, LV 137/17, Lmain no dz, LAD 30% prox, LCX mild luminal irreg; RCA mild luminal irreg   Past Surgical History  Procedure Laterality Date  . Esophagogastroduodenoscopy N/A 09/25/2013    Procedure: ESOPHAGOGASTRODUODENOSCOPY (EGD);  Surgeon: Graylin Shiver, MD;  Location: [facility] ENDOSCOPY;  Service: Endoscopy;  Laterality: N/A;  . Arthroscopy       left knee    . Fx left wrist      open reduction & internal fixaction  . Tee  without cardioversion N/A 10/01/2013    Procedure: TRANSESOPHAGEAL ECHOCARDIOGRAM (TEE);  Surgeon: Laurey Morale, MD;  Location: [facility] ENDOSCOPY;  Service: Cardiovascular;  Laterality: N/A;  . Cardioversion N/A 10/01/2013    Procedure: CARDIOVERSION;  Surgeon: Laurey Morale, MD;  Location: [facility] ENDOSCOPY;  Service: Cardiovascular;  Laterality: N/A;  . Cardioversion N/A 10/17/2013    Procedure: CARDIOVERSION;  Surgeon: Laurey Morale, MD;  Location: [facility] ENDOSCOPY;  Service: Cardiovascular;  Laterality: N/A;  . Ablation  11-26-2013    PVI by Dr Johney Frame  . Tee without cardioversion N/A 11/26/2013    Procedure: TRANSESOPHAGEAL ECHOCARDIOGRAM (TEE);  Surgeon: Laurey Morale, MD;  Location: [facility] ENDOSCOPY;  Service: Cardiovascular;  Laterality: N/A;  . Left heart catheterization with coronary angiogram  09/26/2013    Procedure: LEFT HEART CATHETERIZATION WITH CORONARY ANGIOGRAM;  Surgeon: Laurey Morale, MD;  Location: [facility] CATH LAB;  Service: Cardiovascular;;  . Atrial fibrillation ablation N/A 11/26/2013    Procedure: ATRIAL FIBRILLATION ABLATION;  Surgeon: Gardiner Rhyme, MD;  Location: [facility] CATH LAB;  Service: Cardiovascular;  Laterality: N/A;    Current Outpatient Prescriptions  Medication Sig Dispense Refill  . apixaban (ELIQUIS) 5 MG TABS tablet Take 1 tablet (5 mg total) by mouth 2 (two) times daily. 180 tablet 3  . carvedilol (COREG) 12.5 MG tablet Take 1 tablet (12.5 mg total) by mouth 2 (two) times daily with a meal. 180 tablet  6  . famotidine (PEPCID) 20 MG tablet Take 1 tablet (20 mg total) by mouth 2 (two) times daily. 180 tablet 6  . losartan (COZAAR) 50 MG tablet TAKE 1/2 BY MOUTH TWICE A DAY 30 tablet 6  . spironolactone (ALDACTONE) 25 MG tablet TAKE 1 TABLET (25 MG TOTAL) BY MOUTH DAILY. 90 tablet 4   No current facility-administered medications for this visit.   ROS- all systems are reviewed and negative except as per HPI above  Physical Exam: Filed Vitals:   02/12/15 1358  BP:  130/62  Pulse: 68  Height: 5' 3.5" (1.613 m)  Weight: 248 lb 3.2 oz (112.583 kg)    GEN- The patient is well appearing, alert and oriented x 3 today.   Head- normocephalic, atraumatic Eyes-  Sclera clear, conjunctiva pink Ears- hearing intact Oropharynx- clear Lungs- Clear to ausculation bilaterally, normal work of breathing Heart- Regular rate and rhythm, no murmurs, rubs or gallops, PMI not laterally displaced GI- soft, NT, ND, + BS Extremities- no clubbing, cyanosis, or edema  ekg today reveals sinus rhythm 68 bpm, PR 196 with rare PVC, nonspecific ST/T changes Echo is reviewed  Assessment and Plan:  1. afib Doing well s/p ablation on amiodarone  daily She is now willing to stop amiodarone. The importance of lifestyle modification including weight reduction and regular exercise were stressed today Chads2vasc score is at least 4- continue apixiban  2. Obesity Body mass index is 43.27 kg/(m^2). Weight reduction advised Regular exercise encouraged.  3. Nonischemic CHF/ chronic systolic dysfunction EF improved with sinus rhythm  4. PVC's Reassured benign  F/u Dr. Johney Frame in 6 months.

## 2015-05-09 ENCOUNTER — Other Ambulatory Visit: Payer: Self-pay | Admitting: Internal Medicine

## 2015-05-12 ENCOUNTER — Other Ambulatory Visit: Payer: Self-pay | Admitting: Cardiology

## 2015-05-13 NOTE — Telephone Encounter (Signed)
Per note 2.25.16

## 2015-06-23 ENCOUNTER — Other Ambulatory Visit: Payer: Self-pay | Admitting: Cardiology

## 2015-06-23 ENCOUNTER — Other Ambulatory Visit (HOSPITAL_COMMUNITY): Payer: Self-pay | Admitting: Cardiology

## 2015-06-23 MED ORDER — FAMOTIDINE 20 MG PO TABS: 20.0000 mg | ORAL_TABLET | Freq: Two times a day (BID) | ORAL | Status: DC

## 2015-07-09 IMAGING — CT CT CHEST W/ CM
2 of 3 series · 15 of 36 positions shown, 18 images · IV contrast (APPLIED)
Comparison: Chest radiograph, 09/23/2013.

CLINICAL DATA: Prominent left hilar opacity noted on the current
chest radiograph. Regional complaint was chest pain and shortness of
breath.

EXAM:
CT CHEST WITH CONTRAST
TECHNIQUE: Multidetector CT imaging of the chest was performed during
intravenous contrast administration.
CONTRAST:  80mL OMNIPAQUE IOHEXOL 300 MG/ML  SOLN

[Series 2: thorax 5.0 i31f 1 · axial · 0.77mm/px · z∈[+1150,+1380]mm · 12 of 56 slices shown, 15 images]
[im 5/56  mediastinal]
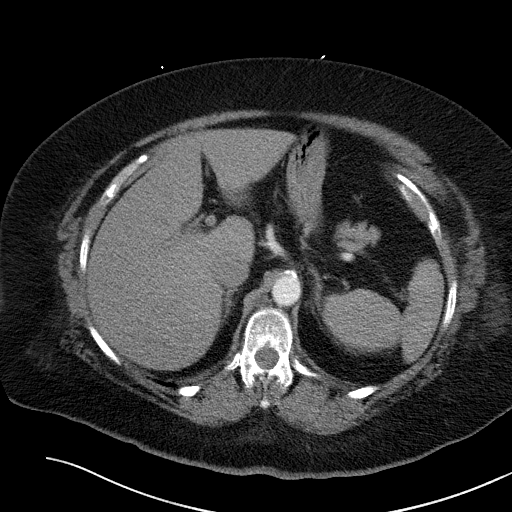
[im 5/56  lung]
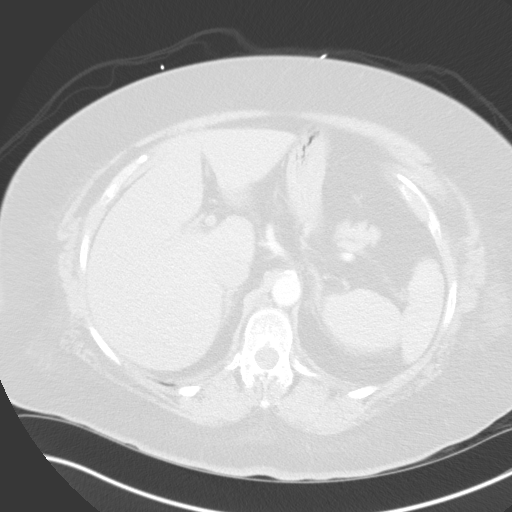
[im 9/56  lung]
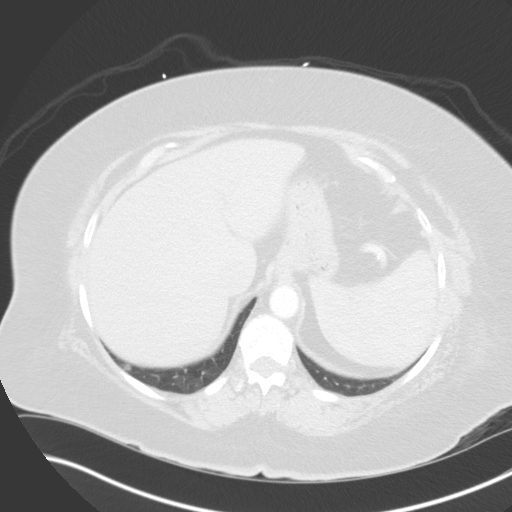
[im 13/56  lung]
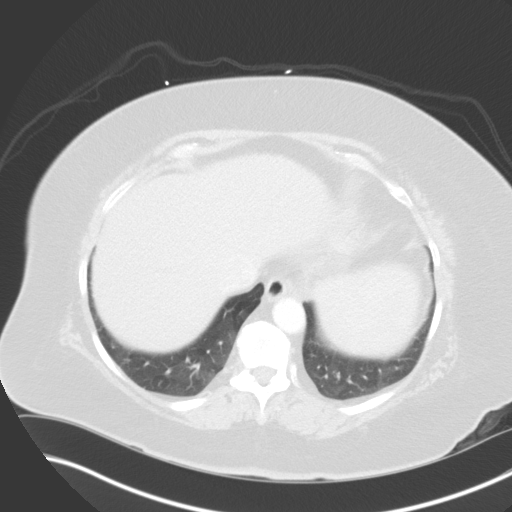
[im 17/56  lung]
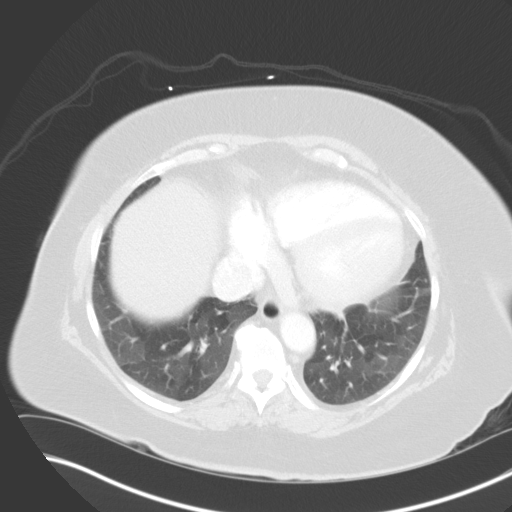
[im 21/56  mediastinal]
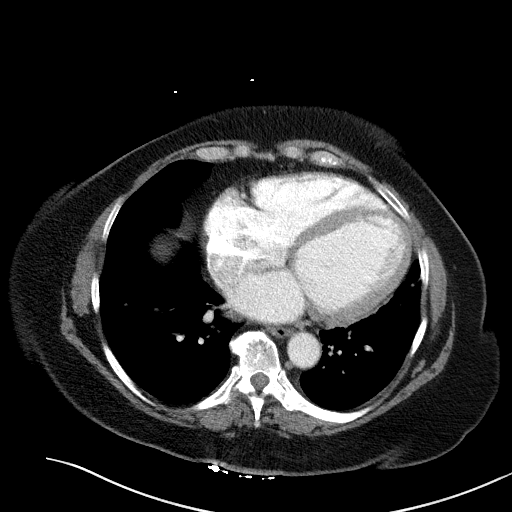
[im 21/56  lung]
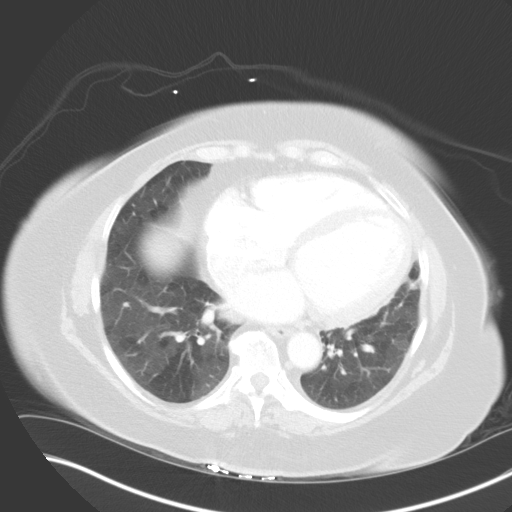
[im 25/56  lung]
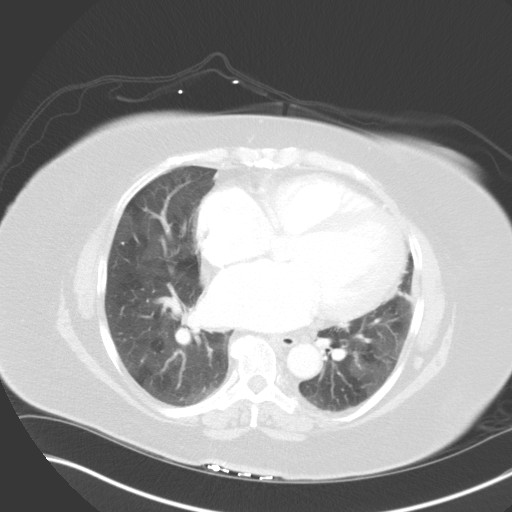
[im 31/56  lung]
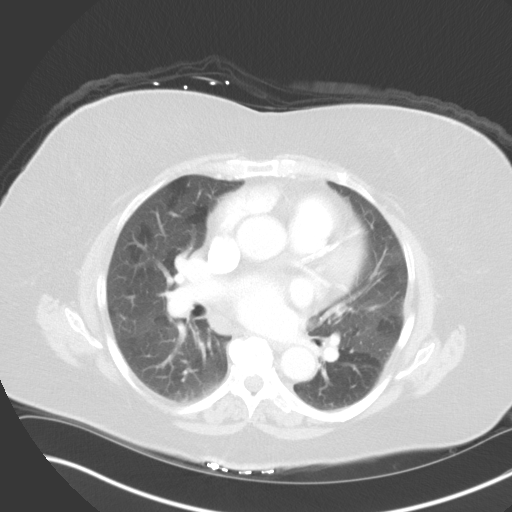
[im 35/56  lung]
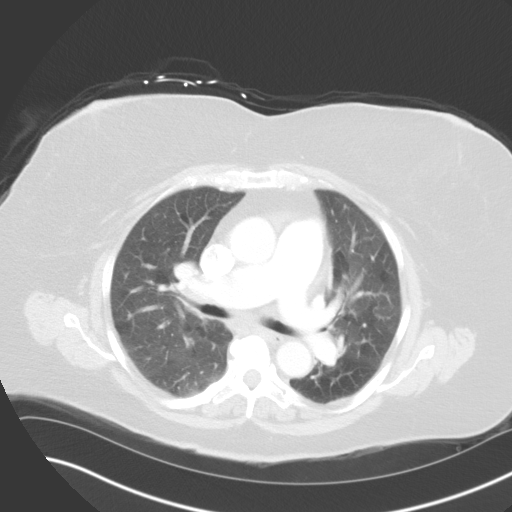
[im 39/56  mediastinal]
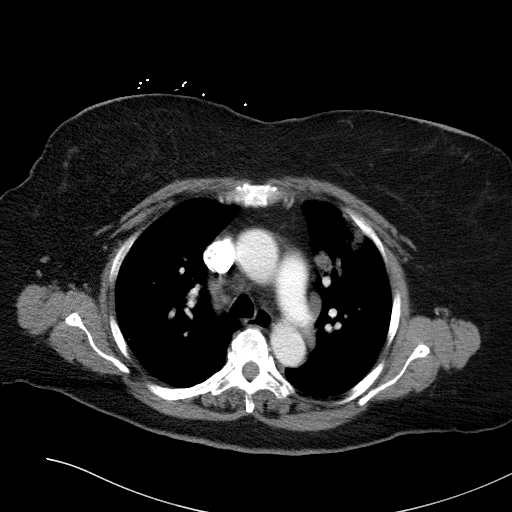
[im 39/56  lung]
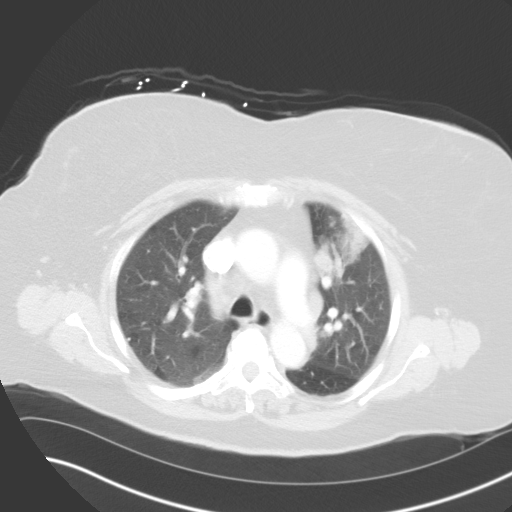
[im 43/56  lung]
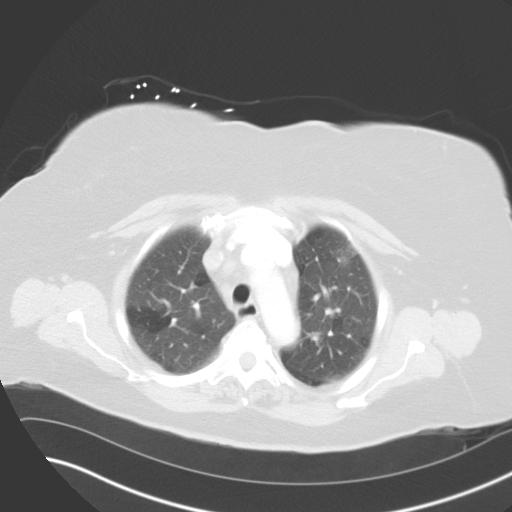
[im 47/56  lung]
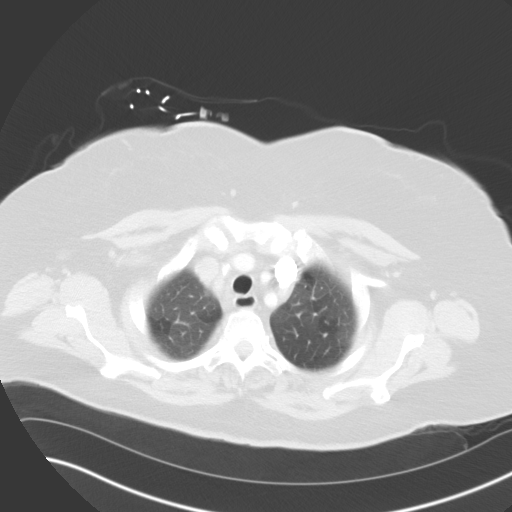
[im 51/56  lung]
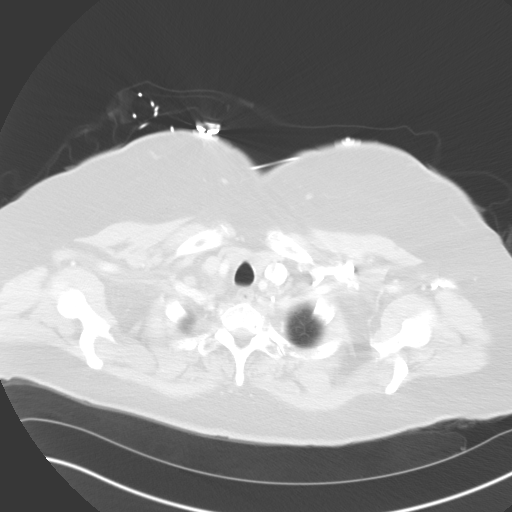

[Series 5: coronal · coronal · 0.53mm/px · 3 of 83 slices shown]
[im 17/83  lung]
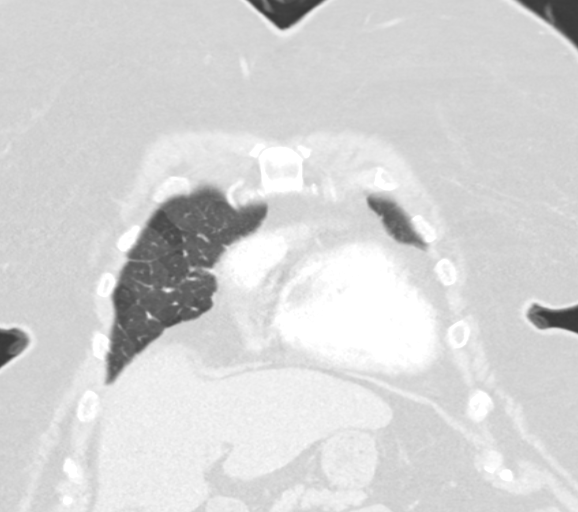
[im 33/83  lung]
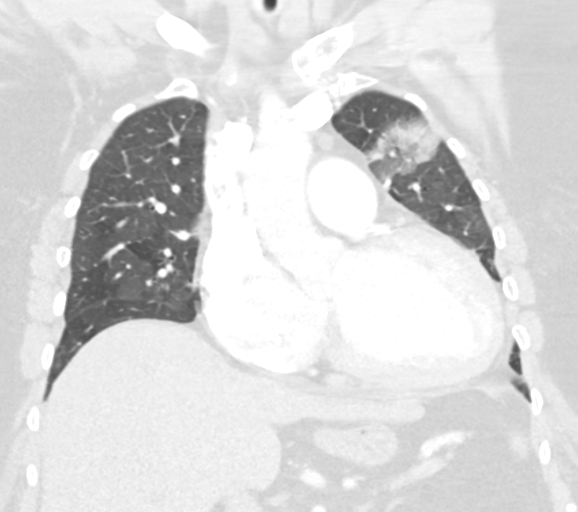
[im 50/83  lung]
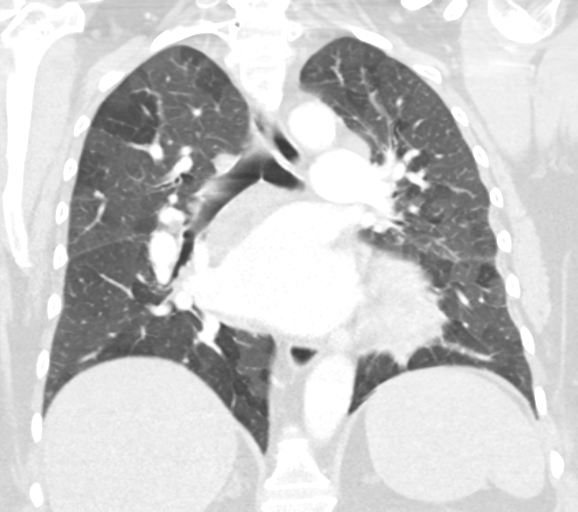

[15 of 36 positions shown; findings below may reference images not displayed]

FINDINGS: There is consolidation in the left upper lobe anteriorly, anterior
to the superior left hilum. There is associated mild mediastinal
adenopathy. Reference matter was made of a prevascular node
measuring 11 mm in short axis and have right subcarinal node
measuring 14 mm in short axis. The left upper lobe consolidation
accounts for the chest radiographic opacity.

Elsewhere in the lungs there is a mosaic pattern of attenuation with
geographic areas of relative lucency interspersed between areas of
subtle increased lung attenuation. This suggests small airways
disease.

No pleural effusion is seen.

The heart is mildly enlarged. There are mild to moderate coronary
artery calcifications. The right pulmonary artery is dilated to 32
mm in the left dilated to 29 mm. The thoracic aorta is normal in
caliber with no dissection.

Both adrenal glands are thickened with the thickened areas being of
lower attenuation likely hyperplasia or adenomas. Evidence of a
small hiatal hernia. The visualized upper abdomen is otherwise
unremarkable.

There are degenerative changes of the visualized spine. No
osteoblastic or osteolytic lesions.
IMPRESSION: 1. The opacity noted on the current chest radiograph is due to an
area of left upper lobe consolidation, most likely pneumonia. There
is associated mild mediastinal adenopathy.
2. In addition, there is also a mosaic pattern of lung attenuation.
This is a nonspecific finding. It may reflect small airways disease.
It could be from occlusive vascular disease with vascular shunting.
The areas of higher attenuation could reflect subtle ground-glass
infiltration from infection or inflammation.
3. Cardiomegaly with enlargement of the pulmonary arteries. This
raises suspicion for pulmonary hypertension.

## 2015-09-09 ENCOUNTER — Other Ambulatory Visit: Payer: Self-pay | Admitting: Cardiology

## 2015-09-15 ENCOUNTER — Encounter (HOSPITAL_COMMUNITY): Payer: Medicare Other

## 2015-09-30 ENCOUNTER — Ambulatory Visit (HOSPITAL_COMMUNITY)
Admission: RE | Admit: 2015-09-30 | Discharge: 2015-09-30 | Disposition: A | Discharge status: Home / Self Care | Payer: Medicare Other | Source: Ambulatory Visit | Attending: Cardiology | Admitting: Cardiology

## 2015-09-30 VITALS — BP 128/68 | HR 64 | Wt 257.4 lb

## 2015-09-30 DIAGNOSIS — Z79899 Other long term (current) drug therapy: Secondary | ICD-10-CM | POA: Diagnosis not present

## 2015-09-30 DIAGNOSIS — K219 Gastro-esophageal reflux disease without esophagitis: Secondary | ICD-10-CM | POA: Insufficient documentation

## 2015-09-30 DIAGNOSIS — N189 Chronic kidney disease, unspecified: Secondary | ICD-10-CM | POA: Insufficient documentation

## 2015-09-30 DIAGNOSIS — G4733 Obstructive sleep apnea (adult) (pediatric): Secondary | ICD-10-CM | POA: Insufficient documentation

## 2015-09-30 DIAGNOSIS — Z7902 Long term (current) use of antithrombotics/antiplatelets: Secondary | ICD-10-CM | POA: Insufficient documentation

## 2015-09-30 DIAGNOSIS — I428 Other cardiomyopathies: Secondary | ICD-10-CM | POA: Diagnosis not present

## 2015-09-30 DIAGNOSIS — I480 Paroxysmal atrial fibrillation: Secondary | ICD-10-CM | POA: Diagnosis not present

## 2015-09-30 DIAGNOSIS — R9431 Abnormal electrocardiogram [ECG] [EKG]: Secondary | ICD-10-CM | POA: Diagnosis not present

## 2015-09-30 DIAGNOSIS — I5022 Chronic systolic (congestive) heart failure: Secondary | ICD-10-CM | POA: Diagnosis not present

## 2015-09-30 LAB — CBC
HCT: 31.7 % — ABNORMAL LOW (ref 36.0–46.0)
Hemoglobin: 9.9 g/dL — ABNORMAL LOW (ref 12.0–15.0)
MCH: 25.8 pg — ABNORMAL LOW (ref 26.0–34.0)
MCHC: 31.2 g/dL (ref 30.0–36.0)
MCV: 82.8 fL (ref 78.0–100.0)
Platelets: 438 10*3/uL — ABNORMAL HIGH (ref 150–400)
RBC: 3.83 MIL/uL — ABNORMAL LOW (ref 3.87–5.11)
RDW: 14.1 % (ref 11.5–15.5)
WBC: 7 10*3/uL (ref 4.0–10.5)

## 2015-09-30 LAB — LIPID PANEL
Cholesterol: 213 mg/dL — ABNORMAL HIGH (ref 0–200)
HDL: 56 mg/dL (ref >40)
LDL Cholesterol: 127 mg/dL — ABNORMAL HIGH (ref 0–99)
Total CHOL/HDL Ratio: 3.8 RATIO
Triglycerides: 150 mg/dL — ABNORMAL HIGH (ref <150)
VLDL: 30 mg/dL (ref 0–40)

## 2015-09-30 LAB — COMPREHENSIVE METABOLIC PANEL
ALT: 12 U/L — ABNORMAL LOW (ref 14–54)
AST: 17 U/L (ref 15–41)
Albumin: 3.6 g/dL (ref 3.5–5.0)
Alkaline Phosphatase: 69 U/L (ref 38–126)
Anion gap: 8 (ref 5–15)
BUN: 22 mg/dL — ABNORMAL HIGH (ref 6–20)
CO2: 24 mmol/L (ref 22–32)
Calcium: 8.9 mg/dL (ref 8.9–10.3)
Chloride: 103 mmol/L (ref 101–111)
Creatinine, Ser: 1.53 mg/dL — ABNORMAL HIGH (ref 0.44–1.00)
GFR calc Af Amer: 39 mL/min — ABNORMAL LOW (ref >60)
GFR calc non Af Amer: 34 mL/min — ABNORMAL LOW (ref >60)
Glucose, Bld: 130 mg/dL — ABNORMAL HIGH (ref 65–99)
Potassium: 4.6 mmol/L (ref 3.5–5.1)
Sodium: 135 mmol/L (ref 135–145)
Total Bilirubin: 0.8 mg/dL (ref 0.3–1.2)
Total Protein: 6.5 g/dL (ref 6.5–8.1)

## 2015-09-30 LAB — TSH: TSH: 2.459 u[IU]/mL (ref 0.350–4.500)

## 2015-09-30 NOTE — Patient Instructions (Signed)
Labs today will call if abnormal   Follow up in 4 months  We will schedule an echocardiogram  Echocardiogram An echocardiogram, or echocardiography, uses sound waves (ultrasound) to produce an image of your heart. The echocardiogram is simple, painless, obtained within a short period of time, and offers valuable information to your health care provider. The images from an echocardiogram can provide information such as:  Evidence of coronary artery disease (CAD).  Heart size.  Heart muscle function.  Heart valve function.  Aneurysm detection.  Evidence of a past heart attack.  Fluid buildup around the heart.  Heart muscle thickening.  Assess heart valve function. LET [facility] CARE PROVIDER KNOW ABOUT:  Any allergies you have.  All medicines you are taking, including vitamins, herbs, eye drops, creams, and over-the-counter medicines.  Previous problems you or members of your family have had with the use of anesthetics.  Any blood disorders you have.  Previous surgeries you have had.  Medical conditions you have.  Possibility of pregnancy, if this applies. BEFORE THE PROCEDURE  No special preparation is needed. Eat and drink normally.  PROCEDURE   In order to produce an image of your heart, gel will be applied to your chest and a wand-like tool (transducer) will be moved over your chest. The gel will help transmit the sound waves from the transducer. The sound waves will harmlessly bounce off your heart to allow the heart images to be captured in real-time motion. These images will then be recorded.  You may need an IV to receive a medicine that improves the quality of the pictures. AFTER THE PROCEDURE You may return to your normal schedule including diet, activities, and medicines, unless your health care provider tells you otherwise.   This information is not intended to replace advice given to you by your health care provider. Make sure you discuss any questions  you have with your health care provider.   Document Released: 12/02/2000 Document Revised: 12/26/2014 Document Reviewed: 08/12/2013 Elsevier Interactive Patient Education [employer]

## 2015-09-30 NOTE — Progress Notes (Signed)
Advanced Heart Failure Medication Review by a Pharmacist  Does the patient  feel that his/her medications are working for him/her?  yes  Has the patient been experiencing any side effects to the medications prescribed?  no  Does the patient measure his/her own blood pressure or blood glucose at home?  yes   Does the patient have any problems obtaining medications due to transportation or finances?   no  Understanding of regimen: excellent Understanding of indications: excellent Potential of compliance: excellent Patient understands to avoid NSAIDs. Patient understands to avoid decongestants.  Issues to address at subsequent visits: None   Pharmacist comments:  Kelli Webster is a pleasant 69 yo F presenting with a current medication list. She has an excellent understanding of her regimen and reports good compliance with all of her medications. She did ask about the safety of OTC cold/allergy products and we discussed not using anything with an NSAID or decongestant in it. She did not have any other medication-related questions or concerns for me at this time.   Tyler Deis. Bonnye Fava, PharmD, BCPS, CPP Clinical Pharmacist Pager: [phone] Phone: [phone] 09/30/2015 2:39 PM   Time with patient: 6 minutes Preparation and documentation time: 2 minutes Total time: 8 minutes

## 2015-10-01 NOTE — Progress Notes (Signed)
Patient ID: Kelli Webster, female   DOB: [birth year], 69 y.o.   MRN: [id number] PCP: Dr. Collins Scotland  69 yo woman with h/o obesity, PAF, moderate OSA (cannot tolerate CPAP) and systolic HF.  She was admitted in 09/2013 with severe dyspnea for several days and was found to have atrial fibrillation with RVR and volume overload.  EF 20-25% on echo, diffuse hypokinesis.   She was cardioverted twice but both times went back into atrial fibrillation, the 2nd time on amiodarone.    She had an atrial fibrillation ablation in 12/14 that was successful.  TEE in 12/14 showed EF 30%.  Repeat echo in 4/15 showed EF up to 45%.  Follow-up: Patient remains in NSR today.  She is now off amiodarone and digoxin.  She is not taking Lasix.  Seems to be doing well.  Feels occasional palpitations but nothing prolonged.  No chest pain, no orthopnea/PND.  Walks for 15-20 minutes/day without dyspnea.   ECG: NSR, low voltage, poor RWP  Labs (10/14): K 4.5, creatinine 1.1, HIV negative, SPEP/UPEP negative, LDL 62, HDL 45 Labs (10/18/13): K 4.2, creatinine 1.5, digoxin 1.0 Labs (12/14): K 4.2, creatinine 1.4 Labs (1/15): K 4.2, creatinine 1.5, digoxin 0.7, LFTs and TSH normal Labs (4/15): K 5.2 creatinine 1.6, digoxin 1.0  Labs (11/15): K 5.1, creatinine 1.4   PMH:  1. Obesity 2. GERD 3. OA 4. EGD negative in 10/14 5. ACEI cough 6. Atrial fibrillation: First diagnosed in 10/14.  DCCV to NSR in 09/2013 but atrial fibrillation recurred. Cardioversion 10/18/2013 successful on amiodarone but back in atrial fibrillation in 11/14.  Atrial fibrillation ablation ([facility]) 12/14.  7. Cardiomyopathy: Echo (10/14) with EF 20-25%, diffuse HK.  LHC (10/14) showed no CAD.  TSH normal, HIV negative, SPEP/UPEP negative.  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  TEE (12/14) with EF 30%, diffuse hypokinesis, mild MR, mildly decreased RV systolic function.  Echo (4/15) with EF 45%, diffuse hypokinesis, mildly dilated LV, normal RV size and systolic  function.  8. Sinus bradycardia 9. OSA: Cannot tolerate CPAP. 10. CKD  SH: Married, lives in [place], nonsmoker.   FH: No history of cardiomyopathy or premature CAD.   ROS: All systems reviewed and negative except as per HPI.   Current Outpatient Prescriptions  Medication Sig Dispense Refill  . apixaban (ELIQUIS) 5 MG TABS tablet Take 1 tablet (5 mg total) by mouth 2 (two) times daily. 180 tablet 3  . carvedilol (COREG) 12.5 MG tablet Take 1 tablet (12.5 mg total) by mouth 2 (two) times daily with a meal. 180 tablet 6  . famotidine (PEPCID) 20 MG tablet Take 1 tablet by mouth two  times daily 180 tablet 3  . losartan (COZAAR) 50 MG tablet TAKE 1/2 BY MOUTH TWICE A DAY 30 tablet 6  . spironolactone (ALDACTONE) 25 MG tablet TAKE 1 TABLET (25 MG TOTAL) BY MOUTH DAILY. 90 tablet 4   No current facility-administered medications for this encounter.    Filed Vitals:   09/30/15 1419  BP: 128/68  Pulse: 64  Weight: 257 lb 6.4 oz (116.756 kg)  SpO2: 100%   General: NAD, obese Neck: No JVD, no thyromegaly or thyroid nodule.  Lungs: Clear to auscultation bilaterally with normal respiratory effort. CV: Nondisplaced PMI.  Heart regular S1/S2, no S3/S4, no murmur.  No peripheral edema.  No carotid bruit.  Normal pedal pulses.  Abdomen: Soft, nontender, no hepatosplenomegaly, no distention.  Skin: Intact without lesions or rashes.  Neurologic: Alert and oriented x 3.  Psych: Normal affect. Extremities:  No clubbing or cyanosis.   Assessment/Plan: 1. 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 EF up to 45% on last echo in 4/15.  Euvolemic on exam, NYHA class II. - Continue current Coreg, losartan, and spironolactone.  Needs BMET today, talked to her about need to get regular BMETs on her current medication regimen. - No need for Lasix.  - Will arrange for echocardiogram.  2. Atrial fibrillation: Patient is in NSR after atrial fibrillation ablation.  She is off  amiodarone. - Continue apixaban, check CBC today.  3. OSA: Does not think she can tolerate CPAP. 4. Morbid obesity: Discussed need for weight loss => more exercise, portion control.  Followup in 4 months.   [name] 10/01/2015

## 2016-01-11 ENCOUNTER — Other Ambulatory Visit: Payer: Self-pay | Admitting: Cardiology

## 2016-01-11 ENCOUNTER — Other Ambulatory Visit (HOSPITAL_COMMUNITY): Payer: Medicare Other

## 2016-02-09 ENCOUNTER — Other Ambulatory Visit: Payer: Self-pay | Admitting: Nurse Practitioner

## 2016-02-16 ENCOUNTER — Other Ambulatory Visit (HOSPITAL_COMMUNITY): Payer: Medicare Other

## 2016-03-11 ENCOUNTER — Other Ambulatory Visit (HOSPITAL_COMMUNITY): Payer: Self-pay | Admitting: Internal Medicine

## 2016-03-31 ENCOUNTER — Other Ambulatory Visit: Payer: Self-pay | Admitting: *Deleted

## 2016-03-31 MED ORDER — APIXABAN 5 MG PO TABS: ORAL_TABLET | ORAL | Status: DC

## 2016-04-07 ENCOUNTER — Other Ambulatory Visit: Payer: Self-pay | Admitting: Cardiology

## 2016-06-06 ENCOUNTER — Ambulatory Visit: Payer: Medicare Other | Admitting: Internal Medicine

## 2016-06-08 ENCOUNTER — Encounter: Payer: Self-pay | Admitting: Internal Medicine

## 2016-06-08 ENCOUNTER — Ambulatory Visit (INDEPENDENT_AMBULATORY_CARE_PROVIDER_SITE_OTHER): Payer: Medicare Other | Admitting: Internal Medicine

## 2016-06-08 VITALS — BP 124/72 | HR 82 | Ht 64.5 in | Wt 260.8 lb

## 2016-06-08 DIAGNOSIS — I5022 Chronic systolic (congestive) heart failure: Secondary | ICD-10-CM

## 2016-06-08 DIAGNOSIS — I480 Paroxysmal atrial fibrillation: Secondary | ICD-10-CM | POA: Diagnosis not present

## 2016-06-08 MED ORDER — APIXABAN 5 MG PO TABS: 5.0000 mg | ORAL_TABLET | Freq: Two times a day (BID) | ORAL | Status: DC

## 2016-06-08 NOTE — Patient Instructions (Signed)

## 2016-06-08 NOTE — Progress Notes (Signed)
PCP: Pamelia Hoit, MD Primary Cardiologist:  Dr Shirlee Latch  Kelli Webster is a 70 y.o. female who presents today for routine electrophysiology followup.  Since her last visit, the patient reports doing very well.   She is maintaining sinus rhythm post ablation off of AAD therapy.  Today, she denies symptoms of palpitations, chest pain, shortness of breath,  lower extremity edema, dizziness, presyncope, or syncope.  The patient is otherwise without complaint today.  She has not lost weight as instructed.  Past Medical History  Diagnosis Date  . GERD (gastroesophageal reflux disease)     a. 09/2013 endoscopy nl  . Morbid obesity (HCC)   . Persistent atrial fibrillation (HCC) Chads2vascscore of at least 4(02/12/15)    a. Dx 09/2013 b. s/p PVI by Dr Johney Frame 11-26-2013  . Osteoarthritis   . Chronic systolic heart failure (HCC)     a. ECHO (09/2013) EF 20-25% diff HK, mild/mod MR, LA mod dil, RV mildly dil, RA mildly dil b. R/LHC (09/26/13) AO 133/88, LV 137/17, Lmain no dz, LAD 30% prox, LCX mild luminal irreg; RCA mild luminal irreg   Past Surgical History  Procedure Laterality Date  . Esophagogastroduodenoscopy N/A 09/25/2013    Procedure: ESOPHAGOGASTRODUODENOSCOPY (EGD);  Surgeon: Graylin Shiver, MD;  Location: [facility] ENDOSCOPY;  Service: Endoscopy;  Laterality: N/A;  . Arthroscopy       left knee    . Fx left wrist      open reduction & internal fixaction  . Tee without cardioversion N/A 10/01/2013    Procedure: TRANSESOPHAGEAL ECHOCARDIOGRAM (TEE);  Surgeon: Laurey Morale, MD;  Location: [facility] ENDOSCOPY;  Service: Cardiovascular;  Laterality: N/A;  . Cardioversion N/A 10/01/2013    Procedure: CARDIOVERSION;  Surgeon: Laurey Morale, MD;  Location: [facility] ENDOSCOPY;  Service: Cardiovascular;  Laterality: N/A;  . Cardioversion N/A 10/17/2013    Procedure: CARDIOVERSION;  Surgeon: Laurey Morale, MD;  Location: [facility] ENDOSCOPY;  Service: Cardiovascular;  Laterality: N/A;  . Ablation  11-26-2013    PVI by  Dr Johney Frame  . Tee without cardioversion N/A 11/26/2013    Procedure: TRANSESOPHAGEAL ECHOCARDIOGRAM (TEE);  Surgeon: Laurey Morale, MD;  Location: [facility] ENDOSCOPY;  Service: Cardiovascular;  Laterality: N/A;  . Left heart catheterization with coronary angiogram  09/26/2013    Procedure: LEFT HEART CATHETERIZATION WITH CORONARY ANGIOGRAM;  Surgeon: Laurey Morale, MD;  Location: [facility] CATH LAB;  Service: Cardiovascular;;  . Atrial fibrillation ablation N/A 11/26/2013    Procedure: ATRIAL FIBRILLATION ABLATION;  Surgeon: Gardiner Rhyme, MD;  Location: [facility] CATH LAB;  Service: Cardiovascular;  Laterality: N/A;    Current Outpatient Prescriptions  Medication Sig Dispense Refill  . apixaban (ELIQUIS) 5 MG TABS tablet Take 5 mg by mouth 2 (two) times daily.    . carvedilol (COREG) 12.5 MG tablet TAKE 1 TABLET BY MOUTH TWICE DAILY WITH A MEAL 180 tablet 4  . famotidine (PEPCID) 20 MG tablet Take 20 mg by mouth 2 (two) times daily as needed for heartburn or indigestion.    Marland Kitchen losartan (COZAAR) 50 MG tablet TAKE 1/2 TABLET BY MOUTH TWICE A DAY 30 tablet 6  . spironolactone (ALDACTONE) 25 MG tablet TAKE 1 TABLET (25 MG TOTAL) BY MOUTH DAILY. 90 tablet 4   No current facility-administered medications for this visit.   ROS- all systems are reviewed and negative except as per HPI above  Physical Exam: Filed Vitals:   06/08/16 1341  BP: 124/72  Pulse: 82  Height: 5' 4.5" (1.638 m)  Weight:  260 lb 12.8 oz (118.298 kg)    GEN- The patient is obese appearing, alert and oriented x 3 today.   Head- normocephalic, atraumatic Eyes-  Sclera clear, conjunctiva pink Ears- hearing intact Oropharynx- clear Lungs- Clear to ausculation bilaterally, normal work of breathing Heart- Regular rate and rhythm, no murmurs, rubs or gallops, PMI not laterally displaced GI- soft, NT, ND, + BS Extremities- no clubbing, cyanosis, or edema  ekg today reveals sinus rhythm 82 bpm, PR 190 msec, anterior infarct pattern,  PVCs  Assessment and Plan:  1. afib Doing well s/p ablation off of AAD therapy The importance of lifestyle modification including weight reduction and regular exercise were stressed today.  If she does not lose weight, her afib will almost certainly return with time. Continue anticoagulation with eliquis.  2. Obesity Body mass index is 44.09 kg/(m^2). Weight reduction advised  3. Nonischemic CHF/ chronic systolic dysfunction Echo pending Followed by Dr Shirlee Latch  Return to see Rudi Coco NP in the afib clinic every 3 months for lifestyle/ AF management I will see when needed  Hillis Range MD, [facility] 06/08/2016 2:04 PM

## 2016-06-13 ENCOUNTER — Other Ambulatory Visit: Payer: Self-pay

## 2016-06-13 ENCOUNTER — Ambulatory Visit (HOSPITAL_COMMUNITY): Payer: Medicare Other | Attending: Internal Medicine

## 2016-06-13 DIAGNOSIS — E669 Obesity, unspecified: Secondary | ICD-10-CM | POA: Diagnosis not present

## 2016-06-13 DIAGNOSIS — Z6841 Body Mass Index (BMI) 40.0 and over, adult: Secondary | ICD-10-CM | POA: Diagnosis not present

## 2016-06-13 DIAGNOSIS — I5022 Chronic systolic (congestive) heart failure: Secondary | ICD-10-CM | POA: Insufficient documentation

## 2016-06-13 DIAGNOSIS — I340 Nonrheumatic mitral (valve) insufficiency: Secondary | ICD-10-CM | POA: Insufficient documentation

## 2016-06-13 DIAGNOSIS — G4733 Obstructive sleep apnea (adult) (pediatric): Secondary | ICD-10-CM | POA: Diagnosis not present

## 2016-06-13 DIAGNOSIS — R29898 Other symptoms and signs involving the musculoskeletal system: Secondary | ICD-10-CM | POA: Insufficient documentation

## 2016-06-13 DIAGNOSIS — I517 Cardiomegaly: Secondary | ICD-10-CM | POA: Diagnosis not present

## 2016-06-13 DIAGNOSIS — I509 Heart failure, unspecified: Secondary | ICD-10-CM | POA: Diagnosis present

## 2016-06-13 LAB — ECHOCARDIOGRAM COMPLETE
Ao-asc: 35 cm
E decel time: 190 msec
E/e' ratio: 9.16
FS: 16 % — AB (ref 28–44)
IVS/LV PW RATIO, ED: 1.02
LA ID, A-P, ES: 50 mm
LA diam end sys: 50 mm
LA diam index: 2.1 cm/m2
LA vol A4C: 112 ml
LA vol index: 41.8 mL/m2
LA vol: 99.7 mL
LV E/e' medial: 9.16
LV E/e'average: 9.16
LV PW d: 9.34 mm — AB (ref 0.6–1.1)
LV e' LATERAL: 8.78 cm/s
LVOT SV: 71 mL
LVOT VTI: 22.5 cm
LVOT area: 3.14 cm2
LVOT diameter: 20 mm
LVOT peak vel: 85 cm/s
Lateral S' vel: 13.1 cm/s
MV Dec: 190
MV Peak grad: 3 mmHg
MV pk A vel: 113 m/s
MV pk E vel: 80.4 m/s
RV sys press: 47 mmHg
Reg peak vel: 314 cm/s
TAPSE: 27.2 mm
TDI e' lateral: 8.78
TDI e' medial: 5.18
TR max vel: 314 cm/s

## 2016-06-20 ENCOUNTER — Telehealth (HOSPITAL_COMMUNITY): Payer: Self-pay | Admitting: Vascular Surgery

## 2016-06-20 NOTE — Telephone Encounter (Signed)
Left pt message to make NP APPT W/ [facility]

## 2016-07-12 ENCOUNTER — Ambulatory Visit (HOSPITAL_COMMUNITY)
Admission: RE | Admit: 2016-07-12 | Discharge: 2016-07-12 | Disposition: A | Discharge status: Home / Self Care | Payer: Medicare Other | Source: Ambulatory Visit | Attending: Cardiology | Admitting: Cardiology

## 2016-07-12 ENCOUNTER — Encounter (HOSPITAL_COMMUNITY): Payer: Self-pay

## 2016-07-12 ENCOUNTER — Telehealth (HOSPITAL_COMMUNITY): Payer: Self-pay | Admitting: Vascular Surgery

## 2016-07-12 VITALS — BP 126/76 | HR 70 | Wt 263.0 lb

## 2016-07-12 DIAGNOSIS — I429 Cardiomyopathy, unspecified: Secondary | ICD-10-CM | POA: Diagnosis not present

## 2016-07-12 DIAGNOSIS — I5022 Chronic systolic (congestive) heart failure: Secondary | ICD-10-CM

## 2016-07-12 DIAGNOSIS — I4891 Unspecified atrial fibrillation: Secondary | ICD-10-CM

## 2016-07-12 DIAGNOSIS — I2510 Atherosclerotic heart disease of native coronary artery without angina pectoris: Secondary | ICD-10-CM | POA: Diagnosis not present

## 2016-07-12 DIAGNOSIS — G4733 Obstructive sleep apnea (adult) (pediatric): Secondary | ICD-10-CM

## 2016-07-12 DIAGNOSIS — K219 Gastro-esophageal reflux disease without esophagitis: Secondary | ICD-10-CM | POA: Insufficient documentation

## 2016-07-12 DIAGNOSIS — Z7901 Long term (current) use of anticoagulants: Secondary | ICD-10-CM | POA: Diagnosis not present

## 2016-07-12 LAB — BASIC METABOLIC PANEL
Anion gap: 5 (ref 5–15)
BUN: 21 mg/dL — ABNORMAL HIGH (ref 6–20)
CO2: 24 mmol/L (ref 22–32)
Calcium: 9 mg/dL (ref 8.9–10.3)
Chloride: 108 mmol/L (ref 101–111)
Creatinine, Ser: 1.45 mg/dL — ABNORMAL HIGH (ref 0.44–1.00)
GFR calc Af Amer: 42 mL/min — ABNORMAL LOW (ref >60)
GFR calc non Af Amer: 36 mL/min — ABNORMAL LOW (ref >60)
Glucose, Bld: 137 mg/dL — ABNORMAL HIGH (ref 65–99)
Potassium: 5.2 mmol/L — ABNORMAL HIGH (ref 3.5–5.1)
Sodium: 137 mmol/L (ref 135–145)

## 2016-07-12 LAB — CBC
HCT: 36.6 % (ref 36.0–46.0)
Hemoglobin: 11.6 g/dL — ABNORMAL LOW (ref 12.0–15.0)
MCH: 27.7 pg (ref 26.0–34.0)
MCHC: 31.7 g/dL (ref 30.0–36.0)
MCV: 87.4 fL (ref 78.0–100.0)
Platelets: 395 10*3/uL (ref 150–400)
RBC: 4.19 MIL/uL (ref 3.87–5.11)
RDW: 13.3 % (ref 11.5–15.5)
WBC: 8 10*3/uL (ref 4.0–10.5)

## 2016-07-12 MED ORDER — SACUBITRIL-VALSARTAN 24-26 MG PO TABS: 1.0000 | ORAL_TABLET | Freq: Two times a day (BID) | ORAL | 3 refills | Status: DC

## 2016-07-12 NOTE — Progress Notes (Signed)
Patient ID: Kelli Webster, female   DOB: Sep 24, 1946, 70 y.o.   MRN: [id number] PCP: Dr. Collins Scotland EP: Dr Johney Frame HF MD: Dr Shirlee Latch.   70 yo woman with h/o obesity, PAF, moderate OSA (cannot tolerate CPAP) and systolic HF.  She was admitted in 09/2013 with severe dyspnea for several days and was found to have atrial fibrillation with RVR and volume overload.  EF 20-25% on echo, diffuse hypokinesis.   She was cardioverted twice but both times went back into atrial fibrillation, the 2nd time on amiodarone.    She had an atrial fibrillation ablation in 12/14 that was successful.  TEE in 12/14 showed EF 30%.  Repeat echo in 4/15 showed EF up to 45%.  Today she returns for HF follow up. Overall feeling ok. Able to walk around the grocery without breaks. Does not tolerate CPAP. Denies PND/Orthopnea/CP. Does not take lasix. Has not been exercising. Has not been weighing at home. No bleeding problems.   Labs (10/14): K 4.5, creatinine 1.1, HIV negative, SPEP/UPEP negative, LDL 62, HDL 45 Labs (10/18/13): K 4.2, creatinine 1.5, digoxin 1.0 Labs (12/14): K 4.2, creatinine 1.4 Labs (1/15): K 4.2, creatinine 1.5, digoxin 0.7, LFTs and TSH normal Labs (4/15): K 5.2 creatinine 1.6, digoxin 1.0  Labs (11/15): K 5.1, creatinine 1.4 Labs 09/2015: K 4.6 Creatinine 1.53    PMH:  1. Obesity 2. GERD 3. OA 4. EGD negative in 10/14 5. ACEI cough 6. Atrial fibrillation: First diagnosed in 10/14.  DCCV to NSR in 09/2013 but atrial fibrillation recurred. Cardioversion 10/18/2013 successful on amiodarone but back in atrial fibrillation in 11/14.  Atrial fibrillation ablation ([facility]) 12/14.  7. Cardiomyopathy: Echo (10/14) with EF 20-25%, diffuse HK.  LHC (10/14) showed no CAD.  TSH normal, HIV negative, SPEP/UPEP negative.  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  TEE (12/14) with EF 30%, diffuse hypokinesis, mild MR, mildly decreased RV systolic function.  Echo (4/15) with EF 45%, diffuse hypokinesis, mildly dilated LV, normal  RV size and systolic function.  LHC 09/2013 Left mainstem: No significant disease.  Left anterior descending (LAD): 30% proximal LAD stenosis. Left circumflex (LCx): Mild luminal irregularities. Right coronary artery (RCA): Mild luminal irregularities. 8. Sinus bradycardia 9. OSA: Cannot tolerate CPAP. 10. CKD 11. ECHO 05/2016 EF 25-30%.   SH: Married, lives in [place], nonsmoker.   FH: No history of cardiomyopathy or premature CAD.   ROS: All systems reviewed and negative except as per HPI.   Current Outpatient Prescriptions  Medication Sig Dispense Refill  . apixaban (ELIQUIS) 5 MG TABS tablet Take 1 tablet (5 mg total) by mouth 2 (two) times daily. 180 tablet 3  . carvedilol (COREG) 12.5 MG tablet TAKE 1 TABLET BY MOUTH TWICE DAILY WITH A MEAL 180 tablet 4  . famotidine (PEPCID) 20 MG tablet Take 20 mg by mouth 2 (two) times daily as needed for heartburn or indigestion.    Marland Kitchen losartan (COZAAR) 50 MG tablet TAKE 1/2 TABLET BY MOUTH TWICE A DAY 30 tablet 6  . spironolactone (ALDACTONE) 25 MG tablet TAKE 1 TABLET (25 MG TOTAL) BY MOUTH DAILY. 90 tablet 4   No current facility-administered medications for this encounter.     Vitals:   07/12/16 1218  BP: 126/76  Pulse: 70  SpO2: 99%  Weight: 263 lb (119.3 kg)   General: NAD, obese Neck: JVD ~10, no thyromegaly or thyroid nodule.  Lungs: Clear to auscultation bilaterally with normal respiratory effort. CV: Nondisplaced PMI.  Heart regular S1/S2, no S3/S4, no murmur.  No  peripheral edema.  No carotid bruit.  Normal pedal pulses.  Abdomen: obese, Soft, nontender, no hepatosplenomegaly, no distention.  Skin: Intact without lesions or rashes.  Neurologic: Alert and oriented x 3.  Psych: Normal affect. Extremities: No clubbing or cyanosis.   EKG: NSR 67 bpm   Assessment/Plan: 1. 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 EF back down 25-30% June 2017 but not sure the etiology as she is maintaining a  NSR.  Functionally doing ok. NYHA class II.  Continue current Coreg and spironolactone.  -Mild volume overload. Stop losartan and start entresto 24-26 mg twice a day. Check BMET 10 days .  Today I have asked her to weigh and record daily, follow low salt diet, and limit fluid intake to < 2 liters per day.  2. Atrial fibrillation: Patient is in NSR today. She is off amiodarone. - Continue apixaban. CBC today . Todays hemoglobin 11.6  3. OSA: Does tolerate CPAP. 4. Morbid obesity: Discussed need for weight loss.   Follow up in 10 days for BMET and 4 weeks with Dr Shirlee Latch.  BMET today K 5.2 Creatinine 1.45. She will be called to follow low potassium diet. May need to add low dose lasix 20 mg daily   Amy Clegg NP-C  07/12/2016

## 2016-07-12 NOTE — Patient Instructions (Signed)
Stop Losartan  Start Entresto 24/26 mg Twice daily   Labs today  Labs in 2 weeks  Your physician recommends that you schedule a follow-up appointment in: 1 month

## 2016-07-12 NOTE — Telephone Encounter (Signed)
Left pt message to call back to make f/u appt and lab

## 2016-07-19 ENCOUNTER — Telehealth (HOSPITAL_COMMUNITY): Payer: Self-pay | Admitting: *Deleted

## 2016-07-19 NOTE — Telephone Encounter (Signed)
Pt called and stated she tried Entresto for two days and could not tolerate it. She said she was extremely short of breath and went back on losartan.  She is scheduled for labs on 8/11. She wants to know if this is ok please advise.

## 2016-07-19 NOTE — Telephone Encounter (Signed)
Notes Recorded by Modesta Messing, CMA on 07/19/2016 at 4:55 PM EDT Pt agrees to cut back on K in diet. On lab schedule for 07/29/16 ------  Notes Recorded by Laurey Morale, MD on 07/16/2016 at 10:15 PM EDT Cut back on K in diet, repeat BMET 10 days. ------  Notes Recorded by Noralee Space, RN on 07/13/2016 at 4:32 PM EDT Labs reviewed by RN, will forward to MD for review    Ref Range & Units 7d ago 43mo ago 71yr ago   Sodium 135 - 145 mmol/L 137 135 140R   Potassium 3.5 - 5.1 mmol/L 5.2  4.6 5.1R   Chloride 101 - 111 mmol/L 108 103 102R   CO2 22 - 32 mmol/L 24 24 24R   Glucose, Bld 65 - 99 mg/dL 989  211  941D    BUN 6 - 20 mg/dL 21  22  40C    Creatinine, Ser 0.44 - 1.00 mg/dL 1.44  8.18  5.63J    Calcium 8.9 - 10.3 mg/dL 9.0 8.9 4.9F   GFR calc non Af Amer >60 mL/min 36  34  38R    GFR calc Af Amer >60 mL/min 42  39CM

## 2016-07-19 NOTE — Telephone Encounter (Signed)
That is fine, as long as she feels better on losartan.  Unusual response.

## 2016-07-20 MED ORDER — LOSARTAN POTASSIUM 50 MG PO TABS: 25.0000 mg | ORAL_TABLET | Freq: Two times a day (BID) | ORAL | 3 refills | Status: DC

## 2016-07-20 NOTE — Telephone Encounter (Signed)
Medication updated in patient's chart.

## 2016-07-29 ENCOUNTER — Ambulatory Visit (HOSPITAL_COMMUNITY)
Admission: RE | Admit: 2016-07-29 | Discharge: 2016-07-29 | Disposition: A | Discharge status: Home / Self Care | Payer: Medicare Other | Source: Ambulatory Visit | Attending: Internal Medicine | Admitting: Internal Medicine

## 2016-07-29 DIAGNOSIS — I5022 Chronic systolic (congestive) heart failure: Secondary | ICD-10-CM | POA: Diagnosis not present

## 2016-07-29 LAB — BASIC METABOLIC PANEL
Anion gap: 9 (ref 5–15)
BUN: 35 mg/dL — ABNORMAL HIGH (ref 6–20)
CO2: 24 mmol/L (ref 22–32)
Calcium: 9.1 mg/dL (ref 8.9–10.3)
Chloride: 104 mmol/L (ref 101–111)
Creatinine, Ser: 1.53 mg/dL — ABNORMAL HIGH (ref 0.44–1.00)
GFR calc Af Amer: 39 mL/min — ABNORMAL LOW (ref >60)
GFR calc non Af Amer: 34 mL/min — ABNORMAL LOW (ref >60)
Glucose, Bld: 117 mg/dL — ABNORMAL HIGH (ref 65–99)
Potassium: 4.8 mmol/L (ref 3.5–5.1)
Sodium: 137 mmol/L (ref 135–145)

## 2016-08-17 ENCOUNTER — Encounter (HOSPITAL_COMMUNITY): Payer: Medicare Other

## 2016-08-29 ENCOUNTER — Encounter: Payer: Self-pay | Admitting: *Deleted

## 2016-09-09 ENCOUNTER — Other Ambulatory Visit: Payer: Self-pay | Admitting: Cardiology

## 2016-09-12 ENCOUNTER — Ambulatory Visit: Payer: Medicare Other | Admitting: Internal Medicine

## 2016-09-12 ENCOUNTER — Other Ambulatory Visit (HOSPITAL_COMMUNITY): Payer: Self-pay | Admitting: *Deleted

## 2016-09-12 MED ORDER — LOSARTAN POTASSIUM 50 MG PO TABS: 25.0000 mg | ORAL_TABLET | Freq: Two times a day (BID) | ORAL | 3 refills | Status: DC

## 2016-09-13 ENCOUNTER — Other Ambulatory Visit (HOSPITAL_COMMUNITY): Payer: Self-pay | Admitting: *Deleted

## 2016-09-20 ENCOUNTER — Ambulatory Visit (HOSPITAL_COMMUNITY)
Admission: RE | Admit: 2016-09-20 | Discharge: 2016-09-20 | Disposition: A | Discharge status: Home / Self Care | Payer: Medicare Other | Source: Ambulatory Visit | Attending: Cardiology | Admitting: Cardiology

## 2016-09-20 ENCOUNTER — Encounter (HOSPITAL_COMMUNITY): Payer: Self-pay

## 2016-09-20 VITALS — BP 124/76 | HR 72 | Wt 261.5 lb

## 2016-09-20 DIAGNOSIS — I480 Paroxysmal atrial fibrillation: Secondary | ICD-10-CM | POA: Insufficient documentation

## 2016-09-20 DIAGNOSIS — Z79899 Other long term (current) drug therapy: Secondary | ICD-10-CM | POA: Insufficient documentation

## 2016-09-20 DIAGNOSIS — I5022 Chronic systolic (congestive) heart failure: Secondary | ICD-10-CM | POA: Diagnosis present

## 2016-09-20 DIAGNOSIS — Z7901 Long term (current) use of anticoagulants: Secondary | ICD-10-CM | POA: Diagnosis not present

## 2016-09-20 DIAGNOSIS — I428 Other cardiomyopathies: Secondary | ICD-10-CM | POA: Insufficient documentation

## 2016-09-20 DIAGNOSIS — E669 Obesity, unspecified: Secondary | ICD-10-CM | POA: Insufficient documentation

## 2016-09-20 DIAGNOSIS — G4733 Obstructive sleep apnea (adult) (pediatric): Secondary | ICD-10-CM | POA: Diagnosis not present

## 2016-09-20 LAB — BASIC METABOLIC PANEL
Anion gap: 8 (ref 5–15)
BUN: 26 mg/dL — ABNORMAL HIGH (ref 6–20)
CO2: 21 mmol/L — ABNORMAL LOW (ref 22–32)
Calcium: 9.3 mg/dL (ref 8.9–10.3)
Chloride: 110 mmol/L (ref 101–111)
Creatinine, Ser: 1.36 mg/dL — ABNORMAL HIGH (ref 0.44–1.00)
GFR calc Af Amer: 45 mL/min — ABNORMAL LOW (ref >60)
GFR calc non Af Amer: 39 mL/min — ABNORMAL LOW (ref >60)
Glucose, Bld: 145 mg/dL — ABNORMAL HIGH (ref 65–99)
Potassium: 4.7 mmol/L (ref 3.5–5.1)
Sodium: 139 mmol/L (ref 135–145)

## 2016-09-20 LAB — BRAIN NATRIURETIC PEPTIDE: B Natriuretic Peptide: 193.9 pg/mL — ABNORMAL HIGH (ref 0.0–100.0)

## 2016-09-20 MED ORDER — LOSARTAN POTASSIUM 50 MG PO TABS: ORAL_TABLET | ORAL | 6 refills | Status: DC

## 2016-09-20 NOTE — Patient Instructions (Signed)
Increase losartan to 50 mg (1 tab) in AM and 25 mg (1/2 tab) in PM  Labs today  Labs in 2 weeks  You have been referred to Fara Chute D in 1 month  Your physician recommends that you schedule a follow-up appointment in: 2 months

## 2016-09-21 NOTE — Progress Notes (Signed)
Patient ID: Kelli Webster, female   DOB: 02/23/1946, 70 y.o.   MRN: [id number] PCP: Dr. Collins ScotlandSpear EP: Dr Johney FrameAllred HF MD: Dr Shirlee LatchMcLean.   70 yo woman with h/o obesity, PAF, moderate OSA (cannot tolerate CPAP) and systolic HF.  She was admitted in 09/2013 with severe dyspnea for several days and was found to have atrial fibrillation with RVR and volume overload.  EF 20-25% on echo, diffuse hypokinesis.   She was cardioverted twice but both times went back into atrial fibrillation, the 2nd time on amiodarone.    She had an atrial fibrillation ablation in 12/14 that was successful.  TEE in 12/14 showed EF 30%.  Repeat echo in 4/15 showed EF up to 45%.  Most recent echo in 6/17 showed EF down to 25-30%.   Today she returns for HF follow up.  Symptomatically stable.  No dyspnea with ADLs, able to climb steps.  No chest pain. She has not been using Lasix.  No palpitations.  She is in NSR today. She was tried on [place] after a prior visit and did not tolerate due to lightheadedness.  Weight is down 2 lbs.   Labs (10/14): K 4.5, creatinine 1.1, HIV negative, SPEP/UPEP negative, LDL 62, HDL 45 Labs (10/18/13): K 4.2, creatinine 1.5, digoxin 1.0 Labs (12/14): K 4.2, creatinine 1.4 Labs (1/15): K 4.2, creatinine 1.5, digoxin 0.7, LFTs and TSH normal Labs (4/15): K 5.2 creatinine 1.6, digoxin 1.0  Labs (11/15): K 5.1, creatinine 1.4 Labs 09/2015: K 4.6 Creatinine 1.53  Labs (8/17): K 4.8, creatinine 1.53, hgb 11.6   PMH:  1. Obesity 2. GERD 3. OA 4. EGD negative in 10/14 5. ACEI cough 6. Atrial fibrillation: First diagnosed in 10/14.  DCCV to NSR in 09/2013 but atrial fibrillation recurred. Cardioversion 10/18/2013 successful on amiodarone but back in atrial fibrillation in 11/14.  Atrial fibrillation ablation ([facility]) 12/14.  7. Cardiomyopathy: Echo (10/14) with EF 20-25%, diffuse HK.  LHC (10/14) showed no CAD.  TSH normal, HIV negative, SPEP/UPEP negative.  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  TEE (12/14)  with EF 30%, diffuse hypokinesis, mild MR, mildly decreased RV systolic function.  Echo (4/15) with EF 45%, diffuse hypokinesis, mildly dilated LV, normal RV size and systolic function.  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 - Echo (6/17) with EF 25-30%, mild MR.  8. Sinus bradycardia 9. OSA: Cannot tolerate CPAP. 10. CKD  SH: Married, [place], nonsmoker.   FH: No history of cardiomyopathy or premature CAD.   ROS: All systems reviewed and negative except as per HPI.   Current Outpatient Prescriptions  Medication Sig Dispense Refill  . apixaban (ELIQUIS) 5 MG TABS tablet Take 1 tablet (5 mg total) by mouth 2 (two) times daily. 180 tablet 3  . carvedilol (COREG) 12.5 MG tablet TAKE 1 TABLET BY MOUTH TWICE DAILY WITH A MEAL 180 tablet 4  . losartan (COZAAR) 50 MG tablet Take 1 tab in AM and 1/2 tab in PM 45 tablet 6  . spironolactone (ALDACTONE) 25 MG tablet TAKE 1 TABLET (25 MG TOTAL) BY MOUTH DAILY. 90 tablet 4  . famotidine (PEPCID) 20 MG tablet Take 20 mg by mouth 2 (two) times daily as needed for heartburn or indigestion.     No current facility-administered medications for this encounter.     Vitals:   09/20/16 1344  BP: 124/76  Pulse: 72  SpO2: 100%  Weight: 261 lb 8 oz (118.6 kg)   General: NAD, obese Neck: JVD 7, no thyromegaly or thyroid nodule.  Lungs: Clear to auscultation bilaterally with normal respiratory effort. CV: Nondisplaced PMI.  Heart regular S1/S2, no S3/S4, no murmur.  No peripheral edema.  No carotid bruit.  Normal pedal pulses.  Abdomen: obese, Soft, nontender, no hepatosplenomegaly, no distention.  Skin: Intact without lesions or rashes.  Neurologic: Alert and oriented x 3.  Psych: Normal affect. Extremities: No clubbing or cyanosis.   Assessment/Plan: 1. 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 Functionally doing ok. NYHA  class II.   - Continue current Coreg and spironolactone.  - Not able to tolerate Entresto due to lightheadedness.  Increase losartan to 50 qam/25 qpm.  BMET/BNP today and repeat in 2 wks.  - Volume stable, I do not think she needs Lasix.  - Will continue medical management with medication titration and get a cardiac MRI in 12/17.  If EF remains low, can consider ICD.  She has narrow QRS so not CRT candidate.   2. Atrial fibrillation: Patient is in NSR today. She is off amiodarone. - Continue apixaban.  3. OSA: Does tolerate CPAP.  She will followup with pharmacist in 1 month for medication titration and me in 2 months, will then arrange cardiac MRI.  [name] 09/21/2016

## 2016-10-03 ENCOUNTER — Ambulatory Visit (HOSPITAL_COMMUNITY)
Admission: RE | Admit: 2016-10-03 | Discharge: 2016-10-03 | Disposition: A | Discharge status: Home / Self Care | Payer: Medicare Other | Source: Ambulatory Visit | Attending: Internal Medicine | Admitting: Internal Medicine

## 2016-10-03 DIAGNOSIS — I5022 Chronic systolic (congestive) heart failure: Secondary | ICD-10-CM | POA: Diagnosis not present

## 2016-10-03 LAB — BASIC METABOLIC PANEL
Anion gap: 6 (ref 5–15)
BUN: 40 mg/dL — ABNORMAL HIGH (ref 6–20)
CO2: 24 mmol/L (ref 22–32)
Calcium: 8.7 mg/dL — ABNORMAL LOW (ref 8.9–10.3)
Chloride: 110 mmol/L (ref 101–111)
Creatinine, Ser: 1.73 mg/dL — ABNORMAL HIGH (ref 0.44–1.00)
GFR calc Af Amer: 34 mL/min — ABNORMAL LOW (ref >60)
GFR calc non Af Amer: 29 mL/min — ABNORMAL LOW (ref >60)
Glucose, Bld: 146 mg/dL — ABNORMAL HIGH (ref 65–99)
Potassium: 4.4 mmol/L (ref 3.5–5.1)
Sodium: 140 mmol/L (ref 135–145)

## 2016-10-14 ENCOUNTER — Other Ambulatory Visit (HOSPITAL_COMMUNITY): Payer: Self-pay | Admitting: *Deleted

## 2016-10-14 DIAGNOSIS — I5022 Chronic systolic (congestive) heart failure: Secondary | ICD-10-CM

## 2016-10-17 ENCOUNTER — Ambulatory Visit (HOSPITAL_COMMUNITY)
Admission: RE | Admit: 2016-10-17 | Discharge: 2016-10-17 | Disposition: A | Discharge status: Home / Self Care | Payer: Medicare Other | Source: Ambulatory Visit | Attending: Cardiology | Admitting: Cardiology

## 2016-10-17 ENCOUNTER — Ambulatory Visit (HOSPITAL_BASED_OUTPATIENT_CLINIC_OR_DEPARTMENT_OTHER)
Admission: RE | Admit: 2016-10-17 | Discharge: 2016-10-17 | Disposition: A | Discharge status: Home / Self Care | Payer: Medicare Other | Source: Ambulatory Visit | Attending: Cardiology | Admitting: Cardiology

## 2016-10-17 DIAGNOSIS — I5022 Chronic systolic (congestive) heart failure: Secondary | ICD-10-CM

## 2016-10-17 LAB — BASIC METABOLIC PANEL
Anion gap: 8 (ref 5–15)
Anion gap: 8 (ref 5–15)
BUN: 10 mg/dL (ref 6–20)
BUN: 33 mg/dL — ABNORMAL HIGH (ref 6–20)
CO2: 25 mmol/L (ref 22–32)
CO2: 23 mmol/L (ref 22–32)
Calcium: 10 mg/dL (ref 8.9–10.3)
Calcium: 9.2 mg/dL (ref 8.9–10.3)
Chloride: 109 mmol/L (ref 101–111)
Chloride: 107 mmol/L (ref 101–111)
Creatinine, Ser: 0.59 mg/dL (ref 0.44–1.00)
Creatinine, Ser: 1.37 mg/dL — ABNORMAL HIGH (ref 0.44–1.00)
GFR calc Af Amer: >60 mL/min (ref >60)
GFR calc Af Amer: 44 mL/min — ABNORMAL LOW (ref >60)
GFR calc non Af Amer: >60 mL/min (ref >60)
GFR calc non Af Amer: 38 mL/min — ABNORMAL LOW (ref >60)
Glucose, Bld: 103 mg/dL — ABNORMAL HIGH (ref 65–99)
Glucose, Bld: 146 mg/dL — ABNORMAL HIGH (ref 65–99)
Potassium: 4.2 mmol/L (ref 3.5–5.1)
Potassium: 4.7 mmol/L (ref 3.5–5.1)
Sodium: 142 mmol/L (ref 135–145)
Sodium: 138 mmol/L (ref 135–145)

## 2016-10-17 LAB — BRAIN NATRIURETIC PEPTIDE
B Natriuretic Peptide: 53.7 pg/mL (ref 0.0–100.0)
B Natriuretic Peptide: 73.6 pg/mL (ref 0.0–100.0)

## 2016-10-21 ENCOUNTER — Encounter: Payer: Self-pay | Admitting: Internal Medicine

## 2016-10-21 ENCOUNTER — Ambulatory Visit (INDEPENDENT_AMBULATORY_CARE_PROVIDER_SITE_OTHER): Payer: Medicare Other | Admitting: Internal Medicine

## 2016-10-21 VITALS — BP 134/84 | HR 87 | Ht 63.5 in | Wt 264.0 lb

## 2016-10-21 DIAGNOSIS — I5022 Chronic systolic (congestive) heart failure: Secondary | ICD-10-CM | POA: Diagnosis not present

## 2016-10-21 DIAGNOSIS — I480 Paroxysmal atrial fibrillation: Secondary | ICD-10-CM

## 2016-10-21 NOTE — Patient Instructions (Signed)

## 2016-10-21 NOTE — Progress Notes (Signed)
PCP: Pamelia Hoit, MD Primary Cardiologist:  Dr Shirlee Latch  Kelli Webster is a 70 y.o. female who presents today for routine electrophysiology followup.  Since her last visit, the patient reports doing very well.   She is maintaining sinus rhythm post ablation off of AAD therapy.  Unfortunately, she has made no real progress with lifestyle modification.  She has had worsening of her EF for which she follows with Dr Shirlee Latch.  Today, she denies symptoms of palpitations, chest pain, shortness of breath,  lower extremity edema, dizziness, presyncope, or syncope.  The patient is otherwise without complaint today.  She has not lost weight as instructed.  Past Medical History:  Diagnosis Date  . Chronic systolic heart failure (HCC)    a. ECHO (09/2013) EF 20-25% diff HK, mild/mod MR, LA mod dil, RV mildly dil, RA mildly dil b. R/LHC (09/26/13) AO 133/88, LV 137/17, Lmain no dz, LAD 30% prox, LCX mild luminal irreg; RCA mild luminal irreg  . GERD (gastroesophageal reflux disease)    a. 09/2013 endoscopy nl  . Morbid obesity (HCC)   . Osteoarthritis   . Persistent atrial fibrillation (HCC) Chads2vascscore of at least 4(02/12/15)   a. Dx 09/2013 b. s/p PVI by Dr Johney Frame 11-26-2013   Past Surgical History:  Procedure Laterality Date  . ABLATION  11-26-2013   PVI by Dr Johney Frame  . arthroscopy       left knee    . ATRIAL FIBRILLATION ABLATION N/A 11/26/2013   Procedure: ATRIAL FIBRILLATION ABLATION;  Surgeon: Gardiner Rhyme, MD;  Location: [facility] CATH LAB;  Service: Cardiovascular;  Laterality: N/A;  . CARDIOVERSION N/A 10/01/2013   Procedure: CARDIOVERSION;  Surgeon: Laurey Morale, MD;  Location: [facility] ENDOSCOPY;  Service: Cardiovascular;  Laterality: N/A;  . CARDIOVERSION N/A 10/17/2013   Procedure: CARDIOVERSION;  Surgeon: Laurey Morale, MD;  Location[facility] ENDOSCOPY;  Service: Cardiovascular;  Laterality: N/A;  . ESOPHAGOGASTRODUODENOSCOPY N/A 09/25/2013   Procedure: ESOPHAGOGASTRODUODENOSCOPY (EGD);   Surgeon: Graylin Shiver, MD;  Location: [facility] ENDOSCOPY;  Service: Endoscopy;  Laterality: N/A;  . fx left wrist     open reduction & internal fixaction  . LEFT HEART CATHETERIZATION WITH CORONARY ANGIOGRAM  09/26/2013   Procedure: LEFT HEART CATHETERIZATION WITH CORONARY ANGIOGRAM;  Surgeon: Laurey Morale, MD;  Location: [facility] CATH LAB;  Service: Cardiovascular;;  . TEE WITHOUT CARDIOVERSION N/A 10/01/2013   Procedure: TRANSESOPHAGEAL ECHOCARDIOGRAM (TEE);  Surgeon: Laurey Morale, MD;  Location: [facility] ENDOSCOPY;  Service: Cardiovascular;  Laterality: N/A;  . TEE WITHOUT CARDIOVERSION N/A 11/26/2013   Procedure: TRANSESOPHAGEAL ECHOCARDIOGRAM (TEE);  Surgeon: Laurey Morale, MD;  Location: [facility] ENDOSCOPY;  Service: Cardiovascular;  Laterality: N/A;    Current Outpatient Prescriptions  Medication Sig Dispense Refill  . apixaban (ELIQUIS) 5 MG TABS tablet Take 1 tablet (5 mg total) by mouth 2 (two) times daily. 180 tablet 3  . carvedilol (COREG) 12.5 MG tablet TAKE 1 TABLET BY MOUTH TWICE DAILY WITH A MEAL 180 tablet 4  . famotidine (PEPCID) 20 MG tablet Take 20 mg by mouth 2 (two) times daily as needed for heartburn or indigestion.    Marland Kitchen losartan (COZAAR) 50 MG tablet Take 1 tab in AM and 1/2 tab in PM (Patient taking differently: Take 1/2 tab in AM and 1/2 tab in PM) 45 tablet 6  . spironolactone (ALDACTONE) 25 MG tablet TAKE 1 TABLET (25 MG TOTAL) BY MOUTH DAILY. 90 tablet 4   No current facility-administered medications for this visit.  ROS- all systems are reviewed and negative except as per HPI above  Physical Exam: Vitals:   10/21/16 1631  BP: 134/84  Pulse: 87  Weight: 264 lb (119.7 kg)  Height: 5' 3.5" (1.613 m)    GEN- The patient is obese appearing, alert and oriented x 3 today.   Head- normocephalic, atraumatic Eyes-  Sclera clear, conjunctiva pink Ears- hearing intact Oropharynx- clear Lungs- Clear to ausculation bilaterally, normal work of breathing Heart- Regular rate and  rhythm, no murmurs, rubs or gallops, PMI not laterally displaced GI- soft, NT, ND, + BS Extremities- no clubbing, cyanosis, or edema  ekg today reveals sinus rhythm 87 bpm, PR 184 msec, nonspecific ST/T changes  Assessment and Plan:  1. afib Doing well s/p ablation off of AAD therapy The importance of lifestyle modification including weight reduction and regular exercise were stressed today.  If she does not lose weight, her afib will almost certainly return with time. Continue anticoagulation with eliquis.  2. Obesity Body mass index is 46.03 kg/m. Weight reduction advised  3. Nonischemic CHF/ chronic systolic dysfunction Followed by Dr Shirlee LatchMcLean We briefly discussed consideration of an ICD should her EF fail to recover.  She is very clear that she is not interested in an ICD at this time.  Return to see me for AF management in 6 months  Hillis RangeJames Rip Hawes MD, [facility] 10/21/2016 5:17 PM

## 2016-10-26 ENCOUNTER — Ambulatory Visit (HOSPITAL_COMMUNITY)
Admission: RE | Admit: 2016-10-26 | Discharge: 2016-10-26 | Disposition: A | Discharge status: Home / Self Care | Payer: Medicare Other | Source: Ambulatory Visit | Attending: Cardiology | Admitting: Cardiology

## 2016-10-26 DIAGNOSIS — Z6841 Body Mass Index (BMI) 40.0 and over, adult: Secondary | ICD-10-CM | POA: Diagnosis not present

## 2016-10-26 DIAGNOSIS — E669 Obesity, unspecified: Secondary | ICD-10-CM | POA: Insufficient documentation

## 2016-10-26 DIAGNOSIS — G4733 Obstructive sleep apnea (adult) (pediatric): Secondary | ICD-10-CM | POA: Diagnosis not present

## 2016-10-26 DIAGNOSIS — I5022 Chronic systolic (congestive) heart failure: Secondary | ICD-10-CM | POA: Insufficient documentation

## 2016-10-26 DIAGNOSIS — Z79899 Other long term (current) drug therapy: Secondary | ICD-10-CM | POA: Insufficient documentation

## 2016-10-26 DIAGNOSIS — I4891 Unspecified atrial fibrillation: Secondary | ICD-10-CM | POA: Diagnosis not present

## 2016-10-26 NOTE — Progress Notes (Signed)
HF MD: Dr. Shirlee Latch  HPI:  70 yo Caucasian woman with h/o obesity, PAF, moderate OSA (cannot tolerate CPAP) and systolic HF.  She was admitted in 09/2013 with severe dyspnea for several days and was found to have atrial fibrillation with RVR and volume overload.  EF 20-25% on echo, diffuse hypokinesis.   She was cardioverted twice but both times went back into atrial fibrillation, the 2nd time on amiodarone.    She had an atrial fibrillation ablation in 12/14 that was successful.  TEE in 12/14 showed EF 30%.  Repeat echo in 4/15 showed EF up to 45%.  Most recent echo in 6/17 showed EF down to 25-30%.    Today she returns for pharmacist-led HF medication titration. At last HF clinic visit on 10/3, losartan was increased to 50 mg qam/25 mg qpm but her serum K was slightly elevated and she was told to reduce the dose back to 25 mg BID. She has been doing well since her last visit and recently joined a gym for seniors.      . Shortness of breath/dyspnea on exertion? No  . Orthopnea/PND? No . Edema? No  . Lightheadedness/dizziness? No . Daily weights at home? Yes - stable ~250 lb . Blood pressure/heart rate monitoring at home? Yes - HR ~58-68 bpm . Following low-sodium/fluid-restricted diet? Yes - makes her own soups without any added sodium  HF Medications: Carvedilol 12.5 mg PO BID Losartan 25 mg PO BID Spironolactone 25 mg PO daily  Precautions/Contraindications: Entresto 24-26 mg BID - intolerable lightheadedness  Has the patient been experiencing any side effects to the medications prescribed?  no  Does the patient have any problems obtaining medications due to transportation or finances?   no  Understanding of regimen: good Understanding of indications: good Potential of compliance: good Patient understands to avoid NSAIDs. Patient understands to avoid decongestants.    Pertinent Lab Values: . 10/17/16: Serum creatinine 1.37 (BL ~1.3-1.4), BUN 33, Potassium 4.7, Sodium 138,  BNP 73.6  Vital Signs: . Weight: 261 lb (dry weight: 261 lb) . Blood pressure: 110/78 mmHg . Heart rate: 77 bpm   Assessment: 1. Chronicsystolic CHF (EF 28-78%), due to NICM. NYHA class IIsymptoms.  - Volume status stable  - Continue current regimen of losartan 25 mg BID (will not increase 2/2 issues with dizziness and borderline hyperkalemia at increased doses), spironolactone 25 mg daily and carvedilol 12.5 mg BID  - Discussed increasing carvedilol but she was hesitant because her HR at home has been in the 50's recently  - Discussed the importance of lifestyle changes including exercise and diet (specifically discussed Mediterranean diet) and she was agreeable and excited to start her gym regimen  - Basic disease state pathophysiology, medication indication, mechanism and side effects reviewed at length with patient and he verbalized understanding 2. Atrial fibrillation  - Followed by Dr. Johney Frame  - Remains in NSR post ablation off AAD  - Continue apixaban 5 mg BID 3. OSA  - Doesn't tolerate CPAP  Plan: 1) Medication changes: Based on clinical presentation, vital signs and recent labs will continue current regimen  2) Labs: PRN 3) Follow-up: Dr. Shirlee Latch on 12/11   Tyler Deis. Bonnye Fava, PharmD, BCPS, CPP Clinical Pharmacist Pager: [phone] Phone: [phone] 10/26/2016 1:55 PM

## 2016-10-26 NOTE — Patient Instructions (Signed)
It was great to see you!  Please continue your current medication regimen.   Please keep your appointment with Dr. Shirlee Latch on 12/11.

## 2016-11-28 ENCOUNTER — Encounter (HOSPITAL_COMMUNITY): Payer: Medicare Other

## 2016-12-05 ENCOUNTER — Other Ambulatory Visit (HOSPITAL_COMMUNITY): Payer: Self-pay | Admitting: Cardiology

## 2016-12-27 ENCOUNTER — Other Ambulatory Visit (HOSPITAL_COMMUNITY): Payer: Self-pay | Admitting: Cardiology

## 2017-01-03 ENCOUNTER — Encounter (HOSPITAL_COMMUNITY): Payer: Medicare Other

## 2017-01-03 ENCOUNTER — Other Ambulatory Visit: Payer: Self-pay | Admitting: Internal Medicine

## 2017-01-27 ENCOUNTER — Encounter (HOSPITAL_COMMUNITY): Payer: Self-pay

## 2017-01-27 ENCOUNTER — Ambulatory Visit (HOSPITAL_COMMUNITY)
Admission: RE | Admit: 2017-01-27 | Discharge: 2017-01-27 | Disposition: A | Discharge status: Home / Self Care | Payer: Medicare Other | Source: Ambulatory Visit | Attending: Cardiology | Admitting: Cardiology

## 2017-01-27 VITALS — BP 154/102 | HR 78 | Wt 255.0 lb

## 2017-01-27 DIAGNOSIS — E669 Obesity, unspecified: Secondary | ICD-10-CM | POA: Insufficient documentation

## 2017-01-27 DIAGNOSIS — I4891 Unspecified atrial fibrillation: Secondary | ICD-10-CM | POA: Insufficient documentation

## 2017-01-27 DIAGNOSIS — N189 Chronic kidney disease, unspecified: Secondary | ICD-10-CM | POA: Diagnosis not present

## 2017-01-27 DIAGNOSIS — M1990 Unspecified osteoarthritis, unspecified site: Secondary | ICD-10-CM | POA: Diagnosis not present

## 2017-01-27 DIAGNOSIS — I5022 Chronic systolic (congestive) heart failure: Secondary | ICD-10-CM | POA: Diagnosis not present

## 2017-01-27 DIAGNOSIS — I428 Other cardiomyopathies: Secondary | ICD-10-CM | POA: Insufficient documentation

## 2017-01-27 DIAGNOSIS — G4733 Obstructive sleep apnea (adult) (pediatric): Secondary | ICD-10-CM | POA: Diagnosis not present

## 2017-01-27 DIAGNOSIS — K219 Gastro-esophageal reflux disease without esophagitis: Secondary | ICD-10-CM | POA: Diagnosis not present

## 2017-01-27 DIAGNOSIS — Z79899 Other long term (current) drug therapy: Secondary | ICD-10-CM | POA: Diagnosis not present

## 2017-01-27 DIAGNOSIS — Z7901 Long term (current) use of anticoagulants: Secondary | ICD-10-CM | POA: Diagnosis not present

## 2017-01-27 DIAGNOSIS — I480 Paroxysmal atrial fibrillation: Secondary | ICD-10-CM | POA: Diagnosis not present

## 2017-01-27 DIAGNOSIS — Z6841 Body Mass Index (BMI) 40.0 and over, adult: Secondary | ICD-10-CM | POA: Insufficient documentation

## 2017-01-27 LAB — BASIC METABOLIC PANEL
Anion gap: 9 (ref 5–15)
BUN: 25 mg/dL — ABNORMAL HIGH (ref 6–20)
CO2: 23 mmol/L (ref 22–32)
Calcium: 9.5 mg/dL (ref 8.9–10.3)
Chloride: 107 mmol/L (ref 101–111)
Creatinine, Ser: 1.47 mg/dL — ABNORMAL HIGH (ref 0.44–1.00)
GFR calc Af Amer: 41 mL/min — ABNORMAL LOW (ref >60)
GFR calc non Af Amer: 35 mL/min — ABNORMAL LOW (ref >60)
Glucose, Bld: 128 mg/dL — ABNORMAL HIGH (ref 65–99)
Potassium: 4.9 mmol/L (ref 3.5–5.1)
Sodium: 139 mmol/L (ref 135–145)

## 2017-01-27 LAB — CBC
HCT: 35.9 % — ABNORMAL LOW (ref 36.0–46.0)
Hemoglobin: 11.5 g/dL — ABNORMAL LOW (ref 12.0–15.0)
MCH: 27.4 pg (ref 26.0–34.0)
MCHC: 32 g/dL (ref 30.0–36.0)
MCV: 85.5 fL (ref 78.0–100.0)
Platelets: 360 10*3/uL (ref 150–400)
RBC: 4.2 MIL/uL (ref 3.87–5.11)
RDW: 13.8 % (ref 11.5–15.5)
WBC: 6.7 10*3/uL (ref 4.0–10.5)

## 2017-01-27 MED ORDER — LOSARTAN POTASSIUM 50 MG PO TABS: ORAL_TABLET | ORAL | 3 refills | Status: DC

## 2017-01-27 NOTE — Patient Instructions (Signed)
Labs today (will call for abnormal results, otherwise no news is good news)  Increase Losartan to 25 mg (0.5 Tablet) in the AM and 50 mg (1 Tablet) in the PM  Labs in 10 days  Follow up in 2 months along with an Echo.

## 2017-01-29 NOTE — Progress Notes (Signed)
Patient ID: Kelli Webster, female   DOB: [birth year], 71 y.o.   MRN: [id number] PCP: Dr. Collins Scotland EP: Dr Johney Frame HF MD: Dr Shirlee Latch.   71 yo woman with h/o obesity, PAF, moderate OSA (cannot tolerate CPAP) and systolic HF.  She was admitted in 09/2013 with severe dyspnea for several days and was found to have atrial fibrillation with RVR and volume overload.  EF 20-25% on echo, diffuse hypokinesis.   She was cardioverted twice but both times went back into atrial fibrillation, the 2nd time on amiodarone.    She had an atrial fibrillation ablation in 12/14 that was successful.  TEE in 12/14 showed EF 30%.  Repeat echo in 4/15 showed EF up to 45%.  Most recent echo in 6/17 showed EF down to 25-30%.   Today she returns for HF follow up.  Symptomatically stable.  No dyspnea with ADLs, able to climb steps.  Can use elliptical for 40 minutes.  No chest pain. She has not been using Lasix, weight down 6 lbs.  No palpitations.  She is in NSR today. She was tried on [place] after a prior visit and did not tolerate due to lightheadedness.  BP is high today but she was running late for her appointment and was anxious.  It has not been this high in the past.   Labs (10/14): K 4.5, creatinine 1.1, HIV negative, SPEP/UPEP negative, LDL 62, HDL 45 Labs (10/18/13): K 4.2, creatinine 1.5, digoxin 1.0 Labs (12/14): K 4.2, creatinine 1.4 Labs (1/15): K 4.2, creatinine 1.5, digoxin 0.7, LFTs and TSH normal Labs (4/15): K 5.2 creatinine 1.6, digoxin 1.0  Labs (11/15): K 5.1, creatinine 1.4 Labs 09/2015: K 4.6 Creatinine 1.53  Labs (8/17): K 4.8, creatinine 1.53, hgb 11.6 Labs (10/17): K 4.7, creatinine 1.37, BNP 74  ECG: NSR, poor RWP   PMH:  1. Obesity 2. GERD 3. OA 4. EGD negative in 10/14 5. ACEI cough 6. Atrial fibrillation: First diagnosed in 10/14.  DCCV to NSR in 09/2013 but atrial fibrillation recurred. Cardioversion 10/18/2013 successful on amiodarone but back in atrial fibrillation in 11/14.  Atrial  fibrillation ablation ([facility]) 12/14.  7. Cardiomyopathy: Echo (10/14) with EF 20-25%, diffuse HK.  LHC (10/14) showed no CAD.  TSH normal, HIV negative, SPEP/UPEP negative.  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  TEE (12/14) with EF 30%, diffuse hypokinesis, mild MR, mildly decreased RV systolic function.  Echo (4/15) with EF 45%, diffuse hypokinesis, mildly dilated LV, normal RV size and systolic function.  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 - Echo (6/17) with EF 25-30%, mild MR.  8. Sinus bradycardia 9. OSA: Cannot tolerate CPAP. 10. CKD  SH: Married, lives in [place], nonsmoker.   FH: No history of cardiomyopathy or premature CAD.   ROS: All systems reviewed and negative except as per HPI.   Current Outpatient Prescriptions  Medication Sig Dispense Refill  . carvedilol (COREG) 12.5 MG tablet TAKE 1 TABLET BY MOUTH TWICE DAILY WITH A MEAL 180 tablet 4  . ELIQUIS 5 MG TABS tablet TAKE 1 TABLET BY MOUTH TWO  TIMES DAILY 180 tablet 1  . famotidine (PEPCID) 20 MG tablet TAKE 1 TABLET BY MOUTH TWICE A DAY 60 tablet 0  . losartan (COZAAR) 50 MG tablet Take 25 mg (0.5 Tablet) in the AM and take 50 mg (1 Tablet) in the PM. 45 tablet 3  . Multiple Vitamins-Minerals (MULTIVITAMIN WITH MINERALS) tablet Take 1 tablet by mouth daily.    Marland Kitchen spironolactone (ALDACTONE) 25 MG tablet TAKE 1 TABLET (  25 MG TOTAL) BY MOUTH DAILY. 90 tablet 4   No current facility-administered medications for this encounter.     Vitals:   01/27/17 1345  BP: (!) 154/102  BP Location: Left Wrist  Patient Position: Sitting  Cuff Size: Normal  Pulse: 78  SpO2: 98%  Weight: 255 lb (115.7 kg)   General: NAD, obese Neck: JVD 7, no thyromegaly or thyroid nodule.  Lungs: Clear to auscultation bilaterally with normal respiratory effort. CV: Nondisplaced PMI.  Heart regular S1/S2, no S3/S4, no murmur.  No peripheral edema.  No carotid bruit.  Normal pedal pulses.  Abdomen: obese, Soft, nontender, no hepatosplenomegaly,  no distention.  Skin: Intact without lesions or rashes.  Neurologic: Alert and oriented x 3.  Psych: Normal affect. Extremities: No clubbing or cyanosis.   Assessment/Plan: 1. 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 Functionally doing ok. NYHA class II. - Continue current Coreg and spironolactone.  - Not able to tolerate Entresto due to lightheadedness.  Increase losartan to 25 qam/50 qpm.  BMET today and repeat in 2 wks.  - Volume stable, I do not think she needs Lasix.  - Repeat echo in 2 months at followup.  If EF remains low, can consider ICD.  She has narrow QRS so not CRT candidate.   - She says she cannot have a cardiac MRI due to claustrophobia.  2. Atrial fibrillation: Patient is in NSR today. She is off amiodarone. - Continue apixaban, check CBC.  3. OSA: Does tolerate CPAP.  Followup in 2 months.  [name] 01/29/2017

## 2017-01-30 ENCOUNTER — Telehealth (HOSPITAL_COMMUNITY): Payer: Self-pay | Admitting: *Deleted

## 2017-01-30 MED ORDER — LOSARTAN POTASSIUM 25 MG PO TABS: 25.0000 mg | ORAL_TABLET | Freq: Two times a day (BID) | ORAL | 2 refills | Status: DC

## 2017-01-30 NOTE — Telephone Encounter (Signed)
Notes Recorded by Modesta Messing, CMA on 01/30/2017 at 2:46 PM EST Pt aware and agreeable ------  Notes Recorded by Suezanne Cheshire, RN on 01/27/2017 at 3:51 PM EST Called and left VM to call us back. ------  Notes Recorded by Laurey Morale, MD on 01/27/2017 at 3:35 PM EST Stable but high normal K. Please call and have her keep losartan at 25 mg bid.    Ref Range & Units 3d ago (01/27/17) 38mo ago (10/17/16) 25mo ago (10/17/16)   Sodium 135 - 145 mmol/L 139  138  142CM    Potassium 3.5 - 5.1 mmol/L 4.9  4.7  4.2CM    Chloride 101 - 111 mmol/L 107  107  109CM    CO2 22 - 32 mmol/L 23  23  25CM    Glucose, Bld 65 - 99 mg/dL 660   630   160FU     BUN 6 - 20 mg/dL 25   33   93AT    Creatinine, Ser 0.44 - 1.00 mg/dL 5.57   3.22   0.25KY    Calcium 8.9 - 10.3 mg/dL 9.5  9.2  10.0CM    GFR calc non Af Amer >60 mL/min 35   38   >60CM    GFR calc Af Amer >60 mL/min 41   44CM   >

## 2017-01-30 NOTE — Telephone Encounter (Signed)
-----   Message from Laurey Morale, MD sent at 01/27/2017  3:35 PM EST ----- Stable but high normal K.  Please call and have her keep losartan at 25 mg bid.

## 2017-02-07 ENCOUNTER — Other Ambulatory Visit (HOSPITAL_COMMUNITY): Payer: Medicare Other

## 2017-04-11 ENCOUNTER — Other Ambulatory Visit (HOSPITAL_COMMUNITY): Payer: Self-pay | Admitting: Cardiology

## 2017-04-11 ENCOUNTER — Other Ambulatory Visit (HOSPITAL_COMMUNITY): Payer: Medicare Other

## 2017-04-11 ENCOUNTER — Encounter (HOSPITAL_COMMUNITY): Payer: Medicare Other

## 2017-04-11 MED ORDER — LOSARTAN POTASSIUM 25 MG PO TABS: 25.0000 mg | ORAL_TABLET | Freq: Two times a day (BID) | ORAL | 2 refills | Status: DC

## 2017-04-13 ENCOUNTER — Other Ambulatory Visit: Payer: Self-pay | Admitting: Cardiology

## 2017-05-18 ENCOUNTER — Encounter (HOSPITAL_COMMUNITY): Payer: Medicare Other

## 2017-05-18 ENCOUNTER — Other Ambulatory Visit (HOSPITAL_COMMUNITY): Payer: Medicare Other

## 2017-05-28 ENCOUNTER — Other Ambulatory Visit: Payer: Self-pay | Admitting: Internal Medicine

## 2017-05-31 ENCOUNTER — Other Ambulatory Visit (HOSPITAL_COMMUNITY): Payer: Self-pay | Admitting: Cardiology

## 2017-06-02 ENCOUNTER — Other Ambulatory Visit (HOSPITAL_COMMUNITY): Payer: Self-pay | Admitting: *Deleted

## 2017-06-02 MED ORDER — FAMOTIDINE 20 MG PO TABS: 20.0000 mg | ORAL_TABLET | Freq: Two times a day (BID) | ORAL | 3 refills | Status: DC

## 2017-07-13 ENCOUNTER — Other Ambulatory Visit (HOSPITAL_COMMUNITY): Payer: Self-pay | Admitting: Cardiology

## 2017-07-18 ENCOUNTER — Other Ambulatory Visit: Payer: Self-pay

## 2017-07-18 ENCOUNTER — Ambulatory Visit (HOSPITAL_BASED_OUTPATIENT_CLINIC_OR_DEPARTMENT_OTHER)
Admission: RE | Admit: 2017-07-18 | Discharge: 2017-07-18 | Disposition: A | Discharge status: Home / Self Care | Payer: Medicare Other | Source: Ambulatory Visit | Attending: Cardiology | Admitting: Cardiology

## 2017-07-18 ENCOUNTER — Encounter (HOSPITAL_COMMUNITY): Payer: Self-pay | Admitting: Cardiology

## 2017-07-18 ENCOUNTER — Ambulatory Visit (HOSPITAL_COMMUNITY)
Admission: RE | Admit: 2017-07-18 | Discharge: 2017-07-18 | Disposition: A | Discharge status: Home / Self Care | Payer: Medicare Other | Source: Ambulatory Visit | Attending: Cardiology | Admitting: Cardiology

## 2017-07-18 VITALS — BP 128/60 | HR 71 | Wt 256.0 lb

## 2017-07-18 DIAGNOSIS — K219 Gastro-esophageal reflux disease without esophagitis: Secondary | ICD-10-CM | POA: Insufficient documentation

## 2017-07-18 DIAGNOSIS — G4733 Obstructive sleep apnea (adult) (pediatric): Secondary | ICD-10-CM | POA: Diagnosis not present

## 2017-07-18 DIAGNOSIS — I428 Other cardiomyopathies: Secondary | ICD-10-CM | POA: Insufficient documentation

## 2017-07-18 DIAGNOSIS — I517 Cardiomegaly: Secondary | ICD-10-CM | POA: Insufficient documentation

## 2017-07-18 DIAGNOSIS — Z6841 Body Mass Index (BMI) 40.0 and over, adult: Secondary | ICD-10-CM | POA: Insufficient documentation

## 2017-07-18 DIAGNOSIS — I480 Paroxysmal atrial fibrillation: Secondary | ICD-10-CM | POA: Diagnosis not present

## 2017-07-18 DIAGNOSIS — Z79899 Other long term (current) drug therapy: Secondary | ICD-10-CM | POA: Insufficient documentation

## 2017-07-18 DIAGNOSIS — I5022 Chronic systolic (congestive) heart failure: Secondary | ICD-10-CM | POA: Diagnosis not present

## 2017-07-18 DIAGNOSIS — I5189 Other ill-defined heart diseases: Secondary | ICD-10-CM | POA: Diagnosis not present

## 2017-07-18 DIAGNOSIS — Z7901 Long term (current) use of anticoagulants: Secondary | ICD-10-CM | POA: Insufficient documentation

## 2017-07-18 DIAGNOSIS — I340 Nonrheumatic mitral (valve) insufficiency: Secondary | ICD-10-CM | POA: Insufficient documentation

## 2017-07-18 DIAGNOSIS — N189 Chronic kidney disease, unspecified: Secondary | ICD-10-CM | POA: Diagnosis not present

## 2017-07-18 DIAGNOSIS — I509 Heart failure, unspecified: Secondary | ICD-10-CM | POA: Diagnosis present

## 2017-07-18 DIAGNOSIS — I493 Ventricular premature depolarization: Secondary | ICD-10-CM | POA: Diagnosis not present

## 2017-07-18 LAB — CBC
HCT: 36.3 % (ref 36.0–46.0)
Hemoglobin: 11.6 g/dL — ABNORMAL LOW (ref 12.0–15.0)
MCH: 27.8 pg (ref 26.0–34.0)
MCHC: 32 g/dL (ref 30.0–36.0)
MCV: 86.8 fL (ref 78.0–100.0)
Platelets: 358 10*3/uL (ref 150–400)
RBC: 4.18 MIL/uL (ref 3.87–5.11)
RDW: 13.5 % (ref 11.5–15.5)
WBC: 5.7 10*3/uL (ref 4.0–10.5)

## 2017-07-18 LAB — BASIC METABOLIC PANEL
Anion gap: 7 (ref 5–15)
BUN: 32 mg/dL — ABNORMAL HIGH (ref 6–20)
CO2: 25 mmol/L (ref 22–32)
Calcium: 9.1 mg/dL (ref 8.9–10.3)
Chloride: 104 mmol/L (ref 101–111)
Creatinine, Ser: 1.5 mg/dL — ABNORMAL HIGH (ref 0.44–1.00)
GFR calc Af Amer: 40 mL/min — ABNORMAL LOW (ref >60)
GFR calc non Af Amer: 34 mL/min — ABNORMAL LOW (ref >60)
Glucose, Bld: 150 mg/dL — ABNORMAL HIGH (ref 65–99)
Potassium: 4.5 mmol/L (ref 3.5–5.1)
Sodium: 136 mmol/L (ref 135–145)

## 2017-07-18 NOTE — Progress Notes (Signed)
  Echocardiogram 2D Echocardiogram has been performed.  Kelli Webster 07/18/2017, 3:00 PM

## 2017-07-18 NOTE — Patient Instructions (Signed)
Labs today (will call for abnormal results, otherwise no news is good news)  Follow up in 6 months, please call us in January to schedule your appointment.  Call [phone] and press option 3.

## 2017-07-18 NOTE — Progress Notes (Signed)
Patient ID: Kelli Webster, female   DOB: [birth year], 71 y.o.   MRN: [id number] PCP: Dr. Andrey Campanile EP: Dr Johney Frame HF MD: Dr Shirlee Latch.   71 yo woman with h/o obesity, PAF, moderate OSA (cannot tolerate CPAP) and systolic HF.  She was admitted in 09/2013 with severe dyspnea for several days and was found to have atrial fibrillation with RVR and volume overload.  EF 20-25% on echo, diffuse hypokinesis.   She was cardioverted twice but both times went back into atrial fibrillation, the 2nd time on amiodarone.    She had an atrial fibrillation ablation in 12/14 that was successful.  TEE in 12/14 showed EF 30%.  Repeat echo in 4/15 showed EF up to 45%.  Most recent echo in 6/17 showed EF down to 25-30%.   Returns today for HF follow up. Under a lot of stress lately, her mother was diagnosed with lymphoma. Weight stable at home, going to the gym 3 times a week now. Walking on treadmill without SOB. Denies chest pain, orthopnea, PND. Taking all medications, not following a low salt diet. Drinking less than 2L a day.   Dr. Shirlee Latch reviewed today's echo: EF improved to 45-50%.    Labs (10/14): K 4.5, creatinine 1.1, HIV negative, SPEP/UPEP negative, LDL 62, HDL 45 Labs (10/18/13): K 4.2, creatinine 1.5, digoxin 1.0 Labs (12/14): K 4.2, creatinine 1.4 Labs (1/15): K 4.2, creatinine 1.5, digoxin 0.7, LFTs and TSH normal Labs (4/15): K 5.2 creatinine 1.6, digoxin 1.0  Labs (11/15): K 5.1, creatinine 1.4 Labs 09/2015: K 4.6 Creatinine 1.53  Labs (8/17): K 4.8, creatinine 1.53, hgb 11.6 Labs (10/17): K 4.7, creatinine 1.37, BNP 74 Labs (2/18): K 4.9, creatinine 1.47, hgb 11.5  ECG (personally reviewed): NSR, poor RWP   PMH:  1. Obesity 2. GERD 3. OA 4. EGD negative in 10/14 5. ACEI cough 6. Atrial fibrillation: First diagnosed in 10/14.  DCCV to NSR in 09/2013 but atrial fibrillation recurred. Cardioversion 10/18/2013 successful on amiodarone but back in atrial fibrillation in 11/14.  Atrial fibrillation  ablation ([facility]) 12/14.  7. Cardiomyopathy: Echo (10/14) with EF 20-25%, diffuse HK.  LHC (10/14) showed no CAD.  TSH normal, HIV negative, SPEP/UPEP negative.  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  TEE (12/14) with EF 30%, diffuse hypokinesis, mild MR, mildly decreased RV systolic function.  Echo (4/15) with EF 45%, diffuse hypokinesis, mildly dilated LV, normal RV size and systolic function.  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 - Echo (6/17) with EF 25-30%, mild MR.  - Echo (6/18) with EF 45-50%, mildly dilated LV, mild MR.  8. Sinus bradycardia 9. OSA: Cannot tolerate CPAP. 10. CKD  SH: Married, lives in [place], nonsmoker.   FH: No history of cardiomyopathy or premature CAD.   ROS: All systems reviewed and negative except as per HPI.   Current Outpatient Prescriptions  Medication Sig Dispense Refill  . carvedilol (COREG) 12.5 MG tablet TAKE 1 TABLET BY MOUTH TWICE DAILY WITH A MEAL 180 tablet 3  . ELIQUIS 5 MG TABS tablet TAKE 1 TABLET BY MOUTH TWO  TIMES DAILY 180 tablet 3  . famotidine (PEPCID) 20 MG tablet Take 1 tablet (20 mg total) by mouth 2 (two) times daily. 60 tablet 3  . losartan (COZAAR) 25 MG tablet Take 1 tablet (25 mg total) by mouth 2 (two) times daily. 180 tablet 2  . Multiple Vitamins-Minerals (MULTIVITAMIN WITH MINERALS) tablet Take 1 tablet by mouth daily.    Marland Kitchen spironolactone (ALDACTONE) 25 MG tablet TAKE 1 TABLET (25 MG  TOTAL) BY MOUTH DAILY. 90 tablet 3   No current facility-administered medications for this encounter.     Vitals:   07/18/17 1458  BP: 128/60  Pulse: 71  SpO2: 98%  Weight: 256 lb (116.1 kg)   General: Female, NAD. Walked into clinic without difficulty.  Neck: Supple, hard to assess JVD due to body habitus.  Lungs: Clear bilaterally in upper lobes, diminished in bases.  CV: Non displaced PMI, heart regular rate and rhythm. No S3. No peripheral edema.  Abdomen: Obese, soft, non tender. No distention.  Skin: Intact without lesions or  rashes.  Neurologic:Alert and oriented x 3.  Psych: Normal affect. Extremities: No clubbing or cyanosis.   Assessment/Plan: 1. 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 Echo 05/2016 EF 25-30%, most recent Echo today EF 45-50%,  improved.  - NYHA II - Volume status stable without Lasix.  - She was unable to tolerate Entresto due to lightheadedness.  - Continue losartan 25 mg BID. We have tried to increase this in the past to 25 mg in the am and 50mg  in the pm but she did not tolerate it.  BMET today.  - Continue current Coreg and spironolactone.  - She does not tolerate medication titrations well historically. Will keep meds the same.   2. Atrial fibrillation - NSR today - No longer on Amiodarone.  - Continue apixaban. CBC today.  - Denies melena and hematochezia.   3. OSA: She has been diagnosed in the past but cannot tolerate CPAP.   Follow up in 6 months.   Little Ishikawa 07/18/2017   Patient seen with NP, agree with the above note.  I reviewed her echo today, EF improved somewhat to 45-50%. She is out of ICD range .   She has tolerated meds poorly historically.  So far doing ok on Coreg 12.5 mg bid and losartan 25 mg bid.  I will not titrate.  BMET today.   No further symptomatic atrial flutter.  Continue Eliquis, check CBC.   Followup in 6 months.   [name] 07/19/2017

## 2017-09-11 ENCOUNTER — Ambulatory Visit (INDEPENDENT_AMBULATORY_CARE_PROVIDER_SITE_OTHER): Payer: Medicare Other | Admitting: Internal Medicine

## 2017-09-11 ENCOUNTER — Encounter: Payer: Self-pay | Admitting: Internal Medicine

## 2017-09-11 VITALS — BP 140/80 | HR 77 | Ht 63.0 in | Wt 259.0 lb

## 2017-09-11 DIAGNOSIS — I5022 Chronic systolic (congestive) heart failure: Secondary | ICD-10-CM

## 2017-09-11 DIAGNOSIS — I480 Paroxysmal atrial fibrillation: Secondary | ICD-10-CM

## 2017-09-11 NOTE — Progress Notes (Signed)
PCP: Barbie Banner, MD Primary Cardiologist: Dr Shirlee Latch Primary EP: Dr Johney Frame  Kelli Webster is a 71 y.o. female who presents today for routine electrophysiology followup.  Since last being seen in our clinic, the patient reports doing very well.  Today, she denies symptoms of palpitations, chest pain, shortness of breath,  lower extremity edema, dizziness, presyncope, or syncope.  The patient is otherwise without complaint today.   Past Medical History:  Diagnosis Date  . Chronic systolic heart failure (HCC)    a. ECHO (09/2013) EF 20-25% diff HK, mild/mod MR, LA mod dil, RV mildly dil, RA mildly dil b. R/LHC (09/26/13) AO 133/88, LV 137/17, Lmain no dz, LAD 30% prox, LCX mild luminal irreg; RCA mild luminal irreg  . GERD (gastroesophageal reflux disease)    a. 09/2013 endoscopy nl  . Morbid obesity (HCC)   . Osteoarthritis   . Persistent atrial fibrillation (HCC) Chads2vascscore of at least 4(02/12/15)   a. Dx 09/2013 b. s/p PVI by Dr Johney Frame 11-26-2013   Past Surgical History:  Procedure Laterality Date  . ABLATION  11-26-2013   PVI by Dr Johney Frame  . arthroscopy       left knee    . ATRIAL FIBRILLATION ABLATION N/A 11/26/2013   Procedure: ATRIAL FIBRILLATION ABLATION;  Surgeon: Gardiner Rhyme, MD;  Location: [facility] CATH LAB;  Service: Cardiovascular;  Laterality: N/A;  . CARDIOVERSION N/A 10/01/2013   Procedure: CARDIOVERSION;  Surgeon: Laurey Morale, MD;  Location[facility] ENDOSCOPY;  Service: Cardiovascular;  Laterality: N/A;  . CARDIOVERSION N/A 10/17/2013   Procedure: CARDIOVERSION;  Surgeon: Laurey Morale, MD;  Location: [facility] ENDOSCOPY;  Service: Cardiovascular;  Laterality: N/A;  . ESOPHAGOGASTRODUODENOSCOPY N/A 09/25/2013   Procedure: ESOPHAGOGASTRODUODENOSCOPY (EGD);  Surgeon: Graylin Shiver, MD;  Location: [facility] ENDOSCOPY;  Service: Endoscopy;  Laterality: N/A;  . fx left wrist     open reduction & internal fixaction  . LEFT HEART CATHETERIZATION WITH CORONARY ANGIOGRAM  09/26/2013   Procedure: LEFT HEART CATHETERIZATION WITH CORONARY ANGIOGRAM;  Surgeon: Laurey Morale, MD;  Location: [facility] CATH LAB;  Service: Cardiovascular;;  . TEE WITHOUT CARDIOVERSION N/A 10/01/2013   Procedure: TRANSESOPHAGEAL ECHOCARDIOGRAM (TEE);  Surgeon: Laurey Morale, MD;  Location: [facility] ENDOSCOPY;  Service: Cardiovascular;  Laterality: N/A;  . TEE WITHOUT CARDIOVERSION N/A 11/26/2013   Procedure: TRANSESOPHAGEAL ECHOCARDIOGRAM (TEE);  Surgeon: Laurey Morale, MD;  Location: [facility] ENDOSCOPY;  Service: Cardiovascular;  Laterality: N/A;    ROS- all systems are reviewed and negatives except as per HPI above  Current Outpatient Prescriptions  Medication Sig Dispense Refill  . carvedilol (COREG) 12.5 MG tablet TAKE 1 TABLET BY MOUTH TWICE DAILY WITH A MEAL 180 tablet 3  . ELIQUIS 5 MG TABS tablet TAKE 1 TABLET BY MOUTH TWO  TIMES DAILY 180 tablet 3  . famotidine (PEPCID) 20 MG tablet Take 1 tablet (20 mg total) by mouth 2 (two) times daily. 60 tablet 3  . losartan (COZAAR) 25 MG tablet Take 1 tablet (25 mg total) by mouth 2 (two) times daily. 180 tablet 2  . Multiple Vitamins-Minerals (MULTIVITAMIN WITH MINERALS) tablet Take 1 tablet by mouth daily.    Marland Kitchen spironolactone (ALDACTONE) 25 MG tablet TAKE 1 TABLET (25 MG TOTAL) BY MOUTH DAILY. 90 tablet 3   No current facility-administered medications for this visit.     Physical Exam: Vitals:   09/11/17 0958  BP: 140/80  Pulse: 77  SpO2: 98%  Weight: 259 lb (117.5 kg)  Height: 5\' 3"  (1.6  m)    GEN- The patient is overweight appearing, alert and oriented x 3 today.   Head- normocephalic, atraumatic Eyes-  Sclera clear, conjunctiva pink Ears- hearing intact Oropharynx- clear Lungs- Clear to ausculation bilaterally, normal work of breathing Heart- Regular rate and rhythm, no murmurs, rubs or gallops, PMI not laterally displaced GI- soft, NT, ND, + BS Extremities- no clubbing, cyanosis, or edema  EKG tracing ordered today is personally reviewed  and shows sinus rhythm with PACs, LAD  Assessment and Plan:  1. Paroxysmal atrial fibrillation Well controlled off AAD therapy post ablation We discussed lifestyle modification again today On eliquis for chads2vasc score of at least 3  2. Obesity Body mass index is 45.88 kg/m. He has lost 5 lbs in the last year! Weight loss again advised today  3. Nonischemic CM/ chronic systolic dysfunction Stable (EF improved to 45-50%) No change required today  Return to see me in a year  Hillis Range MD, [facility] 09/11/2017 10:19 AM

## 2017-09-11 NOTE — Patient Instructions (Signed)

## 2017-10-19 ENCOUNTER — Encounter: Payer: Self-pay | Admitting: Internal Medicine

## 2017-10-19 NOTE — Telephone Encounter (Signed)
New Message    Patient c/o Palpitations:  High priority if patient c/o lightheadedness, shortness of breath, or chest pain  1) How long have you had palpitations/irregular HR/ Afib?  Since yesterday Are you having the symptoms now? Feels like heart is skipping a few beats   2) Are you currently experiencing lightheadedness, SOB or CP? no  3) Do you have a history of afib (atrial fibrillation) or irregular heart rhythm? yes  4) Have you checked your BP or HR? (document readings if available):   No   5) Are you experiencing any other symptoms? no

## 2017-10-19 NOTE — Telephone Encounter (Signed)
Patient calling and states that she has noticed that she has been having episodes of her heart skipping beats for the past 10 days or so. She states that she has been under a lot of stress lately and that she knows that she has been anxious and thinks that this has contributed to her symptoms. She denies having any CP, SOB, lightheadedness, dizziness, or any other symptoms. She states that she does not check her BP, but her HR has been 60-80s. She states that she never feels like her heart is racing. She states that she goes to the gym 3 days a week and she feels like her heart regulates after exercise. Patient states that she is taking all of her meds as directed including carvedilol 12.5 mg BID. Patient is wanting Dr. Johney Frame to be aware and to know if she should wait and see if it resolves or if she should have something PRN to take. Made patient aware that the information would be forwarded to Dr. Johney Frame for review.

## 2017-10-22 NOTE — Telephone Encounter (Signed)
Please follow up with patient.

## 2017-10-23 ENCOUNTER — Telehealth: Payer: Self-pay | Admitting: Internal Medicine

## 2017-10-23 NOTE — Telephone Encounter (Signed)
This encounter was created in error - please disregard.

## 2017-10-23 NOTE — Telephone Encounter (Signed)
Spoke with patient and then discussed with Dr. Johney Frame.  Was told to inform patient that things seem okay and that she should follow up with Rudi Coco in the Afib clinic if you continue to have symptoms.  [phone].

## 2017-10-23 NOTE — Telephone Encounter (Signed)
Editor: Daleen Bo I, RN (Registered Nurse)    Patient calling and states that she has noticed that she has been having episodes of her heart skipping beats for the past 10 days or so. She states that she has been under a lot of stress lately and that she knows that she has been anxious and thinks that this has contributed to her symptoms. She denies having any CP, SOB, lightheadedness, dizziness, or any other symptoms. She states that she does not check her BP, but her HR has been 60-80s. She states that she never feels like her heart is racing. She states that she goes to the gym 3 days a week and she feels like her heart regulates after exercise. Patient states that she is taking all of her meds as directed including carvedilol 12.5 mg BID. Patient is wanting Dr. Johney Frame to be aware and to know if she should wait and see if it resolves or if she should have something PRN to take. Made patient aware that the information would be forwarded to Dr. Johney Frame for review.     Telephone Encounter by Berle Mull at 10/19/2017 9:35 AM                 Editor: Berle Mull    New Message    Patient c/o Palpitations:  High priority if patient c/o lightheadedness, shortness of breath, or chest pain  1. How long have you had palpitations/irregular HR/ Afib?  Since yesterday Are you having the symptoms now? Feels like heart is skipping a few beats   2. Are you currently experiencing lightheadedness, SOB or CP? no  3. Do you have a history of afib (atrial fibrillation) or irregular heart rhythm? yes  4. Have you checked your BP or HR? (document readings if available):   No   5. Are you experiencing any other symptoms? no

## 2017-10-23 NOTE — Telephone Encounter (Signed)
Spoke with patient and she verbalized understanding.

## 2017-10-23 NOTE — Telephone Encounter (Signed)
New message    Patient called last Thursday, calling back today. States she still feels she is having some irregular HR. States she is stressed not sure if it is anxiety. APlease call  Patient c/o Palpitations:  High priority if patient c/o lightheadedness, shortness of breath, or chest pain  1) How long have you had palpitations/irregular HR/ Afib? Are you having the symptoms now? Afib/ irregular feeling for about 2 weeks  2) Are you currently experiencing lightheadedness, SOB or CP? Some shortness, not continual  3) Do you have a history of afib (atrial fibrillation) or irregular heart rhythm? yes  4) Have you checked your BP or HR? (document readings if available): have not checked today  5) Are you experiencing any other symptoms? No

## 2017-10-23 NOTE — Telephone Encounter (Signed)
[place], [place] 1 hour ago (10:24 AM)     New message    Patient called last Thursday, calling back today. States she still feels she is having some irregular HR. States she is stressed not sure if it is anxiety. APlease call  Patient c/o Palpitations:  High priority if patient c/o lightheadedness, shortness of breath, or chest pain  1. How long have you had palpitations/irregular HR/ Afib? Are you having the symptoms now? Afib/ irregular feeling for about 2 weeks  2. Are you currently experiencing lightheadedness, SOB or CP? Some shortness, not continual  3. Do you have a history of afib (atrial fibrillation) or irregular heart rhythm? yes  4. Have you checked your BP or HR? (document readings if available): have not checked today  5. Are you experiencing any other symptoms? No

## 2017-11-16 ENCOUNTER — Telehealth (HOSPITAL_COMMUNITY): Payer: Self-pay | Admitting: *Deleted

## 2017-11-16 NOTE — Telephone Encounter (Signed)
Pt called in stating she talked several weeks ago with Dr. Jenel Lucks office and was told to call clinic if afib symptoms continued. Pt states she has had 2 more episodes of afib since speaking to RN but she currently has an URI. Talked on the phone for extended period with patient HR is controlled in AF so she would like to wait until URI is over and see what her rhythm is doing at this point. Pt did not realize it can be normal to have intermittent AF with stress, illness etc after ablation. Pt was appreciative of advice and will call back after illness is over if symptoms persist.

## 2018-02-23 ENCOUNTER — Other Ambulatory Visit: Payer: Self-pay | Admitting: *Deleted

## 2018-02-26 ENCOUNTER — Other Ambulatory Visit (HOSPITAL_COMMUNITY): Payer: Self-pay | Admitting: *Deleted

## 2018-02-26 MED ORDER — SPIRONOLACTONE 25 MG PO TABS: ORAL_TABLET | ORAL | 0 refills | Status: DC

## 2018-02-27 ENCOUNTER — Telehealth (HOSPITAL_COMMUNITY): Payer: Self-pay | Admitting: Cardiology

## 2018-02-27 NOTE — Telephone Encounter (Signed)
Called and left message for pt to call back.  Pt needs f/u appt with Dr. Shirlee Latch for med refills per staff message from Irineo Axon, RN.  Will send appt info to Susie once pt calls back to schedule.

## 2018-02-28 ENCOUNTER — Telehealth (HOSPITAL_COMMUNITY): Payer: Self-pay | Admitting: Cardiology

## 2018-02-28 NOTE — Telephone Encounter (Signed)
Spoke with patient regarding scheduling f/u appt with Dr. Shirlee Latch (appt was due 12/2017).  Pt states she has enough medicine to last 2 months.  She said she does not have her calendar in front of her and will look at her schedule and call back later today to schedule her f/u appt.

## 2018-03-02 ENCOUNTER — Telehealth (HOSPITAL_COMMUNITY): Payer: Self-pay | Admitting: Cardiology

## 2018-03-02 ENCOUNTER — Encounter (HOSPITAL_COMMUNITY): Payer: Self-pay | Admitting: Cardiology

## 2018-03-02 NOTE — Telephone Encounter (Signed)
Appt reminder letter mailed to pt.

## 2018-04-03 ENCOUNTER — Other Ambulatory Visit: Payer: Self-pay | Admitting: Cardiology

## 2018-04-19 ENCOUNTER — Other Ambulatory Visit (HOSPITAL_COMMUNITY): Payer: Self-pay

## 2018-04-19 MED ORDER — APIXABAN 5 MG PO TABS: 5.0000 mg | ORAL_TABLET | Freq: Two times a day (BID) | ORAL | 3 refills | Status: DC

## 2018-04-26 ENCOUNTER — Other Ambulatory Visit (HOSPITAL_COMMUNITY): Payer: Self-pay | Admitting: Cardiology

## 2018-05-15 ENCOUNTER — Encounter (HOSPITAL_COMMUNITY): Payer: Medicare Other | Admitting: Cardiology

## 2018-07-04 ENCOUNTER — Telehealth (HOSPITAL_COMMUNITY): Payer: Self-pay | Admitting: Cardiology

## 2018-07-04 ENCOUNTER — Ambulatory Visit (HOSPITAL_COMMUNITY)
Admission: RE | Admit: 2018-07-04 | Discharge: 2018-07-04 | Disposition: A | Discharge status: Home / Self Care | Payer: Medicare Other | Source: Ambulatory Visit | Attending: Cardiology | Admitting: Cardiology

## 2018-07-04 ENCOUNTER — Encounter (HOSPITAL_COMMUNITY): Payer: Self-pay | Admitting: Cardiology

## 2018-07-04 VITALS — BP 168/70 | HR 70 | Wt 256.8 lb

## 2018-07-04 DIAGNOSIS — I4891 Unspecified atrial fibrillation: Secondary | ICD-10-CM | POA: Diagnosis not present

## 2018-07-04 DIAGNOSIS — N189 Chronic kidney disease, unspecified: Secondary | ICD-10-CM | POA: Diagnosis not present

## 2018-07-04 DIAGNOSIS — G4733 Obstructive sleep apnea (adult) (pediatric): Secondary | ICD-10-CM | POA: Diagnosis not present

## 2018-07-04 DIAGNOSIS — Z6841 Body Mass Index (BMI) 40.0 and over, adult: Secondary | ICD-10-CM | POA: Diagnosis not present

## 2018-07-04 DIAGNOSIS — M1990 Unspecified osteoarthritis, unspecified site: Secondary | ICD-10-CM | POA: Diagnosis not present

## 2018-07-04 DIAGNOSIS — K219 Gastro-esophageal reflux disease without esophagitis: Secondary | ICD-10-CM | POA: Insufficient documentation

## 2018-07-04 DIAGNOSIS — I480 Paroxysmal atrial fibrillation: Secondary | ICD-10-CM | POA: Diagnosis not present

## 2018-07-04 DIAGNOSIS — Z7901 Long term (current) use of anticoagulants: Secondary | ICD-10-CM | POA: Diagnosis not present

## 2018-07-04 DIAGNOSIS — Z79899 Other long term (current) drug therapy: Secondary | ICD-10-CM | POA: Diagnosis not present

## 2018-07-04 DIAGNOSIS — E669 Obesity, unspecified: Secondary | ICD-10-CM | POA: Insufficient documentation

## 2018-07-04 DIAGNOSIS — I5022 Chronic systolic (congestive) heart failure: Secondary | ICD-10-CM | POA: Diagnosis not present

## 2018-07-04 DIAGNOSIS — I428 Other cardiomyopathies: Secondary | ICD-10-CM | POA: Diagnosis not present

## 2018-07-04 LAB — BASIC METABOLIC PANEL
Anion gap: 9 (ref 5–15)
BUN: 28 mg/dL — ABNORMAL HIGH (ref 8–23)
CO2: 26 mmol/L (ref 22–32)
Calcium: 9.4 mg/dL (ref 8.9–10.3)
Chloride: 104 mmol/L (ref 98–111)
Creatinine, Ser: 1.5 mg/dL — ABNORMAL HIGH (ref 0.44–1.00)
GFR calc Af Amer: 39 mL/min — ABNORMAL LOW (ref >60)
GFR calc non Af Amer: 34 mL/min — ABNORMAL LOW (ref >60)
Glucose, Bld: 130 mg/dL — ABNORMAL HIGH (ref 70–99)
Potassium: 4.8 mmol/L (ref 3.5–5.1)
Sodium: 139 mmol/L (ref 135–145)

## 2018-07-04 LAB — CBC
HCT: 38.1 % (ref 36.0–46.0)
Hemoglobin: 12.4 g/dL (ref 12.0–15.0)
MCH: 29.4 pg (ref 26.0–34.0)
MCHC: 32.5 g/dL (ref 30.0–36.0)
MCV: 90.3 fL (ref 78.0–100.0)
Platelets: 368 10*3/uL (ref 150–400)
RBC: 4.22 MIL/uL (ref 3.87–5.11)
RDW: 12.9 % (ref 11.5–15.5)
WBC: 6.3 10*3/uL (ref 4.0–10.5)

## 2018-07-04 NOTE — Patient Instructions (Signed)
Labs today  Your physician has requested that you have an echocardiogram. Echocardiography is a painless test that uses sound waves to create images of your heart. It provides your doctor with information about the size and shape of your heart and how well your heart's chambers and valves are working. This procedure takes approximately one hour. There are no restrictions for this procedure.  THIS WILL BE DONE AT [facility], THEY WILL CALL YOU TO SCHEDULE, THEY ARE LOCATED AT: [address] [place], [place] [address]  We will contact you in 6 months to schedule your next appointment.

## 2018-07-04 NOTE — Progress Notes (Signed)
Patient ID: Britnay Magnussen, female   DOB: [birth year], 72 y.o.   MRN: [id number] PCP: Dr. Andrey Campanile EP: Dr Johney Frame HF MD: Dr Shirlee Latch.   71 y.o. woman with h/o obesity, PAF, moderate OSA (cannot tolerate CPAP) and systolic HF.  She was admitted in 09/2013 with severe dyspnea for several days and was found to have atrial fibrillation with RVR and volume overload.  EF 20-25% on echo, diffuse hypokinesis.   She was cardioverted twice but both times went back into atrial fibrillation, the 2nd time on amiodarone.    She had an atrial fibrillation ablation in 12/14 that was successful.  TEE in 12/14 showed EF 30%.  Repeat echo in 4/15 showed EF up to 45%.  Echo in 6/17 showed EF down to 25-30%.  Echo in 6/18 showed EF back up to 45-50%.   She returns for followup of CHF.  She seems to be doing well overall. Weight is stable.  BP is high but she has not taken any of her meds yet today.  SBP < 140 when she checks at home.  She works out at [employer] 3 times/week.  She is trying to work on weight loss.  No exertional dyspnea or chest pain.   Labs (4/15): K 5.2 creatinine 1.6, digoxin 1.0  Labs (11/15): K 5.1, creatinine 1.4 Labs 09/2015: K 4.6 Creatinine 1.53  Labs (8/17): K 4.8, creatinine 1.53, hgb 11.6 Labs (10/17): K 4.7, creatinine 1.37, BNP 74 Labs (2/18): K 4.9, creatinine 1.47, hgb 11.5 Labs (7/18): hgb 11.6, K 4.5, creatinine 1.5   PMH:  1. Obesity 2. GERD 3. OA 4. EGD negative in 10/14 5. ACEI cough 6. Atrial fibrillation: First diagnosed in 10/14.  DCCV to NSR in 09/2013 but atrial fibrillation recurred. Cardioversion 10/18/2013 successful on amiodarone but back in atrial fibrillation in 11/14.  Atrial fibrillation ablation ([facility]) 12/14.  7. Cardiomyopathy: Echo (10/14) with EF 20-25%, diffuse HK.  LHC (10/14) showed no CAD.  TSH normal, HIV negative, SPEP/UPEP negative.  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  TEE (12/14) with EF 30%, diffuse hypokinesis, mild MR, mildly decreased RV systolic  function.  Echo (4/15) with EF 45%, diffuse hypokinesis, mildly dilated LV, normal RV size and systolic function.  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 - Echo (6/17) with EF 25-30%, mild MR.  - Echo (6/18) with EF 45-50%, mildly dilated LV, mild MR.  8. Sinus bradycardia 9. OSA: Cannot tolerate CPAP. 10. CKD  SH: Married, lives in [place], nonsmoker.   FH: No history of cardiomyopathy or premature CAD.   ROS: All systems reviewed and negative except as per HPI.   Current Outpatient Medications  Medication Sig Dispense Refill  . apixaban (ELIQUIS) 5 MG TABS tablet Take 1 tablet (5 mg total) by mouth 2 (two) times daily. 180 tablet 3  . carvedilol (COREG) 12.5 MG tablet TAKE 1 TABLET BY MOUTH TWICE DAILY WITH A MEAL 180 tablet 3  . famotidine (PEPCID) 20 MG tablet Take 1 tablet (20 mg total) by mouth 2 (two) times daily. 60 tablet 3  . losartan (COZAAR) 25 MG tablet TAKE 1 TABLET TWICE DAILY 180 tablet 2  . Multiple Vitamins-Minerals (MULTIVITAMIN WITH MINERALS) tablet Take 1 tablet by mouth daily.    Marland Kitchen spironolactone (ALDACTONE) 25 MG tablet TAKE 1 TABLET (25 MG TOTAL) BY MOUTH DAILY. 90 tablet 0   No current facility-administered medications for this encounter.     Vitals:   07/04/18 1126  BP: (!) 168/70  Pulse: 70  SpO2: 98%  Weight: 256 lb 12.8 oz (116.5 kg)   General: NAD Neck: No JVD, no thyromegaly or thyroid nodule.  Lungs: Clear to auscultation bilaterally with normal respiratory effort. CV: Nondisplaced PMI.  Heart regular S1/S2, no S3/S4, no murmur.  No peripheral edema.  No carotid bruit.  Normal pedal pulses.  Abdomen: Soft, nontender, no hepatosplenomegaly, no distention.  Skin: Intact without lesions or rashes.  Neurologic: Alert and oriented x 3.  Psych: Normal affect. Extremities: No clubbing or cyanosis.  HEENT: Normal.   Assessment/Plan: 1. 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 Echo 05/2016 EF 25-30%,  most recent Echo 6/18 with EF 45-50%, improved.  She is not volume overloaded on exam, NYHA class II symptoms.  - She was unable to tolerate Entresto due to lightheadedness.  - Continue losartan 25 mg BID. We have tried to increase this in the past to 25 mg in the am and 50mg  in the pm but she did not tolerate it.  BMET today.  - Continue current Coreg and spironolactone.  - She does not tolerate medication titrations well historically. Will keep meds the same.  - I will repeat echo to reassess LV EF.  2. Atrial fibrillation: Paroxysmal.  She is in NSR today.  - No longer on Amiodarone.  - Continue apixaban. CBC today.  3. OSA: She has been diagnosed in the past but cannot tolerate CPAP.   Follow up in 6 months.   [name] 07/04/2018

## 2018-07-11 ENCOUNTER — Other Ambulatory Visit (HOSPITAL_COMMUNITY): Payer: Self-pay | Admitting: *Deleted

## 2018-07-11 ENCOUNTER — Encounter (HOSPITAL_COMMUNITY): Payer: Self-pay | Admitting: Radiology

## 2018-07-11 NOTE — Telephone Encounter (Signed)
I spoke with patient and she stated that she will set this appointment up before she follows up with Dr. Shirlee Latch in 6 months.

## 2018-08-02 ENCOUNTER — Other Ambulatory Visit (HOSPITAL_COMMUNITY): Payer: Self-pay | Admitting: Cardiology

## 2019-01-21 ENCOUNTER — Encounter (INDEPENDENT_AMBULATORY_CARE_PROVIDER_SITE_OTHER): Payer: Self-pay

## 2019-01-21 ENCOUNTER — Encounter: Payer: Self-pay | Admitting: Internal Medicine

## 2019-01-21 ENCOUNTER — Ambulatory Visit: Payer: Medicare Other | Admitting: Internal Medicine

## 2019-01-21 VITALS — BP 110/70 | HR 107 | Wt 261.6 lb

## 2019-01-21 DIAGNOSIS — I480 Paroxysmal atrial fibrillation: Secondary | ICD-10-CM | POA: Diagnosis not present

## 2019-01-21 DIAGNOSIS — I5022 Chronic systolic (congestive) heart failure: Secondary | ICD-10-CM

## 2019-01-21 DIAGNOSIS — Z6841 Body Mass Index (BMI) 40.0 and over, adult: Secondary | ICD-10-CM

## 2019-01-21 NOTE — Patient Instructions (Addendum)
Medication Instructions:  Your physician recommends that you continue on your current medications as directed. Please refer to the Current Medication list given to you today.  Labwork: None ordered.  Testing/Procedures: None ordered.  Follow-Up: Your physician wants you to follow-up in: one week with Rudi Coco NP at the AFIB clinic.   Office visit   Any Other Special Instructions Will Be Listed Below (If Applicable).  If you need a refill on your cardiac medications before your next appointment, please call your pharmacy.

## 2019-01-21 NOTE — Progress Notes (Signed)
PCP: Barbie Banner, MD Primary Cardiologist: Dr Shirlee Latch Primary EP: Dr Johney Frame  Kelli Webster is a 73 y.o. female who presents today for routine electrophysiology followup.  Since last being seen in our clinic, the patient reports doing reasonably well.  She has not been diligent with exercise and weight loss.  She is concerned with her mother who has chronic illness and frequent appointments.  She states "I have put my health on the back burner".  She is in afib today but unaware.  + rare palpitations.  Today, she denies symptoms of chest pain, shortness of breath,  lower extremity edema, dizziness, presyncope, or syncope.  The patient is otherwise without complaint today.   Past Medical History:  Diagnosis Date  . Chronic systolic heart failure (HCC)    a. ECHO (09/2013) EF 20-25% diff HK, mild/mod MR, LA mod dil, RV mildly dil, RA mildly dil b. R/LHC (09/26/13) AO 133/88, LV 137/17, Lmain no dz, LAD 30% prox, LCX mild luminal irreg; RCA mild luminal irreg  . GERD (gastroesophageal reflux disease)    a. 09/2013 endoscopy nl  . Morbid obesity (HCC)   . Osteoarthritis   . Persistent atrial fibrillation Chads2vascscore of at least 4(02/12/15)   a. Dx 09/2013 b. s/p PVI by Dr Johney Frame 11-26-2013   Past Surgical History:  Procedure Laterality Date  . ABLATION  11-26-2013   PVI by Dr Johney Frame  . arthroscopy       left knee    . ATRIAL FIBRILLATION ABLATION N/A 11/26/2013   Procedure: ATRIAL FIBRILLATION ABLATION;  Surgeon: Gardiner Rhyme, MD;  Location: [facility] CATH LAB;  Service: Cardiovascular;  Laterality: N/A;  . CARDIOVERSION N/A 10/01/2013   Procedure: CARDIOVERSION;  Surgeon: Laurey Morale, MD;  Location: [facility] ENDOSCOPY;  Service: Cardiovascular;  Laterality: N/A;  . CARDIOVERSION N/A 10/17/2013   Procedure: CARDIOVERSION;  Surgeon: Laurey Morale, MD;  Location: [facility] ENDOSCOPY;  Service: Cardiovascular;  Laterality: N/A;  . ESOPHAGOGASTRODUODENOSCOPY N/A 09/25/2013   Procedure:  ESOPHAGOGASTRODUODENOSCOPY (EGD);  Surgeon: Graylin Shiver, MD;  Location[facility] ENDOSCOPY;  Service: Endoscopy;  Laterality: N/A;  . fx left wrist     open reduction & internal fixaction  . LEFT HEART CATHETERIZATION WITH CORONARY ANGIOGRAM  09/26/2013   Procedure: LEFT HEART CATHETERIZATION WITH CORONARY ANGIOGRAM;  Surgeon: Laurey Morale, MD;  Location: [facility] CATH LAB;  Service: Cardiovascular;;  . TEE WITHOUT CARDIOVERSION N/A 10/01/2013   Procedure: TRANSESOPHAGEAL ECHOCARDIOGRAM (TEE);  Surgeon: Laurey Morale, MD;  Location: [facility] ENDOSCOPY;  Service: Cardiovascular;  Laterality: N/A;  . TEE WITHOUT CARDIOVERSION N/A 11/26/2013   Procedure: TRANSESOPHAGEAL ECHOCARDIOGRAM (TEE);  Surgeon: Laurey Morale, MD;  Location: [facility] ENDOSCOPY;  Service: Cardiovascular;  Laterality: N/A;    ROS- all systems are reviewed and negatives except as per HPI above  Current Outpatient Medications  Medication Sig Dispense Refill  . apixaban (ELIQUIS) 5 MG TABS tablet Take 1 tablet (5 mg total) by mouth 2 (two) times daily. 180 tablet 3  . carvedilol (COREG) 12.5 MG tablet TAKE 1 TABLET BY MOUTH TWICE DAILY WITH A MEAL 180 tablet 3  . famotidine (PEPCID) 20 MG tablet Take 1 tablet (20 mg total) by mouth 2 (two) times daily. 60 tablet 3  . losartan (COZAAR) 25 MG tablet TAKE 1 TABLET TWICE DAILY 180 tablet 2  . Multiple Vitamins-Minerals (MULTIVITAMIN WITH MINERALS) tablet Take 1 tablet by mouth daily.    Marland Kitchen spironolactone (ALDACTONE) 25 MG tablet TAKE 1 TABLET BY MOUTH EVERY DAY  NEED OFFICE VISIT 90 tablet 1   No current facility-administered medications for this visit.     Physical Exam: Vitals:   01/21/19 1234  BP: 110/70  Pulse: (!) 107  SpO2: 97%  Weight: 261 lb 9.6 oz (118.7 kg)    GEN- The patient is overweight appearing, alert and oriented x 3 today.   Head- normocephalic, atraumatic Eyes-  Sclera clear, conjunctiva pink Ears- hearing intact Oropharynx- clear Lungs- Clear to ausculation  bilaterally, normal work of breathing Heart- irregular rate and rhythm, no murmurs, rubs or gallops, PMI not laterally displaced GI- soft, NT, ND, + BS Extremities- no clubbing, cyanosis, or edema  Wt Readings from Last 3 Encounters:  01/21/19 261 lb 9.6 oz (118.7 kg)  07/04/18 256 lb 12.8 oz (116.5 kg)  09/11/17 259 lb (117.5 kg)    EKG tracing ordered today is personally reviewed and shows afib  Assessment and Plan:  1. Paroxysmal atrial fibrillation chads2vasc score is 3.  Continue eliquis She is in afib today but unaware.  Rates are reasonably controlled. Unfortunately, she has not been diligent with lifestyle change I have advised follow-up in AF clinic in 1 week.  If still in AF, I have advised cardioversion.  She states "I will not have another cardioversion.  I have had two and they do not work". I have explained the importance of rhythm control with her previous tachycardia induced CM.  I worry that her insight into her illness is not very good.  I have been stern today, stating that her risks of worsening CHF and even increased mortality long term associated with her uncontrolled AF and lifestyle are important for her to realize.  2. Obesity Body mass index is 46.34 kg/m.   I have again advised lifestyle change today.  Ultimately, I think that bariatric surgery could be beneficial.  3. Nonischemic CM/ chronic systolic dysfunction EF 45% Followed by Dr Shirlee Latch Hopefully, she will be in sinus on follow-up to the AF clinic.  If still in AF, I would advise consideration of cardioversion if she will allow.  We could consider tikosyn long term.  I would not advise repeat ablation without substantial lifestyle change.  Return to see Lupita Leash in the AF clinic in 1 week  Hillis Range MD, [facility] 01/21/2019 3:57 PM

## 2019-01-23 ENCOUNTER — Other Ambulatory Visit (HOSPITAL_COMMUNITY): Payer: Self-pay | Admitting: Cardiology

## 2019-02-06 ENCOUNTER — Encounter (HOSPITAL_COMMUNITY): Payer: Self-pay | Admitting: Nurse Practitioner

## 2019-02-06 ENCOUNTER — Ambulatory Visit (HOSPITAL_COMMUNITY)
Admission: RE | Admit: 2019-02-06 | Discharge: 2019-02-06 | Disposition: A | Discharge status: Home / Self Care | Payer: Medicare Other | Source: Ambulatory Visit | Attending: Nurse Practitioner | Admitting: Nurse Practitioner

## 2019-02-06 VITALS — BP 124/82 | HR 83 | Ht 63.0 in | Wt 254.0 lb

## 2019-02-06 DIAGNOSIS — I444 Left anterior fascicular block: Secondary | ICD-10-CM | POA: Insufficient documentation

## 2019-02-06 DIAGNOSIS — K219 Gastro-esophageal reflux disease without esophagitis: Secondary | ICD-10-CM | POA: Diagnosis not present

## 2019-02-06 DIAGNOSIS — Z79899 Other long term (current) drug therapy: Secondary | ICD-10-CM | POA: Insufficient documentation

## 2019-02-06 DIAGNOSIS — Z6841 Body Mass Index (BMI) 40.0 and over, adult: Secondary | ICD-10-CM | POA: Diagnosis not present

## 2019-02-06 DIAGNOSIS — Z7901 Long term (current) use of anticoagulants: Secondary | ICD-10-CM | POA: Insufficient documentation

## 2019-02-06 DIAGNOSIS — I4819 Other persistent atrial fibrillation: Secondary | ICD-10-CM

## 2019-02-06 DIAGNOSIS — Z8249 Family history of ischemic heart disease and other diseases of the circulatory system: Secondary | ICD-10-CM | POA: Insufficient documentation

## 2019-02-06 LAB — CBC
HCT: 40.2 % (ref 36.0–46.0)
Hemoglobin: 12.7 g/dL (ref 12.0–15.0)
MCH: 28.3 pg (ref 26.0–34.0)
MCHC: 31.6 g/dL (ref 30.0–36.0)
MCV: 89.7 fL (ref 80.0–100.0)
Platelets: 372 10*3/uL (ref 150–400)
RBC: 4.48 MIL/uL (ref 3.87–5.11)
RDW: 12.9 % (ref 11.5–15.5)
WBC: 6.5 10*3/uL (ref 4.0–10.5)
nRBC: 0 % (ref 0.0–0.2)

## 2019-02-06 LAB — BASIC METABOLIC PANEL
Anion gap: 8 (ref 5–15)
BUN: 34 mg/dL — ABNORMAL HIGH (ref 8–23)
CO2: 25 mmol/L (ref 22–32)
Calcium: 9.6 mg/dL (ref 8.9–10.3)
Chloride: 104 mmol/L (ref 98–111)
Creatinine, Ser: 1.6 mg/dL — ABNORMAL HIGH (ref 0.44–1.00)
GFR calc Af Amer: 37 mL/min — ABNORMAL LOW (ref >60)
GFR calc non Af Amer: 32 mL/min — ABNORMAL LOW (ref >60)
Glucose, Bld: 175 mg/dL — ABNORMAL HIGH (ref 70–99)
Potassium: 5 mmol/L (ref 3.5–5.1)
Sodium: 137 mmol/L (ref 135–145)

## 2019-02-06 NOTE — Progress Notes (Signed)
Primary Care Physician: Barbie Banner, MD Referring Physician: Dr. Johney Frame Cardiologist: Dr. Ramonita Lab Kelli is a 73 y.o. female with a h/o, HF,  Afib, s/p ablation 2014,on short term amiodarone at that time, that was seen by Dr. Johney Frame 01/21/19 and found to be in afib. She reports that she was under a lot of stress and noted an increase in heart rate then from the 60's to the 80's which has been consistent since that time. Dr. Johney Frame wanted her to have a cardioversion at his visit but she pushed back stating that they had not been successful in the past. Dr. Johney Frame also mentioned that admission for tikosyn was also an option. After much discussion, pt would like to proceed with cardioversion, not ready for a hospitlization, understanding that it might not be successful long term.   Today, she denies symptoms of palpitations, chest pain, shortness of breath, orthopnea, PND, lower extremity edema, dizziness, presyncope, syncope, or neurologic sequela. The patient is tolerating medications without difficulties and is otherwise without complaint today.   Past Medical History:  Diagnosis Date  . Chronic systolic heart failure (HCC)    a. ECHO (09/2013) EF 20-25% diff HK, mild/mod MR, LA mod dil, RV mildly dil, RA mildly dil b. R/LHC (09/26/13) AO 133/88, LV 137/17, Lmain no dz, LAD 30% prox, LCX mild luminal irreg; RCA mild luminal irreg  . GERD (gastroesophageal reflux disease)    a. 09/2013 endoscopy nl  . Morbid obesity (HCC)   . Osteoarthritis   . Persistent atrial fibrillation Chads2vascscore of at least 4(02/12/15)   a. Dx 09/2013 b. s/p PVI by Dr Johney Frame 11-26-2013   Past Surgical History:  Procedure Laterality Date  . ABLATION  11-26-2013   PVI by Dr Johney Frame  . arthroscopy       left knee    . ATRIAL FIBRILLATION ABLATION N/A 11/26/2013   Procedure: ATRIAL FIBRILLATION ABLATION;  Surgeon: Gardiner Rhyme, MD;  Location: [facility] CATH LAB;  Service: Cardiovascular;  Laterality: N/A;  .  CARDIOVERSION N/A 10/01/2013   Procedure: CARDIOVERSION;  Surgeon: Laurey Morale, MD;  Location[facility] ENDOSCOPY;  Service: Cardiovascular;  Laterality: N/A;  . CARDIOVERSION N/A 10/17/2013   Procedure: CARDIOVERSION;  Surgeon: Laurey Morale, MD;  Location: [facility] ENDOSCOPY;  Service: Cardiovascular;  Laterality: N/A;  . ESOPHAGOGASTRODUODENOSCOPY N/A 09/25/2013   Procedure: ESOPHAGOGASTRODUODENOSCOPY (EGD);  Surgeon: Graylin Shiver, MD;  Location: [facility] ENDOSCOPY;  Service: Endoscopy;  Laterality: N/A;  . fx left wrist     open reduction & internal fixaction  . LEFT HEART CATHETERIZATION WITH CORONARY ANGIOGRAM  09/26/2013   Procedure: LEFT HEART CATHETERIZATION WITH CORONARY ANGIOGRAM;  Surgeon: Laurey Morale, MD;  Location[facility] CATH LAB;  Service: Cardiovascular;;  . TEE WITHOUT CARDIOVERSION N/A 10/01/2013   Procedure: TRANSESOPHAGEAL ECHOCARDIOGRAM (TEE);  Surgeon: Laurey Morale, MD;  Location: [facility] ENDOSCOPY;  Service: Cardiovascular;  Laterality: N/A;  . TEE WITHOUT CARDIOVERSION N/A 11/26/2013   Procedure: TRANSESOPHAGEAL ECHOCARDIOGRAM (TEE);  Surgeon: Laurey Morale, MD;  Location: [facility] ENDOSCOPY;  Service: Cardiovascular;  Laterality: N/A;    Current Outpatient Medications  Medication Sig Dispense Refill  . apixaban (ELIQUIS) 5 MG TABS tablet Take 1 tablet (5 mg total) by mouth 2 (two) times daily. 180 tablet 3  . carvedilol (COREG) 12.5 MG tablet TAKE 1 TABLET BY MOUTH TWICE DAILY WITH A MEAL 180 tablet 3  . famotidine (PEPCID) 20 MG tablet Take 1 tablet (20 mg total) by mouth 2 (two) times  daily. (Patient taking differently: Take 20 mg by mouth as needed. ) 60 tablet 3  . losartan (COZAAR) 25 MG tablet Take 1 tablet (25 mg total) by mouth 2 (two) times daily. Please call for Office visit [phone] 180 tablet 2  . Multiple Vitamins-Minerals (MULTIVITAMIN WITH MINERALS) tablet Take 1 tablet by mouth daily.    . spironolactone (ALDACTONE) 25 MG tablet TAKE 1 TABLET BY MOUTH EVERY DAY NEED  OFFICE VISIT (Patient taking differently: No sig reported) 90 tablet 1   No current facility-administered medications for this encounter.     No Known Allergies  Social History   Socioeconomic History  . Marital status: Married    Spouse name: Not on file  . Number of children: Not on file  . Years of education: Not on file  . Highest education level: Not on file  Occupational History  . Not on file  Social Needs  . Financial resource strain: Not on file  . Food insecurity:    Worry: Not on file    Inability: Not on file  . Transportation needs:    Medical: Not on file    Non-medical: Not on file  Tobacco Use  . Smoking status: Never Smoker  . Smokeless tobacco: Never Used  Substance and Sexual Activity  . Alcohol use: No  . Drug use: No  . Sexual activity: Yes    Birth control/protection: Post-menopausal  Lifestyle  . Physical activity:    Days per week: Not on file    Minutes per session: Not on file  . Stress: Not on file  Relationships  . Social connections:    Talks on phone: Not on file    Gets together: Not on file    Attends religious service: Not on file    Active member of club or organization: Not on file    Attends meetings of clubs or organizations: Not on file    Relationship status: Not on file  . Intimate partner violence:    Fear of current or ex partner: Not on file    Emotionally abused: Not on file    Physically abused: Not on file    Forced sexual activity: Not on file  Other Topics Concern  . Not on file  Social History Narrative   Lives in [place] with husband.  Does not routinely exercise.   Realtor with [employer]    Family History  Problem Relation Age of Onset  . Heart attack Father         multiple MI's  . Healthy Mother   . Healthy Brother   . Healthy Sister   . Healthy Sister     ROS- All systems are reviewed and negative except as per the HPI above  Physical Exam: Vitals:   02/06/19 1329  BP: 124/82  Pulse: 83    Weight: 115.2 kg  Height: 5' 3" (1.6 m)   Wt Readings from Last 3 Encounters:  02/06/19 115.2 kg  01/21/19 118.7 kg  07/04/18 116.5 kg    Labs: Lab Results  Component Value Date   NA 139 07/04/2018   K 4.8 07/04/2018   CL 104 07/04/2018   CO2 26 07/04/2018   GLUCOSE 130 (H) 07/04/2018   BUN 28 (H) 07/04/2018   CREATININE 1.50 (H) 07/04/2018   CALCIUM 9.4 07/04/2018   MG 1.8 09/26/2013   Lab Results  Component Value Date   INR 1.09 09/23/2013   Lab Results  Component Value Date   CHOL 213 (H)   09/30/2015   HDL 56 09/30/2015   LDLCALC 127 (H) 09/30/2015   TRIG 150 (H) 09/30/2015     GEN- The patient is well appearing, alert and oriented x 3 today.   Head- normocephalic, atraumatic Eyes-  Sclera clear, conjunctiva pink Ears- hearing intact Oropharynx- clear Neck- supple, no JVP Lymph- no cervical lymphadenopathy Lungs- Clear to ausculation bilaterally, normal work of breathing Heart-  Mildly irregular rate and rhythm, no murmurs, rubs or gallops, PMI not laterally displaced GI- soft, NT, ND, + BS Extremities- no clubbing, cyanosis, or edema MS- no significant deformity or atrophy Skin- no rash or lesion Psych- euthymic mood, full affect Neuro- strength and sensation are intact  EKG-afib at 83 bpm, LAFB, qrs int 104 ms, qtc 430 ms Echo scheduled for 3/3 cancelled and will be rescheduled after cardioversion    Assessment and Plan: 1. Persistent  afib/flutter  After much discussion, pt is willing to proceed with cardioversion Scheduled for 3/4 with Dr. Quinn Plowman no missed doses of eliquis for the last 3 weeks  Discussed Tikosyn if ERAF  Bmet/cbc today  2. HF Weight stable Avoid salt   F/u here in one week after cardioversion Will reschedule echo at that time  Kelli Webster [facility] [address] [place], [place] [address] [phone]

## 2019-02-06 NOTE — H&P (View-Only) (Signed)
Primary Care Physician: Barbie Banner, MD Referring Physician: Dr. Johney Frame Cardiologist: Dr. Ramonita Lab Kelli is a 73 y.o. female with a h/o, HF,  Afib, s/p ablation 2014,on short term amiodarone at that time, that was seen by Dr. Johney Frame 01/21/19 and found to be in afib. She reports that she was under a lot of stress and noted an increase in heart rate then from the 60's to the 80's which has been consistent since that time. Dr. Johney Frame wanted her to have a cardioversion at his visit but she pushed back stating that they had not been successful in the past. Dr. Johney Frame also mentioned that admission for tikosyn was also an option. After much discussion, pt would like to proceed with cardioversion, not ready for a hospitlization, understanding that it might not be successful long term.   Today, she denies symptoms of palpitations, chest pain, shortness of breath, orthopnea, PND, lower extremity edema, dizziness, presyncope, syncope, or neurologic sequela. The patient is tolerating medications without difficulties and is otherwise without complaint today.   Past Medical History:  Diagnosis Date  . Chronic systolic heart failure (HCC)    a. ECHO (09/2013) EF 20-25% diff HK, mild/mod MR, LA mod dil, RV mildly dil, RA mildly dil b. R/LHC (09/26/13) AO 133/88, LV 137/17, Lmain no dz, LAD 30% prox, LCX mild luminal irreg; RCA mild luminal irreg  . GERD (gastroesophageal reflux disease)    a. 09/2013 endoscopy nl  . Morbid obesity (HCC)   . Osteoarthritis   . Persistent atrial fibrillation Chads2vascscore of at least 4(02/12/15)   a. Dx 09/2013 b. s/p PVI by Dr Johney Frame 11-26-2013   Past Surgical History:  Procedure Laterality Date  . ABLATION  11-26-2013   PVI by Dr Johney Frame  . arthroscopy       left knee    . ATRIAL FIBRILLATION ABLATION N/A 11/26/2013   Procedure: ATRIAL FIBRILLATION ABLATION;  Surgeon: Gardiner Rhyme, MD;  Location: [facility] CATH LAB;  Service: Cardiovascular;  Laterality: N/A;  .  CARDIOVERSION N/A 10/01/2013   Procedure: CARDIOVERSION;  Surgeon: Laurey Morale, MD;  Location: [facility] ENDOSCOPY;  Service: Cardiovascular;  Laterality: N/A;  . CARDIOVERSION N/A 10/17/2013   Procedure: CARDIOVERSION;  Surgeon: Laurey Morale, MD;  Location: [facility] ENDOSCOPY;  Service: Cardiovascular;  Laterality: N/A;  . ESOPHAGOGASTRODUODENOSCOPY N/A 09/25/2013   Procedure: ESOPHAGOGASTRODUODENOSCOPY (EGD);  Surgeon: Graylin Shiver, MD;  Location: [facility] ENDOSCOPY;  Service: Endoscopy;  Laterality: N/A;  . fx left wrist     open reduction & internal fixaction  . LEFT HEART CATHETERIZATION WITH CORONARY ANGIOGRAM  09/26/2013   Procedure: LEFT HEART CATHETERIZATION WITH CORONARY ANGIOGRAM;  Surgeon: Laurey Morale, MD;  Location: [facility] CATH LAB;  Service: Cardiovascular;;  . TEE WITHOUT CARDIOVERSION N/A 10/01/2013   Procedure: TRANSESOPHAGEAL ECHOCARDIOGRAM (TEE);  Surgeon: Laurey Morale, MD;  Location: [facility] ENDOSCOPY;  Service: Cardiovascular;  Laterality: N/A;  . TEE WITHOUT CARDIOVERSION N/A 11/26/2013   Procedure: TRANSESOPHAGEAL ECHOCARDIOGRAM (TEE);  Surgeon: Laurey Morale, MD;  Location: [facility] ENDOSCOPY;  Service: Cardiovascular;  Laterality: N/A;    Current Outpatient Medications  Medication Sig Dispense Refill  . apixaban (ELIQUIS) 5 MG TABS tablet Take 1 tablet (5 mg total) by mouth 2 (two) times daily. 180 tablet 3  . carvedilol (COREG) 12.5 MG tablet TAKE 1 TABLET BY MOUTH TWICE DAILY WITH A MEAL 180 tablet 3  . famotidine (PEPCID) 20 MG tablet Take 1 tablet (20 mg total) by mouth 2 (two) times  daily. (Patient taking differently: Take 20 mg by mouth as needed. ) 60 tablet 3  . losartan (COZAAR) 25 MG tablet Take 1 tablet (25 mg total) by mouth 2 (two) times daily. Please call for Office visit [phone] 180 tablet 2  . Multiple Vitamins-Minerals (MULTIVITAMIN WITH MINERALS) tablet Take 1 tablet by mouth daily.    Marland Kitchen spironolactone (ALDACTONE) 25 MG tablet TAKE 1 TABLET BY MOUTH EVERY DAY NEED  OFFICE VISIT (Patient taking differently: No sig reported) 90 tablet 1   No current facility-administered medications for this encounter.     No Known Allergies  Social History   Socioeconomic History  . Marital status: Married    Spouse name: Not on file  . Number of children: Not on file  . Years of education: Not on file  . Highest education level: Not on file  Occupational History  . Not on file  Social Needs  . Financial resource strain: Not on file  . Food insecurity:    Worry: Not on file    Inability: Not on file  . Transportation needs:    Medical: Not on file    Non-medical: Not on file  Tobacco Use  . Smoking status: Never Smoker  . Smokeless tobacco: Never Used  Substance and Sexual Activity  . Alcohol use: No  . Drug use: No  . Sexual activity: Yes    Birth control/protection: Post-menopausal  Lifestyle  . Physical activity:    Days per week: Not on file    Minutes per session: Not on file  . Stress: Not on file  Relationships  . Social connections:    Talks on phone: Not on file    Gets together: Not on file    Attends religious service: Not on file    Active member of club or organization: Not on file    Attends meetings of clubs or organizations: Not on file    Relationship status: Not on file  . Intimate partner violence:    Fear of current or ex partner: Not on file    Emotionally abused: Not on file    Physically abused: Not on file    Forced sexual activity: Not on file  Other Topics Concern  . Not on file  Social History Narrative   Lives in [place] with husband.  Does not routinely exercise.   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 Heart attack Father         multiple MI's  . Healthy Mother   . Healthy Brother   . Healthy Sister   . Healthy Sister     ROS- All systems are reviewed and negative except as per the HPI above  Physical Exam: Vitals:   02/06/19 1329  BP: 124/82  Pulse: 83    Weight: 115.2 kg  Height: 5\' 3"  (1.6 m)   Wt Readings from Last 3 Encounters:  02/06/19 115.2 kg  01/21/19 118.7 kg  07/04/18 116.5 kg    Labs: Lab Results  Component Value Date   NA 139 07/04/2018   K 4.8 07/04/2018   CL 104 07/04/2018   CO2 26 07/04/2018   GLUCOSE 130 (H) 07/04/2018   BUN 28 (H) 07/04/2018   CREATININE 1.50 (H) 07/04/2018   CALCIUM 9.4 07/04/2018   MG 1.8 09/26/2013   Lab Results  Component Value Date   INR 1.09 09/23/2013   Lab Results  Component Value Date   CHOL 213 (H)  09/30/2015   HDL 56 09/30/2015   LDLCALC 127 (H) 09/30/2015   TRIG 150 (H) 09/30/2015     GEN- The patient is well appearing, alert and oriented x 3 today.   Head- normocephalic, atraumatic Eyes-  Sclera clear, conjunctiva pink Ears- hearing intact Oropharynx- clear Neck- supple, no JVP Lymph- no cervical lymphadenopathy Lungs- Clear to ausculation bilaterally, normal work of breathing Heart-  Mildly irregular rate and rhythm, no murmurs, rubs or gallops, PMI not laterally displaced GI- soft, NT, ND, + BS Extremities- no clubbing, cyanosis, or edema MS- no significant deformity or atrophy Skin- no rash or lesion Psych- euthymic mood, full affect Neuro- strength and sensation are intact  EKG-afib at 83 bpm, LAFB, qrs int 104 ms, qtc 430 ms Echo scheduled for 3/3 cancelled and will be rescheduled after cardioversion    Assessment and Plan: 1. Persistent  afib/flutter  After much discussion, pt is willing to proceed with cardioversion Scheduled for 3/4 with Dr. Quinn Plowman no missed doses of eliquis for the last 3 weeks  Discussed Tikosyn if ERAF  Bmet/cbc today  2. HF Weight stable Avoid salt   F/u here in one week after cardioversion Will reschedule echo at that time  Kelli Webster [facility] [address] [place], [place] [address] [phone]

## 2019-02-06 NOTE — Patient Instructions (Addendum)
Cardioversion scheduled for Wednesday, March 4th  - Arrive at the [facility] and go to admitting at 12PM  -Do not eat or drink anything after midnight the night prior to your procedure.  - Take all your morning medication with a sip of water prior to arrival.  - You will not be able to drive home after your procedure.

## 2019-02-08 ENCOUNTER — Encounter: Payer: Self-pay | Admitting: Cardiology

## 2019-02-19 ENCOUNTER — Other Ambulatory Visit (HOSPITAL_COMMUNITY): Payer: Medicare Other

## 2019-02-20 ENCOUNTER — Encounter (HOSPITAL_COMMUNITY): Payer: Self-pay | Admitting: *Deleted

## 2019-02-20 ENCOUNTER — Ambulatory Visit (HOSPITAL_COMMUNITY): Payer: Medicare Other | Admitting: Certified Registered Nurse Anesthetist

## 2019-02-20 ENCOUNTER — Other Ambulatory Visit: Payer: Self-pay

## 2019-02-20 ENCOUNTER — Encounter (HOSPITAL_COMMUNITY)
Admission: RE | Disposition: A | Discharge status: Home / Self Care | Payer: Self-pay | Source: Home / Self Care | Attending: Cardiology

## 2019-02-20 ENCOUNTER — Ambulatory Visit (HOSPITAL_COMMUNITY)
Admission: RE | Admit: 2019-02-20 | Discharge: 2019-02-20 | Disposition: A | Discharge status: Home / Self Care | Payer: Medicare Other | Attending: Cardiology | Admitting: Cardiology

## 2019-02-20 DIAGNOSIS — Z79899 Other long term (current) drug therapy: Secondary | ICD-10-CM | POA: Diagnosis not present

## 2019-02-20 DIAGNOSIS — I5022 Chronic systolic (congestive) heart failure: Secondary | ICD-10-CM | POA: Diagnosis not present

## 2019-02-20 DIAGNOSIS — Z7901 Long term (current) use of anticoagulants: Secondary | ICD-10-CM | POA: Insufficient documentation

## 2019-02-20 DIAGNOSIS — I4819 Other persistent atrial fibrillation: Secondary | ICD-10-CM | POA: Insufficient documentation

## 2019-02-20 DIAGNOSIS — Z6841 Body Mass Index (BMI) 40.0 and over, adult: Secondary | ICD-10-CM | POA: Insufficient documentation

## 2019-02-20 DIAGNOSIS — I4891 Unspecified atrial fibrillation: Secondary | ICD-10-CM | POA: Diagnosis not present

## 2019-02-20 DIAGNOSIS — I4892 Unspecified atrial flutter: Secondary | ICD-10-CM | POA: Insufficient documentation

## 2019-02-20 HISTORY — PX: CARDIOVERSION: SHX1299

## 2019-02-20 SURGERY — CARDIOVERSION
Anesthesia: General

## 2019-02-20 MED ORDER — SODIUM CHLORIDE 0.9 % IV SOLN
INTRAVENOUS | Status: DC
  Administered 2019-02-20: 13:00:00 via INTRAVENOUS

## 2019-02-20 MED ORDER — EPHEDRINE SULFATE-NACL 50-0.9 MG/10ML-% IV SOSY
PREFILLED_SYRINGE | INTRAVENOUS | Status: DC | PRN
  Administered 2019-02-20: 15 mg via INTRAVENOUS
  Administered 2019-02-20: 20 mg via INTRAVENOUS

## 2019-02-20 MED ORDER — PROPOFOL 10 MG/ML IV BOLUS
INTRAVENOUS | Status: DC | PRN
  Administered 2019-02-20: 70 mg via INTRAVENOUS

## 2019-02-20 MED ORDER — LIDOCAINE 2% (20 MG/ML) 5 ML SYRINGE
INTRAMUSCULAR | Status: DC | PRN
  Administered 2019-02-20: 100 mg via INTRAVENOUS

## 2019-02-20 NOTE — Anesthesia Procedure Notes (Signed)
Procedure Name: General with mask airway Date/Time: 02/20/2019 1:38 PM Performed by: Janora Norlander, CRNA Pre-anesthesia Checklist: Patient identified, Emergency Drugs available, Suction available and Patient being monitored Patient Re-evaluated:Patient Re-evaluated prior to induction Oxygen Delivery Method: Ambu bag Preoxygenation: Pre-oxygenation with 100% oxygen

## 2019-02-20 NOTE — Transfer of Care (Signed)
Immediate Anesthesia Transfer of Care Note  Patient: Kelli Webster  Procedure(s) Performed: CARDIOVERSION (N/A )  Patient Location: Endoscopy Unit  Anesthesia Type:General  Level of Consciousness: awake, alert  and patient cooperative  Airway & Oxygen Therapy: Patient Spontanous Breathing  Post-op Assessment: Report given to RN and Post -op Vital signs reviewed and stable  Post vital signs: Reviewed and stable  Last Vitals:  Vitals Value Taken Time  BP 97/39 02/20/2019  1:51 PM  Temp    Pulse 58 02/20/2019  1:51 PM  Resp 21 02/20/2019  1:51 PM  SpO2 97 % 02/20/2019  1:51 PM    Last Pain:  Vitals:   02/20/19 1246  TempSrc: Oral  PainSc: 0-No pain         Complications: No apparent anesthesia complications

## 2019-02-20 NOTE — Procedures (Signed)
Electrical Cardioversion Procedure Note Kelli Webster [id number] 20-Jul-1946  Procedure: Electrical Cardioversion Indications:  Atrial Fibrillation  Procedure Details Consent: Risks of procedure as well as the alternatives and risks of each were explained to the (patient/caregiver).  Consent for procedure obtained. Time Out: Verified patient identification, verified procedure, site/side was marked, verified correct patient position, special equipment/implants available, medications/allergies/relevent history reviewed, required imaging and test results available.  Performed  Patient placed on cardiac monitor, pulse oximetry, supplemental oxygen as necessary.  Sedation given: Propofol per anesthesiology Pacer pads placed anterior and posterior chest.  Cardioverted 1 time(s).  Cardioverted at 200J.  Evaluation Findings: Post procedure EKG shows: NSR Complications: None Patient did tolerate procedure well.   Kelli Webster 02/20/2019, 1:49 PM

## 2019-02-20 NOTE — Interval H&P Note (Signed)
History and Physical Interval Note:  02/20/2019 1:38 PM  Kelli Webster  has presented today for surgery, with the diagnosis of a fib  The various methods of treatment have been discussed with the patient and family. After consideration of risks, benefits and other options for treatment, the patient has consented to  Procedure(s): CARDIOVERSION (N/A) as a surgical intervention .  The patient's history has been reviewed, patient examined, no change in status, stable for surgery.  I have reviewed the patient's chart and labs.  Questions were answered to the patient's satisfaction.     [name] [employer]

## 2019-02-20 NOTE — Interval H&P Note (Signed)
History and Physical Interval Note:  02/20/2019 1:38 PM  Kelli Webster  has presented today for surgery, with the diagnosis of a fib  The various methods of treatment have been discussed with the patient and family. After consideration of risks, benefits and other options for treatment, the patient has consented to  Procedure(s): CARDIOVERSION (N/A) as a surgical intervention .  The patient's history has been reviewed, patient examined, no change in status, stable for surgery.  I have reviewed the patient's chart and labs.  Questions were answered to the patient's satisfaction.     [name]   

## 2019-02-20 NOTE — Anesthesia Preprocedure Evaluation (Signed)
Anesthesia Evaluation  Patient identified by MRN, date of birth, ID band Patient awake    Reviewed: Allergy & Precautions, NPO status , Patient's Chart, lab work & pertinent test results  Airway Mallampati: I  TM Distance: >3 FB Neck ROM: Full    Dental   Pulmonary    Pulmonary exam normal        Cardiovascular Normal cardiovascular exam     Neuro/Psych    GI/Hepatic GERD  Medicated and Controlled,  Endo/Other    Renal/GU      Musculoskeletal   Abdominal   Peds  Hematology   Anesthesia Other Findings   Reproductive/Obstetrics                             Anesthesia Physical Anesthesia Plan  ASA: III  Anesthesia Plan: MAC   Post-op Pain Management:    Induction: Intravenous  PONV Risk Score and Plan: 2 and Treatment may vary due to age or medical condition  Airway Management Planned: Simple Face Mask  Additional Equipment:   Intra-op Plan:   Post-operative Plan:   Informed Consent: I have reviewed the patients History and Physical, chart, labs and discussed the procedure including the risks, benefits and alternatives for the proposed anesthesia with the patient or authorized representative who has indicated his/her understanding and acceptance.       Plan Discussed with: CRNA and Surgeon  Anesthesia Plan Comments:         Anesthesia Quick Evaluation

## 2019-02-20 NOTE — Anesthesia Postprocedure Evaluation (Signed)
Anesthesia Post Note  Patient: Kelli Webster  Procedure(s) Performed: CARDIOVERSION (N/A )     Patient location during evaluation: PACU Anesthesia Type: General Level of consciousness: awake and alert Pain management: pain level controlled Vital Signs Assessment: post-procedure vital signs reviewed and stable Respiratory status: spontaneous breathing, nonlabored ventilation, respiratory function stable and patient connected to nasal cannula oxygen Cardiovascular status: blood pressure returned to baseline and stable Postop Assessment: no apparent nausea or vomiting Anesthetic complications: no    Last Vitals:  Vitals:   02/20/19 1410 02/20/19 1430  BP: 99/66 122/68  Pulse:    Resp:    Temp:    SpO2:      Last Pain:  Vitals:   02/20/19 1420  TempSrc:   PainSc: 0-No pain                 Nysha Koplin DAVID

## 2019-02-22 ENCOUNTER — Encounter (HOSPITAL_COMMUNITY): Payer: Self-pay | Admitting: Cardiology

## 2019-02-25 ENCOUNTER — Other Ambulatory Visit (HOSPITAL_COMMUNITY): Payer: Self-pay | Admitting: Cardiology

## 2019-02-28 ENCOUNTER — Ambulatory Visit (HOSPITAL_COMMUNITY)
Admission: RE | Admit: 2019-02-28 | Discharge: 2019-02-28 | Disposition: A | Discharge status: Home / Self Care | Payer: Medicare Other | Source: Ambulatory Visit | Attending: Nurse Practitioner | Admitting: Nurse Practitioner

## 2019-02-28 ENCOUNTER — Other Ambulatory Visit: Payer: Self-pay

## 2019-02-28 ENCOUNTER — Encounter (HOSPITAL_COMMUNITY): Payer: Self-pay | Admitting: Nurse Practitioner

## 2019-02-28 VITALS — BP 118/78 | HR 71 | Ht 63.0 in | Wt 254.0 lb

## 2019-02-28 DIAGNOSIS — Z8249 Family history of ischemic heart disease and other diseases of the circulatory system: Secondary | ICD-10-CM | POA: Diagnosis not present

## 2019-02-28 DIAGNOSIS — I4891 Unspecified atrial fibrillation: Secondary | ICD-10-CM | POA: Diagnosis present

## 2019-02-28 DIAGNOSIS — G4733 Obstructive sleep apnea (adult) (pediatric): Secondary | ICD-10-CM | POA: Diagnosis not present

## 2019-02-28 DIAGNOSIS — I4819 Other persistent atrial fibrillation: Secondary | ICD-10-CM | POA: Diagnosis not present

## 2019-02-28 DIAGNOSIS — Z6841 Body Mass Index (BMI) 40.0 and over, adult: Secondary | ICD-10-CM | POA: Diagnosis not present

## 2019-02-28 DIAGNOSIS — M1990 Unspecified osteoarthritis, unspecified site: Secondary | ICD-10-CM | POA: Diagnosis not present

## 2019-02-28 DIAGNOSIS — Z7901 Long term (current) use of anticoagulants: Secondary | ICD-10-CM | POA: Diagnosis not present

## 2019-02-28 DIAGNOSIS — I428 Other cardiomyopathies: Secondary | ICD-10-CM | POA: Diagnosis not present

## 2019-02-28 DIAGNOSIS — Z79899 Other long term (current) drug therapy: Secondary | ICD-10-CM | POA: Diagnosis not present

## 2019-02-28 DIAGNOSIS — I4892 Unspecified atrial flutter: Secondary | ICD-10-CM | POA: Insufficient documentation

## 2019-02-28 NOTE — Progress Notes (Signed)
Primary Care Physician: Barbie Banner, MD Referring Physician: Dr. Johney Frame Cardiologist: Dr. Ramonita Lab Kelli Webster is a 73 y.o. female with a h/o, HF,  Afib, s/p ablation 2014,on short term amiodarone at that time, that was seen by Dr. Johney Frame 01/21/19 and found to be in afib. She reports that she was under a lot of stress and noted an increase in heart rate then from the 60's to the 80's which has been consistent since that time. Dr. Johney Frame wanted her to have a cardioversion at his visit but she pushed back stating that they had not been successful in the past. Dr. Johney Frame also mentioned that admission for tikosyn was also an option. After much discussion, pt would like to proceed with cardioversion.  On follow up today, patient reports that she feels much better after her DCCV with less fatigue and increased exercise tolerance. She is in SR today.   Today, she denies symptoms of palpitations, chest pain, shortness of breath, orthopnea, PND, lower extremity edema, dizziness, presyncope, syncope, or neurologic sequela. The patient is tolerating medications without difficulties and is otherwise without complaint today.   Past Medical History:  Diagnosis Date  . Chronic systolic heart failure (HCC)    a. ECHO (09/2013) EF 20-25% diff HK, mild/mod MR, LA mod dil, RV mildly dil, RA mildly dil b. R/LHC (09/26/13) AO 133/88, LV 137/17, Lmain no dz, LAD 30% prox, LCX mild luminal irreg; RCA mild luminal irreg  . GERD (gastroesophageal reflux disease)    a. 09/2013 endoscopy nl  . Morbid obesity (HCC)   . Osteoarthritis   . Persistent atrial fibrillation Chads2vascscore of at least 4(02/12/15)   a. Dx 09/2013 b. s/p PVI by Dr Johney Frame 11-26-2013   Past Surgical History:  Procedure Laterality Date  . ABLATION  11-26-2013   PVI by Dr Johney Frame  . arthroscopy       left knee    . ATRIAL FIBRILLATION ABLATION N/A 11/26/2013   Procedure: ATRIAL FIBRILLATION ABLATION;  Surgeon: Gardiner Rhyme, MD;  Location: [facility]  CATH LAB;  Service: Cardiovascular;  Laterality: N/A;  . CARDIOVERSION N/A 10/01/2013   Procedure: CARDIOVERSION;  Surgeon: Laurey Morale, MD;  Location: [facility] ENDOSCOPY;  Service: Cardiovascular;  Laterality: N/A;  . CARDIOVERSION N/A 10/17/2013   Procedure: CARDIOVERSION;  Surgeon: Laurey Morale, MD;  Location: [facility] ENDOSCOPY;  Service: Cardiovascular;  Laterality: N/A;  . CARDIOVERSION N/A 02/20/2019   Procedure: CARDIOVERSION;  Surgeon: Laurey Morale, MD;  Location: [facility] ENDOSCOPY;  Service: Cardiovascular;  Laterality: N/A;  . ESOPHAGOGASTRODUODENOSCOPY N/A 09/25/2013   Procedure: ESOPHAGOGASTRODUODENOSCOPY (EGD);  Surgeon: Graylin Shiver, MD;  Location: [facility] ENDOSCOPY;  Service: Endoscopy;  Laterality: N/A;  . fx left wrist     open reduction & internal fixaction  . LEFT HEART CATHETERIZATION WITH CORONARY ANGIOGRAM  09/26/2013   Procedure: LEFT HEART CATHETERIZATION WITH CORONARY ANGIOGRAM;  Surgeon: Laurey Morale, MD;  Location: [facility] CATH LAB;  Service: Cardiovascular;;  . TEE WITHOUT CARDIOVERSION N/A 10/01/2013   Procedure: TRANSESOPHAGEAL ECHOCARDIOGRAM (TEE);  Surgeon: Laurey Morale, MD;  Location: [facility] ENDOSCOPY;  Service: Cardiovascular;  Laterality: N/A;  . TEE WITHOUT CARDIOVERSION N/A 11/26/2013   Procedure: TRANSESOPHAGEAL ECHOCARDIOGRAM (TEE);  Surgeon: Laurey Morale, MD;  Location: [facility] ENDOSCOPY;  Service: Cardiovascular;  Laterality: N/A;    Current Outpatient Medications  Medication Sig Dispense Refill  . apixaban (ELIQUIS) 5 MG TABS tablet Take 1 tablet (5 mg total) by mouth 2 (two) times daily. 180 tablet 3  .  carvedilol (COREG) 12.5 MG tablet TAKE 1 TABLET BY MOUTH TWICE DAILY WITH A MEAL 180 tablet 3  . famotidine (PEPCID) 20 MG tablet Take 1 tablet (20 mg total) by mouth 2 (two) times daily. 60 tablet 3  . losartan (COZAAR) 25 MG tablet Take 1 tablet (25 mg total) by mouth 2 (two) times daily. Please call for Office visit [phone] 180 tablet 2  . Multiple  Vitamins-Minerals (MULTIVITAMIN WITH MINERALS) tablet Take 1 tablet by mouth daily.    Marland Kitchen spironolactone (ALDACTONE) 25 MG tablet TAKE 1 TABLET BY MOUTH EVERY DAY NEED OFFICE VISIT 90 tablet 1   No current facility-administered medications for this encounter.     No Known Allergies  Social History   Socioeconomic History  . Marital status: Married    Spouse name: Not on file  . Number of children: Not on file  . Years of education: Not on file  . Highest education level: Not on file  Occupational History  . Not on file  Social Needs  . Financial resource strain: Not on file  . Food insecurity:    Worry: Not on file    Inability: Not on file  . Transportation needs:    Medical: Not on file    Non-medical: Not on file  Tobacco Use  . Smoking status: Never Smoker  . Smokeless tobacco: Never Used  Substance and Sexual Activity  . Alcohol use: No  . Drug use: No  . Sexual activity: Yes    Birth control/protection: Post-menopausal  Lifestyle  . Physical activity:    Days per week: Not on file    Minutes per session: Not on file  . Stress: Not on file  Relationships  . Social connections:    Talks on phone: Not on file    Gets together: Not on file    Attends religious service: Not on file    Active member of club or organization: Not on file    Attends meetings of clubs or organizations: Not on file    Relationship status: Not on file  . Intimate partner violence:    Fear of current or ex partner: Not on file    Emotionally abused: Not on file    Physically abused: Not on file    Forced sexual activity: Not on file  Other Topics Concern  . Not on file  Social History Narrative   Lives in [place] with husband.  Does not routinely exercise.   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 Heart attack Father         multiple MI's  . Healthy Mother   . Healthy Brother   . Healthy Sister   . Healthy Sister     ROS- All systems are  reviewed and negative except as per the HPI above  Physical Exam: Vitals:   02/28/19 1343  BP: 118/78  Pulse: 71  Weight: 115.2 kg  Height: 5\' 3"  (1.6 m)   Wt Readings from Last 3 Encounters:  02/28/19 115.2 kg  02/20/19 108.4 kg  02/06/19 115.2 kg    Labs: Lab Results  Component Value Date   NA 137 02/06/2019   K 5.0 02/06/2019   CL 104 02/06/2019   CO2 25 02/06/2019   GLUCOSE 175 (H) 02/06/2019   BUN 34 (H) 02/06/2019   CREATININE 1.60 (H) 02/06/2019   CALCIUM 9.6 02/06/2019   MG 1.8 09/26/2013   Lab Results  Component Value Date  INR 1.09 09/23/2013   Lab Results  Component Value Date   CHOL 213 (H) 09/30/2015   HDL 56 09/30/2015   LDLCALC 127 (H) 09/30/2015   TRIG 150 (H) 09/30/2015     GEN- The patient is well appearing obese female, alert and oriented x 3 today.   HEENT-head normocephalic, atraumatic, sclera clear, conjunctiva pink, hearing intact, trachea midline. Lungs- Clear to ausculation bilaterally, normal work of breathing Heart- Regular rate and rhythm, occasional ectopic beats, no murmurs, rubs or gallops  GI- soft, NT, ND, + BS Extremities- no clubbing, cyanosis, or edema MS- no significant deformity or atrophy Skin- no rash or lesion Psych- euthymic mood, full affect Neuro- strength and sensation are intact   EKG- SR HR 71, PVCs, LAFB, slow R wave prog, PR 192, QRS 100, QTc 454  Echo scheduled for 03/08/19   Assessment and Plan: 1. Persistent  afib/flutter  S/p DCCV 02/20/19. In SR today. Patient reports that she has more energy and less fatigue since her cardioversion. We did discuss that if she has return of her afib, we may need to consider AAD such as Tikosyn. Patient voices understanding. She reports no missed doses of anticoagulation. Continue Coreg 12.5 mg BID Continue Eliquis 5 mg BID  2. Nonischemic CM Followed by Dr Shirlee Latch. Repeat echo pending. Weight stable Salt avoidence encouraged.  3. OSA Patient is not on CPAP and  not interested at this time.   Follow up with Dr Shirlee Latch and Dr Johney Frame as scheduled.  Jorja Loa PA-C [facility] [address] [place], [place] [address] [phone]

## 2019-03-06 ENCOUNTER — Other Ambulatory Visit (HOSPITAL_COMMUNITY): Payer: Self-pay

## 2019-03-06 MED ORDER — FAMOTIDINE 20 MG PO TABS: 20.0000 mg | ORAL_TABLET | Freq: Two times a day (BID) | ORAL | 3 refills | Status: DC

## 2019-03-08 ENCOUNTER — Telehealth: Payer: Self-pay | Admitting: Cardiology

## 2019-03-08 ENCOUNTER — Ambulatory Visit (HOSPITAL_COMMUNITY): Payer: Medicare Other

## 2019-03-08 NOTE — Telephone Encounter (Signed)
    Echocardiogram reschedule, greater than 12 weeks. Coronavirus  Dr. Shirlee Latch- prior EF 45 to 50%, paroxysmal atrial fibrillation  Spoke to patient.  She is feeling well, much more energy as was described in last office encounter.  No chest pain no shortness of breath no fevers no cough.  She was appreciative of phone call.  We will postpone echocardiogram.  Donato Schultz, MD

## 2019-04-19 ENCOUNTER — Encounter (HOSPITAL_COMMUNITY): Payer: Medicare Other | Admitting: Cardiology

## 2019-04-21 ENCOUNTER — Other Ambulatory Visit (HOSPITAL_COMMUNITY): Payer: Self-pay | Admitting: Cardiology

## 2019-06-01 ENCOUNTER — Other Ambulatory Visit (HOSPITAL_COMMUNITY): Payer: Self-pay | Admitting: Cardiology

## 2019-06-13 ENCOUNTER — Ambulatory Visit (HOSPITAL_COMMUNITY): Payer: Medicare Other

## 2019-07-24 ENCOUNTER — Ambulatory Visit (HOSPITAL_COMMUNITY): Payer: Medicare Other

## 2019-07-24 ENCOUNTER — Other Ambulatory Visit (HOSPITAL_COMMUNITY): Payer: Self-pay | Admitting: Cardiology

## 2019-09-16 ENCOUNTER — Other Ambulatory Visit (HOSPITAL_COMMUNITY): Payer: Self-pay | Admitting: *Deleted

## 2019-09-16 DIAGNOSIS — I4891 Unspecified atrial fibrillation: Secondary | ICD-10-CM

## 2019-09-23 ENCOUNTER — Other Ambulatory Visit (HOSPITAL_COMMUNITY): Payer: Self-pay | Admitting: Cardiology

## 2019-10-31 ENCOUNTER — Other Ambulatory Visit: Payer: Self-pay

## 2019-10-31 ENCOUNTER — Ambulatory Visit (HOSPITAL_COMMUNITY)
Admission: RE | Admit: 2019-10-31 | Discharge: 2019-10-31 | Disposition: A | Discharge status: Home / Self Care | Payer: Medicare Other | Source: Ambulatory Visit | Attending: Cardiology | Admitting: Cardiology

## 2019-10-31 DIAGNOSIS — I4891 Unspecified atrial fibrillation: Secondary | ICD-10-CM

## 2019-10-31 DIAGNOSIS — I083 Combined rheumatic disorders of mitral, aortic and tricuspid valves: Secondary | ICD-10-CM | POA: Diagnosis not present

## 2019-10-31 NOTE — Progress Notes (Signed)
  Echocardiogram 2D Echocardiogram has been performed.  Burnett Kanaris 10/31/2019, 2:32 PM

## 2019-12-03 ENCOUNTER — Other Ambulatory Visit (HOSPITAL_COMMUNITY): Payer: Self-pay | Admitting: Cardiology

## 2019-12-13 ENCOUNTER — Other Ambulatory Visit (HOSPITAL_COMMUNITY): Payer: Self-pay | Admitting: Cardiology

## 2019-12-19 ENCOUNTER — Other Ambulatory Visit (HOSPITAL_COMMUNITY): Payer: Self-pay | Admitting: Cardiology

## 2019-12-24 ENCOUNTER — Other Ambulatory Visit (HOSPITAL_COMMUNITY): Payer: Self-pay | Admitting: *Deleted

## 2019-12-24 MED ORDER — APIXABAN 5 MG PO TABS: 5.0000 mg | ORAL_TABLET | Freq: Two times a day (BID) | ORAL | 0 refills | Status: DC

## 2020-01-10 ENCOUNTER — Other Ambulatory Visit (HOSPITAL_COMMUNITY): Payer: Self-pay | Admitting: Cardiology

## 2020-02-01 ENCOUNTER — Other Ambulatory Visit (HOSPITAL_COMMUNITY): Payer: Self-pay | Admitting: Cardiology

## 2020-02-26 ENCOUNTER — Other Ambulatory Visit (HOSPITAL_COMMUNITY): Payer: Self-pay | Admitting: Cardiology

## 2020-03-15 ENCOUNTER — Other Ambulatory Visit (HOSPITAL_COMMUNITY): Payer: Self-pay | Admitting: Cardiology

## 2020-03-24 ENCOUNTER — Other Ambulatory Visit (HOSPITAL_COMMUNITY): Payer: Self-pay | Admitting: Cardiology

## 2020-04-07 ENCOUNTER — Other Ambulatory Visit (HOSPITAL_COMMUNITY): Payer: Self-pay | Admitting: Cardiology

## 2020-04-25 ENCOUNTER — Other Ambulatory Visit (HOSPITAL_COMMUNITY): Payer: Self-pay | Admitting: Cardiology

## 2020-05-02 ENCOUNTER — Other Ambulatory Visit (HOSPITAL_COMMUNITY): Payer: Self-pay | Admitting: Cardiology

## 2020-05-19 ENCOUNTER — Other Ambulatory Visit (HOSPITAL_COMMUNITY): Payer: Self-pay | Admitting: Cardiology

## 2020-06-04 ENCOUNTER — Other Ambulatory Visit (HOSPITAL_COMMUNITY): Payer: Self-pay | Admitting: Cardiology

## 2020-06-17 ENCOUNTER — Other Ambulatory Visit (HOSPITAL_COMMUNITY): Payer: Self-pay | Admitting: Cardiology

## 2020-06-23 ENCOUNTER — Encounter (HOSPITAL_COMMUNITY): Payer: Medicare Other | Admitting: Cardiology

## 2020-07-17 ENCOUNTER — Other Ambulatory Visit: Payer: Self-pay

## 2020-07-17 ENCOUNTER — Ambulatory Visit (HOSPITAL_COMMUNITY)
Admission: RE | Admit: 2020-07-17 | Discharge: 2020-07-17 | Disposition: A | Discharge status: Home / Self Care | Payer: Medicare Other | Source: Ambulatory Visit | Attending: Cardiology | Admitting: Cardiology

## 2020-07-17 ENCOUNTER — Encounter (HOSPITAL_COMMUNITY): Payer: Self-pay | Admitting: Cardiology

## 2020-07-17 VITALS — BP 120/84 | HR 66 | Wt 264.2 lb

## 2020-07-17 DIAGNOSIS — Z7984 Long term (current) use of oral hypoglycemic drugs: Secondary | ICD-10-CM | POA: Insufficient documentation

## 2020-07-17 DIAGNOSIS — E669 Obesity, unspecified: Secondary | ICD-10-CM | POA: Diagnosis not present

## 2020-07-17 DIAGNOSIS — I4891 Unspecified atrial fibrillation: Secondary | ICD-10-CM

## 2020-07-17 DIAGNOSIS — I5022 Chronic systolic (congestive) heart failure: Secondary | ICD-10-CM | POA: Diagnosis present

## 2020-07-17 DIAGNOSIS — K219 Gastro-esophageal reflux disease without esophagitis: Secondary | ICD-10-CM | POA: Insufficient documentation

## 2020-07-17 DIAGNOSIS — I480 Paroxysmal atrial fibrillation: Secondary | ICD-10-CM | POA: Insufficient documentation

## 2020-07-17 DIAGNOSIS — N183 Chronic kidney disease, stage 3 unspecified: Secondary | ICD-10-CM | POA: Diagnosis not present

## 2020-07-17 DIAGNOSIS — I2510 Atherosclerotic heart disease of native coronary artery without angina pectoris: Secondary | ICD-10-CM | POA: Diagnosis not present

## 2020-07-17 DIAGNOSIS — I428 Other cardiomyopathies: Secondary | ICD-10-CM | POA: Diagnosis not present

## 2020-07-17 DIAGNOSIS — Z7901 Long term (current) use of anticoagulants: Secondary | ICD-10-CM | POA: Diagnosis not present

## 2020-07-17 DIAGNOSIS — M1990 Unspecified osteoarthritis, unspecified site: Secondary | ICD-10-CM | POA: Insufficient documentation

## 2020-07-17 DIAGNOSIS — G4733 Obstructive sleep apnea (adult) (pediatric): Secondary | ICD-10-CM | POA: Insufficient documentation

## 2020-07-17 DIAGNOSIS — Z79899 Other long term (current) drug therapy: Secondary | ICD-10-CM | POA: Diagnosis not present

## 2020-07-17 LAB — BASIC METABOLIC PANEL
Anion gap: 10 (ref 5–15)
BUN: 25 mg/dL — ABNORMAL HIGH (ref 8–23)
CO2: 24 mmol/L (ref 22–32)
Calcium: 9.5 mg/dL (ref 8.9–10.3)
Chloride: 105 mmol/L (ref 98–111)
Creatinine, Ser: 1.48 mg/dL — ABNORMAL HIGH (ref 0.44–1.00)
GFR calc Af Amer: 40 mL/min — ABNORMAL LOW (ref >60)
GFR calc non Af Amer: 35 mL/min — ABNORMAL LOW (ref >60)
Glucose, Bld: 144 mg/dL — ABNORMAL HIGH (ref 70–99)
Potassium: 5.1 mmol/L (ref 3.5–5.1)
Sodium: 139 mmol/L (ref 135–145)

## 2020-07-17 LAB — CBC
HCT: 33.8 % — ABNORMAL LOW (ref 36.0–46.0)
Hemoglobin: 10.2 g/dL — ABNORMAL LOW (ref 12.0–15.0)
MCH: 25.7 pg — ABNORMAL LOW (ref 26.0–34.0)
MCHC: 30.2 g/dL (ref 30.0–36.0)
MCV: 85.1 fL (ref 80.0–100.0)
Platelets: 430 10*3/uL — ABNORMAL HIGH (ref 150–400)
RBC: 3.97 MIL/uL (ref 3.87–5.11)
RDW: 14 % (ref 11.5–15.5)
WBC: 6.6 10*3/uL (ref 4.0–10.5)
nRBC: 0 % (ref 0.0–0.2)

## 2020-07-17 MED ORDER — DAPAGLIFLOZIN PROPANEDIOL 10 MG PO TABS: 10.0000 mg | ORAL_TABLET | Freq: Every day | ORAL | 11 refills | Status: DC

## 2020-07-17 NOTE — Patient Instructions (Signed)
START Farxiga 10 mg, one tab daily  Labs today We will only contact you if something comes back abnormal or we need to make some changes. Otherwise no news is good news!  Labs needed in 10 days  Your physician has requested that you have a cardiac MRI. Cardiac MRI uses a computer to create images of your heart as its beating, producing both still and moving pictures of your heart and major blood vessels. For further information please visit [email or web address]. Please follow the instruction sheet given to you today for more information.  Your physician recommends that you schedule a follow-up appointment in: 4 months with Dr Shirlee Latch  Do the following things EVERYDAY: 1) Weigh yourself in the morning before breakfast. Write it down and keep it in a log. 2) Take your medicines as prescribed 3) Eat low salt foods--Limit salt (sodium) to 2000 mg per day.  4) Stay as active as you can everyday 5) Limit all fluids for the day to less than 2 liters  If you have any questions or concerns before your next appointment please send [place] a message through [place] or call our office at [phone].    TO LEAVE A MESSAGE FOR THE NURSE SELECT OPTION 2, PLEASE LEAVE A MESSAGE INCLUDING:  YOUR NAME  DATE OF BIRTH  CALL BACK NUMBER  REASON FOR CALL**this is important as we prioritize the call backs  YOU WILL RECEIVE A CALL BACK THE SAME DAY AS LONG AS YOU CALL BEFORE 4:00 PM

## 2020-07-19 NOTE — Progress Notes (Signed)
Patient ID: Kelli Webster, female   DOB: [birth year], 74 y.o.   MRN: [id number] PCP: Dr. Andrey Campanile EP: Dr Johney Frame HF MD: Dr Shirlee Latch.   74 y.o. woman with h/o obesity, PAF, moderate OSA (cannot tolerate CPAP) and systolic HF.  She was admitted in 09/2013 with severe dyspnea for several days and was found to have atrial fibrillation with RVR and volume overload.  EF 20-25% on echo, diffuse hypokinesis.   She was cardioverted twice but both times went back into atrial fibrillation, the 2nd time on amiodarone.    She had an atrial fibrillation ablation in 12/14 that was successful.  TEE in 12/14 showed EF 30%.  Repeat echo in 4/15 showed EF up to 45%.  Echo in 6/17 showed EF down to 25-30%.  Echo in 6/18 showed EF back up to 45-50%.  In 3/20 she had recurrent atrial fibrillation and was cardioverted.  In 11/20, echo showed EF 40-45% with normal RV.   She returns for followup of CHF.  Weight is up about 8 lbs.  She has no dyspnea walking at a steady pace on flat ground.  Dyspnea with stairs and fast walking.  No orthopnea/PND.  No chest pain.  She goes to the gym and uses elliptical.  She has sciatica pain.   ECG (personally reviewed): NSR, left axis deviation, septal Qs.   Labs (4/15): K 5.2 creatinine 1.6, digoxin 1.0  Labs (11/15): K 5.1, creatinine 1.4 Labs 09/2015: K 4.6 Creatinine 1.53  Labs (8/17): K 4.8, creatinine 1.53, hgb 11.6 Labs (10/17): K 4.7, creatinine 1.37, BNP 74 Labs (2/18): K 4.9, creatinine 1.47, hgb 11.5 Labs (7/18): hgb 11.6, K 4.5, creatinine 1.5 Labs (2/20): K 5, creatinine 1.6   PMH:  1. Obesity 2. GERD 3. OA 4. EGD negative in 10/14 5. ACEI cough 6. Atrial fibrillation: First diagnosed in 10/14.  DCCV to NSR in 09/2013 but atrial fibrillation recurred. Cardioversion 10/18/2013 successful on amiodarone but back in atrial fibrillation in 11/14.  Atrial fibrillation ablation ([facility]) 12/14.  - DCCV to NSR in 3/20.  7. Cardiomyopathy: Echo (10/14) with EF 20-25%, diffuse HK.   LHC (10/14) showed no CAD.  TSH normal, HIV negative, SPEP/UPEP negative.  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  TEE (12/14) with EF 30%, diffuse hypokinesis, mild MR, mildly decreased RV systolic function.  Echo (4/15) with EF 45%, diffuse hypokinesis, mildly dilated LV, normal RV size and systolic function.  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 - Echo (6/17) with EF 25-30%, mild MR.  - Echo (6/18) with EF 45-50%, mildly dilated LV, mild MR.  - Echo (11/20) with EF 40-45%, normal RV.  8. Sinus bradycardia 9. OSA: Cannot tolerate CPAP. 10. CKD stage 3 11. Low back pain/sciatica.   SH: Married, lives in [place], nonsmoker.   FH: No history of cardiomyopathy or premature CAD.   ROS: All systems reviewed and negative except as per HPI.   Current Outpatient Medications  Medication Sig Dispense Refill  . apixaban (ELIQUIS) 5 MG TABS tablet Take 1 tablet (5 mg total) by mouth 2 (two) times daily. Needs appt for further refills 180 tablet 0  . carvedilol (COREG) 12.5 MG tablet Take 1 tablet (12.5 mg total) by mouth 2 (two) times daily with a meal. Must keep pending appt for further refills 60 tablet 2  . dapagliflozin propanediol (FARXIGA) 10 MG TABS tablet Take 1 tablet (10 mg total) by mouth daily before breakfast. 30 tablet 11  . famotidine (PEPCID) 20 MG tablet Take 1 tablet (20 mg  total) by mouth 2 (two) times daily. Must keep pending appointment for further refills (Patient taking differently: Take 20 mg by mouth 2 (two) times daily as needed for heartburn or indigestion. Must keep pending appointment for further refills) 180 tablet 0  . losartan (COZAAR) 25 MG tablet Take 1 tablet (25 mg total) by mouth 2 (two) times daily. 60 tablet 2  . Multiple Vitamins-Minerals (MULTIVITAMIN WITH MINERALS) tablet Take 1 tablet by mouth daily.    Marland Kitchen spironolactone (ALDACTONE) 25 MG tablet Take 1 tablet (25 mg total) by mouth daily. Must keep appointment for further refills 90 tablet 0   No current  facility-administered medications for this encounter.    Vitals:   07/17/20 1337  BP: 120/84  Pulse: 66  SpO2: 97%  Weight: (!) 119.8 kg (264 lb 3.2 oz)   General: NAD Neck: JVP 8 cm with HJR, no thyromegaly or thyroid nodule.  Lungs: Clear to auscultation bilaterally with normal respiratory effort. CV: Nondisplaced PMI.  Heart regular S1/S2, no S3/S4, no murmur.  No peripheral edema.  No carotid bruit.  Normal pedal pulses.  Abdomen: Soft, nontender, no hepatosplenomegaly, no distention.  Skin: Intact without lesions or rashes.  Neurologic: Alert and oriented x 3.  Psych: Normal affect. Extremities: No clubbing or cyanosis.  HEENT: Normal.   Assessment/Plan: 1. 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 Echo 05/2016 EF 25-30%, most recent Echo 9/20 with EF 40-45%.  Suspect mild volume overload, NYHA class II symptoms.  - She was unable to tolerate Entresto due to lightheadedness.  - Continue losartan 25 mg BID. We have tried to increase this in the past to 25 mg in the am and 50mg  in the pm but she did not tolerate it.  BMET today.  - Continue current Coreg and spironolactone.  - Start dapagliflozin 10 mg daily with BMET in 10 days. This should give some diuresis.   - Cardiac MRI to assess for infiltrative disease.   2. Atrial fibrillation: Paroxysmal.  She is in NSR today.  - No longer on Amiodarone.  - Continue apixaban. CBC today.  3. OSA: She has been diagnosed in the past but cannot tolerate CPAP.   Follow up in 4 months.   07/19/2020

## 2020-07-20 ENCOUNTER — Other Ambulatory Visit (HOSPITAL_COMMUNITY): Payer: Self-pay | Admitting: Cardiology

## 2020-07-24 ENCOUNTER — Encounter (HOSPITAL_COMMUNITY): Payer: Self-pay

## 2020-07-27 ENCOUNTER — Other Ambulatory Visit (HOSPITAL_COMMUNITY): Payer: Medicare Other

## 2020-07-27 ENCOUNTER — Other Ambulatory Visit (HOSPITAL_COMMUNITY): Payer: Self-pay | Admitting: Cardiology

## 2020-07-31 ENCOUNTER — Other Ambulatory Visit: Payer: Self-pay

## 2020-07-31 ENCOUNTER — Ambulatory Visit (HOSPITAL_COMMUNITY)
Admission: RE | Admit: 2020-07-31 | Discharge: 2020-07-31 | Disposition: A | Discharge status: Home / Self Care | Payer: Medicare Other | Source: Ambulatory Visit | Attending: Cardiology | Admitting: Cardiology

## 2020-07-31 DIAGNOSIS — I5022 Chronic systolic (congestive) heart failure: Secondary | ICD-10-CM | POA: Diagnosis not present

## 2020-07-31 LAB — BASIC METABOLIC PANEL
Anion gap: 10 (ref 5–15)
BUN: 32 mg/dL — ABNORMAL HIGH (ref 8–23)
CO2: 23 mmol/L (ref 22–32)
Calcium: 9.3 mg/dL (ref 8.9–10.3)
Chloride: 104 mmol/L (ref 98–111)
Creatinine, Ser: 1.59 mg/dL — ABNORMAL HIGH (ref 0.44–1.00)
GFR calc Af Amer: 37 mL/min — ABNORMAL LOW (ref >60)
GFR calc non Af Amer: 32 mL/min — ABNORMAL LOW (ref >60)
Glucose, Bld: 124 mg/dL — ABNORMAL HIGH (ref 70–99)
Potassium: 5.1 mmol/L (ref 3.5–5.1)
Sodium: 137 mmol/L (ref 135–145)

## 2020-08-03 ENCOUNTER — Telehealth (HOSPITAL_COMMUNITY): Payer: Self-pay

## 2020-08-03 NOTE — Telephone Encounter (Signed)
error 

## 2020-08-03 NOTE — Progress Notes (Signed)
She has Medicare Part D insurance. Kelli Webster copay is $142 and Jardiance copay is $146. I would recommend starting Farxiga 10 mg daily. We can give her a 30 day free card and then start patient assistance paperwork. Will also add her to [facility] waitlist. [name], can you reach out to patient and start paperwork? Thanks.

## 2020-08-04 ENCOUNTER — Telehealth (HOSPITAL_COMMUNITY): Payer: Self-pay | Admitting: Pharmacy Technician

## 2020-08-04 NOTE — Telephone Encounter (Addendum)
The patient is starting [place] 10mg . The co-pay for her at this time is about $140. I started an application for assistance through [employer] for her. When I spoke with the patient, she had some concerns on how this medication will affect her once she starts it. Spoke with , [facility] about the patient's concerns. She is going to reach out to the patient and then we will proceed from there.  Will follow up.

## 2020-08-24 ENCOUNTER — Other Ambulatory Visit (HOSPITAL_COMMUNITY): Payer: Self-pay | Admitting: Cardiology

## 2020-08-27 ENCOUNTER — Other Ambulatory Visit (HOSPITAL_COMMUNITY): Payer: Self-pay | Admitting: Cardiology

## 2020-09-10 ENCOUNTER — Other Ambulatory Visit (HOSPITAL_COMMUNITY): Payer: Self-pay | Admitting: Cardiology

## 2020-10-27 ENCOUNTER — Telehealth (HOSPITAL_COMMUNITY): Payer: Self-pay | Admitting: *Deleted

## 2020-10-27 NOTE — Telephone Encounter (Signed)
She can come in for ecg but does not need to delay mri.

## 2020-10-27 NOTE — Telephone Encounter (Signed)
Pt left VM requesting a call back before she schedules her CMRI. Pt has questions about her heart rate. Pt said her heart rate is normally in the 60s at rest and in the 70s when she is active. Her heart rate resting is now in the 70s and in the 80s while active. Pt said she feels a little more tired after activity and wants to know if she needs to come in for an ekg or office visit before CMRI.  Routed to Dr.McLean for advice

## 2020-10-28 NOTE — Telephone Encounter (Signed)
Pt aware and agreeable with plan . Nurse visit scheduled for EKG.

## 2020-10-30 ENCOUNTER — Other Ambulatory Visit: Payer: Self-pay

## 2020-10-30 ENCOUNTER — Ambulatory Visit (HOSPITAL_COMMUNITY)
Admission: RE | Admit: 2020-10-30 | Discharge: 2020-10-30 | Disposition: A | Discharge status: Home / Self Care | Payer: Medicare Other | Source: Ambulatory Visit | Attending: Cardiology | Admitting: Cardiology

## 2020-10-30 VITALS — BP 108/66 | HR 73

## 2020-10-30 DIAGNOSIS — I4891 Unspecified atrial fibrillation: Secondary | ICD-10-CM | POA: Insufficient documentation

## 2020-10-30 MED ORDER — AMIODARONE HCL 200 MG PO TABS
ORAL_TABLET | ORAL | 3 refills | Status: DC
Start: 2020-10-30 — End: 2020-11-24

## 2020-10-30 NOTE — Progress Notes (Signed)
patient presents to office for EKG  Recent concerns regarding increased SOB with minimal exertion. Reports HR is now in the 80's  Vitals and ekg obtained   Per Dr Shirlee Latch She is back in atrial fibrillation. Would have her start amiodarone 200 mg bid x 10 days then 200 mg daily. ECG again Monday, if she is still in atrial fibrillation will need DCCV. No TEE as long as she has not missed Eliquis doses. Will need to get her back in with EP (can refer to afib clinic), ?redo afib ablation.   Pt aware of orders, reports compliance with Eliquis

## 2020-10-30 NOTE — Patient Instructions (Signed)
START Amiodarone 200 mg, one tab twice a day for 10 days then DECREASE to 200 mg, one tab daily  Return to office 11/02/2020 for repeat EKG  You have been referred to [facility] Address: [address] [place] [address] Phone: [phone] -they will be in contact with an appointment   If you have any questions or concerns before your next appointment please send [place] a message through [facility] or call our office at [phone].    TO LEAVE A MESSAGE FOR THE NURSE SELECT OPTION 2, PLEASE LEAVE A MESSAGE INCLUDING: . YOUR NAME . DATE OF BIRTH . CALL BACK NUMBER . REASON FOR CALL**this is important as we prioritize the call backs  YOU WILL RECEIVE A CALL BACK THE SAME DAY AS LONG AS YOU CALL BEFORE 4:00 PM

## 2020-11-02 ENCOUNTER — Ambulatory Visit (HOSPITAL_COMMUNITY)
Admission: RE | Admit: 2020-11-02 | Discharge: 2020-11-02 | Disposition: A | Discharge status: Home / Self Care | Payer: Medicare Other | Source: Ambulatory Visit | Attending: Cardiology | Admitting: Cardiology

## 2020-11-02 ENCOUNTER — Other Ambulatory Visit: Payer: Self-pay

## 2020-11-02 DIAGNOSIS — I4891 Unspecified atrial fibrillation: Secondary | ICD-10-CM | POA: Insufficient documentation

## 2020-11-02 NOTE — Patient Instructions (Addendum)
  Dear Kelli Webster  You are scheduled for a TEE/Cardioversion/TEE Cardioversion on Thursday December 2 ,2021 with Dr Shirlee Latch Please arrive at the [facility] (Main Entrance A) at [facility]: [address] [place], [place] [address] at 11:30 am  DIET: Nothing to eat or drink after midnight except a sip of water with medications (see medication instructions below)  Medication Instructions:   Continue your anticoagulant:Eliquis You will need to continue your anticoagulant after your procedure until you  are told by your  Provider that it is safe to stop   Labs: If patient is on Coumadin, patient needs pt/INR, CBC, BMET within 3 days (No pt/INR needed for patients taking Xarelto, Eliquis, Pradaxa) For patients receiving anesthesia for TEE and all Cardioversion patients: BMET, CBC within 1 week  Come to: pre procedure labs will be done before your procedure  You will need a pre procedure COVID test    WHEN:11/17/20  anytime between 9am-3pm WHERE: COVID Test Site [address] [place], [place] [address]  This is a drive thru testing site, you will remain in your car. Be sure to get in the line FOR PROCEDURES Once you have been swabbed you will need to remain home in quarantine until you return for your procedure.   You must have a responsible person to drive you home and stay in the waiting area during your procedure. Failure to do so could result in cancellation.  Bring your insurance cards.  *Special Note: Every effort is made to have your procedure done on time. Occasionally there are emergencies that occur at the hospital that may cause delays. Please be patient if a delay does occur.  \

## 2020-11-17 ENCOUNTER — Other Ambulatory Visit (HOSPITAL_COMMUNITY)
Admission: RE | Admit: 2020-11-17 | Discharge: 2020-11-17 | Disposition: A | Discharge status: Home / Self Care | Payer: Medicare Other | Source: Ambulatory Visit | Attending: Cardiology | Admitting: Cardiology

## 2020-11-17 ENCOUNTER — Encounter (HOSPITAL_COMMUNITY): Payer: Medicare Other | Admitting: Cardiology

## 2020-11-17 DIAGNOSIS — Z20822 Contact with and (suspected) exposure to covid-19: Secondary | ICD-10-CM | POA: Diagnosis not present

## 2020-11-17 DIAGNOSIS — Z01812 Encounter for preprocedural laboratory examination: Secondary | ICD-10-CM | POA: Diagnosis present

## 2020-11-17 LAB — SARS CORONAVIRUS 2 (TAT 6-24 HRS): SARS Coronavirus 2: NEGATIVE

## 2020-11-18 ENCOUNTER — Other Ambulatory Visit (HOSPITAL_COMMUNITY): Payer: Self-pay

## 2020-11-19 ENCOUNTER — Ambulatory Visit (HOSPITAL_COMMUNITY)
Admission: RE | Admit: 2020-11-19 | Discharge: 2020-11-19 | Disposition: A | Discharge status: Home / Self Care | Payer: Medicare Other | Attending: Cardiology | Admitting: Cardiology

## 2020-11-19 ENCOUNTER — Encounter (HOSPITAL_COMMUNITY)
Admission: RE | Disposition: A | Discharge status: Home / Self Care | Payer: Self-pay | Source: Home / Self Care | Attending: Cardiology

## 2020-11-19 ENCOUNTER — Encounter (HOSPITAL_COMMUNITY): Payer: Self-pay | Admitting: Cardiology

## 2020-11-19 ENCOUNTER — Ambulatory Visit (HOSPITAL_COMMUNITY): Payer: Medicare Other | Admitting: Certified Registered Nurse Anesthetist

## 2020-11-19 DIAGNOSIS — Z7901 Long term (current) use of anticoagulants: Secondary | ICD-10-CM | POA: Insufficient documentation

## 2020-11-19 DIAGNOSIS — I4891 Unspecified atrial fibrillation: Secondary | ICD-10-CM | POA: Insufficient documentation

## 2020-11-19 DIAGNOSIS — Z79899 Other long term (current) drug therapy: Secondary | ICD-10-CM | POA: Diagnosis not present

## 2020-11-19 HISTORY — PX: CARDIOVERSION: SHX1299

## 2020-11-19 LAB — POCT I-STAT, CHEM 8
BUN: 32 mg/dL — ABNORMAL HIGH (ref 8–23)
Calcium, Ion: 1.11 mmol/L — ABNORMAL LOW (ref 1.15–1.40)
Chloride: 105 mmol/L (ref 98–111)
Creatinine, Ser: 1.5 mg/dL — ABNORMAL HIGH (ref 0.44–1.00)
Glucose, Bld: 152 mg/dL — ABNORMAL HIGH (ref 70–99)
HCT: 38 % (ref 36.0–46.0)
Hemoglobin: 12.9 g/dL (ref 12.0–15.0)
Potassium: 4.8 mmol/L (ref 3.5–5.1)
Sodium: 137 mmol/L (ref 135–145)
TCO2: 23 mmol/L (ref 22–32)

## 2020-11-19 SURGERY — CARDIOVERSION
Anesthesia: General

## 2020-11-19 MED ORDER — PROPOFOL 10 MG/ML IV BOLUS
INTRAVENOUS | Status: DC | PRN
  Administered 2020-11-19: 10 mg via INTRAVENOUS
  Administered 2020-11-19: 40 mg via INTRAVENOUS

## 2020-11-19 MED ORDER — SODIUM CHLORIDE 0.9 % IV SOLN: INTRAVENOUS | Status: DC | PRN

## 2020-11-19 NOTE — Discharge Instructions (Signed)
Electrical Cardioversion Electrical cardioversion is the delivery of a jolt of electricity to restore a normal rhythm to the heart. A rhythm that is too fast or is not regular keeps the heart from pumping well. In this procedure, sticky patches or metal paddles are placed on the chest to deliver electricity to the heart from a device. This procedure may be done in an emergency if:  There is low or no blood pressure as a result of the heart rhythm.  Normal rhythm must be restored as fast as possible to protect the brain and heart from further damage.  It may save a life. This may also be a scheduled procedure for irregular or fast heart rhythms that are not immediately life-threatening. Tell a health care provider about:  Any allergies you have.  All medicines you are taking, including vitamins, herbs, eye drops, creams, and over-the-counter medicines.  Any problems you or family members have had with anesthetic medicines.  Any blood disorders you have.  Any surgeries you have had.  Any medical conditions you have.  Whether you are pregnant or may be pregnant. What are the risks? Generally, this is a safe procedure. However, problems may occur, including:  Allergic reactions to medicines.  A blood clot that breaks free and travels to other parts of your body.  The possible return of an abnormal heart rhythm within hours or days after the procedure.  Your heart stopping (cardiac arrest). This is rare.  What can I expect after the procedure?  Your blood pressure, heart rate, breathing rate, and blood oxygen level will be monitored until you leave the hospital or clinic.  Your heart rhythm will be watched to make sure it does not change.  You may have some redness on the skin where the shocks were given. Follow these instructions at home:  Do not drive for 24 hours if you were given a sedative during your procedure.  Take over-the-counter and prescription medicines only as  told by your health care provider.  Ask your health care provider how to check your pulse. Check it often.  Rest for 48 hours after the procedure or as told by your health care provider.  Avoid or limit your caffeine use as told by your health care provider.  Keep all follow-up visits as told by your health care provider. This is important. Contact a health care provider if:  You feel like your heart is beating too quickly or your pulse is not regular.  You have a serious muscle cramp that does not go away. Get help right away if:  You have discomfort in your chest.  You are dizzy or you feel faint.  You have trouble breathing or you are short of breath.  Your speech is slurred.  You have trouble moving an arm or leg on one side of your body.  Your fingers or toes turn cold or blue. Summary  Electrical cardioversion is the delivery of a jolt of electricity to restore a normal rhythm to the heart.  This procedure may be done right away in an emergency or may be a scheduled procedure if the condition is not an emergency.  Generally, this is a safe procedure.  After the procedure, check your pulse often as told by your health care provider. This information is not intended to replace advice given to you by your health care provider. Make sure you discuss any questions you have with your health care provider. Document Revised: 07/08/2019 Document Reviewed: 07/08/2019 Elsevier Patient  Education  2020 Elsevier Inc.  

## 2020-11-19 NOTE — Anesthesia Preprocedure Evaluation (Addendum)
Anesthesia Evaluation  Patient identified by MRN, date of birth, ID band Patient awake    Reviewed: Allergy & Precautions, NPO status , Patient's Chart, lab work & pertinent test results, reviewed documented beta blocker date and time   History of Anesthesia Complications Negative for: history of anesthetic complications  Airway Mallampati: III  TM Distance: >3 FB Neck ROM: Full    Dental  (+) Dental Advisory Given   Pulmonary sleep apnea , neg COPD,  Covid-19 Nucleic Acid Test Results Lab Results      Component                Value               Date                      SARSCOV2NAA              NEGATIVE            11/17/2020              breath sounds clear to auscultation       Cardiovascular +CHF  + dysrhythmias Atrial Fibrillation  Rhythm:Irregular  1. Left ventricular ejection fraction, by visual estimation, is 40 to  45%. The left ventricle has mild to moderately decreased function. There  is no left ventricular hypertrophy. Diffuse hypokinesis.  2. Left ventricular diastolic parameters are consistent with Grade I  diastolic dysfunction (impaired relaxation).  3. Global right ventricle has normal systolic function.The right  ventricular size is normal. No increase in right ventricular wall  thickness.  4. Left atrial size was normal.  5. Right atrial size was normal.  6. The mitral valve is normal in structure. Mild mitral valve  regurgitation.  7. The aortic valve is tricuspid. Aortic valve regurgitation is mild.  8. The tricuspid valve is normal in structure. Tricuspid valve  regurgitation is trivial.  9. The pulmonic valve was not well visualized. Pulmonic valve  regurgitation is trivial.  10. TR signal is inadequate for assessing pulmonary artery systolic  pressure.    Neuro/Psych negative neurological ROS  negative psych ROS   GI/Hepatic Neg liver ROS, GERD  ,  Endo/Other  Morbid obesityNo  results found for: HGBA1C   Renal/GU Renal InsufficiencyRenal diseaseLab Results      Component                Value               Date                      CREATININE               1.50 (H)            11/19/2020           Lab Results      Component                Value               Date                      K                        4.8                 11/19/2020  Musculoskeletal  (+) Arthritis ,   Abdominal   Peds  Hematology Lab Results      Component                Value               Date                      WBC                      6.6                 07/17/2020                HGB                      12.9                11/19/2020                HCT                      38.0                11/19/2020                MCV                      85.1                07/17/2020                PLT                      430 (H)             07/17/2020            eliquis for afib   Anesthesia Other Findings   Reproductive/Obstetrics                            Anesthesia Physical Anesthesia Plan  ASA: III  Anesthesia Plan: General   Post-op Pain Management:    Induction: Intravenous  PONV Risk Score and Plan: 3 and Treatment may vary due to age or medical condition and Propofol infusion  Airway Management Planned: Mask  Additional Equipment: None  Intra-op Plan:   Post-operative Plan: Extubation in OR  Informed Consent: I have reviewed the patients History and Physical, chart, labs and discussed the procedure including the risks, benefits and alternatives for the proposed anesthesia with the patient or authorized representative who has indicated his/her understanding and acceptance.     Dental advisory given  Plan Discussed with: CRNA and Anesthesiologist  Anesthesia Plan Comments:         Anesthesia Quick Evaluation

## 2020-11-19 NOTE — Anesthesia Postprocedure Evaluation (Signed)
Anesthesia Post Note  Patient: Kelli Webster  Procedure(s) Performed: CARDIOVERSION (N/A )     Patient location during evaluation: Endoscopy Anesthesia Type: General Level of consciousness: awake and alert Pain management: pain level controlled Vital Signs Assessment: post-procedure vital signs reviewed and stable Respiratory status: spontaneous breathing, nonlabored ventilation, respiratory function stable and patient connected to nasal cannula oxygen Cardiovascular status: stable Postop Assessment: no apparent nausea or vomiting Anesthetic complications: no   No complications documented.  Last Vitals:  Vitals:   11/19/20 1153 11/19/20 1203  BP: (!) 91/37 (!) 98/54  Pulse: (!) 56 (!) 53  Resp: 12 18  Temp: 37 C   SpO2: 95% 96%    Last Pain:  Vitals:   11/19/20 1203  TempSrc:   PainSc: 0-No pain                 Tiphany Fayson

## 2020-11-19 NOTE — Anesthesia Procedure Notes (Signed)
Procedure Name: General with mask airway Date/Time: 11/19/2020 11:28 AM Performed by: Nils Pyle, CRNA Pre-anesthesia Checklist: Patient identified, Emergency Drugs available, Suction available and Patient being monitored Patient Re-evaluated:Patient Re-evaluated prior to induction Oxygen Delivery Method: Ambu bag Preoxygenation: Pre-oxygenation with 100% oxygen Induction Type: IV induction Placement Confirmation: positive ETCO2 and breath sounds checked- equal and bilateral Dental Injury: Teeth and Oropharynx as per pre-operative assessment

## 2020-11-19 NOTE — H&P (Signed)
Advanced Heart Failure Team History and Physical Note   PCP:  Kelli Banner, MD  PCP-Cardiology: No primary care provider on file.     Reason for Admission: AF   HPI:    Patient remains in atrial fibrillation this morning.  She has missed no Eliquis and is taking amiodarone.     Review of Systems: [y] = yes, [ ]  = no   General: Weight gain [ ] ; Weight loss [ ] ; Anorexia [ ] ; Fatigue [ ] ; Fever [ ] ; Chills [ ] ; Weakness [ ]   Cardiac: Chest pain/pressure [ ] ; Resting SOB [ ] ; Exertional SOB [ ] ; Orthopnea [ ] ; Pedal Edema [ ] ; Palpitations [ ] ; Syncope [ ] ; Presyncope [ ] ; Paroxysmal nocturnal dyspnea[ ]   Pulmonary: Cough [ ] ; Wheezing[ ] ; Hemoptysis[ ] ; Sputum [ ] ; Snoring [ ]   GI: Vomiting[ ] ; Dysphagia[ ] ; Melena[ ] ; Hematochezia [ ] ; Heartburn[ ] ; Abdominal pain [ ] ; Constipation [ ] ; Diarrhea [ ] ; BRBPR [ ]   GU: Hematuria[ ] ; Dysuria [ ] ; Nocturia[ ]   Vascular: Pain in legs with walking [ ] ; Pain in feet with lying flat [ ] ; Non-healing sores [ ] ; Stroke [ ] ; TIA [ ] ; Slurred speech [ ] ;  Neuro: Headaches[ ] ; Vertigo[ ] ; Seizures[ ] ; Paresthesias[ ] ;Blurred vision [ ] ; Diplopia [ ] ; Vision changes [ ]   Ortho/Skin: Arthritis [ ] ; Joint pain [ ] ; Muscle pain [ ] ; Joint swelling [ ] ; Back Pain [ ] ; Rash [ ]   Psych: Depression[ ] ; Anxiety[ ]   Heme: Bleeding problems [ ] ; Clotting disorders [ ] ; Anemia [ ]   Endocrine: Diabetes [ ] ; Thyroid dysfunction[ ]    Home Medications Prior to Admission medications   Medication Sig Start Date End Date Taking? Authorizing Provider  amiodarone (PACERONE) 200 MG tablet Take 1 tablet (200 mg total) by mouth in the morning and at bedtime for 10 days, THEN 1 tablet (200 mg total) daily. Patient taking differently: Take 200 mg daily. 10/30/20 12/09/20 Yes , MD  carvedilol (COREG) 12.5 MG tablet Take 1 tablet (12.5 mg total) by mouth 2 (two) times daily with a meal. 07/27/20  Yes , MD  ELIQUIS 5 MG TABS tablet TAKE 1  TABLET BY MOUTH  TWICE DAILY Patient taking differently: Take 5 mg by mouth 2 (two) times daily.  08/26/20  Yes , MD  famotidine (PEPCID) 20 MG tablet TAKE 1 TABLET 2 TIMES DAILY. MUST KEEP PENDING APPOINTMENT Patient taking differently: Take 20 mg by mouth daily as needed for heartburn.  09/10/20  Yes , MD  losartan (COZAAR) 25 MG tablet TAKE 1 TABLET BY MOUTH TWICE A DAY Patient taking differently: Take 25 mg by mouth in the morning and at bedtime.  07/20/20  Yes , MD  Multiple Vitamins-Minerals (MULTIVITAMIN WITH MINERALS) tablet Take 1 tablet by mouth daily.   Yes [provider]  spironolactone (ALDACTONE) 25 MG tablet TAKE 1 TABLET (25 MG TOTAL) BY MOUTH DAILY. MUST KEEP APPOINTMENT FOR FURTHER REFILLS Patient taking differently: Take 25 mg by mouth daily.  08/28/20  Yes , MD  dapagliflozin propanediol (FARXIGA) 10 MG TABS tablet Take 1 tablet (10 mg total) by mouth daily before breakfast. Patient not taking: Reported on 11/13/2020 07/17/20   , MD    Past Medical History: Past Medical History:  Diagnosis Date  . Chronic systolic heart failure (HCC)    a. ECHO (09/2013) EF 20-25% diff HK, mild/mod  MR, LA mod dil, RV mildly dil, RA mildly dil b. R/LHC (09/26/13) AO 133/88, LV 137/17, Lmain no dz, LAD 30% prox, LCX mild luminal irreg; RCA mild luminal irreg  . GERD (gastroesophageal reflux disease)    a. 09/2013 endoscopy nl  . Morbid obesity (HCC)   . Osteoarthritis   . Persistent atrial fibrillation (HCC) Chads2vascscore of at least 4(02/12/15)   a. Dx 09/2013 b. s/p PVI by Dr Johney Frame 11-26-2013    Past Surgical History: Past Surgical History:  Procedure Laterality Date  . ABLATION  11-26-2013   PVI by Dr Johney Frame  . arthroscopy       left knee    . ATRIAL FIBRILLATION ABLATION N/A 11/26/2013   Procedure: ATRIAL FIBRILLATION ABLATION;  Surgeon: Gardiner Rhyme, MD;  Location: [facility] CATH LAB;  Service:  Cardiovascular;  Laterality: N/A;  . CARDIOVERSION N/A 10/01/2013   Procedure: CARDIOVERSION;  Surgeon: Laurey Morale, MD;  Location: [facility] ENDOSCOPY;  Service: Cardiovascular;  Laterality: N/A;  . CARDIOVERSION N/A 10/17/2013   Procedure: CARDIOVERSION;  Surgeon: Laurey Morale, MD;  Location: [facility] ENDOSCOPY;  Service: Cardiovascular;  Laterality: N/A;  . CARDIOVERSION N/A 02/20/2019   Procedure: CARDIOVERSION;  Surgeon: Laurey Morale, MD;  Location: [facility] ENDOSCOPY;  Service: Cardiovascular;  Laterality: N/A;  . ESOPHAGOGASTRODUODENOSCOPY N/A 09/25/2013   Procedure: ESOPHAGOGASTRODUODENOSCOPY (EGD);  Surgeon: Graylin Shiver, MD;  Location: [facility] ENDOSCOPY;  Service: Endoscopy;  Laterality: N/A;  . fx left wrist     open reduction & internal fixaction  . LEFT HEART CATHETERIZATION WITH CORONARY ANGIOGRAM  09/26/2013   Procedure: LEFT HEART CATHETERIZATION WITH CORONARY ANGIOGRAM;  Surgeon: Laurey Morale, MD;  Location: [facility] CATH LAB;  Service: Cardiovascular;;  . TEE WITHOUT CARDIOVERSION N/A 10/01/2013   Procedure: TRANSESOPHAGEAL ECHOCARDIOGRAM (TEE);  Surgeon: Laurey Morale, MD;  Location: [facility] ENDOSCOPY;  Service: Cardiovascular;  Laterality: N/A;  . TEE WITHOUT CARDIOVERSION N/A 11/26/2013   Procedure: TRANSESOPHAGEAL ECHOCARDIOGRAM (TEE);  Surgeon: Laurey Morale, MD;  Location: [facility] ENDOSCOPY;  Service: Cardiovascular;  Laterality: N/A;    Family History:  Family History  Problem Relation Age of Onset  . Heart attack Father         multiple MI's  . Healthy Mother   . Healthy Brother   . Healthy Sister   . Healthy Sister     Social History: Social History   Socioeconomic History  . Marital status: Married    Spouse name: Not on file  . Number of children: Not on file  . Years of education: Not on file  . Highest education level: Not on file  Occupational History  . Not on file  Tobacco Use  . Smoking status: Never Smoker  . Smokeless tobacco: Never Used  Vaping Use  . Vaping  Use: Never used  Substance and Sexual Activity  . Alcohol use: No  . Drug use: No  . Sexual activity: Yes    Birth control/protection: Post-menopausal  Other Topics Concern  . Not on file  Social History Narrative   Lives in [place] with husband.  Does not routinely exercise.   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 Difficulty of Paying Living Expenses: Not on file  Food Insecurity:   . Worried About Programme researcher, broadcasting/film/video in the Last Year: Not on file  . Ran Out of Food in the Last Year: Not on file  Transportation Needs:   . Lack of Transportation (  Medical): Not on file  . Lack of Transportation (Non-Medical): Not on file  Physical Activity:   . Days of Exercise per Week: Not on file  . Minutes of Exercise per Session: Not on file  Stress:   . Feeling of Stress : Not on file  Social Connections:   . Frequency of Communication with Friends and Family: Not on file  . Frequency of Social Gatherings with Friends and Family: Not on file  . Attends Religious Services: Not on file  . Active Member of Clubs or Organizations: Not on file  . Attends Banker Meetings: Not on file  . Marital Status: Not on file    Allergies:  No Known Allergies  Objective:    Vital Signs:   Temp:  [97 F (36.1 C)] 97 F (36.1 C) (12/02 1107) Pulse Rate:  [70] 70 (12/02 1107) Resp:  [21] 21 (12/02 1107) BP: (120)/(60) 120/60 (12/02 1107) SpO2:  [96 %] 96 % (12/02 1107) Weight:  [109.8 kg] 109.8 kg (12/02 1107)   Filed Weights   11/19/20 1107  Weight: 109.8 kg     Physical Exam     General:  Well appearing. No respiratory difficulty HEENT: Normal Neck: Supple. no JVD. Carotids 2+ bilat; no bruits. No lymphadenopathy or thyromegaly appreciated. Cor: PMI nondisplaced. Irregular rate & rhythm. No rubs, gallops or murmurs. Lungs: Clear Abdomen: Soft, nontender, nondistended. No hepatosplenomegaly. No bruits or masses. Good bowel  sounds. Extremities: No cyanosis, clubbing, rash, edema Neuro: Alert & oriented x 3, cranial nerves grossly intact. moves all 4 extremities w/o difficulty. Affect pleasant.   Telemetry   Atrial fibrillation 70s    Labs     Basic Metabolic Panel: Recent Labs  Lab 11/19/20 1110  NA 137  K 4.8  CL 105  GLUCOSE 152*  BUN 32*  CREATININE 1.50*    Liver Function Tests: No results for input(s): AST, ALT, ALKPHOS, BILITOT, PROT, ALBUMIN in the last 168 hours. No results for input(s): LIPASE, AMYLASE in the last 168 hours. No results for input(s): AMMONIA in the last 168 hours.  CBC: Recent Labs  Lab 11/19/20 1110  HGB 12.9  HCT 38.0    Cardiac Enzymes: No results for input(s): CKTOTAL, CKMB, CKMBINDEX, TROPONINI in the last 168 hours.  BNP: BNP (last 3 results) No results for input(s): BNP in the last 8760 hours.  ProBNP (last 3 results) No results for input(s): PROBNP in the last 8760 hours.   CBG: No results for input(s): GLUCAP in the last 168 hours.  Coagulation Studies: No results for input(s): LABPROT, INR in the last 72 hours.  Imaging:  No results found.    Assessment/Plan   DCCV planned for today.     [name], MD 11/19/2020, 11:44 AM  Advanced Heart Failure Team Pager [phone] (M-F; 7a - 4p)  Please contact [facility] for night-coverage after hours (4p -7a ) and weekends on [email or web address]

## 2020-11-19 NOTE — Transfer of Care (Signed)
Immediate Anesthesia Transfer of Care Note  Patient: Kelli Webster  Procedure(s) Performed: CARDIOVERSION (N/A )  Patient Location: Endoscopy Unit  Anesthesia Type:General  Level of Consciousness: awake and alert   Airway & Oxygen Therapy: Patient Spontanous Breathing  Post-op Assessment: Report given to RN and Post -op Vital signs reviewed and stable  Post vital signs: Reviewed and stable  Last Vitals:  Vitals Value Taken Time  BP    Temp    Pulse    Resp    SpO2      Last Pain:  Vitals:   11/19/20 1107  TempSrc: Tympanic  PainSc: 0-No pain         Complications: No complications documented.

## 2020-11-19 NOTE — Procedures (Signed)
Electrical Cardioversion Procedure Note Quana Chamberlain [id number] [birth year]  Procedure: Electrical Cardioversion Indications:  Atrial Fibrillation  Procedure Details Consent: Risks of procedure as well as the alternatives and risks of each were explained to the (patient/caregiver).  Consent for procedure obtained. Time Out: Verified patient identification, verified procedure, site/side was marked, verified correct patient position, special equipment/implants available, medications/allergies/relevent history reviewed, required imaging and test results available.  Performed  Patient placed on cardiac monitor, pulse oximetry, supplemental oxygen as necessary.  Sedation given: Propofol per anesthesiology Pacer pads placed anterior and posterior chest.  Cardioverted 1 time(s).  Cardioverted at 200J.  Evaluation Findings: Post procedure EKG shows: NSR Complications: None Patient did tolerate procedure well.   [name] 11/19/2020, 11:51 AM

## 2020-11-22 ENCOUNTER — Encounter (HOSPITAL_COMMUNITY): Payer: Self-pay | Admitting: Cardiology

## 2020-11-24 ENCOUNTER — Encounter (HOSPITAL_COMMUNITY): Payer: Self-pay | Admitting: Nurse Practitioner

## 2020-11-24 ENCOUNTER — Other Ambulatory Visit: Payer: Self-pay

## 2020-11-24 ENCOUNTER — Ambulatory Visit (HOSPITAL_COMMUNITY)
Admission: RE | Admit: 2020-11-24 | Discharge: 2020-11-24 | Disposition: A | Discharge status: Home / Self Care | Payer: Medicare Other | Source: Ambulatory Visit | Attending: Nurse Practitioner | Admitting: Nurse Practitioner

## 2020-11-24 VITALS — BP 136/84 | HR 84 | Ht 63.5 in | Wt 265.6 lb

## 2020-11-24 DIAGNOSIS — I4892 Unspecified atrial flutter: Secondary | ICD-10-CM | POA: Diagnosis not present

## 2020-11-24 DIAGNOSIS — Z79899 Other long term (current) drug therapy: Secondary | ICD-10-CM | POA: Diagnosis not present

## 2020-11-24 DIAGNOSIS — D6869 Other thrombophilia: Secondary | ICD-10-CM | POA: Diagnosis not present

## 2020-11-24 DIAGNOSIS — I4819 Other persistent atrial fibrillation: Secondary | ICD-10-CM | POA: Insufficient documentation

## 2020-11-24 DIAGNOSIS — Z7901 Long term (current) use of anticoagulants: Secondary | ICD-10-CM | POA: Diagnosis not present

## 2020-11-24 MED ORDER — AMIODARONE HCL 200 MG PO TABS: 200.0000 mg | ORAL_TABLET | Freq: Every day | ORAL | 3 refills | Status: DC

## 2020-11-24 NOTE — Progress Notes (Signed)
Primary Care Physician: Barbie Banner, MD Referring Physician: Dr. Johney Frame Cardiologist: Dr. Ramonita Lab Faerber is a 74 y.o. female with a h/o, HF,  Afib, s/p ablation 2014,on short term amiodarone at that time, that was seen by Dr. Johney Frame 01/21/19 and found to be in afib. She reported that she was under a lot of stress and noted an increase in heart rate then from the 60's to the 80's which has been consistent since that time. Dr. Johney Frame wanted her to have a cardioversion at his visit but she pushed back stating that they had not been successful in the past. Dr. Johney Frame also mentioned that admission for tikosyn was also an option. After much discussion, pt would like to proceed with cardioversion, not ready for a hospitlization for repeat abaltion, understanding that it might not be successful long term.   F/u in afib clinic, 11/24/20. Pt noted return to afib and contacted Dr. Shirlee Latch and was given a short load of amiodarone and set up for cardioversion. It was successful but she noted return to afib this am.  She is rate controlled. She  is not interested in another ablation at at his time. She  would like to load longer on amiodarone and try another cardioversion in several weeks.   Today, she denies symptoms of palpitations, chest pain, shortness of breath, orthopnea, PND, lower extremity edema, dizziness, presyncope, syncope, or neurologic sequela. The patient is tolerating medications without difficulties and is otherwise without complaint today.   Past Medical History:  Diagnosis Date  . Chronic systolic heart failure (HCC)    a. ECHO (09/2013) EF 20-25% diff HK, mild/mod MR, LA mod dil, RV mildly dil, RA mildly dil b. R/LHC (09/26/13) AO 133/88, LV 137/17, Lmain no dz, LAD 30% prox, LCX mild luminal irreg; RCA mild luminal irreg  . GERD (gastroesophageal reflux disease)    a. 09/2013 endoscopy nl  . Morbid obesity (HCC)   . Osteoarthritis   . Persistent atrial fibrillation (HCC)  Chads2vascscore of at least 4(02/12/15)   a. Dx 09/2013 b. s/p PVI by Dr Johney Frame 11-26-2013   Past Surgical History:  Procedure Laterality Date  . ABLATION  11-26-2013   PVI by Dr Johney Frame  . arthroscopy       left knee    . ATRIAL FIBRILLATION ABLATION N/A 11/26/2013   Procedure: ATRIAL FIBRILLATION ABLATION;  Surgeon: Gardiner Rhyme, MD;  Location: [facility] CATH LAB;  Service: Cardiovascular;  Laterality: N/A;  . CARDIOVERSION N/A 10/01/2013   Procedure: CARDIOVERSION;  Surgeon: Laurey Morale, MD;  Location: [facility] ENDOSCOPY;  Service: Cardiovascular;  Laterality: N/A;  . CARDIOVERSION N/A 10/17/2013   Procedure: CARDIOVERSION;  Surgeon: Laurey Morale, MD;  Location: [facility] ENDOSCOPY;  Service: Cardiovascular;  Laterality: N/A;  . CARDIOVERSION N/A 02/20/2019   Procedure: CARDIOVERSION;  Surgeon: Laurey Morale, MD;  Location: [facility] ENDOSCOPY;  Service: Cardiovascular;  Laterality: N/A;  . CARDIOVERSION N/A 11/19/2020   Procedure: CARDIOVERSION;  Surgeon: Laurey Morale, MD;  Location: [facility] ENDOSCOPY;  Service: Cardiovascular;  Laterality: N/A;  . ESOPHAGOGASTRODUODENOSCOPY N/A 09/25/2013   Procedure: ESOPHAGOGASTRODUODENOSCOPY (EGD);  Surgeon: Graylin Shiver, MD;  Location: [facility] ENDOSCOPY;  Service: Endoscopy;  Laterality: N/A;  . fx left wrist     open reduction & internal fixaction  . LEFT HEART CATHETERIZATION WITH CORONARY ANGIOGRAM  09/26/2013   Procedure: LEFT HEART CATHETERIZATION WITH CORONARY ANGIOGRAM;  Surgeon: Laurey Morale, MD;  Location: [facility] CATH LAB;  Service: Cardiovascular;;  . TEE  WITHOUT CARDIOVERSION N/A 10/01/2013   Procedure: TRANSESOPHAGEAL ECHOCARDIOGRAM (TEE);  Surgeon: Laurey Morale, MD;  Location: [facility] ENDOSCOPY;  Service: Cardiovascular;  Laterality: N/A;  . TEE WITHOUT CARDIOVERSION N/A 11/26/2013   Procedure: TRANSESOPHAGEAL ECHOCARDIOGRAM (TEE);  Surgeon: Laurey Morale, MD;  Location: [facility] ENDOSCOPY;  Service: Cardiovascular;  Laterality: N/A;    Current Outpatient Medications   Medication Sig Dispense Refill  . amiodarone (PACERONE) 200 MG tablet Take 1 tablet (200 mg total) by mouth daily. 60 tablet 3  . carvedilol (COREG) 12.5 MG tablet Take 1 tablet (12.5 mg total) by mouth 2 (two) times daily with a meal. 180 tablet 3  . ELIQUIS 5 MG TABS tablet TAKE 1 TABLET BY MOUTH  TWICE DAILY 180 tablet 3  . famotidine (PEPCID) 20 MG tablet TAKE 1 TABLET 2 TIMES DAILY. MUST KEEP PENDING APPOINTMENT 180 tablet 0  . losartan (COZAAR) 25 MG tablet TAKE 1 TABLET BY MOUTH TWICE A DAY 180 tablet 3  . Multiple Vitamins-Minerals (MULTIVITAMIN WITH MINERALS) tablet Take 1 tablet by mouth daily.    Marland Kitchen spironolactone (ALDACTONE) 25 MG tablet TAKE 1 TABLET (25 MG TOTAL) BY MOUTH DAILY. MUST KEEP APPOINTMENT FOR FURTHER REFILLS 90 tablet 0   No current facility-administered medications for this encounter.    No Known Allergies  Social History   Socioeconomic History  . Marital status: Married    Spouse name: Not on file  . Number of children: Not on file  . Years of education: Not on file  . Highest education level: Not on file  Occupational History  . Not on file  Tobacco Use  . Smoking status: Never Smoker  . Smokeless tobacco: Never Used  Vaping Use  . Vaping Use: Never used  Substance and Sexual Activity  . Alcohol use: No  . Drug use: No  . Sexual activity: Yes    Birth control/protection: Post-menopausal  Other Topics Concern  . Not on file  Social History Narrative   Lives in [place] with husband.  Does not routinely exercise.   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 Difficulty of Paying Living Expenses: Not on file  Food Insecurity:   . Worried About Programme researcher, broadcasting/film/video in the Last Year: Not on file  . Ran Out of Food in the Last Year: Not on file  Transportation Needs:   . Lack of Transportation (Medical): Not on file  . Lack of Transportation (Non-Medical): Not on file  Physical Activity:   . Days of  Exercise per Week: Not on file  . Minutes of Exercise per Session: Not on file  Stress:   . Feeling of Stress : Not on file  Social Connections:   . Frequency of Communication with Friends and Family: Not on file  . Frequency of Social Gatherings with Friends and Family: Not on file  . Attends Religious Services: Not on file  . Active Member of Clubs or Organizations: Not on file  . Attends Banker Meetings: Not on file  . Marital Status: Not on file  Intimate Partner Violence:   . Fear of Current or Ex-Partner: Not on file  . Emotionally Abused: Not on file  . Physically Abused: Not on file  . Sexually Abused: Not on file    Family History  Problem Relation Age of Onset  . Heart attack Father         multiple MI's  . Healthy Mother   .  Healthy Brother   . Healthy Sister   . Healthy Sister     ROS- All systems are reviewed and negative except as per the HPI above  Physical Exam: Vitals:   11/24/20 1329  BP: 136/84  Pulse: 84  Weight: 120.5 kg  Height: 5' 3.5" (1.613 m)   Wt Readings from Last 3 Encounters:  11/24/20 120.5 kg  11/19/20 109.8 kg  07/17/20 (!) 119.8 kg    Labs: Lab Results  Component Value Date   NA 137 11/19/2020   K 4.8 11/19/2020   CL 105 11/19/2020   CO2 23 07/31/2020   GLUCOSE 152 (H) 11/19/2020   BUN 32 (H) 11/19/2020   CREATININE 1.50 (H) 11/19/2020   CALCIUM 9.3 07/31/2020   MG 1.8 09/26/2013   Lab Results  Component Value Date   INR 1.09 09/23/2013   Lab Results  Component Value Date   CHOL 213 (H) 09/30/2015   HDL 56 09/30/2015   LDLCALC 127 (H) 09/30/2015   TRIG 150 (H) 09/30/2015     GEN- The patient is well appearing, alert and oriented x 3 today.   Head- normocephalic, atraumatic Eyes-  Sclera clear, conjunctiva pink Ears- hearing intact Oropharynx- clear Neck- supple, no JVP Lymph- no cervical lymphadenopathy Lungs- Clear to ausculation bilaterally, normal work of breathing Heart-  Mildly irregular  rate and rhythm, no murmurs, rubs or gallops, PMI not laterally displaced GI- soft, NT, ND, + BS Extremities- no clubbing, cyanosis, or edema MS- no significant deformity or atrophy Skin- no rash or lesion Psych- euthymic mood, full affect Neuro- strength and sensation are intact  EKG-afib at 84 bpm, LAFB, qrs int 122 ms, qtc 451 ms Echo scheduled for 3/3 cancelled and will be rescheduled after cardioversion    Assessment and Plan: 1. Persistent  afib/flutter  Successful cardioversion but with ERAF She is not ready for repeat ablation  Will  reload on amio 200 mg bid for 2 weeks and I will see back and schedule for repeat cardioversion, she would prefer to be after Christmas States no missed doses of eliquis for the last 3 weeks   2. HF Weight stable Avoid salt   F/u here in 2 weeks   Lupita Leash C. [facility] [address] [place], [place] [address] [phone]

## 2020-11-24 NOTE — Patient Instructions (Signed)
Amiodarone 200mg  twice a day until we see you for follow up.

## 2020-12-02 ENCOUNTER — Other Ambulatory Visit (HOSPITAL_COMMUNITY): Payer: Self-pay | Admitting: Cardiology

## 2020-12-08 ENCOUNTER — Ambulatory Visit (HOSPITAL_COMMUNITY)
Admission: RE | Admit: 2020-12-08 | Discharge: 2020-12-08 | Disposition: A | Discharge status: Home / Self Care | Payer: Medicare Other | Source: Ambulatory Visit | Attending: Nurse Practitioner | Admitting: Nurse Practitioner

## 2020-12-08 ENCOUNTER — Encounter (HOSPITAL_COMMUNITY): Payer: Self-pay | Admitting: Nurse Practitioner

## 2020-12-08 ENCOUNTER — Other Ambulatory Visit: Payer: Self-pay

## 2020-12-08 VITALS — BP 142/80 | HR 78 | Ht 63.5 in | Wt 263.4 lb

## 2020-12-08 DIAGNOSIS — D6869 Other thrombophilia: Secondary | ICD-10-CM | POA: Diagnosis not present

## 2020-12-08 DIAGNOSIS — I4892 Unspecified atrial flutter: Secondary | ICD-10-CM | POA: Diagnosis not present

## 2020-12-08 DIAGNOSIS — Z7901 Long term (current) use of anticoagulants: Secondary | ICD-10-CM | POA: Insufficient documentation

## 2020-12-08 DIAGNOSIS — I4819 Other persistent atrial fibrillation: Secondary | ICD-10-CM | POA: Diagnosis present

## 2020-12-08 DIAGNOSIS — Z79899 Other long term (current) drug therapy: Secondary | ICD-10-CM | POA: Insufficient documentation

## 2020-12-08 MED ORDER — AMIODARONE HCL 200 MG PO TABS: 200.0000 mg | ORAL_TABLET | Freq: Two times a day (BID) | ORAL | 3 refills | Status: DC

## 2020-12-08 NOTE — H&P (View-Only) (Signed)
Primary Care Physician: Barbie Banner, MD Referring Physician: Dr. Johney Frame Cardiologist: Dr. Ramonita Lab Shimkus is a 74 y.o. female with a h/o, HF,  Afib, s/p ablation 2014,on short term amiodarone at that time, that was seen by Dr. Johney Frame 01/21/19 and found to be in afib. She reported that she was under a lot of stress and noted an increase in heart rate then from the 60's to the 80's which has been consistent since that time. Dr. Johney Frame wanted her to have a cardioversion at his visit but she pushed back stating that they had not been successful in the past. Dr. Johney Frame also mentioned that admission for tikosyn was also an option. After much discussion, pt would like to proceed with cardioversion, not ready for a hospitlization for repeat abaltion, understanding that it might not be successful long term.   F/u in afib clinic, 11/24/20. Pt noted return to afib and contacted Dr. Shirlee Latch and was given a short load of amiodarone and set up for cardioversion. It was successful but she noted return to afib this am.  She is rate controlled. She  is not interested in another ablation at at his time. She  would like to load longer on amiodarone and try another cardioversion in several weeks.   F/u in afib clinic, 12/08/20, she has been loading on amiodarone longer at 200 mg bid for failing  previous cardioversion with a short amio load. SHe wanted one more chance at cardioversion. She is tolerating well. She is rate controlled. She  would prefer for this to be scheduled after the first of the year with Dr. Shirlee Latch.    Today, she denies symptoms of palpitations, chest pain, shortness of breath, orthopnea, PND, lower extremity edema, dizziness, presyncope, syncope, or neurologic sequela. The patient is tolerating medications without difficulties and is otherwise without complaint today.   Past Medical History:  Diagnosis Date  . Chronic systolic heart failure (HCC)    a. ECHO (09/2013) EF 20-25% diff HK,  mild/mod MR, LA mod dil, RV mildly dil, RA mildly dil b. R/LHC (09/26/13) AO 133/88, LV 137/17, Lmain no dz, LAD 30% prox, LCX mild luminal irreg; RCA mild luminal irreg  . GERD (gastroesophageal reflux disease)    a. 09/2013 endoscopy nl  . Morbid obesity (HCC)   . Osteoarthritis   . Persistent atrial fibrillation (HCC) Chads2vascscore of at least 4(02/12/15)   a. Dx 09/2013 b. s/p PVI by Dr Johney Frame 11-26-2013   Past Surgical History:  Procedure Laterality Date  . ABLATION  11-26-2013   PVI by Dr Johney Frame  . arthroscopy       left knee    . ATRIAL FIBRILLATION ABLATION N/A 11/26/2013   Procedure: ATRIAL FIBRILLATION ABLATION;  Surgeon: Gardiner Rhyme, MD;  Location: [facility] CATH LAB;  Service: Cardiovascular;  Laterality: N/A;  . CARDIOVERSION N/A 10/01/2013   Procedure: CARDIOVERSION;  Surgeon: Laurey Morale, MD;  Location: [facility] ENDOSCOPY;  Service: Cardiovascular;  Laterality: N/A;  . CARDIOVERSION N/A 10/17/2013   Procedure: CARDIOVERSION;  Surgeon: Laurey Morale, MD;  Location: [facility] ENDOSCOPY;  Service: Cardiovascular;  Laterality: N/A;  . CARDIOVERSION N/A 02/20/2019   Procedure: CARDIOVERSION;  Surgeon: Laurey Morale, MD;  Location: [facility] ENDOSCOPY;  Service: Cardiovascular;  Laterality: N/A;  . CARDIOVERSION N/A 11/19/2020   Procedure: CARDIOVERSION;  Surgeon: Laurey Morale, MD;  Location: [facility] ENDOSCOPY;  Service: Cardiovascular;  Laterality: N/A;  . ESOPHAGOGASTRODUODENOSCOPY N/A 09/25/2013   Procedure: ESOPHAGOGASTRODUODENOSCOPY (EGD);  Surgeon: Gordan Payment  Evette Cristal, MD;  Location: [facility] ENDOSCOPY;  Service: Endoscopy;  Laterality: N/A;  . fx left wrist     open reduction & internal fixaction  . LEFT HEART CATHETERIZATION WITH CORONARY ANGIOGRAM  09/26/2013   Procedure: LEFT HEART CATHETERIZATION WITH CORONARY ANGIOGRAM;  Surgeon: Laurey Morale, MD;  Location: [facility] CATH LAB;  Service: Cardiovascular;;  . TEE WITHOUT CARDIOVERSION N/A 10/01/2013   Procedure: TRANSESOPHAGEAL ECHOCARDIOGRAM (TEE);   Surgeon: Laurey Morale, MD;  Location: [facility] ENDOSCOPY;  Service: Cardiovascular;  Laterality: N/A;  . TEE WITHOUT CARDIOVERSION N/A 11/26/2013   Procedure: TRANSESOPHAGEAL ECHOCARDIOGRAM (TEE);  Surgeon: Laurey Morale, MD;  Location: [facility] ENDOSCOPY;  Service: Cardiovascular;  Laterality: N/A;    Current Outpatient Medications  Medication Sig Dispense Refill  . carvedilol (COREG) 12.5 MG tablet Take 1 tablet (12.5 mg total) by mouth 2 (two) times daily with a meal. 180 tablet 3  . ELIQUIS 5 MG TABS tablet TAKE 1 TABLET BY MOUTH  TWICE DAILY 180 tablet 3  . famotidine (PEPCID) 20 MG tablet TAKE 1 TABLET 2 TIMES DAILY. MUST KEEP PENDING APPOINTMENT 180 tablet 0  . losartan (COZAAR) 25 MG tablet TAKE 1 TABLET BY MOUTH TWICE A DAY 180 tablet 3  . Multiple Vitamins-Minerals (MULTIVITAMIN WITH MINERALS) tablet Take 1 tablet by mouth daily.    Marland Kitchen spironolactone (ALDACTONE) 25 MG tablet Take 1 tablet (25 mg total) by mouth daily. 90 tablet 3  . amiodarone (PACERONE) 200 MG tablet Take 1 tablet (200 mg total) by mouth 2 (two) times daily. 60 tablet 3   No current facility-administered medications for this encounter.    No Known Allergies  Social History   Socioeconomic History  . Marital status: Married    Spouse name: Not on file  . Number of children: Not on file  . Years of education: Not on file  . Highest education level: Not on file  Occupational History  . Not on file  Tobacco Use  . Smoking status: Never Smoker  . Smokeless tobacco: Never Used  Vaping Use  . Vaping Use: Never used  Substance and Sexual Activity  . Alcohol use: No  . Drug use: No  . Sexual activity: Yes    Birth control/protection: Post-menopausal  Other Topics Concern  . Not on file  Social History Narrative   Lives in [place] with husband.  Does not routinely exercise.   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 Heart attack Father         multiple MI's  . Healthy Mother   . Healthy Brother   . Healthy Sister   . Healthy Sister     ROS- All systems are reviewed and negative except as per the HPI above  Physical Exam: Vitals:   12/08/20 1348  BP: (!) 142/80  Pulse: 78  Weight: 119.5 kg  Height: 5' 3.5" (1.613 m)   Wt Readings from Last 3 Encounters:  12/08/20 119.5 kg  11/24/20 120.5 kg  11/19/20 109.8 kg    Labs: Lab Results  Component Value Date   NA 137 11/19/2020   K 4.8 11/19/2020   CL 105 11/19/2020   CO2  23 07/31/2020   GLUCOSE 152 (H) 11/19/2020   BUN 32 (H) 11/19/2020   CREATININE 1.50 (H) 11/19/2020   CALCIUM 9.3 07/31/2020   MG 1.8 09/26/2013   Lab Results  Component Value Date   INR 1.09 09/23/2013   Lab Results  Component Value Date   CHOL 213 (H) 09/30/2015   HDL 56 09/30/2015   LDLCALC 127 (H) 09/30/2015   TRIG 150 (H) 09/30/2015     GEN- The patient is well appearing, alert and oriented x 3 today.   Head- normocephalic, atraumatic Eyes-  Sclera clear, conjunctiva pink Ears- hearing intact Oropharynx- clear Neck- supple, no JVP Lymph- no cervical lymphadenopathy Lungs- Clear to ausculation bilaterally, normal work of breathing Heart-  Mildly irregular rate and rhythm, no murmurs, rubs or gallops, PMI not laterally displaced GI- soft, NT, ND, + BS Extremities- no clubbing, cyanosis, or edema MS- no significant deformity or atrophy Skin- no rash or lesion Psych- euthymic mood, full affect Neuro- strength and sensation are intact  EKG-afib at 78 bpm, left axis  deviation,  qrs int 122 ms, qtc 458 ms    Assessment and Plan: 1. Persistent  afib/flutter  Successful cardioversion but with ERAF She is not ready for repeat  ablation  Reloading   on amio 200 mg bid for 2-3 more  weeks and I will she would like to hav CV after the first of the year with Dr. Shirlee Latch We will schedule closer to the procedure  with labs and covid testing States no missed doses of eliquis for the last 3 weeks.  Continue   eliquis 5 mg qd for a CHA2DS2VASc of at least 3. States no missed  doses for at least 3 weeks   2. HF Weight stable Avoid salt    pt will be notified of plans for  CV when scheduled   Lupita Leash C. [facility] [address] [place], [place] [address] [phone]

## 2020-12-08 NOTE — Progress Notes (Signed)
Primary Care Physician: Barbie Banner, MD Referring Physician: Dr. Johney Frame Cardiologist: Dr. Ramonita Lab Shimkus is a 74 y.o. female with a h/o, HF,  Afib, s/p ablation 2014,on short term amiodarone at that time, that was seen by Dr. Johney Frame 01/21/19 and found to be in afib. She reported that she was under a lot of stress and noted an increase in heart rate then from the 60's to the 80's which has been consistent since that time. Dr. Johney Frame wanted her to have a cardioversion at his visit but she pushed back stating that they had not been successful in the past. Dr. Johney Frame also mentioned that admission for tikosyn was also an option. After much discussion, pt would like to proceed with cardioversion, not ready for a hospitlization for repeat abaltion, understanding that it might not be successful long term.   F/u in afib clinic, 11/24/20. Pt noted return to afib and contacted Dr. Shirlee Latch and was given a short load of amiodarone and set up for cardioversion. It was successful but she noted return to afib this am.  She is rate controlled. She  is not interested in another ablation at at his time. She  would like to load longer on amiodarone and try another cardioversion in several weeks.   F/u in afib clinic, 12/08/20, she has been loading on amiodarone longer at 200 mg bid for failing  previous cardioversion with a short amio load. SHe wanted one more chance at cardioversion. She is tolerating well. She is rate controlled. She  would prefer for this to be scheduled after the first of the year with Dr. Shirlee Latch.    Today, she denies symptoms of palpitations, chest pain, shortness of breath, orthopnea, PND, lower extremity edema, dizziness, presyncope, syncope, or neurologic sequela. The patient is tolerating medications without difficulties and is otherwise without complaint today.   Past Medical History:  Diagnosis Date  . Chronic systolic heart failure (HCC)    a. ECHO (09/2013) EF 20-25% diff HK,  mild/mod MR, LA mod dil, RV mildly dil, RA mildly dil b. R/LHC (09/26/13) AO 133/88, LV 137/17, Lmain no dz, LAD 30% prox, LCX mild luminal irreg; RCA mild luminal irreg  . GERD (gastroesophageal reflux disease)    a. 09/2013 endoscopy nl  . Morbid obesity (HCC)   . Osteoarthritis   . Persistent atrial fibrillation (HCC) Chads2vascscore of at least 4(02/12/15)   a. Dx 09/2013 b. s/p PVI by Dr Johney Frame 11-26-2013   Past Surgical History:  Procedure Laterality Date  . ABLATION  11-26-2013   PVI by Dr Johney Frame  . arthroscopy       left knee    . ATRIAL FIBRILLATION ABLATION N/A 11/26/2013   Procedure: ATRIAL FIBRILLATION ABLATION;  Surgeon: Gardiner Rhyme, MD;  Location: [facility] CATH LAB;  Service: Cardiovascular;  Laterality: N/A;  . CARDIOVERSION N/A 10/01/2013   Procedure: CARDIOVERSION;  Surgeon: Laurey Morale, MD;  Location: [facility] ENDOSCOPY;  Service: Cardiovascular;  Laterality: N/A;  . CARDIOVERSION N/A 10/17/2013   Procedure: CARDIOVERSION;  Surgeon: Laurey Morale, MD;  Location: [facility] ENDOSCOPY;  Service: Cardiovascular;  Laterality: N/A;  . CARDIOVERSION N/A 02/20/2019   Procedure: CARDIOVERSION;  Surgeon: Laurey Morale, MD;  Location: [facility] ENDOSCOPY;  Service: Cardiovascular;  Laterality: N/A;  . CARDIOVERSION N/A 11/19/2020   Procedure: CARDIOVERSION;  Surgeon: Laurey Morale, MD;  Location: [facility] ENDOSCOPY;  Service: Cardiovascular;  Laterality: N/A;  . ESOPHAGOGASTRODUODENOSCOPY N/A 09/25/2013   Procedure: ESOPHAGOGASTRODUODENOSCOPY (EGD);  Surgeon: Gordan Payment  Evette Cristal, MD;  Location: [facility] ENDOSCOPY;  Service: Endoscopy;  Laterality: N/A;  . fx left wrist     open reduction & internal fixaction  . LEFT HEART CATHETERIZATION WITH CORONARY ANGIOGRAM  09/26/2013   Procedure: LEFT HEART CATHETERIZATION WITH CORONARY ANGIOGRAM;  Surgeon: Laurey Morale, MD;  Location: [facility] CATH LAB;  Service: Cardiovascular;;  . TEE WITHOUT CARDIOVERSION N/A 10/01/2013   Procedure: TRANSESOPHAGEAL ECHOCARDIOGRAM (TEE);   Surgeon: Laurey Morale, MD;  Location: [facility] ENDOSCOPY;  Service: Cardiovascular;  Laterality: N/A;  . TEE WITHOUT CARDIOVERSION N/A 11/26/2013   Procedure: TRANSESOPHAGEAL ECHOCARDIOGRAM (TEE);  Surgeon: Laurey Morale, MD;  Location: [facility] ENDOSCOPY;  Service: Cardiovascular;  Laterality: N/A;    Current Outpatient Medications  Medication Sig Dispense Refill  . carvedilol (COREG) 12.5 MG tablet Take 1 tablet (12.5 mg total) by mouth 2 (two) times daily with a meal. 180 tablet 3  . ELIQUIS 5 MG TABS tablet TAKE 1 TABLET BY MOUTH  TWICE DAILY 180 tablet 3  . famotidine (PEPCID) 20 MG tablet TAKE 1 TABLET 2 TIMES DAILY. MUST KEEP PENDING APPOINTMENT 180 tablet 0  . losartan (COZAAR) 25 MG tablet TAKE 1 TABLET BY MOUTH TWICE A DAY 180 tablet 3  . Multiple Vitamins-Minerals (MULTIVITAMIN WITH MINERALS) tablet Take 1 tablet by mouth daily.    Marland Kitchen spironolactone (ALDACTONE) 25 MG tablet Take 1 tablet (25 mg total) by mouth daily. 90 tablet 3  . amiodarone (PACERONE) 200 MG tablet Take 1 tablet (200 mg total) by mouth 2 (two) times daily. 60 tablet 3   No current facility-administered medications for this encounter.    No Known Allergies  Social History   Socioeconomic History  . Marital status: Married    Spouse name: Not on file  . Number of children: Not on file  . Years of education: Not on file  . Highest education level: Not on file  Occupational History  . Not on file  Tobacco Use  . Smoking status: Never Smoker  . Smokeless tobacco: Never Used  Vaping Use  . Vaping Use: Never used  Substance and Sexual Activity  . Alcohol use: No  . Drug use: No  . Sexual activity: Yes    Birth control/protection: Post-menopausal  Other Topics Concern  . Not on file  Social History Narrative   Lives in [place] with husband.  Does not routinely exercise.   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 Heart attack Father         multiple MI's  . Healthy Mother   . Healthy Brother   . Healthy Sister   . Healthy Sister     ROS- All systems are reviewed and negative except as per the HPI above  Physical Exam: Vitals:   12/08/20 1348  BP: (!) 142/80  Pulse: 78  Weight: 119.5 kg  Height: 5' 3.5" (1.613 m)   Wt Readings from Last 3 Encounters:  12/08/20 119.5 kg  11/24/20 120.5 kg  11/19/20 109.8 kg    Labs: Lab Results  Component Value Date   NA 137 11/19/2020   K 4.8 11/19/2020   CL 105 11/19/2020   CO2  23 07/31/2020   GLUCOSE 152 (H) 11/19/2020   BUN 32 (H) 11/19/2020   CREATININE 1.50 (H) 11/19/2020   CALCIUM 9.3 07/31/2020   MG 1.8 09/26/2013   Lab Results  Component Value Date   INR 1.09 09/23/2013   Lab Results  Component Value Date   CHOL 213 (H) 09/30/2015   HDL 56 09/30/2015   LDLCALC 127 (H) 09/30/2015   TRIG 150 (H) 09/30/2015     GEN- The patient is well appearing, alert and oriented x 3 today.   Head- normocephalic, atraumatic Eyes-  Sclera clear, conjunctiva pink Ears- hearing intact Oropharynx- clear Neck- supple, no JVP Lymph- no cervical lymphadenopathy Lungs- Clear to ausculation bilaterally, normal work of breathing Heart-  Mildly irregular rate and rhythm, no murmurs, rubs or gallops, PMI not laterally displaced GI- soft, NT, ND, + BS Extremities- no clubbing, cyanosis, or edema MS- no significant deformity or atrophy Skin- no rash or lesion Psych- euthymic mood, full affect Neuro- strength and sensation are intact  EKG-afib at 78 bpm, left axis  deviation,  qrs int 122 ms, qtc 458 ms    Assessment and Plan: 1. Persistent  afib/flutter  Successful cardioversion but with ERAF She is not ready for repeat  ablation  Reloading   on amio 200 mg bid for 2-3 more  weeks and I will she would like to hav CV after the first of the year with Dr. Shirlee Latch We will schedule closer to the procedure  with labs and covid testing States no missed doses of eliquis for the last 3 weeks.  Continue   eliquis 5 mg qd for a CHA2DS2VASc of at least 3. States no missed  doses for at least 3 weeks   2. HF Weight stable Avoid salt    pt will be notified of plans for  CV when scheduled   Lupita Leash C. [facility] [address] [place], [place] [address] [phone]

## 2020-12-14 ENCOUNTER — Other Ambulatory Visit (HOSPITAL_COMMUNITY): Payer: Self-pay | Admitting: *Deleted

## 2020-12-23 ENCOUNTER — Other Ambulatory Visit (HOSPITAL_COMMUNITY)
Admission: RE | Admit: 2020-12-23 | Discharge: 2020-12-23 | Disposition: A | Discharge status: Home / Self Care | Payer: Medicare Other | Source: Ambulatory Visit | Attending: Cardiology | Admitting: Cardiology

## 2020-12-23 DIAGNOSIS — Z01812 Encounter for preprocedural laboratory examination: Secondary | ICD-10-CM | POA: Insufficient documentation

## 2020-12-23 DIAGNOSIS — Z20822 Contact with and (suspected) exposure to covid-19: Secondary | ICD-10-CM | POA: Insufficient documentation

## 2020-12-24 LAB — SARS CORONAVIRUS 2 (TAT 6-24 HRS): SARS Coronavirus 2: NEGATIVE

## 2020-12-25 ENCOUNTER — Ambulatory Visit (HOSPITAL_COMMUNITY)
Admission: RE | Admit: 2020-12-25 | Discharge: 2020-12-25 | Disposition: A | Discharge status: Home / Self Care | Payer: Medicare Other | Source: Ambulatory Visit | Attending: Physician Assistant | Admitting: Physician Assistant

## 2020-12-25 ENCOUNTER — Ambulatory Visit (HOSPITAL_COMMUNITY): Payer: Medicare Other | Admitting: Anesthesiology

## 2020-12-25 ENCOUNTER — Ambulatory Visit (HOSPITAL_COMMUNITY)
Admission: RE | Admit: 2020-12-25 | Discharge: 2020-12-25 | Disposition: A | Discharge status: Home / Self Care | Payer: Medicare Other | Attending: Cardiology | Admitting: Cardiology

## 2020-12-25 ENCOUNTER — Other Ambulatory Visit: Payer: Self-pay

## 2020-12-25 ENCOUNTER — Encounter (HOSPITAL_COMMUNITY)
Admission: RE | Disposition: A | Discharge status: Home / Self Care | Payer: Self-pay | Source: Home / Self Care | Attending: Cardiology

## 2020-12-25 DIAGNOSIS — I5022 Chronic systolic (congestive) heart failure: Secondary | ICD-10-CM | POA: Diagnosis not present

## 2020-12-25 DIAGNOSIS — I4819 Other persistent atrial fibrillation: Secondary | ICD-10-CM | POA: Diagnosis not present

## 2020-12-25 DIAGNOSIS — Z79899 Other long term (current) drug therapy: Secondary | ICD-10-CM | POA: Insufficient documentation

## 2020-12-25 DIAGNOSIS — Z7901 Long term (current) use of anticoagulants: Secondary | ICD-10-CM | POA: Insufficient documentation

## 2020-12-25 DIAGNOSIS — I4892 Unspecified atrial flutter: Secondary | ICD-10-CM | POA: Insufficient documentation

## 2020-12-25 HISTORY — PX: CARDIOVERSION: SHX1299

## 2020-12-25 LAB — POCT I-STAT, CHEM 8
BUN: 30 mg/dL — ABNORMAL HIGH (ref 8–23)
Calcium, Ion: 1.17 mmol/L (ref 1.15–1.40)
Chloride: 106 mmol/L (ref 98–111)
Creatinine, Ser: 1.7 mg/dL — ABNORMAL HIGH (ref 0.44–1.00)
Glucose, Bld: 150 mg/dL — ABNORMAL HIGH (ref 70–99)
HCT: 35 % — ABNORMAL LOW (ref 36.0–46.0)
Hemoglobin: 11.9 g/dL — ABNORMAL LOW (ref 12.0–15.0)
Potassium: 5.1 mmol/L (ref 3.5–5.1)
Sodium: 137 mmol/L (ref 135–145)
TCO2: 23 mmol/L (ref 22–32)

## 2020-12-25 LAB — BASIC METABOLIC PANEL
Anion gap: 11 (ref 5–15)
BUN: 27 mg/dL — ABNORMAL HIGH (ref 8–23)
CO2: 22 mmol/L (ref 22–32)
Calcium: 9.3 mg/dL (ref 8.9–10.3)
Chloride: 105 mmol/L (ref 98–111)
Creatinine, Ser: 1.67 mg/dL — ABNORMAL HIGH (ref 0.44–1.00)
GFR, Estimated: 32 mL/min — ABNORMAL LOW (ref >60)
Glucose, Bld: 158 mg/dL — ABNORMAL HIGH (ref 70–99)
Potassium: 5.6 mmol/L — ABNORMAL HIGH (ref 3.5–5.1)
Sodium: 138 mmol/L (ref 135–145)

## 2020-12-25 LAB — CBC
HCT: 38.9 % (ref 36.0–46.0)
Hemoglobin: 12.1 g/dL (ref 12.0–15.0)
MCH: 27.4 pg (ref 26.0–34.0)
MCHC: 31.1 g/dL (ref 30.0–36.0)
MCV: 88.2 fL (ref 80.0–100.0)
Platelets: 446 10*3/uL — ABNORMAL HIGH (ref 150–400)
RBC: 4.41 MIL/uL (ref 3.87–5.11)
RDW: 14.1 % (ref 11.5–15.5)
WBC: 7.5 10*3/uL (ref 4.0–10.5)
nRBC: 0 % (ref 0.0–0.2)

## 2020-12-25 SURGERY — CARDIOVERSION
Anesthesia: General

## 2020-12-25 MED ORDER — EPHEDRINE SULFATE 50 MG/ML IJ SOLN
INTRAMUSCULAR | Status: DC | PRN
  Administered 2020-12-25 (×2): 5 mg via INTRAVENOUS

## 2020-12-25 MED ORDER — PROPOFOL 10 MG/ML IV BOLUS
INTRAVENOUS | Status: DC | PRN
  Administered 2020-12-25: 50 mg via INTRAVENOUS

## 2020-12-25 MED ORDER — SODIUM CHLORIDE 0.9 % IV SOLN
INTRAVENOUS | Status: AC | PRN
  Administered 2020-12-25: 500 mL via INTRAMUSCULAR

## 2020-12-25 MED ORDER — LIDOCAINE HCL (CARDIAC) PF 100 MG/5ML IV SOSY
PREFILLED_SYRINGE | INTRAVENOUS | Status: DC | PRN
  Administered 2020-12-25: 60 mg via INTRATRACHEAL

## 2020-12-25 NOTE — Procedures (Signed)
Electrical Cardioversion Procedure Note Jeani Fassnacht [id number] 01/25/1946  Procedure: Electrical Cardioversion Indications:  Atrial Fibrillation  Procedure Details Consent: Risks of procedure as well as the alternatives and risks of each were explained to the (patient/caregiver).  Consent for procedure obtained. Time Out: Verified patient identification, verified procedure, site/side was marked, verified correct patient position, special equipment/implants available, medications/allergies/relevent history reviewed, required imaging and test results available.  Performed  Patient placed on cardiac monitor, pulse oximetry, supplemental oxygen as necessary.  Sedation given: Propofol per anesthesiology Pacer pads placed anterior and posterior chest.  Cardioverted 1 time(s).  Cardioverted at 200J.  Evaluation Findings: Post procedure EKG shows: NSR Complications: None Patient did tolerate procedure well.   [name] 12/25/2020, 12:37 PM

## 2020-12-25 NOTE — Discharge Instructions (Signed)
Electrical Cardioversion Electrical cardioversion is the delivery of a jolt of electricity to restore a normal rhythm to the heart. A rhythm that is too fast or is not regular keeps the heart from pumping well. In this procedure, sticky patches or metal paddles are placed on the chest to deliver electricity to the heart from a device. This procedure may be done in an emergency if:  There is low or no blood pressure as a result of the heart rhythm.  Normal rhythm must be restored as fast as possible to protect the brain and heart from further damage.  It may save a life. This may also be a scheduled procedure for irregular or fast heart rhythms that are not immediately life-threatening. Tell a health care provider about:  Any allergies you have.  All medicines you are taking, including vitamins, herbs, eye drops, creams, and over-the-counter medicines.  Any problems you or family members have had with anesthetic medicines.  Any blood disorders you have.  Any surgeries you have had.  Any medical conditions you have.  Whether you are pregnant or may be pregnant. What are the risks? Generally, this is a safe procedure. However, problems may occur, including:  Allergic reactions to medicines.  A blood clot that breaks free and travels to other parts of your body.  The possible return of an abnormal heart rhythm within hours or days after the procedure.  Your heart stopping (cardiac arrest). This is rare. What happens before the procedure? Medicines  Your health care provider may have you start taking: ? Blood-thinning medicines (anticoagulants) so your blood does not clot as easily. ? Medicines to help stabilize your heart rate and rhythm.  Ask your health care provider about: ? Changing or stopping your regular medicines. This is especially important if you are taking diabetes medicines or blood thinners. ? Taking medicines such as aspirin and ibuprofen. These medicines can  thin your blood. Do not take these medicines unless your health care provider tells you to take them. ? Taking over-the-counter medicines, vitamins, herbs, and supplements. General instructions  Follow instructions from your health care provider about eating or drinking restrictions.  Plan to have someone take you home from the hospital or clinic.  If you will be going home right after the procedure, plan to have someone with you for 24 hours.  Ask your health care provider what steps will be taken to help prevent infection. These may include washing your skin with a germ-killing soap. What happens during the procedure?   An IV will be inserted into one of your veins.  Sticky patches (electrodes) or metal paddles may be placed on your chest.  You will be given a medicine to help you relax (sedative).  An electrical shock will be delivered. The procedure may vary among health care providers and hospitals. What can I expect after the procedure?  Your blood pressure, heart rate, breathing rate, and blood oxygen level will be monitored until you leave the hospital or clinic.  Your heart rhythm will be watched to make sure it does not change.  You may have some redness on the skin where the shocks were given. Follow these instructions at home:  Do not drive for 24 hours if you were given a sedative during your procedure.  Take over-the-counter and prescription medicines only as told by your health care provider.  Ask your health care provider how to check your pulse. Check it often.  Rest for 48 hours after the procedure or   as told by your health care provider.  Avoid or limit your caffeine use as told by your health care provider.  Keep all follow-up visits as told by your health care provider. This is important. Contact a health care provider if:  You feel like your heart is beating too quickly or your pulse is not regular.  You have a serious muscle cramp that does not go  away. Get help right away if:  You have discomfort in your chest.  You are dizzy or you feel faint.  You have trouble breathing or you are short of breath.  Your speech is slurred.  You have trouble moving an arm or leg on one side of your body.  Your fingers or toes turn cold or blue. Summary  Electrical cardioversion is the delivery of a jolt of electricity to restore a normal rhythm to the heart.  This procedure may be done right away in an emergency or may be a scheduled procedure if the condition is not an emergency.  Generally, this is a safe procedure.  After the procedure, check your pulse often as told by your health care provider. This information is not intended to replace advice given to you by your health care provider. Make sure you discuss any questions you have with your health care provider. Document Revised: 07/08/2019 Document Reviewed: 07/08/2019 Elsevier Patient Education  2020 [employer]  

## 2020-12-25 NOTE — Interval H&P Note (Signed)
History and Physical Interval Note:  12/25/2020 12:22 PM  Kelli Webster  has presented today for surgery, with the diagnosis of AFIB.  The various methods of treatment have been discussed with the patient and family. After consideration of risks, benefits and other options for treatment, the patient has consented to  Procedure(s): CARDIOVERSION (N/A) as a surgical intervention.  The patient's history has been reviewed, patient examined, no change in status, stable for surgery.  I have reviewed the patient's chart and labs.  Questions were answered to the patient's satisfaction.     Yao Hyppolite [employer]

## 2020-12-25 NOTE — Anesthesia Postprocedure Evaluation (Signed)
Anesthesia Post Note  Patient: Kelli Webster  Procedure(s) Performed: CARDIOVERSION (N/A )     Patient location during evaluation: PACU Anesthesia Type: General Level of consciousness: awake and alert, oriented and patient cooperative Pain management: pain level controlled Vital Signs Assessment: post-procedure vital signs reviewed and stable Respiratory status: spontaneous breathing, nonlabored ventilation and respiratory function stable Cardiovascular status: blood pressure returned to baseline and stable Postop Assessment: no apparent nausea or vomiting Anesthetic complications: no   No complications documented.  Last Vitals:  Vitals:   12/25/20 1110 12/25/20 1240  BP: (!) 143/53 (!) 124/49  Pulse: 66 (!) 48  Resp: 12 17  Temp:  36.4 C  SpO2: 97% 97%    Last Pain:  Vitals:   12/25/20 1240  TempSrc: Oral  PainSc: 0-No pain                 Lannie Fields

## 2020-12-25 NOTE — Anesthesia Preprocedure Evaluation (Addendum)
Anesthesia Evaluation  Patient identified by MRN, date of birth, ID band Patient awake    Reviewed: Allergy & Precautions, NPO status , Patient's Chart, lab work & pertinent test results, reviewed documented beta blocker date and time   Airway Mallampati: II  TM Distance: >3 FB Neck ROM: Full    Dental no notable dental hx. (+) Teeth Intact, Dental Advisory Given   Pulmonary neg pulmonary ROS,    Pulmonary exam normal breath sounds clear to auscultation       Cardiovascular hypertension, Pt. on medications and Pt. on home beta blockers +CHF (LVEF 40-45%)  Normal cardiovascular exam+ dysrhythmias (eliquis, s/p multiple cardioversions in past) Atrial Fibrillation + Valvular Problems/Murmurs (mild MR, mild AI) MR and AI  Rhythm:Regular Rate:Normal  Echo 10/2019: 1. Left ventricular ejection fraction, by visual estimation, is 40 to 45%. The left ventricle has mild to moderately decreased function. There is no left ventricular hypertrophy. Diffuse hypokinesis.  2. Left ventricular diastolic parameters are consistent with Grade I  diastolic dysfunction (impaired relaxation).  3. Global right ventricle has normal systolic function.The right ventricular size is normal. No increase in right ventricular wall thickness.  4. Left atrial size was normal.  5. Right atrial size was normal.  6. The mitral valve is normal in structure. Mild mitral valve  regurgitation.  7. The aortic valve is tricuspid. Aortic valve regurgitation is mild.  8. The tricuspid valve is normal in structure. Tricuspid valve regurgitation is trivial.  9. The pulmonic valve was not well visualized. Pulmonic valve regurgitation is trivial.  10. TR signal is inadequate for assessing pulmonary artery systolic pressure.    Neuro/Psych negative neurological ROS  negative psych ROS   GI/Hepatic Neg liver ROS, GERD  Controlled,  Endo/Other  Morbid obesityBMI 46   Renal/GU negative Renal ROS  negative genitourinary   Musculoskeletal  (+) Arthritis , Osteoarthritis,    Abdominal (+) + obese,   Peds  Hematology negative hematology ROS (+)   Anesthesia Other Findings   Reproductive/Obstetrics negative OB ROS                            Anesthesia Physical Anesthesia Plan  ASA: III  Anesthesia Plan: General   Post-op Pain Management:    Induction: Intravenous  PONV Risk Score and Plan: TIVA and Treatment may vary due to age or medical condition  Airway Management Planned: Natural Airway and Mask  Additional Equipment: None  Intra-op Plan:   Post-operative Plan:   Informed Consent: I have reviewed the patients History and Physical, chart, labs and discussed the procedure including the risks, benefits and alternatives for the proposed anesthesia with the patient or authorized representative who has indicated his/her understanding and acceptance.       Plan Discussed with: CRNA  Anesthesia Plan Comments:        Anesthesia Quick Evaluation

## 2020-12-25 NOTE — Transfer of Care (Signed)
Immediate Anesthesia Transfer of Care Note  Patient: Jacqulyn Barresi  Procedure(s) Performed: CARDIOVERSION (N/A )  Patient Location: Endoscopy Unit  Anesthesia Type:General  Level of Consciousness: drowsy and patient cooperative  Airway & Oxygen Therapy: Patient Spontanous Breathing and Patient connected to face mask oxygen  Post-op Assessment: Report given to RN and Post -op Vital signs reviewed and stable  Post vital signs: Reviewed and stable  Last Vitals:  Vitals Value Taken Time  BP 126/48 12/25/20 1304  Temp 36.4 C 12/25/20 1240  Pulse 47 12/25/20 1304  Resp 16 12/25/20 1304  SpO2 98 % 12/25/20 1304    Last Pain:  Vitals:   12/25/20 1304  TempSrc:   PainSc: 0-No pain         Complications: No complications documented.

## 2020-12-26 ENCOUNTER — Encounter (HOSPITAL_COMMUNITY): Payer: Self-pay | Admitting: Cardiology

## 2021-01-04 ENCOUNTER — Ambulatory Visit (HOSPITAL_COMMUNITY): Payer: Medicare Other | Admitting: Nurse Practitioner

## 2021-01-07 ENCOUNTER — Ambulatory Visit (HOSPITAL_COMMUNITY)
Admission: RE | Admit: 2021-01-07 | Discharge: 2021-01-07 | Disposition: A | Discharge status: Home / Self Care | Payer: Medicare Other | Source: Ambulatory Visit | Attending: Nurse Practitioner | Admitting: Nurse Practitioner

## 2021-01-07 ENCOUNTER — Other Ambulatory Visit: Payer: Self-pay

## 2021-01-07 VITALS — BP 122/81 | Wt 261.6 lb

## 2021-01-07 DIAGNOSIS — I4819 Other persistent atrial fibrillation: Secondary | ICD-10-CM | POA: Diagnosis present

## 2021-01-07 DIAGNOSIS — Z79899 Other long term (current) drug therapy: Secondary | ICD-10-CM | POA: Diagnosis not present

## 2021-01-07 DIAGNOSIS — D6869 Other thrombophilia: Secondary | ICD-10-CM

## 2021-01-07 DIAGNOSIS — Z7901 Long term (current) use of anticoagulants: Secondary | ICD-10-CM | POA: Diagnosis not present

## 2021-01-07 DIAGNOSIS — I509 Heart failure, unspecified: Secondary | ICD-10-CM | POA: Insufficient documentation

## 2021-01-07 MED ORDER — AMIODARONE HCL 200 MG PO TABS: 200.0000 mg | ORAL_TABLET | Freq: Every day | ORAL | 3 refills | Status: DC

## 2021-01-07 NOTE — Patient Instructions (Signed)
Decrease amiodarone to 200mg once a day 

## 2021-01-07 NOTE — Progress Notes (Signed)
Primary Care Physician: Barbie Banner, MD Referring Physician: Dr. Johney Frame Cardiologist: Dr. Ramonita Lab Legore is a 75 y.o. female with a h/o, HF,  Afib, s/p ablation 2014,on short term amiodarone at that time, that was seen by Dr. Johney Frame 01/21/19 and found to be in afib. She reported that she was under a lot of stress and noted an increase in heart rate then from the 60's to the 80's which has been consistent since that time. Dr. Johney Frame wanted her to have a cardioversion at his visit but she pushed back stating that they had not been successful in the past. Dr. Johney Frame also mentioned that admission for tikosyn was also an option. After much discussion, pt would like to proceed with cardioversion, not ready for a hospitlization for repeat abaltion, understanding that it might not be successful long term.   F/u in afib clinic, 11/24/20. Pt noted return to afib and contacted Dr. Shirlee Latch and was given a short load of amiodarone and set up for cardioversion. It was successful but she noted return to afib this am.  She is rate controlled. She  is not interested in another ablation at at his time. She  would like to load longer on amiodarone and try another cardioversion in several weeks.   F/u in afib clinic, 12/08/20, she has been loading on amiodarone longer at 200 mg bid for failing  previous cardioversion with a short amio load. She wanted one more chance at cardioversion. She is tolerating well. She is rate controlled. She  would prefer for this to be scheduled after the first of the year with Dr. Shirlee Latch.    F/u in afib clinic, 01/08/20. She had a successful cardioversion 12/26/19 and unfortunately she is back in afib, rate controlled. Despite that she feels improved. She continues on amiodarone at 200 mg bid, will reduce this back to 200 mg daily. She reports that Dr. Shirlee Latch does not want her to remain in afib 2/2 her heart function. She had an ablation in 2014 and she is wanting to discuss an repeat  ablation with Dr. Johney Frame.    Today, she denies symptoms of palpitations, chest pain, shortness of breath, orthopnea, PND, lower extremity edema, dizziness, presyncope, syncope, or neurologic sequela. The patient is tolerating medications without difficulties and is otherwise without complaint today.   Past Medical History:  Diagnosis Date  . Chronic systolic heart failure (HCC)    a. ECHO (09/2013) EF 20-25% diff HK, mild/mod MR, LA mod dil, RV mildly dil, RA mildly dil b. R/LHC (09/26/13) AO 133/88, LV 137/17, Lmain no dz, LAD 30% prox, LCX mild luminal irreg; RCA mild luminal irreg  . GERD (gastroesophageal reflux disease)    a. 09/2013 endoscopy nl  . Morbid obesity (HCC)   . Osteoarthritis   . Persistent atrial fibrillation (HCC) Chads2vascscore of at least 4(02/12/15)   a. Dx 09/2013 b. s/p PVI by Dr Johney Frame 11-26-2013   Past Surgical History:  Procedure Laterality Date  . ABLATION  11-26-2013   PVI by Dr Johney Frame  . arthroscopy       left knee    . ATRIAL FIBRILLATION ABLATION N/A 11/26/2013   Procedure: ATRIAL FIBRILLATION ABLATION;  Surgeon: Gardiner Rhyme, MD;  Location: [facility] CATH LAB;  Service: Cardiovascular;  Laterality: N/A;  . CARDIOVERSION N/A 10/01/2013   Procedure: CARDIOVERSION;  Surgeon: Laurey Morale, MD;  Location: [facility] ENDOSCOPY;  Service: Cardiovascular;  Laterality: N/A;  . CARDIOVERSION N/A 10/17/2013   Procedure: CARDIOVERSION;  Surgeon: Laurey Morale, MD;  Location: [facility] ENDOSCOPY;  Service: Cardiovascular;  Laterality: N/A;  . CARDIOVERSION N/A 02/20/2019   Procedure: CARDIOVERSION;  Surgeon: Laurey Morale, MD;  Location: [facility] ENDOSCOPY;  Service: Cardiovascular;  Laterality: N/A;  . CARDIOVERSION N/A 11/19/2020   Procedure: CARDIOVERSION;  Surgeon: Laurey Morale, MD;  Location: [facility] ENDOSCOPY;  Service: Cardiovascular;  Laterality: N/A;  . CARDIOVERSION N/A 12/25/2020   Procedure: CARDIOVERSION;  Surgeon: Laurey Morale, MD;  Location: [facility] ENDOSCOPY;  Service:  Cardiovascular;  Laterality: N/A;  . ESOPHAGOGASTRODUODENOSCOPY N/A 09/25/2013   Procedure: ESOPHAGOGASTRODUODENOSCOPY (EGD);  Surgeon: Graylin Shiver, MD;  Location: [facility] ENDOSCOPY;  Service: Endoscopy;  Laterality: N/A;  . fx left wrist     open reduction & internal fixaction  . LEFT HEART CATHETERIZATION WITH CORONARY ANGIOGRAM  09/26/2013   Procedure: LEFT HEART CATHETERIZATION WITH CORONARY ANGIOGRAM;  Surgeon: Laurey Morale, MD;  Location: [facility] CATH LAB;  Service: Cardiovascular;;  . TEE WITHOUT CARDIOVERSION N/A 10/01/2013   Procedure: TRANSESOPHAGEAL ECHOCARDIOGRAM (TEE);  Surgeon: Laurey Morale, MD;  Location: [facility] ENDOSCOPY;  Service: Cardiovascular;  Laterality: N/A;  . TEE WITHOUT CARDIOVERSION N/A 11/26/2013   Procedure: TRANSESOPHAGEAL ECHOCARDIOGRAM (TEE);  Surgeon: Laurey Morale, MD;  Location: [facility] ENDOSCOPY;  Service: Cardiovascular;  Laterality: N/A;    Current Outpatient Medications  Medication Sig Dispense Refill  . acetaminophen (TYLENOL) 325 MG tablet Take 325-650 mg by mouth every 6 (six) hours as needed (pain.).    Marland Kitchen amiodarone (PACERONE) 200 MG tablet Take 1 tablet (200 mg total) by mouth 2 (two) times daily. 60 tablet 3  . carvedilol (COREG) 12.5 MG tablet Take 1 tablet (12.5 mg total) by mouth 2 (two) times daily with a meal. 180 tablet 3  . ELIQUIS 5 MG TABS tablet TAKE 1 TABLET BY MOUTH  TWICE DAILY (Patient taking differently: Take 5 mg by mouth 2 (two) times daily.) 180 tablet 3  . famotidine (PEPCID) 20 MG tablet TAKE 1 TABLET 2 TIMES DAILY. MUST KEEP PENDING APPOINTMENT (Patient taking differently: Take 20 mg by mouth 2 (two) times daily as needed for indigestion or heartburn.) 180 tablet 0  . losartan (COZAAR) 25 MG tablet TAKE 1 TABLET BY MOUTH TWICE A DAY (Patient taking differently: Take 25 mg by mouth in the morning and at bedtime.) 180 tablet 3  . Multiple Vitamins-Minerals (MULTIVITAMIN WITH MINERALS) tablet Take 1 tablet by mouth daily. Centrum Multivitamin     . spironolactone (ALDACTONE) 25 MG tablet Take 1 tablet (25 mg total) by mouth daily. 90 tablet 3   No current facility-administered medications for this encounter.    No Known Allergies  Social History   Socioeconomic History  . Marital status: Married    Spouse name: Not on file  . Number of children: Not on file  . Years of education: Not on file  . Highest education level: Not on file  Occupational History  . Not on file  Tobacco Use  . Smoking status: Never Smoker  . Smokeless tobacco: Never Used  Vaping Use  . Vaping Use: Never used  Substance and Sexual Activity  . Alcohol use: No  . Drug use: No  . Sexual activity: Yes    Birth control/protection: Post-menopausal  Other Topics Concern  . Not on file  Social History Narrative   Lives in [place] with husband.  Does not routinely exercise.   Realtor with [employer]   Social Determinants of Health   Financial Resource Strain: Not on file  Food Insecurity: Not on file  Transportation Needs: Not on file  Physical Activity: Not on file  Stress: Not on file  Social Connections: Not on file  Intimate Partner Violence: Not on file    Family History  Problem Relation Age of Onset  . Heart attack Father         multiple MI's  . Healthy Mother   . Healthy Brother   . Healthy Sister   . Healthy Sister     ROS- All systems are reviewed and negative except as per the HPI above  Physical Exam: Vitals:   01/07/21 1342  BP: 122/81  Weight: 118.7 kg   Wt Readings from Last 3 Encounters:  01/07/21 118.7 kg  12/25/20 113.4 kg  12/08/20 119.5 kg    Labs: Lab Results  Component Value Date   NA 137 12/25/2020   K 5.1 12/25/2020   CL 106 12/25/2020   CO2 22 12/25/2020   GLUCOSE 150 (H) 12/25/2020   BUN 30 (H) 12/25/2020   CREATININE 1.70 (H) 12/25/2020   CALCIUM 9.3 12/25/2020   MG 1.8 09/26/2013   Lab Results  Component Value Date   INR 1.09 09/23/2013   Lab Results  Component Value Date   CHOL 213  (H) 09/30/2015   HDL 56 09/30/2015   LDLCALC 127 (H) 09/30/2015   TRIG 150 (H) 09/30/2015     GEN- The patient is well appearing, alert and oriented x 3 today.   Head- normocephalic, atraumatic Eyes-  Sclera clear, conjunctiva pink Ears- hearing intact Oropharynx- clear Neck- supple, no JVP Lymph- no cervical lymphadenopathy Lungs- Clear to ausculation bilaterally, normal work of breathing Heart - irregular rate and rhythm, no murmurs, rubs or gallops, PMI not laterally displaced GI- soft, NT, ND, + BS Extremities- no clubbing, cyanosis, or edema MS- no significant deformity or atrophy Skin- no rash or lesion Psych- euthymic mood, full affect Neuro- strength and sensation are intact  EKG- afib at 81 bpm, qrs int 130 ms, qtc 453 ms   Echo-1. Left ventricular ejection fraction, by visual estimation, is 40 to 45%. The left ventricle has mild to moderately decreased function. There is no left ventricular hypertrophy. Diffuse hypokinesis. 2. Left ventricular diastolic parameters are consistent with Grade I diastolic dysfunction (impaired relaxation). 3. Global right ventricle has normal systolic function.The right ventricular size is normal. No increase in right ventricular wall thickness. 4. Left atrial size was normal. 5. Right atrial size was normal. 6. The mitral valve is normal in structure. Mild mitral valve regurgitation. 7. The aortic valve is tricuspid. Aortic valve regurgitation is mild. 8. The tricuspid valve is normal in structure. Tricuspid valve regurgitation is trivial. 9. The pulmonic valve was not well visualized. Pulmonic valve regurgitation is trivial. 10. TR signal is inadequate for assessing pulmonary artery systolic pressure.   Assessment and Plan: 1. Persistent  afib/flutter  Successful cardioversion 12/21 but with ERAF Repeat cardioversion was successful 1/7 after a longer amiodarone load but ERAF She now wishes to discuss repeat ablation with Dr.  Johney Frame  Reduce amiodarone to 200 mg daily    2. CHA2DS2VASc of at least 3 Continue  eliquis 5 mg qd   2. HF Weight stable Avoid salt    F/u with Dr. Johney Frame to discuss repeat ablation requested   Kelli Webster. [facility] [address] [place], [place] [address] [phone]

## 2021-01-18 ENCOUNTER — Other Ambulatory Visit: Payer: Self-pay

## 2021-01-18 ENCOUNTER — Encounter: Payer: Self-pay | Admitting: *Deleted

## 2021-01-18 ENCOUNTER — Encounter: Payer: Self-pay | Admitting: Internal Medicine

## 2021-01-18 ENCOUNTER — Ambulatory Visit: Payer: Medicare Other | Admitting: Internal Medicine

## 2021-01-18 VITALS — BP 112/78 | HR 83 | Ht 63.5 in | Wt 263.2 lb

## 2021-01-18 DIAGNOSIS — I4891 Unspecified atrial fibrillation: Secondary | ICD-10-CM

## 2021-01-18 DIAGNOSIS — D6869 Other thrombophilia: Secondary | ICD-10-CM

## 2021-01-18 DIAGNOSIS — Z6841 Body Mass Index (BMI) 40.0 and over, adult: Secondary | ICD-10-CM

## 2021-01-18 DIAGNOSIS — I4819 Other persistent atrial fibrillation: Secondary | ICD-10-CM | POA: Diagnosis not present

## 2021-01-18 NOTE — Progress Notes (Signed)
PCP: Barbie Banner, MD Primary Cardiologist: Dr Shirlee Latch Primary EP: Dr Johney Frame  Kelli Webster is a 75 y.o. female who presents today for routine electrophysiology followup.  Since last being seen in our clinic, the patient reports doing reasonably well.  She continues to have afib.  + fatigue, SOB and decreased exercise tolerance.  She was in sinus in July 2021 (EKG reviewed) but has had otherwise, quite frequent afib.  Today, she denies symptoms of palpitations, chest pain,   lower extremity edema, dizziness, presyncope, or syncope.  The patient is otherwise without complaint today.   Past Medical History:  Diagnosis Date  . Chronic systolic heart failure (HCC)    a. ECHO (09/2013) EF 20-25% diff HK, mild/mod MR, LA mod dil, RV mildly dil, RA mildly dil b. R/LHC (09/26/13) AO 133/88, LV 137/17, Lmain no dz, LAD 30% prox, LCX mild luminal irreg; RCA mild luminal irreg  . GERD (gastroesophageal reflux disease)    a. 09/2013 endoscopy nl  . Morbid obesity (HCC)   . Osteoarthritis   . Persistent atrial fibrillation (HCC) Chads2vascscore of at least 4(02/12/15)   a. Dx 09/2013 b. s/p PVI by Dr Johney Frame 11-26-2013   Past Surgical History:  Procedure Laterality Date  . ABLATION  11-26-2013   PVI by Dr Johney Frame  . arthroscopy       left knee    . ATRIAL FIBRILLATION ABLATION N/A 11/26/2013   Procedure: ATRIAL FIBRILLATION ABLATION;  Surgeon: Gardiner Rhyme, MD;  Location: [facility] CATH LAB;  Service: Cardiovascular;  Laterality: N/A;  . CARDIOVERSION N/A 10/01/2013   Procedure: CARDIOVERSION;  Surgeon: Laurey Morale, MD;  Location: [facility] ENDOSCOPY;  Service: Cardiovascular;  Laterality: N/A;  . CARDIOVERSION N/A 10/17/2013   Procedure: CARDIOVERSION;  Surgeon: Laurey Morale, MD;  Location: [facility] ENDOSCOPY;  Service: Cardiovascular;  Laterality: N/A;  . CARDIOVERSION N/A 02/20/2019   Procedure: CARDIOVERSION;  Surgeon: Laurey Morale, MD;  Location: [facility] ENDOSCOPY;  Service: Cardiovascular;  Laterality: N/A;  .  CARDIOVERSION N/A 11/19/2020   Procedure: CARDIOVERSION;  Surgeon: Laurey Morale, MD;  Location: [facility] ENDOSCOPY;  Service: Cardiovascular;  Laterality: N/A;  . CARDIOVERSION N/A 12/25/2020   Procedure: CARDIOVERSION;  Surgeon: Laurey Morale, MD;  Location: [facility] ENDOSCOPY;  Service: Cardiovascular;  Laterality: N/A;  . ESOPHAGOGASTRODUODENOSCOPY N/A 09/25/2013   Procedure: ESOPHAGOGASTRODUODENOSCOPY (EGD);  Surgeon: Graylin Shiver, MD;  Location: [facility] ENDOSCOPY;  Service: Endoscopy;  Laterality: N/A;  . fx left wrist     open reduction & internal fixaction  . LEFT HEART CATHETERIZATION WITH CORONARY ANGIOGRAM  09/26/2013   Procedure: LEFT HEART CATHETERIZATION WITH CORONARY ANGIOGRAM;  Surgeon: Laurey Morale, MD;  Location: [facility] CATH LAB;  Service: Cardiovascular;;  . TEE WITHOUT CARDIOVERSION N/A 10/01/2013   Procedure: TRANSESOPHAGEAL ECHOCARDIOGRAM (TEE);  Surgeon: Laurey Morale, MD;  Location: [facility] ENDOSCOPY;  Service: Cardiovascular;  Laterality: N/A;  . TEE WITHOUT CARDIOVERSION N/A 11/26/2013   Procedure: TRANSESOPHAGEAL ECHOCARDIOGRAM (TEE);  Surgeon: Laurey Morale, MD;  Location: [facility] ENDOSCOPY;  Service: Cardiovascular;  Laterality: N/A;    ROS- all systems are reviewed and negatives except as per HPI above  Current Outpatient Medications  Medication Sig Dispense Refill  . acetaminophen (TYLENOL) 325 MG tablet Take 325-650 mg by mouth every 6 (six) hours as needed (pain.).    Marland Kitchen amiodarone (PACERONE) 200 MG tablet Take 1 tablet (200 mg total) by mouth daily. 60 tablet 3  . carvedilol (COREG) 12.5 MG tablet Take 1 tablet (12.5 mg total)  by mouth 2 (two) times daily with a meal. 180 tablet 3  . ELIQUIS 5 MG TABS tablet TAKE 1 TABLET BY MOUTH  TWICE DAILY 180 tablet 3  . famotidine (PEPCID) 20 MG tablet TAKE 1 TABLET 2 TIMES DAILY. MUST KEEP PENDING APPOINTMENT 180 tablet 0  . losartan (COZAAR) 25 MG tablet TAKE 1 TABLET BY MOUTH TWICE A DAY 180 tablet 3  . Multiple Vitamins-Minerals  (MULTIVITAMIN WITH MINERALS) tablet Take 1 tablet by mouth daily. Centrum Multivitamin    . spironolactone (ALDACTONE) 25 MG tablet Take 1 tablet (25 mg total) by mouth daily. 90 tablet 3   No current facility-administered medications for this visit.    Physical Exam: Vitals:   01/18/21 1434  BP: 112/78  Pulse: 83  SpO2: 95%  Weight: 263 lb 3.2 oz (119.4 kg)  Height: 5' 3.5" (1.613 m)    GEN- The patient is obese appearing, alert and oriented x 3 today.   Head- normocephalic, atraumatic Eyes-  Sclera clear, conjunctiva pink Ears- hearing intact Oropharynx- clear Lungs-   normal work of breathing Heart- irregular rate and rhythm  GI- soft  Extremities- no clubbing, cyanosis, or edema  Wt Readings from Last 3 Encounters:  01/18/21 263 lb 3.2 oz (119.4 kg)  01/07/21 261 lb 9.6 oz (118.7 kg)  12/25/20 250 lb (113.4 kg)    EKG tracing ordered today is personally reviewed and shows afib  Assessment and Plan:  1. Persistent atrial fibrillation The patient has symptomatic, recurrent persistent atrial fibrillation. she has failed medical therapy with amiodarone. Chads2vasc score is 3.  she is anticoagulated with eliquis . Therapeutic strategies for afib including medicine and repeat ablation were discussed in detail with the patient today. Risk, benefits, and alternatives to EP study and radiofrequency ablation for afib were also discussed in detail today. These risks include but are not limited to stroke, bleeding, vascular damage, tamponade, perforation, damage to the esophagus, lungs, and other structures, pulmonary vein stenosis, worsening renal function, and death. The patient understands these risk and wishes to proceed.  We will therefore proceed with catheter ablation at the next available time.  Carto, ICE, anesthesia are requested for the procedure.  Will also obtain cardiac CT prior to the procedure to exclude LAA thrombus and further evaluate atrial anatomy.  2.  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 Morbid obesity Body mass index is 45.89 kg/m. I have reviewed the patients BMI and decreased success rates with ablation at length today.  Weight loss is strongly advised.  Per Guijian et al (PACE 2013; 36: 672-094), patients with BMI 25-29.9 (obese) have a 27% increase in AF recurrence post ablation.  Patients with BMI >30 have a 31% increase in AF recurrence post ablation when compared to those with BMI <25.   Risks, benefits and potential toxicities for medications prescribed and/or refilled reviewed with patient today.   Hillis Range MD, [facility] 01/18/2021 2:35 PM

## 2021-01-18 NOTE — Patient Instructions (Addendum)
Medication Instructions:  Your physician recommends that you continue on your current medications as directed. Please refer to the Current Medication list given to you today.  *If you need a refill on your cardiac medications before your next appointment, please call your pharmacy*   Lab Work: 2/11 at 11am None ordered If you have labs (blood work) drawn today and your tests are completely normal, you will receive your results only by: Marland Kitchen MyChart Message (if you have MyChart) OR . A paper copy in the mail If you have any lab test that is abnormal or we need to change your treatment, we will call you to review the results.   Testing/Procedures: Your physician has requested that you have cardiac CT. Cardiac computed tomography (CT) is a painless test that uses an x-ray machine to take clear, detailed pictures of your heart. For further information please visit [email or web address] Please follow instruction sheet as given.--see instructions Kelli Webster will call with time for test   Follow-Up: As printed here.   Cardiac Ablation--see instructions  Cardiac ablation is a procedure to destroy (ablate) some heart tissue that is sending bad signals. These bad signals cause problems in heart rhythm. The heart has many areas that make these signals. If there are problems in these areas, they can make the heart beat in a way that is not normal. Destroying some tissues can help make the heart rhythm normal. Tell your doctor about:  Any allergies you have.  All medicines you are taking. These include vitamins, herbs, eye drops, creams, and over-the-counter medicines.  Any problems you or family members have had with medicines that make you fall asleep (anesthetics).  Any blood disorders you have.  Any surgeries you have had.  Any medical conditions you have, such as kidney failure.  Whether you are pregnant or may be pregnant. What are the risks? This is a safe procedure. But problems may occur,  including:  Infection.  Bruising and bleeding.  Bleeding into the chest.  Stroke or blood clots.  Damage to nearby areas of your body.  Allergies to medicines or dyes.  The need for a pacemaker if the normal system is damaged.  Failure of the procedure to treat the problem. What happens before the procedure? Medicines Ask your doctor about:  Changing or stopping your normal medicines. This is important.  Taking aspirin and ibuprofen. Do not take these medicines unless your doctor tells you to take them.  Taking other medicines, vitamins, herbs, and supplements. General instructions  Follow instructions from your doctor about what you cannot eat or drink.  Plan to have someone take you home from the hospital or clinic.  If you will be going home right after the procedure, plan to have someone with you for 24 hours.  Ask your doctor what steps will be taken to prevent infection. What happens during the procedure?  An IV tube will be put into one of your veins.  You will be given a medicine to help you relax.  The skin on your neck or groin will be numbed.  A cut (incision) will be made in your neck or groin. A needle will be put through your cut and into a large vein.  A tube (catheter) will be put into the needle. The tube will be moved to your heart.  Dye may be put through the tube. This helps your doctor see your heart.  Small devices (electrodes) on the tube will send out signals.  A type of energy will  be used to destroy some heart tissue.  The tube will be taken out.  Pressure will be held on your cut. This helps stop bleeding.  A bandage will be put over your cut. The exact procedure may vary among doctors and hospitals.   What happens after the procedure?  You will be watched until you leave the hospital or clinic. This includes checking your heart rate, breathing rate, oxygen, and blood pressure.  Your cut will be watched for bleeding. You will  need to lie still for a few hours.  Do not drive for 24 hours or as long as your doctor tells you. Summary  Cardiac ablation is a procedure to destroy some heart tissue. This is done to treat heart rhythm problems.  Tell your doctor about any medical conditions you may have. Tell him or her about all medicines you are taking to treat them.  This is a safe procedure. But problems may occur. These include infection, bruising, bleeding, and damage to nearby areas of your body.  Follow what your doctor tells you about food and drink. You may also be told to change or stop some of your medicines.  After the procedure, do not drive for 24 hours or as long as your doctor tells you. This information is not intended to replace advice given to you by your health care provider. Make sure you discuss any questions you have with your health care provider. Document Revised: 11/07/2019 Document Reviewed: 11/07/2019 Elsevier Patient Education  2021 Elsevier Inc.     Thank you for choosing [employer] [employer]!!

## 2021-01-22 ENCOUNTER — Other Ambulatory Visit (HOSPITAL_COMMUNITY): Payer: Self-pay | Admitting: Cardiology

## 2021-01-29 ENCOUNTER — Other Ambulatory Visit: Payer: Self-pay

## 2021-01-29 ENCOUNTER — Other Ambulatory Visit: Payer: Medicare Other | Admitting: *Deleted

## 2021-01-29 DIAGNOSIS — I4819 Other persistent atrial fibrillation: Secondary | ICD-10-CM

## 2021-01-29 DIAGNOSIS — I4891 Unspecified atrial fibrillation: Secondary | ICD-10-CM

## 2021-01-29 DIAGNOSIS — D6869 Other thrombophilia: Secondary | ICD-10-CM

## 2021-01-29 LAB — CBC WITH DIFFERENTIAL/PLATELET
Basophils Absolute: 0.1 10*3/uL (ref 0.0–0.2)
Basos: 1 %
EOS (ABSOLUTE): 0.2 10*3/uL (ref 0.0–0.4)
Eos: 2 %
Hematocrit: 35 % (ref 34.0–46.6)
Hemoglobin: 11.2 g/dL (ref 11.1–15.9)
Immature Grans (Abs): 0 10*3/uL (ref 0.0–0.1)
Immature Granulocytes: 0 %
Lymphocytes Absolute: 1.5 10*3/uL (ref 0.7–3.1)
Lymphs: 21 %
MCH: 27.8 pg (ref 26.6–33.0)
MCHC: 32 g/dL (ref 31.5–35.7)
MCV: 87 fL (ref 79–97)
Monocytes Absolute: 0.5 10*3/uL (ref 0.1–0.9)
Monocytes: 7 %
Neutrophils Absolute: 4.8 10*3/uL (ref 1.4–7.0)
Neutrophils: 69 %
Platelets: 412 10*3/uL (ref 150–450)
RBC: 4.03 x10E6/uL (ref 3.77–5.28)
RDW: 14.1 % (ref 11.7–15.4)
WBC: 6.9 10*3/uL (ref 3.4–10.8)

## 2021-01-29 LAB — BASIC METABOLIC PANEL
BUN/Creatinine Ratio: 16 (ref 12–28)
BUN: 24 mg/dL (ref 8–27)
CO2: 20 mmol/L (ref 20–29)
Calcium: 8.8 mg/dL (ref 8.7–10.3)
Chloride: 104 mmol/L (ref 96–106)
Creatinine, Ser: 1.48 mg/dL — ABNORMAL HIGH (ref 0.57–1.00)
GFR calc Af Amer: 40 mL/min/{1.73_m2} — ABNORMAL LOW (ref >59)
GFR calc non Af Amer: 35 mL/min/{1.73_m2} — ABNORMAL LOW (ref >59)
Glucose: 148 mg/dL — ABNORMAL HIGH (ref 65–99)
Potassium: 4.7 mmol/L (ref 3.5–5.2)
Sodium: 139 mmol/L (ref 134–144)

## 2021-02-02 ENCOUNTER — Telehealth: Payer: Self-pay | Admitting: Internal Medicine

## 2021-02-02 NOTE — Telephone Encounter (Signed)
The patient has been notified of the result and verbalized understanding.  All questions (if any) were answered. Sampson Goon, RN 02/02/2021

## 2021-02-02 NOTE — Telephone Encounter (Signed)
Kelli Webster is returning Kelli Webster's call in regards to her lab results. Please advise.

## 2021-02-09 ENCOUNTER — Telehealth (HOSPITAL_COMMUNITY): Payer: Self-pay | Admitting: *Deleted

## 2021-02-09 NOTE — Telephone Encounter (Signed)
Reaching out to patient to offer assistance regarding upcoming cardiac imaging study; pt verbalizes understanding of appt date/time, parking situation and where to check in, pre-test NPO status, and verified current allergies; name and call back number provided for further questions should they arise  Larey Brick RN Navigator Cardiac Imaging [facility] [phone] office [phone] cell  Pt verbalized understanding to arrive at noon to admitting for her infusion center appointment.

## 2021-02-11 ENCOUNTER — Ambulatory Visit (HOSPITAL_COMMUNITY)
Admission: RE | Admit: 2021-02-11 | Discharge: 2021-02-11 | Disposition: A | Discharge status: Home / Self Care | Payer: Medicare Other | Source: Ambulatory Visit | Attending: Internal Medicine | Admitting: Internal Medicine

## 2021-02-11 ENCOUNTER — Other Ambulatory Visit: Payer: Self-pay

## 2021-02-11 DIAGNOSIS — I4891 Unspecified atrial fibrillation: Secondary | ICD-10-CM | POA: Insufficient documentation

## 2021-02-11 LAB — BASIC METABOLIC PANEL
Anion gap: 10 (ref 5–15)
BUN: 26 mg/dL — ABNORMAL HIGH (ref 8–23)
CO2: 23 mmol/L (ref 22–32)
Calcium: 9.1 mg/dL (ref 8.9–10.3)
Chloride: 102 mmol/L (ref 98–111)
Creatinine, Ser: 1.72 mg/dL — ABNORMAL HIGH (ref 0.44–1.00)
GFR, Estimated: 31 mL/min — ABNORMAL LOW (ref >60)
Glucose, Bld: 134 mg/dL — ABNORMAL HIGH (ref 70–99)
Potassium: 5 mmol/L (ref 3.5–5.1)
Sodium: 135 mmol/L (ref 135–145)

## 2021-02-11 MED ORDER — IOHEXOL 350 MG/ML SOLN
80.0000 mL | Freq: Once | INTRAVENOUS | Status: AC | PRN
  Administered 2021-02-11: 80 mL via INTRAVENOUS

## 2021-02-11 MED ORDER — SODIUM CHLORIDE 0.9 % WEIGHT BASED INFUSION
3.0000 mL/kg/h | INTRAVENOUS | Status: AC
  Administered 2021-02-11: 3 mL/kg/h via INTRAVENOUS

## 2021-02-11 MED ORDER — SODIUM CHLORIDE 0.9 % WEIGHT BASED INFUSION: 1.0000 mL/kg/h | INTRAVENOUS | Status: DC

## 2021-02-16 ENCOUNTER — Other Ambulatory Visit (HOSPITAL_COMMUNITY)
Admission: RE | Admit: 2021-02-16 | Discharge: 2021-02-16 | Disposition: A | Discharge status: Home / Self Care | Payer: Medicare Other | Source: Ambulatory Visit | Attending: Internal Medicine | Admitting: Internal Medicine

## 2021-02-16 DIAGNOSIS — Z20822 Contact with and (suspected) exposure to covid-19: Secondary | ICD-10-CM | POA: Diagnosis not present

## 2021-02-16 DIAGNOSIS — Z01812 Encounter for preprocedural laboratory examination: Secondary | ICD-10-CM | POA: Diagnosis present

## 2021-02-17 LAB — SARS CORONAVIRUS 2 (TAT 6-24 HRS): SARS Coronavirus 2: NEGATIVE

## 2021-02-17 NOTE — Anesthesia Preprocedure Evaluation (Addendum)
Anesthesia Evaluation  Patient identified by MRN, date of birth, ID band Patient awake    Reviewed: Allergy & Precautions, NPO status , Patient's Chart, lab work & pertinent test results, reviewed documented beta blocker date and time   History of Anesthesia Complications Negative for: history of anesthetic complications  Airway Mallampati: II  TM Distance: >3 FB Neck ROM: Full    Dental  (+) Dental Advisory Given   Pulmonary sleep apnea ,  02/16/2021 SARS coronavirus neg   breath sounds clear to auscultation       Cardiovascular hypertension, Pt. on medications and Pt. on home beta blockers (-) angina+ dysrhythmias Atrial Fibrillation  Rhythm:Irregular Rate:Normal  '21 ECHO:EF 40-45%, Grade 1 DD, mild MR, mild AI   Neuro/Psych negative neurological ROS     GI/Hepatic Neg liver ROS, GERD  Medicated and Controlled,  Endo/Other  Morbid obesity  Renal/GU Renal InsufficiencyRenal disease     Musculoskeletal  (+) Arthritis ,   Abdominal (+) + obese,   Peds  Hematology eliquis   Anesthesia Other Findings   Reproductive/Obstetrics                            Anesthesia Physical Anesthesia Plan  ASA: III  Anesthesia Plan: General   Post-op Pain Management:    Induction: Intravenous  PONV Risk Score and Plan: 3 and Ondansetron, Dexamethasone and Treatment may vary due to age or medical condition  Airway Management Planned: Oral ETT  Additional Equipment: None  Intra-op Plan:   Post-operative Plan: Extubation in OR  Informed Consent: I have reviewed the patients History and Physical, chart, labs and discussed the procedure including the risks, benefits and alternatives for the proposed anesthesia with the patient or authorized representative who has indicated his/her understanding and acceptance.     Dental advisory given  Plan Discussed with: CRNA and Surgeon  Anesthesia Plan  Comments:        Anesthesia Quick Evaluation

## 2021-02-17 NOTE — Progress Notes (Signed)
Instructed patient on the following items: Arrival time 0530 Nothing to eat or drink after midnight No meds AM of procedure Responsible person to drive you home and stay with you for 24 hrs Wash with special soap night before and morning of procedure If on anti-coagulant drug instructions Eliquis- hasn't missed any doses 

## 2021-02-18 ENCOUNTER — Ambulatory Visit (HOSPITAL_COMMUNITY): Payer: Medicare Other | Admitting: Anesthesiology

## 2021-02-18 ENCOUNTER — Encounter (HOSPITAL_COMMUNITY)
Admission: RE | Disposition: A | Discharge status: Home / Self Care | Payer: Self-pay | Source: Home / Self Care | Attending: Internal Medicine

## 2021-02-18 ENCOUNTER — Other Ambulatory Visit: Payer: Self-pay

## 2021-02-18 ENCOUNTER — Ambulatory Visit (HOSPITAL_COMMUNITY)
Admission: RE | Admit: 2021-02-18 | Discharge: 2021-02-18 | Disposition: A | Discharge status: Home / Self Care | Payer: Medicare Other | Attending: Internal Medicine | Admitting: Internal Medicine

## 2021-02-18 DIAGNOSIS — Z79899 Other long term (current) drug therapy: Secondary | ICD-10-CM | POA: Diagnosis not present

## 2021-02-18 DIAGNOSIS — Z7901 Long term (current) use of anticoagulants: Secondary | ICD-10-CM | POA: Insufficient documentation

## 2021-02-18 DIAGNOSIS — I4819 Other persistent atrial fibrillation: Secondary | ICD-10-CM

## 2021-02-18 DIAGNOSIS — Z6841 Body Mass Index (BMI) 40.0 and over, adult: Secondary | ICD-10-CM | POA: Diagnosis not present

## 2021-02-18 DIAGNOSIS — I5022 Chronic systolic (congestive) heart failure: Secondary | ICD-10-CM | POA: Insufficient documentation

## 2021-02-18 HISTORY — PX: ATRIAL FIBRILLATION ABLATION: EP1191

## 2021-02-18 SURGERY — ATRIAL FIBRILLATION ABLATION
Anesthesia: General

## 2021-02-18 MED ORDER — SODIUM CHLORIDE 0.9 % IV SOLN: 250.0000 mL | INTRAVENOUS | Status: DC | PRN

## 2021-02-18 MED ORDER — ROCURONIUM BROMIDE 10 MG/ML (PF) SYRINGE
PREFILLED_SYRINGE | INTRAVENOUS | Status: DC | PRN
  Administered 2021-02-18: 30 mg via INTRAVENOUS
  Administered 2021-02-18: 70 mg via INTRAVENOUS

## 2021-02-18 MED ORDER — ACETAMINOPHEN 500 MG PO TABS
1000.0000 mg | ORAL_TABLET | Freq: Once | ORAL | Status: AC
  Administered 2021-02-18: 1000 mg via ORAL
  Filled 2021-02-18 (×2): qty 2

## 2021-02-18 MED ORDER — PROPOFOL 10 MG/ML IV BOLUS
INTRAVENOUS | Status: DC | PRN
  Administered 2021-02-18: 100 mg via INTRAVENOUS

## 2021-02-18 MED ORDER — PROTAMINE SULFATE 10 MG/ML IV SOLN
INTRAVENOUS | Status: DC | PRN
  Administered 2021-02-18 (×2): 10 mg via INTRAVENOUS

## 2021-02-18 MED ORDER — HEPARIN SODIUM (PORCINE) 1000 UNIT/ML IJ SOLN
INTRAMUSCULAR | Status: AC
  Filled 2021-02-18: qty 2

## 2021-02-18 MED ORDER — HEPARIN (PORCINE) IN NACL 1000-0.9 UT/500ML-% IV SOLN
INTRAVENOUS | Status: DC | PRN
  Administered 2021-02-18: 500 mL

## 2021-02-18 MED ORDER — HEPARIN (PORCINE) IN NACL 1000-0.9 UT/500ML-% IV SOLN
INTRAVENOUS | Status: AC
  Filled 2021-02-18: qty 500

## 2021-02-18 MED ORDER — PHENYLEPHRINE 40 MCG/ML (10ML) SYRINGE FOR IV PUSH (FOR BLOOD PRESSURE SUPPORT)
PREFILLED_SYRINGE | INTRAVENOUS | Status: DC | PRN
  Administered 2021-02-18: 40 ug via INTRAVENOUS
  Administered 2021-02-18: 120 ug via INTRAVENOUS

## 2021-02-18 MED ORDER — ONDANSETRON HCL 4 MG/2ML IJ SOLN
INTRAMUSCULAR | Status: DC | PRN
  Administered 2021-02-18: 4 mg via INTRAVENOUS

## 2021-02-18 MED ORDER — PROPOFOL 10 MG/ML IV BOLUS
INTRAVENOUS | Status: AC
  Filled 2021-02-18: qty 20

## 2021-02-18 MED ORDER — LACTATED RINGERS IV SOLN: INTRAVENOUS | Status: DC | PRN

## 2021-02-18 MED ORDER — PHENYLEPHRINE HCL-NACL 10-0.9 MG/250ML-% IV SOLN
INTRAVENOUS | Status: DC | PRN
  Administered 2021-02-18: 50 ug/min via INTRAVENOUS

## 2021-02-18 MED ORDER — ONDANSETRON HCL 4 MG/2ML IJ SOLN: 4.0000 mg | Freq: Four times a day (QID) | INTRAMUSCULAR | Status: DC | PRN

## 2021-02-18 MED ORDER — SODIUM CHLORIDE 0.9% FLUSH: 3.0000 mL | INTRAVENOUS | Status: DC | PRN

## 2021-02-18 MED ORDER — FENTANYL CITRATE (PF) 100 MCG/2ML IJ SOLN
INTRAMUSCULAR | Status: DC | PRN
  Administered 2021-02-18: 100 ug via INTRAVENOUS

## 2021-02-18 MED ORDER — ACETAMINOPHEN 325 MG PO TABS: 650.0000 mg | ORAL_TABLET | ORAL | Status: DC | PRN

## 2021-02-18 MED ORDER — HYDROCODONE-ACETAMINOPHEN 5-325 MG PO TABS
1.0000 | ORAL_TABLET | ORAL | Status: DC | PRN
Start: 2021-02-18 — End: 2021-02-18

## 2021-02-18 MED ORDER — HEPARIN SODIUM (PORCINE) 1000 UNIT/ML IJ SOLN
INTRAMUSCULAR | Status: DC | PRN
  Administered 2021-02-18: 1000 [IU] via INTRAVENOUS
  Administered 2021-02-18: 2000 [IU] via INTRAVENOUS

## 2021-02-18 MED ORDER — PANTOPRAZOLE SODIUM 40 MG PO TBEC: 40.0000 mg | DELAYED_RELEASE_TABLET | Freq: Every day | ORAL | 0 refills | Status: DC

## 2021-02-18 MED ORDER — SUGAMMADEX SODIUM 200 MG/2ML IV SOLN
INTRAVENOUS | Status: DC | PRN
  Administered 2021-02-18: 200 mg via INTRAVENOUS
  Administered 2021-02-18: 400 mg via INTRAVENOUS

## 2021-02-18 MED ORDER — SODIUM CHLORIDE 0.9 % IV SOLN: INTRAVENOUS | Status: DC

## 2021-02-18 MED ORDER — SODIUM CHLORIDE 0.9% FLUSH: 3.0000 mL | Freq: Two times a day (BID) | INTRAVENOUS | Status: DC

## 2021-02-18 MED ORDER — APIXABAN 5 MG PO TABS
5.0000 mg | ORAL_TABLET | Freq: Once | ORAL | Status: AC
  Administered 2021-02-18: 5 mg via ORAL
  Filled 2021-02-18: qty 1

## 2021-02-18 MED ORDER — HEPARIN SODIUM (PORCINE) 1000 UNIT/ML IJ SOLN
INTRAMUSCULAR | Status: DC | PRN
  Administered 2021-02-18: 15000 [IU] via INTRAVENOUS
  Administered 2021-02-18: 1000 [IU] via INTRAVENOUS

## 2021-02-18 MED ORDER — LIDOCAINE 2% (20 MG/ML) 5 ML SYRINGE
INTRAMUSCULAR | Status: DC | PRN
  Administered 2021-02-18: 20 mg via INTRAVENOUS

## 2021-02-18 SURGICAL SUPPLY — 19 items
BLANKET WARM UNDERBOD FULL ACC (MISCELLANEOUS) ×2 IMPLANT
CATH MAPPNG PENTARAY F 2-6-2MM (CATHETERS) IMPLANT
CATH SMTCH THERMOCOOL SF DF (CATHETERS) ×1 IMPLANT
CATH SOUNDSTAR ECO 8FR (CATHETERS) ×1 IMPLANT
CATH WEBSTER BI DIR CS D-F CRV (CATHETERS) ×1 IMPLANT
CLOSURE PERCLOSE PROSTYLE (VASCULAR PRODUCTS) ×3 IMPLANT
COVER SWIFTLINK CONNECTOR (BAG) ×2 IMPLANT
NDL BAYLIS TRANSSEPTAL 71CM (NEEDLE) IMPLANT
NEEDLE BAYLIS TRANSSEPTAL 71CM (NEEDLE) ×2 IMPLANT
PACK EP LATEX FREE (CUSTOM PROCEDURE TRAY) ×2
PACK EP LF (CUSTOM PROCEDURE TRAY) ×1 IMPLANT
PAD PRO RADIOLUCENT 2001M-C (PAD) ×2 IMPLANT
PATCH CARTO3 (PAD) ×1 IMPLANT
PENTARAY F 2-6-2MM (CATHETERS) ×2
SHEATH PINNACLE 7F 10CM (SHEATH) ×2 IMPLANT
SHEATH PINNACLE 9F 10CM (SHEATH) ×1 IMPLANT
SHEATH PROBE COVER 6X72 (BAG) ×1 IMPLANT
SHEATH SWARTZ TS SL2 63CM 8.5F (SHEATH) ×1 IMPLANT
TUBING SMART ABLATE COOLFLOW (TUBING) ×1 IMPLANT

## 2021-02-18 NOTE — Anesthesia Postprocedure Evaluation (Signed)
Anesthesia Post Note  Patient: Kelli Webster  Procedure(s) Performed: ATRIAL FIBRILLATION ABLATION (N/A )     Patient location during evaluation: Phase II Anesthesia Type: General Level of consciousness: awake and alert, patient cooperative and oriented Pain management: pain level controlled Vital Signs Assessment: post-procedure vital signs reviewed and stable Respiratory status: spontaneous breathing, nonlabored ventilation and respiratory function stable Cardiovascular status: blood pressure returned to baseline and stable Postop Assessment: no apparent nausea or vomiting and adequate PO intake Anesthetic complications: no   No complications documented.  Last Vitals:  Vitals:   02/18/21 1230 02/18/21 1300  BP: (!) 111/49 (!) 121/47  Pulse: (!) 46 (!) 45  Resp: 14 13  Temp:    SpO2: 100% 97%    Last Pain:  Vitals:   02/18/21 1137  TempSrc:   PainSc: 0-No pain                 Dericka Ostenson,E. Jorryn Casagrande

## 2021-02-18 NOTE — H&P (Signed)
PCP: Barbie Banner, MD Primary Cardiologist: Dr Shirlee Latch Primary EP: Dr Johney Frame  Kelli Webster is a 75 y.o. female who presents today for routine electrophysiology study and ablation for afib.  Since last being seen in our clinic, the patient reports doing reasonably well.  She continues to have afib.  + fatigue, SOB and decreased exercise tolerance.  She was in sinus in July 2021 (EKG reviewed) but has had otherwise, quite frequent afib.  Today, she denies symptoms of palpitations, chest pain,   lower extremity edema, dizziness, presyncope, or syncope.  The patient is otherwise without complaint today.       Past Medical History:  Diagnosis Date  . Chronic systolic heart failure (HCC)    a. ECHO (09/2013) EF 20-25% diff HK, mild/mod MR, LA mod dil, RV mildly dil, RA mildly dil b. R/LHC (09/26/13) AO 133/88, LV 137/17, Lmain no dz, LAD 30% prox, LCX mild luminal irreg; RCA mild luminal irreg  . GERD (gastroesophageal reflux disease)    a. 09/2013 endoscopy nl  . Morbid obesity (HCC)   . Osteoarthritis   . Persistent atrial fibrillation (HCC) Chads2vascscore of at least 4(02/12/15)   a. Dx 09/2013 b. s/p PVI by Dr Johney Frame 11-26-2013        Past Surgical History:  Procedure Laterality Date  . ABLATION  11-26-2013   PVI by Dr Johney Frame  . arthroscopy       left knee    . ATRIAL FIBRILLATION ABLATION N/A 11/26/2013   Procedure: ATRIAL FIBRILLATION ABLATION;  Surgeon: Gardiner Rhyme, MD;  Location: [facility] CATH LAB;  Service: Cardiovascular;  Laterality: N/A;  . CARDIOVERSION N/A 10/01/2013   Procedure: CARDIOVERSION;  Surgeon: Laurey Morale, MD;  Location: [facility] ENDOSCOPY;  Service: Cardiovascular;  Laterality: N/A;  . CARDIOVERSION N/A 10/17/2013   Procedure: CARDIOVERSION;  Surgeon: Laurey Morale, MD;  Location: [facility] ENDOSCOPY;  Service: Cardiovascular;  Laterality: N/A;  . CARDIOVERSION N/A 02/20/2019   Procedure: CARDIOVERSION;  Surgeon: Laurey Morale, MD;  Location: [facility] ENDOSCOPY;   Service: Cardiovascular;  Laterality: N/A;  . CARDIOVERSION N/A 11/19/2020   Procedure: CARDIOVERSION;  Surgeon: Laurey Morale, MD;  Location: [facility] ENDOSCOPY;  Service: Cardiovascular;  Laterality: N/A;  . CARDIOVERSION N/A 12/25/2020   Procedure: CARDIOVERSION;  Surgeon: Laurey Morale, MD;  Location: [facility] ENDOSCOPY;  Service: Cardiovascular;  Laterality: N/A;  . ESOPHAGOGASTRODUODENOSCOPY N/A 09/25/2013   Procedure: ESOPHAGOGASTRODUODENOSCOPY (EGD);  Surgeon: Graylin Shiver, MD;  Location: [facility] ENDOSCOPY;  Service: Endoscopy;  Laterality: N/A;  . fx left wrist     open reduction & internal fixaction  . LEFT HEART CATHETERIZATION WITH CORONARY ANGIOGRAM  09/26/2013   Procedure: LEFT HEART CATHETERIZATION WITH CORONARY ANGIOGRAM;  Surgeon: Laurey Morale, MD;  Location: [facility] CATH LAB;  Service: Cardiovascular;;  . TEE WITHOUT CARDIOVERSION N/A 10/01/2013   Procedure: TRANSESOPHAGEAL ECHOCARDIOGRAM (TEE);  Surgeon: Laurey Morale, MD;  Location: [facility] ENDOSCOPY;  Service: Cardiovascular;  Laterality: N/A;  . TEE WITHOUT CARDIOVERSION N/A 11/26/2013   Procedure: TRANSESOPHAGEAL ECHOCARDIOGRAM (TEE);  Surgeon: Laurey Morale, MD;  Location: [facility] ENDOSCOPY;  Service: Cardiovascular;  Laterality: N/A;    ROS- all systems are reviewed and negatives except as per HPI above        Current Outpatient Medications  Medication Sig Dispense Refill  . acetaminophen (TYLENOL) 325 MG tablet Take 325-650 mg by mouth every 6 (six) hours as needed (pain.).    Marland Kitchen amiodarone (PACERONE) 200 MG tablet Take 1 tablet (200 mg total) by  mouth daily. 60 tablet 3  . carvedilol (COREG) 12.5 MG tablet Take 1 tablet (12.5 mg total) by mouth 2 (two) times daily with a meal. 180 tablet 3  . ELIQUIS 5 MG TABS tablet TAKE 1 TABLET BY MOUTH  TWICE DAILY 180 tablet 3  . famotidine (PEPCID) 20 MG tablet TAKE 1 TABLET 2 TIMES DAILY. MUST KEEP PENDING APPOINTMENT 180 tablet 0  . losartan (COZAAR) 25 MG tablet TAKE 1 TABLET BY  MOUTH TWICE A DAY 180 tablet 3  . Multiple Vitamins-Minerals (MULTIVITAMIN WITH MINERALS) tablet Take 1 tablet by mouth daily. Centrum Multivitamin    . spironolactone (ALDACTONE) 25 MG tablet Take 1 tablet (25 mg total) by mouth daily. 90 tablet 3   No current facility-administered medications for this visit.    Physical Exam: Vitals:   02/18/21 0545  BP: (!) 156/67  Pulse: 71  Resp: 18  Temp: 98 F (36.7 C)  SpO2: 99%    GEN- The patient is obese appearing, alert and oriented x 3 today.   Head- normocephalic, atraumatic Eyes-  Sclera clear, conjunctiva pink Ears- hearing intact Oropharynx- clear Lungs-   normal work of breathing Heart- irregular rate and rhythm  GI- soft  Extremities- no clubbing, cyanosis, or edema     Wt Readings from Last 3 Encounters:  01/18/21 263 lb 3.2 oz (119.4 kg)  01/07/21 261 lb 9.6 oz (118.7 kg)  12/25/20 250 lb (113.4 kg)    Assessment and Plan:  1. Persistent atrial fibrillation The patient has symptomatic, recurrent persistent atrial fibrillation. she has failed medical therapy with amiodarone. Chads2vasc score is 3.  she is anticoagulated with eliquis .  Cardiac CT reviewed with her at length today. She reports compliance with eliquis without interruption.    Risk, benefits, and alternatives to EP study and radiofrequency ablation for afib were also discussed in detail today. These risks include but are not limited to stroke, bleeding, vascular damage, tamponade, perforation, damage to the esophagus, lungs, and other structures, pulmonary vein stenosis, worsening renal function, and death. The patient understands these risk and wishes to proceed.    Hillis Range MD, [facility] 02/18/2021 7:27 AM

## 2021-02-18 NOTE — Discharge Instructions (Signed)
Post procedure care instructions No driving for 4 days. No lifting over 5 lbs for 1 week. No vigorous or sexual activity for 1 week. You may return to work/your usual activities on 02/26/21. Keep procedure site clean & dry. If you notice increased pain, swelling, bleeding or pus, call/return!  You may shower after 24 hours, but no soaking in baths/hot tubs/pools for 1 week.      Cardiac Ablation, Care After  This sheet gives you information about how to care for yourself after your procedure. Your health care provider may also give you more specific instructions. If you have problems or questions, contact your health care provider. What can I expect after the procedure? After the procedure, it is common to have:  Bruising around your puncture site.  Tenderness around your puncture site.  Skipped heartbeats.  Tiredness (fatigue).  Follow these instructions at home: Puncture site care   Follow instructions from your health care provider about how to take care of your puncture site. Make sure you: ? If present, leave stitches (sutures), skin glue, or adhesive strips in place. These skin closures may need to stay in place for up to 2 weeks. If adhesive strip edges start to loosen and curl up, you may trim the loose edges. Do not remove adhesive strips completely unless your health care provider tells you to do that. ? If a square bandage is present, this may be removed in 24 hours.   Check your puncture site every day for signs of infection. Check for: ? Redness, swelling, or pain. ? Fluid or blood. If your puncture site starts to bleed, lie down on your back, apply firm pressure to the area, and contact your health care provider. ? Warmth. ? Pus or a bad smell. Driving  Do not drive for at least 4 days after your procedure or however long your health care provider recommends. (Do not resume driving if you have previously been instructed not to drive for other health reasons.)  Do not  drive or use heavy machinery while taking prescription pain medicine. Activity  Avoid activities that take a lot of effort for at least 7 days after your procedure.  Do not lift anything that is heavier than 5 lb (4.5 kg) for one week.   No sexual activity for 1 week.   Return to your normal activities as told by your health care provider. Ask your health care provider what activities are safe for you. General instructions  Take over-the-counter and prescription medicines only as told by your health care provider.  Do not use any products that contain nicotine or tobacco, such as cigarettes and e-cigarettes. If you need help quitting, ask your health care provider.  You may shower after 24 hours, but Do not take baths, swim, or use a hot tub for 1 week.   Do not drink alcohol for 24 hours after your procedure.  Keep all follow-up visits as told by your health care provider. This is important. Contact a health care provider if:  You have redness, mild swelling, or pain around your puncture site.  You have fluid or blood coming from your puncture site that stops after applying firm pressure to the area.  Your puncture site feels warm to the touch.  You have pus or a bad smell coming from your puncture site.  You have a fever.  You have chest pain or discomfort that spreads to your neck, jaw, or arm.  You are sweating a lot.  You  feel nauseous.  You have a fast or irregular heartbeat.  You have shortness of breath.  You are dizzy or light-headed and feel the need to lie down.  You have pain or numbness in the arm or leg closest to your puncture site. Get help right away if:  Your puncture site suddenly swells.  Your puncture site is bleeding and the bleeding does not stop after applying firm pressure to the area. These symptoms may represent a serious problem that is an emergency. Do not wait to see if the symptoms will go away. Get medical help right away. Call your  local emergency services (911 in the [place] Do not drive yourself to the hospital. Summary  After the procedure, it is normal to have bruising and tenderness at the puncture site in your groin, neck, or forearm.  Check your puncture site every day for signs of infection.  Get help right away if your puncture site is bleeding and the bleeding does not stop after applying firm pressure to the area. This is a medical emergency. This information is not intended to replace advice given to you by your health care provider. Make sure you discuss any questions you have with your health care provider.   You have an appointment set up with the Atrial Fibrillation Clinic.  Multiple studies have shown that being followed by a dedicated atrial fibrillation clinic in addition to the standard care you receive from your other physicians improves health. We believe that enrollment in the atrial fibrillation clinic will allow [place] to better care for you.   The phone number to the Atrial Fibrillation Clinic is [phone]. The clinic is staffed Monday through Friday from 8:30am to 5pm.  Parking Directions: The clinic is located in the Heart and Vascular Building connected to [facility] 1)From [address] turn on to [address] and go to the 3rd entrance  (Heart and Vascular entrance) on the right. 2)Look to the right for Heart &Vascular Parking Garage. 3)A code for the entrance is required, for April is 2231.   4)Take the elevators to the 1st floor. Registration is in the room with the glass walls at the end of the hallway.  If you have any trouble parking or locating the clinic, please don't hesitate to call [phone].

## 2021-02-18 NOTE — Anesthesia Procedure Notes (Signed)
Procedure Name: Intubation Date/Time: 02/18/2021 7:53 AM Performed by: Lynnell Chad, CRNA Pre-anesthesia Checklist: Patient identified, Emergency Drugs available, Suction available and Patient being monitored Patient Re-evaluated:Patient Re-evaluated prior to induction Oxygen Delivery Method: Circle System Utilized Preoxygenation: Pre-oxygenation with 100% oxygen Induction Type: IV induction Ventilation: Mask ventilation without difficulty Laryngoscope Size: Miller and 2 Grade View: Grade I Tube type: Oral Tube size: 7.0 mm Number of attempts: 1 Airway Equipment and Method: Stylet and Oral airway Placement Confirmation: ETT inserted through vocal cords under direct vision,  positive ETCO2 and breath sounds checked- equal and bilateral Secured at: 21 cm Tube secured with: Tape Dental Injury: Teeth and Oropharynx as per pre-operative assessment

## 2021-02-18 NOTE — Transfer of Care (Signed)
Immediate Anesthesia Transfer of Care Note  Patient: Kelli Webster  Procedure(s) Performed: ATRIAL FIBRILLATION ABLATION (N/A )  Patient Location: Cath Lab  Anesthesia Type:General  Level of Consciousness: awake and patient cooperative  Airway & Oxygen Therapy: Patient Spontanous Breathing and Patient connected to nasal cannula oxygen  Post-op Assessment: Report given to RN and Post -op Vital signs reviewed and stable  Post vital signs: Reviewed and stable  Last Vitals:  Vitals Value Taken Time  BP 97/69 02/18/21 1050  Temp 36 C 02/18/21 1048  Pulse 45 02/18/21 1052  Resp 14 02/18/21 1052  SpO2 100 % 02/18/21 1052  Vitals shown include unvalidated device data.  Last Pain:  Vitals:   02/18/21 1048  TempSrc: Temporal  PainSc: 0-No pain         Complications: No complications documented.

## 2021-02-19 ENCOUNTER — Encounter (HOSPITAL_COMMUNITY): Payer: Self-pay | Admitting: Internal Medicine

## 2021-02-19 LAB — POCT ACTIVATED CLOTTING TIME
Activated Clotting Time: 333 seconds
Activated Clotting Time: 338 seconds

## 2021-03-21 ENCOUNTER — Other Ambulatory Visit (HOSPITAL_COMMUNITY): Payer: Self-pay | Admitting: Cardiology

## 2021-03-24 ENCOUNTER — Encounter (HOSPITAL_COMMUNITY): Payer: Self-pay | Admitting: Nurse Practitioner

## 2021-03-24 ENCOUNTER — Ambulatory Visit (HOSPITAL_COMMUNITY)
Admission: RE | Admit: 2021-03-24 | Discharge: 2021-03-24 | Disposition: A | Discharge status: Home / Self Care | Payer: Medicare Other | Source: Ambulatory Visit | Attending: Nurse Practitioner | Admitting: Nurse Practitioner

## 2021-03-24 ENCOUNTER — Other Ambulatory Visit: Payer: Self-pay

## 2021-03-24 VITALS — BP 132/78 | HR 90 | Ht 63.5 in | Wt 259.6 lb

## 2021-03-24 DIAGNOSIS — Z79899 Other long term (current) drug therapy: Secondary | ICD-10-CM | POA: Insufficient documentation

## 2021-03-24 DIAGNOSIS — I509 Heart failure, unspecified: Secondary | ICD-10-CM | POA: Diagnosis not present

## 2021-03-24 DIAGNOSIS — Z7901 Long term (current) use of anticoagulants: Secondary | ICD-10-CM | POA: Diagnosis not present

## 2021-03-24 DIAGNOSIS — I4819 Other persistent atrial fibrillation: Secondary | ICD-10-CM | POA: Diagnosis present

## 2021-03-24 DIAGNOSIS — D6869 Other thrombophilia: Secondary | ICD-10-CM

## 2021-03-24 DIAGNOSIS — I484 Atypical atrial flutter: Secondary | ICD-10-CM

## 2021-03-24 DIAGNOSIS — Z8249 Family history of ischemic heart disease and other diseases of the circulatory system: Secondary | ICD-10-CM | POA: Diagnosis not present

## 2021-03-24 NOTE — Progress Notes (Signed)
Primary Care Physician: Barbie Banner, MD Referring Physician: Dr. Johney Frame Cardiologist: Dr. Ramonita Lab Rane is a 75 y.o. female with a h/o, HF,  Afib, s/p ablation 2014,on short term amiodarone at that time, that was seen by Dr. Johney Frame 01/21/19 and found to be in afib. She reported that she was under a lot of stress and noted an increase in heart rate then from the 60's to the 80's which has been consistent since that time. Dr. Johney Frame wanted her to have a cardioversion at his visit but she pushed back stating that they had not been successful in the past. Dr. Johney Frame also mentioned that admission for tikosyn was also an option. After much discussion, pt would like to proceed with cardioversion, not ready for a hospitlization for repeat abaltion, understanding that it might not be successful long term.   F/u in afib clinic, 11/24/20. Pt noted return to afib and contacted Dr. Shirlee Latch and was given a short load of amiodarone and set up for cardioversion. It was successful but she noted return to afib this am.  She is rate controlled. She  is not interested in another ablation at at his time. She  would like to load longer on amiodarone and try another cardioversion in several weeks.   F/u in afib clinic, 12/08/20, she has been loading on amiodarone longer at 200 mg bid for failing  previous cardioversion with a short amio load. She wanted one more chance at cardioversion. She is tolerating well. She is rate controlled. She  would prefer for this to be scheduled after the first of the year with Dr. Shirlee Latch.    F/u in afib clinic, 01/08/20. She had a successful cardioversion 12/26/19 and unfortunately she is back in afib, rate controlled. Despite that she feels improved. She continues on amiodarone at 200 mg bid, will reduce this back to 200 mg daily. She reports that Dr. Shirlee Latch does not want her to remain in afib 2/2 her heart function. She had an ablation in 2014 and she is wanting to discuss an repeat  ablation with Dr. Johney Frame.    F/u in afib clinic, 03/24/21. She is one month out from ablation and feels well but appears to be in atypical atrial flutter, rate controlled. She is very surprised as her HR's have been in the 70's/80's.   Today, she denies symptoms of palpitations, chest pain, shortness of breath, orthopnea, PND, lower extremity edema, dizziness, presyncope, syncope, or neurologic sequela. The patient is tolerating medications without difficulties and is otherwise without complaint today.   Past Medical History:  Diagnosis Date  . Chronic systolic heart failure (HCC)    a. ECHO (09/2013) EF 20-25% diff HK, mild/mod MR, LA mod dil, RV mildly dil, RA mildly dil b. R/LHC (09/26/13) AO 133/88, LV 137/17, Lmain no dz, LAD 30% prox, LCX mild luminal irreg; RCA mild luminal irreg  . GERD (gastroesophageal reflux disease)    a. 09/2013 endoscopy nl  . Morbid obesity (HCC)   . Osteoarthritis   . Persistent atrial fibrillation (HCC) Chads2vascscore of at least 4(02/12/15)   a. Dx 09/2013 b. s/p PVI by Dr Johney Frame 11-26-2013   Past Surgical History:  Procedure Laterality Date  . ABLATION  11-26-2013   PVI by Dr Johney Frame  . arthroscopy       left knee    . ATRIAL FIBRILLATION ABLATION N/A 11/26/2013   Procedure: ATRIAL FIBRILLATION ABLATION;  Surgeon: Gardiner Rhyme, MD;  Location: [facility] CATH LAB;  Service:  Cardiovascular;  Laterality: N/A;  . ATRIAL FIBRILLATION ABLATION N/A 02/18/2021   Procedure: ATRIAL FIBRILLATION ABLATION;  Surgeon: Hillis Range, MD;  Location: [facility] INVASIVE CV LAB;  Service: Cardiovascular;  Laterality: N/A;  . CARDIOVERSION N/A 10/01/2013   Procedure: CARDIOVERSION;  Surgeon: Laurey Morale, MD;  Location: [facility] ENDOSCOPY;  Service: Cardiovascular;  Laterality: N/A;  . CARDIOVERSION N/A 10/17/2013   Procedure: CARDIOVERSION;  Surgeon: Laurey Morale, MD;  Location: [facility] ENDOSCOPY;  Service: Cardiovascular;  Laterality: N/A;  . CARDIOVERSION N/A 02/20/2019   Procedure:  CARDIOVERSION;  Surgeon: Laurey Morale, MD;  Location: [facility] ENDOSCOPY;  Service: Cardiovascular;  Laterality: N/A;  . CARDIOVERSION N/A 11/19/2020   Procedure: CARDIOVERSION;  Surgeon: Laurey Morale, MD;  Location: [facility] ENDOSCOPY;  Service: Cardiovascular;  Laterality: N/A;  . CARDIOVERSION N/A 12/25/2020   Procedure: CARDIOVERSION;  Surgeon: Laurey Morale, MD;  Location: [facility] ENDOSCOPY;  Service: Cardiovascular;  Laterality: N/A;  . ESOPHAGOGASTRODUODENOSCOPY N/A 09/25/2013   Procedure: ESOPHAGOGASTRODUODENOSCOPY (EGD);  Surgeon: Graylin Shiver, MD;  Location: [facility] ENDOSCOPY;  Service: Endoscopy;  Laterality: N/A;  . fx left wrist     open reduction & internal fixaction  . LEFT HEART CATHETERIZATION WITH CORONARY ANGIOGRAM  09/26/2013   Procedure: LEFT HEART CATHETERIZATION WITH CORONARY ANGIOGRAM;  Surgeon: Laurey Morale, MD;  Location: [facility] CATH LAB;  Service: Cardiovascular;;  . TEE WITHOUT CARDIOVERSION N/A 10/01/2013   Procedure: TRANSESOPHAGEAL ECHOCARDIOGRAM (TEE);  Surgeon: Laurey Morale, MD;  Location: [facility] ENDOSCOPY;  Service: Cardiovascular;  Laterality: N/A;  . TEE WITHOUT CARDIOVERSION N/A 11/26/2013   Procedure: TRANSESOPHAGEAL ECHOCARDIOGRAM (TEE);  Surgeon: Laurey Morale, MD;  Location: [facility] ENDOSCOPY;  Service: Cardiovascular;  Laterality: N/A;    Current Outpatient Medications  Medication Sig Dispense Refill  . acetaminophen (TYLENOL) 325 MG tablet Take 325-650 mg by mouth every 6 (six) hours as needed for moderate pain.    Marland Kitchen amiodarone (PACERONE) 200 MG tablet TAKE 1 TABLET (200 MG TOTAL) BY MOUTH DAILY. NEED APPT FOR FUTURE REFILLS 60 tablet 0  . carvedilol (COREG) 12.5 MG tablet Take 1 tablet (12.5 mg total) by mouth 2 (two) times daily with a meal. 180 tablet 3  . ELIQUIS 5 MG TABS tablet TAKE 1 TABLET BY MOUTH  TWICE DAILY 180 tablet 3  . famotidine (PEPCID) 20 MG tablet TAKE 1 TABLET 2 TIMES DAILY. MUST KEEP PENDING APPOINTMENT 180 tablet 0  . losartan (COZAAR) 25 MG tablet  TAKE 1 TABLET BY MOUTH TWICE A DAY 180 tablet 3  . Multiple Vitamins-Minerals (MULTIVITAMIN WITH MINERALS) tablet Take 1 tablet by mouth daily. Centrum Multivitamin    . pantoprazole (PROTONIX) 40 MG tablet Take 1 tablet (40 mg total) by mouth daily. 45 tablet 0  . spironolactone (ALDACTONE) 25 MG tablet Take 1 tablet (25 mg total) by mouth daily. 90 tablet 3   No current facility-administered medications for this encounter.    No Known Allergies  Social History   Socioeconomic History  . Marital status: Married    Spouse name: Not on file  . Number of children: Not on file  . Years of education: Not on file  . Highest education level: Not on file  Occupational History  . Not on file  Tobacco Use  . Smoking status: Never Smoker  . Smokeless tobacco: Never Used  Vaping Use  . Vaping Use: Never used  Substance and Sexual Activity  . Alcohol use: No  . Drug use: No  . Sexual activity: Yes  Birth control/protection: Post-menopausal  Other Topics Concern  . Not on file  Social History Narrative   Lives in [place] with husband.  Does not routinely exercise.   Realtor with [employer]   Social Determinants of Health   Financial Resource Strain: Not on file  Food Insecurity: Not on file  Transportation Needs: Not on file  Physical Activity: Not on file  Stress: Not on file  Social Connections: Not on file  Intimate Partner Violence: Not on file    Family History  Problem Relation Age of Onset  . Heart attack Father         multiple MI's  . Healthy Mother   . Healthy Brother   . Healthy Sister   . Healthy Sister     ROS- All systems are reviewed and negative except as per the HPI above  Physical Exam: There were no vitals filed for this visit. Wt Readings from Last 3 Encounters:  02/18/21 113.4 kg  02/11/21 113.4 kg  01/18/21 119.4 kg    Labs: Lab Results  Component Value Date   NA 135 02/11/2021   K 5.0 02/11/2021   CL 102 02/11/2021   CO2 23 02/11/2021    GLUCOSE 134 (H) 02/11/2021   BUN 26 (H) 02/11/2021   CREATININE 1.72 (H) 02/11/2021   CALCIUM 9.1 02/11/2021   MG 1.8 09/26/2013   Lab Results  Component Value Date   INR 1.09 09/23/2013   Lab Results  Component Value Date   CHOL 213 (H) 09/30/2015   HDL 56 09/30/2015   LDLCALC 127 (H) 09/30/2015   TRIG 150 (H) 09/30/2015     GEN- The patient is well appearing, alert and oriented x 3 today.   Head- normocephalic, atraumatic Eyes-  Sclera clear, conjunctiva pink Ears- hearing intact Oropharynx- clear Neck- supple, no JVP Lymph- no cervical lymphadenopathy Lungs- Clear to ausculation bilaterally, normal work of breathing Heart - irregular rate and rhythm, no murmurs, rubs or gallops, PMI not laterally displaced GI- soft, NT, ND, + BS Extremities- no clubbing, cyanosis, or edema MS- no significant deformity or atrophy Skin- no rash or lesion Psych- euthymic mood, full affect Neuro- strength and sensation are intact  EKG-atypical atrial flutter  at 90 bpm, qrs int 130 ms, qtc 453 ms   Echo-1. Left ventricular ejection fraction, by visual estimation, is 40 to 45%. The left ventricle has mild to moderately decreased function. There is no left ventricular hypertrophy. Diffuse hypokinesis. 2. Left ventricular diastolic parameters are consistent with Grade I diastolic dysfunction (impaired relaxation). 3. Global right ventricle has normal systolic function.The right ventricular size is normal. No increase in right ventricular wall thickness. 4. Left atrial size was normal. 5. Right atrial size was normal. 6. The mitral valve is normal in structure. Mild mitral valve regurgitation. 7. The aortic valve is tricuspid. Aortic valve regurgitation is mild. 8. The tricuspid valve is normal in structure. Tricuspid valve regurgitation is trivial. 9. The pulmonic valve was not well visualized. Pulmonic valve regurgitation is trivial. 10. TR signal is inadequate for assessing pulmonary  artery systolic pressure.   Assessment and Plan: 1. Persistent  afib/flutter  Successful cardioversion 12/21 but with ERAF Repeat cardioversion was successful 1/7 after a longer amiodarone load but ERAF Now one month s/p ablation and in a rate controlled atrial flutter, pt was unaware  Will repeat EKG early nest week and if still out of rhythm will schedule for cardioversion  Continue  amiodarone at 200 mg daily    2.  CHA2DS2VASc of at least 3 Continue  eliquis 5 mg qd   2. HF Weight stable Avoid salt    F/u early next week with repeat EKG   Kelli [facility] [address] [place], [place] [address] [phone]

## 2021-03-27 ENCOUNTER — Other Ambulatory Visit: Payer: Self-pay | Admitting: Internal Medicine

## 2021-03-29 ENCOUNTER — Encounter (HOSPITAL_COMMUNITY): Payer: Medicare Other | Admitting: Nurse Practitioner

## 2021-03-31 ENCOUNTER — Encounter (HOSPITAL_COMMUNITY): Payer: Medicare Other | Admitting: Nurse Practitioner

## 2021-04-08 ENCOUNTER — Other Ambulatory Visit: Payer: Self-pay

## 2021-04-08 ENCOUNTER — Ambulatory Visit (HOSPITAL_COMMUNITY)
Admission: RE | Admit: 2021-04-08 | Discharge: 2021-04-08 | Disposition: A | Discharge status: Home / Self Care | Payer: Medicare Other | Source: Ambulatory Visit | Attending: Nurse Practitioner | Admitting: Nurse Practitioner

## 2021-04-08 DIAGNOSIS — I498 Other specified cardiac arrhythmias: Secondary | ICD-10-CM | POA: Diagnosis not present

## 2021-04-08 DIAGNOSIS — I484 Atypical atrial flutter: Secondary | ICD-10-CM | POA: Insufficient documentation

## 2021-04-08 DIAGNOSIS — I454 Nonspecific intraventricular block: Secondary | ICD-10-CM | POA: Diagnosis not present

## 2021-04-08 NOTE — Progress Notes (Signed)
Pt in for EKG after last visit showing possible flutter. She is around 6 weeks post ablation. Today's EKG reviewed with Dr. Johney Frame shows sinus rhythm at 86 bpm. No changes needed. F/u with Dr Johney Frame as scheduled. BP 126/78.

## 2021-05-15 ENCOUNTER — Other Ambulatory Visit (HOSPITAL_COMMUNITY): Payer: Self-pay | Admitting: Cardiology

## 2021-05-26 ENCOUNTER — Ambulatory Visit: Payer: Medicare Other | Admitting: Internal Medicine

## 2021-05-26 DIAGNOSIS — I4819 Other persistent atrial fibrillation: Secondary | ICD-10-CM

## 2021-05-26 DIAGNOSIS — I428 Other cardiomyopathies: Secondary | ICD-10-CM

## 2021-06-23 ENCOUNTER — Other Ambulatory Visit (HOSPITAL_COMMUNITY): Payer: Self-pay | Admitting: Cardiology

## 2021-07-08 ENCOUNTER — Other Ambulatory Visit (HOSPITAL_COMMUNITY): Payer: Self-pay | Admitting: Cardiology

## 2021-07-23 ENCOUNTER — Other Ambulatory Visit (HOSPITAL_COMMUNITY): Payer: Self-pay | Admitting: Cardiology

## 2021-07-26 ENCOUNTER — Ambulatory Visit: Payer: Medicare Other | Admitting: Internal Medicine

## 2021-07-26 ENCOUNTER — Other Ambulatory Visit: Payer: Self-pay

## 2021-07-26 VITALS — BP 118/72 | HR 84 | Ht 63.5 in | Wt 258.6 lb

## 2021-07-26 DIAGNOSIS — I428 Other cardiomyopathies: Secondary | ICD-10-CM

## 2021-07-26 DIAGNOSIS — I4819 Other persistent atrial fibrillation: Secondary | ICD-10-CM | POA: Diagnosis not present

## 2021-07-26 NOTE — Patient Instructions (Addendum)
Medication Instructions:  Stop Amiodarone  Your physician recommends that you continue on your current medications as directed. Please refer to the Current Medication list given to you today.  Labwork: None ordered.  Testing/Procedures: None ordered.  Follow-Up: Your physician wants you to follow-up in: 3 months with the Afib Clinic. They will contact you to schedule.   Any Other Special Instructions Will Be Listed Below (If Applicable).  If you need a refill on your cardiac medications before your next appointment, please call your pharmacy.

## 2021-07-26 NOTE — Progress Notes (Signed)
PCP: Barbie Banner, MD Primary Cardiologist: Dr Shirlee Latch  Kelli Webster is a 75 y.o. female who presents today for routine electrophysiology followup.  Since his recent afib ablation, the patient reports doing very well.  she denies procedure related complications and is pleased with the results of the procedure.  She has been mostly in afib/ atypical atrial flutter by [facility] post ablation but is unaware.  Reports steady controlled heart rates at home.  Energy is good.  Today, she denies symptoms of palpitations, chest pain, shortness of breath,  lower extremity edema, dizziness, presyncope, or syncope.  The patient is otherwise without complaint today.   Past Medical History:  Diagnosis Date   Chronic systolic heart failure (HCC)    a. ECHO (09/2013) EF 20-25% diff HK, mild/mod MR, LA mod dil, RV mildly dil, RA mildly dil b. R/LHC (09/26/13) AO 133/88, LV 137/17, Lmain no dz, LAD 30% prox, LCX mild luminal irreg; RCA mild luminal irreg   GERD (gastroesophageal reflux disease)    a. 09/2013 endoscopy nl   Morbid obesity (HCC)    Osteoarthritis    Persistent atrial fibrillation (HCC) Chads2vascscore of at least 4(02/12/15)   a. Dx 09/2013 b. s/p PVI by Dr Johney Frame 11-26-2013   Past Surgical History:  Procedure Laterality Date   ABLATION  11-26-2013   PVI by Dr Johney Frame   arthroscopy       left knee     ATRIAL FIBRILLATION ABLATION N/A 11/26/2013   Procedure: ATRIAL FIBRILLATION ABLATION;  Surgeon: Gardiner Rhyme, MD;  Location: [facility] CATH LAB;  Service: Cardiovascular;  Laterality: N/A;   ATRIAL FIBRILLATION ABLATION N/A 02/18/2021   Procedure: ATRIAL FIBRILLATION ABLATION;  Surgeon: Hillis Range, MD;  Location: [facility] INVASIVE CV LAB;  Service: Cardiovascular;  Laterality: N/A;   CARDIOVERSION N/A 10/01/2013   Procedure: CARDIOVERSION;  Surgeon: Laurey Morale, MD;  Location: [facility] ENDOSCOPY;  Service: Cardiovascular;  Laterality: N/A;   CARDIOVERSION N/A 10/17/2013   Procedure: CARDIOVERSION;  Surgeon:  Laurey Morale, MD;  Location: [facility] ENDOSCOPY;  Service: Cardiovascular;  Laterality: N/A;   CARDIOVERSION N/A 02/20/2019   Procedure: CARDIOVERSION;  Surgeon: Laurey Morale, MD;  Location: [facility] ENDOSCOPY;  Service: Cardiovascular;  Laterality: N/A;   CARDIOVERSION N/A 11/19/2020   Procedure: CARDIOVERSION;  Surgeon: Laurey Morale, MD;  Location: [facility] ENDOSCOPY;  Service: Cardiovascular;  Laterality: N/A;   CARDIOVERSION N/A 12/25/2020   Procedure: CARDIOVERSION;  Surgeon: Laurey Morale, MD;  Location: [facility] ENDOSCOPY;  Service: Cardiovascular;  Laterality: N/A;   ESOPHAGOGASTRODUODENOSCOPY N/A 09/25/2013   Procedure: ESOPHAGOGASTRODUODENOSCOPY (EGD);  Surgeon: Graylin Shiver, MD;  Location: [facility] ENDOSCOPY;  Service: Endoscopy;  Laterality: N/A;   fx left wrist     open reduction & internal fixaction   LEFT HEART CATHETERIZATION WITH CORONARY ANGIOGRAM  09/26/2013   Procedure: LEFT HEART CATHETERIZATION WITH CORONARY ANGIOGRAM;  Surgeon: Laurey Morale, MD;  Location: [facility] CATH LAB;  Service: Cardiovascular;;   TEE WITHOUT CARDIOVERSION N/A 10/01/2013   Procedure: TRANSESOPHAGEAL ECHOCARDIOGRAM (TEE);  Surgeon: Laurey Morale, MD;  Location: [facility] ENDOSCOPY;  Service: Cardiovascular;  Laterality: N/A;   TEE WITHOUT CARDIOVERSION N/A 11/26/2013   Procedure: TRANSESOPHAGEAL ECHOCARDIOGRAM (TEE);  Surgeon: Laurey Morale, MD;  Location: [facility] ENDOSCOPY;  Service: Cardiovascular;  Laterality: N/A;    ROS- all systems are personally reviewed and negatives except as per HPI above  Current Outpatient Medications  Medication Sig Dispense Refill   acetaminophen (TYLENOL) 325 MG tablet Take 325-650 mg by mouth every  6 (six) hours as needed for moderate pain.     carvedilol (COREG) 12.5 MG tablet TAKE 1 TABLET (12.5 MG TOTAL) BY MOUTH 2 (TWO) TIMES DAILY WITH A MEAL. 180 tablet 3   ELIQUIS 5 MG TABS tablet TAKE 1 TABLET BY MOUTH  TWICE DAILY 180 tablet 3   famotidine (PEPCID) 20 MG tablet TAKE 1 TABLET 2 TIMES  DAILY. MUST KEEP PENDING APPOINTMENT 180 tablet 0   losartan (COZAAR) 25 MG tablet TAKE 1 TABLET BY MOUTH TWICE A DAY 180 tablet 3   Multiple Vitamins-Minerals (MULTIVITAMIN WITH MINERALS) tablet Take 1 tablet by mouth daily. Centrum Multivitamin     pantoprazole (PROTONIX) 40 MG tablet TAKE 1 TABLET BY MOUTH EVERY DAY 90 tablet 3   spironolactone (ALDACTONE) 25 MG tablet Take 1 tablet (25 mg total) by mouth daily. 90 tablet 3   No current facility-administered medications for this visit.    Physical Exam: Vitals:   07/26/21 1047  BP: 118/72  Pulse: 84  SpO2: 96%  Weight: 258 lb 9.6 oz (117.3 kg)  Height: 5' 3.5" (1.613 m)    GEN- The patient is well appearing, alert and oriented x 3 today.   Head- normocephalic, atraumatic Eyes-  Sclera clear, conjunctiva pink Ears- hearing intact Oropharynx- clear Lungs- Clear to ausculation bilaterally, normal work of breathing Heart- irregular rate and rhythm  GI- soft, NT, ND, + BS Extremities- no clubbing, cyanosis, or edema  EKG tracing ordered today is personally reviewed and shows coarse afib today  Assessment and Plan:  1. Persistent atrial fibrillation/ atypical atrial flutter Doing well s/p ablation but appears to be persistently in coarse afib by ekgs 4/22 and today.  She is unaware.  Ekg 04/08/21 was felt to be sinus, however on my review, appears to be atypical atrial flutter. chads2vasc score is 3.  She is on eliquis. Given severe LA enlargement, I think that our ability to maintain sinus long term is quite low.  Fortunately, she is asymptomatic and rate controlled. I will therefore stop amiodarone today. I did offer Zio monitor which she declines.  2. Obesity Body mass index is 45.09 kg/m. Lifestyle modification is advised  3. Chronic combined systolic and diastolic CHF Euvolemic today NYHA Class II She has scheduled follow-up with Dr Shirlee Latch.   Return to AF clinic in 3 months  Hillis Range MD, [facility] 07/26/2021 11:12  AM

## 2021-08-17 ENCOUNTER — Encounter (HOSPITAL_COMMUNITY): Payer: Medicare Other | Admitting: Cardiology

## 2021-08-20 ENCOUNTER — Other Ambulatory Visit (HOSPITAL_COMMUNITY): Payer: Self-pay | Admitting: Cardiology

## 2021-08-23 ENCOUNTER — Other Ambulatory Visit (HOSPITAL_COMMUNITY): Payer: Self-pay | Admitting: Cardiology

## 2021-10-19 ENCOUNTER — Ambulatory Visit (HOSPITAL_COMMUNITY)
Admission: RE | Admit: 2021-10-19 | Discharge: 2021-10-19 | Disposition: A | Discharge status: Home / Self Care | Payer: Medicare Other | Source: Ambulatory Visit | Attending: Cardiology | Admitting: Cardiology

## 2021-10-19 ENCOUNTER — Other Ambulatory Visit: Payer: Self-pay

## 2021-10-19 ENCOUNTER — Encounter (HOSPITAL_COMMUNITY): Payer: Self-pay | Admitting: Cardiology

## 2021-10-19 ENCOUNTER — Other Ambulatory Visit (HOSPITAL_COMMUNITY): Payer: Self-pay

## 2021-10-19 VITALS — BP 122/68 | HR 81 | Wt 252.8 lb

## 2021-10-19 DIAGNOSIS — Z7901 Long term (current) use of anticoagulants: Secondary | ICD-10-CM | POA: Diagnosis not present

## 2021-10-19 DIAGNOSIS — E669 Obesity, unspecified: Secondary | ICD-10-CM | POA: Insufficient documentation

## 2021-10-19 DIAGNOSIS — Z6841 Body Mass Index (BMI) 40.0 and over, adult: Secondary | ICD-10-CM | POA: Insufficient documentation

## 2021-10-19 DIAGNOSIS — I484 Atypical atrial flutter: Secondary | ICD-10-CM | POA: Diagnosis not present

## 2021-10-19 DIAGNOSIS — I5022 Chronic systolic (congestive) heart failure: Secondary | ICD-10-CM | POA: Diagnosis present

## 2021-10-19 DIAGNOSIS — Z7984 Long term (current) use of oral hypoglycemic drugs: Secondary | ICD-10-CM | POA: Insufficient documentation

## 2021-10-19 DIAGNOSIS — G4733 Obstructive sleep apnea (adult) (pediatric): Secondary | ICD-10-CM | POA: Insufficient documentation

## 2021-10-19 DIAGNOSIS — Z8616 Personal history of COVID-19: Secondary | ICD-10-CM | POA: Insufficient documentation

## 2021-10-19 DIAGNOSIS — Z79899 Other long term (current) drug therapy: Secondary | ICD-10-CM | POA: Insufficient documentation

## 2021-10-19 LAB — BASIC METABOLIC PANEL
Anion gap: 9 (ref 5–15)
BUN: 29 mg/dL — ABNORMAL HIGH (ref 8–23)
CO2: 25 mmol/L (ref 22–32)
Calcium: 9.6 mg/dL (ref 8.9–10.3)
Chloride: 102 mmol/L (ref 98–111)
Creatinine, Ser: 2.01 mg/dL — ABNORMAL HIGH (ref 0.44–1.00)
GFR, Estimated: 26 mL/min — ABNORMAL LOW (ref >60)
Glucose, Bld: 142 mg/dL — ABNORMAL HIGH (ref 70–99)
Potassium: 5.7 mmol/L — ABNORMAL HIGH (ref 3.5–5.1)
Sodium: 136 mmol/L (ref 135–145)

## 2021-10-19 LAB — BRAIN NATRIURETIC PEPTIDE: B Natriuretic Peptide: 148.2 pg/mL — ABNORMAL HIGH (ref 0.0–100.0)

## 2021-10-19 MED ORDER — AMIODARONE HCL 200 MG PO TABS: ORAL_TABLET | ORAL | 3 refills | Status: DC

## 2021-10-19 MED ORDER — DAPAGLIFLOZIN PROPANEDIOL 10 MG PO TABS: 10.0000 mg | ORAL_TABLET | Freq: Every day | ORAL | 11 refills | Status: DC

## 2021-10-19 NOTE — Patient Instructions (Addendum)
EKG done today.  Labs done today. We will contact you only if your labs are abnormal.  START Amiodarone 200mg  (1 tablet) by mouth 2 times daily for 2 weeks then take 1 tablet by mouth daily.   START Farxiga 10mg  (1 tablet) by mouth daily.   No other medication changes were made. Please continue all current medications as prescribed.  Your physician recommends that you schedule a follow-up appointment in: 10 days for a lab only appointment and in 3 months with Dr.  If you have any questions or concerns before your next appointment please send a message through [facility] or call our office at [phone].    TO LEAVE A MESSAGE FOR THE NURSE SELECT OPTION 2, PLEASE LEAVE A MESSAGE INCLUDING: YOUR NAME DATE OF BIRTH CALL BACK NUMBER REASON FOR CALL**this is important as we prioritize the call backs  YOU WILL RECEIVE A CALL BACK THE SAME DAY AS LONG AS YOU CALL BEFORE 4:00 PM   Do the following things EVERYDAY: Weigh yourself in the morning before breakfast. Write it down and keep it in a log. Take your medicines as prescribed Eat low salt foods--Limit salt (sodium) to 2000 mg per day.  Stay as active as you can everyday Limit all fluids for the day to less than 2 liters   At the [facility], you and your health needs are our priority. As part of our continuing mission to provide you with exceptional heart care, we have created designated Provider Care Teams. These Care Teams include your primary Cardiologist (physician) and Advanced Practice Providers (APPs- Physician Assistants and Nurse Practitioners) who all work together to provide you with the care you need, when you need it.   You may see any of the following providers on your designated Care Team at your next follow up: Dr Warren Grant Dr [phone], NP Arvilla Meres, Carron Curie Robbie Lis, PharmD   Please be sure to bring in all your medications bottles to every appointment.    You are scheduled for a Cardioversion on Wednesday November 30th 2022 with Dr. Monday.  Please arrive at the [facility] (Main Entrance A) at [facility]: [address] [place], [address] [place] at 11:00 am. (1 hour prior to procedure unless lab work is needed; if lab work is needed arrive 1.5 hours ahead)  DIET: Nothing to eat or drink after midnight except a sip of water with medications (see medication instructions below)  FYI: For your safety, and to allow [place] to monitor your vital signs accurately during the surgery/procedure we request that   if you have artificial nails, gel coating, SNS etc. Please have those removed prior to your surgery/procedure. Not having the nail coverings /polish removed may result in cancellation or delay of your surgery/procedure.  You must have a responsible person to drive you home and stay in the waiting area during your procedure. Failure to do so could result in cancellation.  Bring your insurance cards.  *Special Note: Every effort is made to have your procedure done on time. Occasionally there are emergencies that occur at the hospital that may cause delays. Please be patient if a delay does occur.

## 2021-10-19 NOTE — Progress Notes (Signed)
Medication Samples have been provided to the patient.  Drug name: Marcelline Deist       Strength: 10mg         Qty: 4  LOT:  Exp.Date: 04/17/24,11/18/23  Dosing instructions: TAKE 1 TAB PO QD  The patient has been instructed regarding the correct time, dose, and frequency of taking this medication, including desired effects and most common side effects.   Markella Dao R Salimata Christenson 11:58 AM 10/19/2021

## 2021-10-20 ENCOUNTER — Telehealth (HOSPITAL_COMMUNITY): Payer: Self-pay

## 2021-10-20 DIAGNOSIS — I5022 Chronic systolic (congestive) heart failure: Secondary | ICD-10-CM

## 2021-10-20 MED ORDER — LOSARTAN POTASSIUM 25 MG PO TABS: 25.0000 mg | ORAL_TABLET | Freq: Every day | ORAL | 3 refills | Status: DC

## 2021-10-20 NOTE — Telephone Encounter (Signed)
Pt aware of results and medication recommendations to stop spironolactone and hold losartan for 1 day then restart once daily.  Pt will return to clinic on 10/22/2021 to repeat blood work. Verbalized understanding.

## 2021-10-20 NOTE — Telephone Encounter (Signed)
-----   Message from Laurey Morale, MD sent at 10/19/2021  4:17 PM EDT ----- K and creatinine higher . She needs to stop spironolactone.  Hold losartan for a day then restart at 25 mg once daily.  BMET on Thursday or Friday.  Can continue amiodarone and SGLT2 inhibitor.

## 2021-10-20 NOTE — H&P (Signed)
Patient ID: Kelli Webster, female   DOB: 05-16-1946, 75 y.o.   MRN: [id number] PCP: Dr. Andrey Campanile EP: Dr Johney Frame HF MD: Dr Shirlee Latch.   75 y.o. woman with h/o obesity, persistent atrial fibrillation/flutter, moderate OSA (cannot tolerate CPAP) and systolic HF.  She was admitted in 09/2013 with severe dyspnea for several days and was found to have atrial fibrillation with RVR and volume overload.  EF 20-25% on echo, diffuse hypokinesis.   She was cardioverted twice but both times went back into atrial fibrillation, the 2nd time on amiodarone.    She had an atrial fibrillation ablation in 12/14 that was successful.  TEE in 12/14 showed EF 30%.  Repeat echo in 4/15 showed EF up to 45%.  Echo in 6/17 showed EF down to 25-30%.  Echo in 6/18 showed EF back up to 45-50%.  In 3/20 she had recurrent atrial fibrillation and was cardioverted.  In 11/20, echo showed EF 40-45% with normal RV.   She was cardioverted again in 12/21 and 1/22.  In 3/22, she had redo atrial fibrillation ablation.  However, she remained in atypical atrial flutter and Dr. Johney Frame stopped her amiodarone in 8/22.  She had COVID-19 in 8/22.   She returns for followup of CHF and atrial arrhythmias.  Weight is down 12 lbs since last time I saw her. No palpitations, but she remains in atypical atrial flutter today.  She fatigues easily but denies exertional dyspnea or chest pain.  No orthopnea/PND.  Main problem is sciatica.  She has not missed Eliquis.    ECG (personally reviewed): Atypical atrial flutter  Labs (4/15): K 5.2 creatinine 1.6, digoxin 1.0  Labs (11/15): K 5.1, creatinine 1.4 Labs 09/2015: K 4.6 Creatinine 1.53  Labs (8/17): K 4.8, creatinine 1.53, hgb 11.6 Labs (10/17): K 4.7, creatinine 1.37, BNP 74 Labs (2/18): K 4.9, creatinine 1.47, hgb 11.5 Labs (7/18): hgb 11.6, K 4.5, creatinine 1.5 Labs (2/20): K 5, creatinine 1.6 Labs (2/22): K 5, creatinine 1.72   PMH:  1. Obesity 2. GERD 3. OA 4. EGD negative in 10/14 5. ACEI  cough 6. Atrial fibrillation: First diagnosed in 10/14.  DCCV to NSR in 09/2013 but atrial fibrillation recurred. Cardioversion 10/18/2013 successful on amiodarone but back in atrial fibrillation in 11/14.  Atrial fibrillation ablation ([facility]) 12/14.  - DCCV to NSR in 3/20, 12/21, 1/22 - Redo atrial fibrillation ablation in 3/22.   7. Cardiomyopathy: Echo (10/14) with EF 20-25%, diffuse HK.  LHC (10/14) showed no CAD.  TSH normal, HIV negative, SPEP/UPEP negative.  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  TEE (12/14) with EF 30%, diffuse hypokinesis, mild MR, mildly decreased RV systolic function.  Echo (4/15) with EF 45%, diffuse hypokinesis, mildly dilated LV, normal RV size and systolic function.  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 - Echo (6/17) with EF 25-30%, mild MR.  - Echo (6/18) with EF 45-50%, mildly dilated LV, mild MR.  - Echo (11/20) with EF 40-45%, normal RV.  8. Sinus bradycardia 9. OSA: Cannot tolerate CPAP. 10. CKD stage 3 11. Low back pain/sciatica.  12. COVID-19 8/22  SH: Married, lives in [place], nonsmoker.   FH: No history of cardiomyopathy or premature CAD.   ROS: All systems reviewed and negative except as per HPI.   Current Outpatient Medications  Medication Sig Dispense Refill   acetaminophen (TYLENOL) 325 MG tablet Take 325-650 mg by mouth every 6 (six) hours as needed for moderate pain.     amiodarone (PACERONE) 200 MG tablet Take 1 tablet by  mouth 2 times daily for 2 weeks then take 1 tablet by mouth daily. 104 tablet 3   carvedilol (COREG) 12.5 MG tablet TAKE 1 TABLET (12.5 MG TOTAL) BY MOUTH 2 (TWO) TIMES DAILY WITH A MEAL. 180 tablet 3   dapagliflozin propanediol (FARXIGA) 10 MG TABS tablet Take 1 tablet (10 mg total) by mouth daily before breakfast. 30 tablet 11   ELIQUIS 5 MG TABS tablet TAKE 1 TABLET BY MOUTH  TWICE DAILY 180 tablet 3   famotidine (PEPCID) 20 MG tablet Take 20 mg by mouth as needed for heartburn or indigestion.     losartan (COZAAR)  25 MG tablet TAKE 1 TABLET BY MOUTH TWICE A DAY 180 tablet 3   Multiple Vitamins-Minerals (MULTIVITAMIN WITH MINERALS) tablet Take 1 tablet by mouth daily. Centrum Multivitamin     spironolactone (ALDACTONE) 25 MG tablet Take 1 tablet (25 mg total) by mouth daily. 90 tablet 3   No current facility-administered medications for this encounter.    Vitals:   10/19/21 1124  BP: 122/68  Pulse: 81  SpO2: 100%  Weight: 114.7 kg (252 lb 12.8 oz)   General: NAD Neck: No JVD, no thyromegaly or thyroid nodule.  Lungs: Clear to auscultation bilaterally with normal respiratory effort. CV: Nondisplaced PMI.  Heart regular S1/S2, no S3/S4, no murmur.  No peripheral edema.  No carotid bruit.  Normal pedal pulses.  Abdomen: Soft, nontender, no hepatosplenomegaly, no distention.  Skin: Intact without lesions or rashes.  Neurologic: Alert and oriented x 3.  Psych: Normal affect. Extremities: No clubbing or cyanosis.  HEENT: Normal.   Assessment/Plan: 1. 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 Echo 05/2016 EF 25-30%, most recent Echo 9/20 with EF 40-45%.  She is now volume overloaded on exam, NYHA class II symptoms.  - She was unable to tolerate Entresto due to lightheadedness.  - Continue losartan 25 mg BID. We have tried to increase this in the past to 25 mg in the am and 50mg  in the pm but she did not tolerate it.  BMET today.  - Continue current Coreg and spironolactone.  - Start dapagliflozin vs Jardiance 10 mg daily with BMET in 10 days.    - I will arrange for echo to reassess LV function.  2. Atrial fibrillation/flutter: She is now in persistent atypical atrial flutter after redo atrial fibrillation ablation in 3/22.  Amiodarone was stopped in 8/22. She does not feel palpitations.  - She asks if it would be reasonable to try again to get her back in NSR.  With low EF, I think it is worth a try.  She will need an anti-arrhythmic to have any chance of  staying in NSR.  I will restart amiodarone 200 mg bid x 2 wks then 200 mg daily.  Will need to follow LFTs, TSH and will need a regular eye exam.  - Continue apixaban, has not missed doses.  - I will arrange for DCCV in 3 wks. We discussed risks/benefits and she agrees to procedure.  3. OSA: She has been diagnosed in the past but cannot tolerate CPAP.   Follow up APP in 6 wks.   Loralie Champagne 10/20/2021

## 2021-10-20 NOTE — Progress Notes (Deleted)
  The note originally documented on this encounter has been moved the the encounter in which it belongs.  

## 2021-10-22 ENCOUNTER — Encounter (HOSPITAL_COMMUNITY): Payer: Medicare Other

## 2021-10-22 ENCOUNTER — Ambulatory Visit (HOSPITAL_COMMUNITY)
Admission: RE | Admit: 2021-10-22 | Discharge: 2021-10-22 | Disposition: A | Discharge status: Home / Self Care | Payer: Medicare Other | Source: Ambulatory Visit | Attending: Cardiology | Admitting: Cardiology

## 2021-10-22 ENCOUNTER — Other Ambulatory Visit: Payer: Self-pay

## 2021-10-22 DIAGNOSIS — I5022 Chronic systolic (congestive) heart failure: Secondary | ICD-10-CM | POA: Insufficient documentation

## 2021-10-22 DIAGNOSIS — I429 Cardiomyopathy, unspecified: Secondary | ICD-10-CM | POA: Insufficient documentation

## 2021-10-22 DIAGNOSIS — Z8616 Personal history of COVID-19: Secondary | ICD-10-CM | POA: Insufficient documentation

## 2021-10-22 DIAGNOSIS — R001 Bradycardia, unspecified: Secondary | ICD-10-CM | POA: Diagnosis not present

## 2021-10-22 DIAGNOSIS — G4730 Sleep apnea, unspecified: Secondary | ICD-10-CM | POA: Diagnosis not present

## 2021-10-22 DIAGNOSIS — E669 Obesity, unspecified: Secondary | ICD-10-CM | POA: Diagnosis not present

## 2021-10-22 DIAGNOSIS — I4891 Unspecified atrial fibrillation: Secondary | ICD-10-CM | POA: Insufficient documentation

## 2021-10-22 DIAGNOSIS — I340 Nonrheumatic mitral (valve) insufficiency: Secondary | ICD-10-CM | POA: Insufficient documentation

## 2021-10-22 DIAGNOSIS — I4892 Unspecified atrial flutter: Secondary | ICD-10-CM | POA: Diagnosis not present

## 2021-10-22 LAB — BASIC METABOLIC PANEL WITH GFR
Anion gap: 8 (ref 5–15)
BUN: 31 mg/dL — ABNORMAL HIGH (ref 8–23)
CO2: 26 mmol/L (ref 22–32)
Calcium: 9.3 mg/dL (ref 8.9–10.3)
Chloride: 101 mmol/L (ref 98–111)
Creatinine, Ser: 2.19 mg/dL — ABNORMAL HIGH (ref 0.44–1.00)
GFR, Estimated: 23 mL/min — ABNORMAL LOW (ref >60)
Glucose, Bld: 132 mg/dL — ABNORMAL HIGH (ref 70–99)
Potassium: 5.3 mmol/L — ABNORMAL HIGH (ref 3.5–5.1)
Sodium: 135 mmol/L (ref 135–145)

## 2021-10-22 LAB — ECHOCARDIOGRAM COMPLETE
Area-P 1/2: 4.44 cm2
S' Lateral: 4.8 cm

## 2021-10-28 ENCOUNTER — Other Ambulatory Visit (HOSPITAL_COMMUNITY): Payer: Self-pay

## 2021-10-28 DIAGNOSIS — I5022 Chronic systolic (congestive) heart failure: Secondary | ICD-10-CM

## 2021-10-28 NOTE — Progress Notes (Signed)
Orders Placed This Encounter  Procedures   Basic metabolic panel    Standing Status:   Future    Standing Expiration Date:   10/28/2022    Order Specific Question:   Release to patient    Answer:   Immediate

## 2021-10-29 ENCOUNTER — Other Ambulatory Visit: Payer: Self-pay

## 2021-10-29 ENCOUNTER — Telehealth (HOSPITAL_COMMUNITY): Payer: Self-pay

## 2021-10-29 ENCOUNTER — Ambulatory Visit (HOSPITAL_COMMUNITY)
Admission: RE | Admit: 2021-10-29 | Discharge: 2021-10-29 | Disposition: A | Discharge status: Home / Self Care | Payer: Medicare Other | Source: Ambulatory Visit | Attending: Internal Medicine | Admitting: Internal Medicine

## 2021-10-29 DIAGNOSIS — I5022 Chronic systolic (congestive) heart failure: Secondary | ICD-10-CM | POA: Insufficient documentation

## 2021-10-29 LAB — BASIC METABOLIC PANEL
Anion gap: 10 (ref 5–15)
BUN: 28 mg/dL — ABNORMAL HIGH (ref 8–23)
CO2: 24 mmol/L (ref 22–32)
Calcium: 8.6 mg/dL — ABNORMAL LOW (ref 8.9–10.3)
Chloride: 105 mmol/L (ref 98–111)
Creatinine, Ser: 2.19 mg/dL — ABNORMAL HIGH (ref 0.44–1.00)
GFR, Estimated: 23 mL/min — ABNORMAL LOW (ref >60)
Glucose, Bld: 146 mg/dL — ABNORMAL HIGH (ref 70–99)
Potassium: 4.6 mmol/L (ref 3.5–5.1)
Sodium: 139 mmol/L (ref 135–145)

## 2021-10-29 NOTE — Telephone Encounter (Signed)
Patient advised and verbalized understanding,lab appointment scheduled,lab orders entered  Orders Placed This Encounter  Procedures   Basic metabolic panel    Standing Status:   Future    Standing Expiration Date:   10/29/2022    Order Specific Question:   Release to patient    Answer:   Immediate

## 2021-10-29 NOTE — Telephone Encounter (Signed)
-----   Message from Laurey Morale, MD sent at 10/29/2021 12:24 PM EST ----- No change but repeat BMET again in 10 days to trend. Increase fluid intake.

## 2021-11-08 ENCOUNTER — Encounter (HOSPITAL_COMMUNITY): Payer: Self-pay | Admitting: Cardiology

## 2021-11-09 ENCOUNTER — Ambulatory Visit (HOSPITAL_COMMUNITY)
Admission: RE | Admit: 2021-11-09 | Discharge: 2021-11-09 | Disposition: A | Discharge status: Home / Self Care | Payer: Medicare Other | Source: Ambulatory Visit | Attending: Family Medicine | Admitting: Family Medicine

## 2021-11-09 DIAGNOSIS — I5022 Chronic systolic (congestive) heart failure: Secondary | ICD-10-CM | POA: Insufficient documentation

## 2021-11-09 LAB — BASIC METABOLIC PANEL
Anion gap: 10 (ref 5–15)
BUN: 30 mg/dL — ABNORMAL HIGH (ref 8–23)
CO2: 24 mmol/L (ref 22–32)
Calcium: 8.8 mg/dL — ABNORMAL LOW (ref 8.9–10.3)
Chloride: 106 mmol/L (ref 98–111)
Creatinine, Ser: 1.97 mg/dL — ABNORMAL HIGH (ref 0.44–1.00)
GFR, Estimated: 26 mL/min — ABNORMAL LOW (ref >60)
Glucose, Bld: 120 mg/dL — ABNORMAL HIGH (ref 70–99)
Potassium: 4.7 mmol/L (ref 3.5–5.1)
Sodium: 140 mmol/L (ref 135–145)

## 2021-11-17 ENCOUNTER — Ambulatory Visit (HOSPITAL_COMMUNITY)
Admission: RE | Admit: 2021-11-17 | Discharge: 2021-11-17 | Disposition: A | Discharge status: Home / Self Care | Payer: Medicare Other | Attending: Cardiology | Admitting: Cardiology

## 2021-11-17 ENCOUNTER — Ambulatory Visit (HOSPITAL_COMMUNITY): Payer: Medicare Other | Admitting: Certified Registered"

## 2021-11-17 ENCOUNTER — Encounter (HOSPITAL_COMMUNITY): Payer: Self-pay | Admitting: Cardiology

## 2021-11-17 ENCOUNTER — Encounter (HOSPITAL_COMMUNITY)
Admission: RE | Disposition: A | Discharge status: Home / Self Care | Payer: Self-pay | Source: Home / Self Care | Attending: Cardiology

## 2021-11-17 ENCOUNTER — Other Ambulatory Visit: Payer: Self-pay

## 2021-11-17 DIAGNOSIS — N183 Chronic kidney disease, stage 3 unspecified: Secondary | ICD-10-CM | POA: Insufficient documentation

## 2021-11-17 DIAGNOSIS — Z6841 Body Mass Index (BMI) 40.0 and over, adult: Secondary | ICD-10-CM | POA: Insufficient documentation

## 2021-11-17 DIAGNOSIS — Z8616 Personal history of COVID-19: Secondary | ICD-10-CM | POA: Diagnosis not present

## 2021-11-17 DIAGNOSIS — I5022 Chronic systolic (congestive) heart failure: Secondary | ICD-10-CM | POA: Insufficient documentation

## 2021-11-17 DIAGNOSIS — M1990 Unspecified osteoarthritis, unspecified site: Secondary | ICD-10-CM | POA: Diagnosis not present

## 2021-11-17 DIAGNOSIS — Z7901 Long term (current) use of anticoagulants: Secondary | ICD-10-CM | POA: Diagnosis not present

## 2021-11-17 DIAGNOSIS — I4891 Unspecified atrial fibrillation: Secondary | ICD-10-CM | POA: Insufficient documentation

## 2021-11-17 DIAGNOSIS — Z79899 Other long term (current) drug therapy: Secondary | ICD-10-CM | POA: Diagnosis not present

## 2021-11-17 DIAGNOSIS — G4733 Obstructive sleep apnea (adult) (pediatric): Secondary | ICD-10-CM | POA: Diagnosis not present

## 2021-11-17 DIAGNOSIS — K219 Gastro-esophageal reflux disease without esophagitis: Secondary | ICD-10-CM | POA: Insufficient documentation

## 2021-11-17 DIAGNOSIS — I130 Hypertensive heart and chronic kidney disease with heart failure and stage 1 through stage 4 chronic kidney disease, or unspecified chronic kidney disease: Secondary | ICD-10-CM | POA: Insufficient documentation

## 2021-11-17 HISTORY — PX: CARDIOVERSION: SHX1299

## 2021-11-17 SURGERY — CARDIOVERSION
Anesthesia: General

## 2021-11-17 MED ORDER — SODIUM CHLORIDE 0.9 % IV SOLN: INTRAVENOUS | Status: DC

## 2021-11-17 MED ORDER — AMIODARONE HCL 200 MG PO TABS: 200.0000 mg | ORAL_TABLET | Freq: Every day | ORAL | 6 refills | Status: DC

## 2021-11-17 MED ORDER — LIDOCAINE 2% (20 MG/ML) 5 ML SYRINGE
INTRAMUSCULAR | Status: DC | PRN
  Administered 2021-11-17: 40 mg via INTRAVENOUS

## 2021-11-17 MED ORDER — CARVEDILOL 12.5 MG PO TABS: 6.2500 mg | ORAL_TABLET | Freq: Two times a day (BID) | ORAL | 3 refills | Status: DC

## 2021-11-17 MED ORDER — EPHEDRINE SULFATE-NACL 50-0.9 MG/10ML-% IV SOSY
PREFILLED_SYRINGE | INTRAVENOUS | Status: DC | PRN
  Administered 2021-11-17: 10 mg via INTRAVENOUS

## 2021-11-17 MED ORDER — PROPOFOL 10 MG/ML IV BOLUS
INTRAVENOUS | Status: DC | PRN
  Administered 2021-11-17: 70 mg via INTRAVENOUS

## 2021-11-17 MED ORDER — SODIUM CHLORIDE 0.9 % IV SOLN: INTRAVENOUS | Status: DC | PRN

## 2021-11-17 NOTE — Anesthesia Preprocedure Evaluation (Signed)
Anesthesia Evaluation  Patient identified by MRN, date of birth, ID band Patient awake    Reviewed: Allergy & Precautions, NPO status , Patient's Chart, lab work & pertinent test results, reviewed documented beta blocker date and time   History of Anesthesia Complications Negative for: history of anesthetic complications  Airway Mallampati: II  TM Distance: >3 FB Neck ROM: Full    Dental  (+) Dental Advisory Given   Pulmonary sleep apnea ,  02/16/2021 SARS coronavirus neg   breath sounds clear to auscultation       Cardiovascular hypertension, Pt. on medications and Pt. on home beta blockers (-) angina+ dysrhythmias Atrial Fibrillation  Rhythm:Irregular Rate:Normal  '21 ECHO:EF 40-45%, Grade 1 DD, mild MR, mild AI   Neuro/Psych negative neurological ROS  negative psych ROS   GI/Hepatic Neg liver ROS, GERD  Medicated and Controlled,  Endo/Other  Morbid obesity  Renal/GU Renal InsufficiencyRenal disease  negative genitourinary   Musculoskeletal  (+) Arthritis ,   Abdominal (+) + obese,   Peds  Hematology eliquis   Anesthesia Other Findings   Reproductive/Obstetrics                             Anesthesia Physical  Anesthesia Plan  ASA: III  Anesthesia Plan: General   Post-op Pain Management:    Induction: Intravenous  PONV Risk Score and Plan: 3 and Treatment may vary due to age or medical condition and Propofol infusion  Airway Management Planned:   Additional Equipment:   Intra-op Plan:   Post-operative Plan:   Informed Consent: I have reviewed the patients History and Physical, chart, labs and discussed the procedure including the risks, benefits and alternatives for the proposed anesthesia with the patient or authorized representative who has indicated his/her understanding and acceptance.       Plan Discussed with: CRNA  Anesthesia Plan Comments:          Anesthesia Quick Evaluation

## 2021-11-17 NOTE — Anesthesia Postprocedure Evaluation (Signed)
Anesthesia Post Note  Patient: Kelli Webster  Procedure(s) Performed: CARDIOVERSION     Patient location during evaluation: Endoscopy Anesthesia Type: General Level of consciousness: awake Pain management: pain level controlled Vital Signs Assessment: post-procedure vital signs reviewed and stable Respiratory status: spontaneous breathing Cardiovascular status: stable Postop Assessment: no apparent nausea or vomiting Anesthetic complications: no   No notable events documented.  Last Vitals:  Vitals:   11/17/21 1223 11/17/21 1233  BP: (!) 129/53 (!) 116/48  Pulse: (!) 44 (!) 45  Resp: 18 14  Temp: 36.4 C   SpO2: 100% 92%    Last Pain:  Vitals:   11/17/21 1233  TempSrc:   PainSc: 0-No pain                 Caren Macadam

## 2021-11-17 NOTE — Discharge Instructions (Addendum)

## 2021-11-17 NOTE — Transfer of Care (Signed)
Immediate Anesthesia Transfer of Care Note  Patient: Kelli Webster  Procedure(s) Performed: CARDIOVERSION  Patient Location: Endoscopy Unit  Anesthesia Type:General  Level of Consciousness: drowsy  Airway & Oxygen Therapy: Patient Spontanous Breathing and Patient connected to nasal cannula oxygen  Post-op Assessment: Report given to RN and Post -op Vital signs reviewed and stable  Post vital signs: Reviewed and stable  Last Vitals:  Vitals Value Taken Time  BP    Temp    Pulse    Resp    SpO2      Last Pain:  Vitals:   11/17/21 1130  TempSrc: Temporal  PainSc: 0-No pain         Complications: No notable events documented.

## 2021-11-17 NOTE — Anesthesia Procedure Notes (Signed)
Procedure Name: General with mask airway Date/Time: 11/17/2021 12:16 PM Performed by: Elliot Dally, CRNA Pre-anesthesia Checklist: Patient identified, Emergency Drugs available, Suction available, Patient being monitored and Timeout performed Patient Re-evaluated:Patient Re-evaluated prior to induction Oxygen Delivery Method: Ambu bag Preoxygenation: Pre-oxygenation with 100% oxygen

## 2021-11-17 NOTE — Procedures (Signed)
Electrical Cardioversion Procedure Note Takoda Siedlecki [id number] [birth year]  Procedure: Electrical Cardioversion Indications:  Atrial Fibrillation  Procedure Details Consent: Risks of procedure as well as the alternatives and risks of each were explained to the (patient/caregiver).  Consent for procedure obtained. Time Out: Verified patient identification, verified procedure, site/side was marked, verified correct patient position, special equipment/implants available, medications/allergies/relevent history reviewed, required imaging and test results available.  Performed  Patient placed on cardiac monitor, pulse oximetry, supplemental oxygen as necessary.  Sedation given:  Propofol per anesthesiology Pacer pads placed anterior and posterior chest.  Cardioverted 1 time(s).  Cardioverted at 200J.  Evaluation Findings: Post procedure EKG shows: NSR, HR low in 40s Complications: None Patient did tolerate procedure well.   [name] 11/17/2021, 12:18 PM

## 2021-11-19 ENCOUNTER — Encounter (HOSPITAL_COMMUNITY): Payer: Self-pay | Admitting: Cardiology

## 2021-11-26 NOTE — Progress Notes (Signed)
Patient ID: Kelli Webster, female   DOB: 27-Jan-1946, 75 y.o.   MRN: [id number] PCP: Dr. Redmond Pulling EP: Dr Rayann Heman HF MD: Dr Aundra Dubin.   75 y.o. woman with h/o obesity, persistent atrial fibrillation/flutter, moderate OSA (cannot tolerate CPAP) and systolic HF.  She was admitted in 09/2013 with severe dyspnea for several days and was found to have atrial fibrillation with RVR and volume overload.  EF 20-25% on echo, diffuse hypokinesis.   She was cardioverted twice but both times went back into atrial fibrillation, the 2nd time on amiodarone.    She had an atrial fibrillation ablation in 12/14 that was successful.  TEE in 12/14 showed EF 30%.  Repeat echo in 4/15 showed EF up to 45%.  Echo in 6/17 showed EF down to 25-30%.  Echo in 6/18 showed EF back up to 45-50%.  In 3/20 she had recurrent atrial fibrillation and was cardioverted.  In 11/20, echo showed EF 40-45% with normal RV.   She was cardioverted again in 12/21 and 1/22.  In 3/22, she had redo atrial fibrillation ablation.  However, she remained in atypical atrial flutter and Dr. Rayann Heman stopped her amiodarone in 8/22.  She had COVID-19 in 8/22.   She was in atypical flutter at follow up 11/22. Started on amiodarone and underwent successful DCCV 11/22.  Echo 11/22 EF 35-40%  Today she returns for post DCCV HF follow up. She has mild dyspnea with increased activity, but also has sciatica and she is unsure how much of her dyspnea is due to this. Overall feeling better since cardioversion.  Denies palpitations, abnormal bleeding, CP, dizziness, edema, or PND/Orthopnea. Appetite ok. No fever or chills. Weight at home 242 pounds. Taking all medications. HR at home 60-70s now, resting 50-54.   ECG (personally reviewed): SR AVB PR 224 msec  Labs (4/15): K 5.2 creatinine 1.6, digoxin 1.0  Labs (11/15): K 5.1, creatinine 1.4 Labs 09/2015: K 4.6 Creatinine 1.53  Labs (8/17): K 4.8, creatinine 1.53, hgb 11.6 Labs (10/17): K 4.7, creatinine 1.37, BNP 74 Labs  (2/18): K 4.9, creatinine 1.47, hgb 11.5 Labs (7/18): hgb 11.6, K 4.5, creatinine 1.5 Labs (2/20): K 5, creatinine 1.6 Labs (2/22): K 5, creatinine 1.72 Labs (11/22): K 4.7, creatinine 1.97   PMH:  1. Obesity 2. GERD 3. OA 4. EGD negative in 10/14 5. ACEI cough 6. Atrial fibrillation: First diagnosed in 10/14.  DCCV to NSR in 09/2013 but atrial fibrillation recurred. Cardioversion 10/18/2013 successful on amiodarone but back in atrial fibrillation in 11/14.  Atrial fibrillation ablation ([facility]) 12/14.  - DCCV to NSR in 3/20, 12/21, 1/22 - Redo atrial fibrillation ablation in 3/22.   7. Cardiomyopathy: Echo (10/14) with EF 20-25%, diffuse HK.  LHC (10/14) showed no CAD.  TSH normal, HIV negative, SPEP/UPEP negative.  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  TEE (12/14) with EF 30%, diffuse hypokinesis, mild MR, mildly decreased RV systolic function.  Echo (4/15) with EF 45%, diffuse hypokinesis, mildly dilated LV, normal RV size and systolic function.  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 - Echo (6/17) with EF 25-30%, mild MR.  - Echo (6/18) with EF 45-50%, mildly dilated LV, mild MR.  - Echo (11/20) with EF 40-45%, normal RV.  8. Sinus bradycardia 9. OSA: Cannot tolerate CPAP. 10. CKD stage 3 11. Low back pain/sciatica.  46. COVID-19 8/22  SH: Married, lives in [place], nonsmoker.   FH: No history of cardiomyopathy or premature CAD.   ROS: All systems reviewed and negative except as per HPI.  Current Outpatient Medications  Medication Sig Dispense Refill   acetaminophen (TYLENOL) 325 MG tablet Take 325-650 mg by mouth every 6 (six) hours as needed for moderate pain or headache.     amiodarone (PACERONE) 200 MG tablet Take 1 tablet (200 mg total) by mouth daily. 90 tablet 6   carvedilol (COREG) 12.5 MG tablet Take 0.5 tablets (6.25 mg total) by mouth 2 (two) times daily with a meal. 180 tablet 3   dapagliflozin propanediol (FARXIGA) 10 MG TABS tablet Take 1 tablet (10 mg total)  by mouth daily before breakfast. 30 tablet 11   ELIQUIS 5 MG TABS tablet TAKE 1 TABLET BY MOUTH  TWICE DAILY 180 tablet 3   famotidine (PEPCID) 20 MG tablet Take 20 mg by mouth daily as needed for heartburn or indigestion.     losartan (COZAAR) 25 MG tablet Take 1 tablet (25 mg total) by mouth daily. 90 tablet 3   Multiple Vitamins-Minerals (MULTIVITAMIN WITH MINERALS) tablet Take 1 tablet by mouth daily. Centrum Multivitamin     No current facility-administered medications for this encounter.   BP 136/72   Pulse 69   Wt 115.1 kg   SpO2 98%   BMI 44.25 kg/m   Wt Readings from Last 3 Encounters:  11/30/21 115.1 kg  11/17/21 108.9 kg  10/19/21 114.7 kg   General:  NAD. No resp difficulty HEENT: Normal Neck: Supple. No JVD. Carotids 2+ bilat; no bruits. No lymphadenopathy or thryomegaly appreciated. Cor: PMI nondisplaced. Regular rate & rhythm. No rubs, gallops or murmurs. Lungs: Clear Abdomen: Obese, nontender, nondistended. No hepatosplenomegaly. No bruits or masses. Good bowel sounds. Extremities: No cyanosis, clubbing, rash, edema Neuro: Alert & oriented x 3, cranial nerves grossly intact. Moves all 4 extremities w/o difficulty. Affect pleasant.  Assessment/Plan: 1. 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 Echo 05/2016 EF 25-30%, Echo 9/20 with EF 40-45%, most recent Echo 11/22 showed EF 35-40%.  She is not volume overloaded on exam, NYHA class II symptoms.  - She was unable to tolerate Entresto due to lightheadedness.  - Continue carvedilol 6.25 mg bid. Notify clinic if HR<50. - Continue losartan 25 mg daily.  - Continue dapagliflozin 10 mg daily. - Off spiro with hyperkalemia. 2. Atrial fibrillation/flutter: She is now in persistent atypical atrial flutter after redo atrial fibrillation ablation in 3/22.  Amiodarone was stopped in 8/22. Back in atypical aflutter 11/22. Amiodarone restarted and underwent successful DCCV. - Continue  amiodarone 200 mg daily for now. Consider decreasing to 100 mg daily at next visit. Check LFTs and TSH today. She will need a regular eye exam. - Continue apixaban 5 mg bid. No bleeding issues. CBC today. - Notify clinic if she feels abnormal beats/palpitations. 3. OSA: She has been diagnosed in the past but cannot tolerate CPAP.  4. Obesity: Body mass index is 44.25 kg/m. - She asked about returning to exercise routine (elliptical). I think this would be beneficial for her overall health.  Follow up with Dr. Shirlee Latch in 3 months.   Anderson Malta [facility] FNP 11/30/2021

## 2021-11-30 ENCOUNTER — Other Ambulatory Visit: Payer: Self-pay

## 2021-11-30 ENCOUNTER — Encounter (HOSPITAL_COMMUNITY): Payer: Self-pay

## 2021-11-30 ENCOUNTER — Ambulatory Visit (HOSPITAL_COMMUNITY)
Admission: RE | Admit: 2021-11-30 | Discharge: 2021-11-30 | Disposition: A | Discharge status: Home / Self Care | Payer: Medicare Other | Source: Ambulatory Visit | Attending: Family Medicine | Admitting: Family Medicine

## 2021-11-30 ENCOUNTER — Other Ambulatory Visit (HOSPITAL_COMMUNITY): Payer: Self-pay | Admitting: Cardiology

## 2021-11-30 VITALS — BP 136/72 | HR 69 | Wt 253.8 lb

## 2021-11-30 DIAGNOSIS — Z79899 Other long term (current) drug therapy: Secondary | ICD-10-CM | POA: Diagnosis not present

## 2021-11-30 DIAGNOSIS — N183 Chronic kidney disease, stage 3 unspecified: Secondary | ICD-10-CM | POA: Insufficient documentation

## 2021-11-30 DIAGNOSIS — Z8616 Personal history of COVID-19: Secondary | ICD-10-CM | POA: Diagnosis not present

## 2021-11-30 DIAGNOSIS — R0600 Dyspnea, unspecified: Secondary | ICD-10-CM | POA: Insufficient documentation

## 2021-11-30 DIAGNOSIS — G4733 Obstructive sleep apnea (adult) (pediatric): Secondary | ICD-10-CM

## 2021-11-30 DIAGNOSIS — I484 Atypical atrial flutter: Secondary | ICD-10-CM | POA: Diagnosis not present

## 2021-11-30 DIAGNOSIS — E669 Obesity, unspecified: Secondary | ICD-10-CM | POA: Insufficient documentation

## 2021-11-30 DIAGNOSIS — I4891 Unspecified atrial fibrillation: Secondary | ICD-10-CM | POA: Insufficient documentation

## 2021-11-30 DIAGNOSIS — Z09 Encounter for follow-up examination after completed treatment for conditions other than malignant neoplasm: Secondary | ICD-10-CM | POA: Diagnosis not present

## 2021-11-30 DIAGNOSIS — M5430 Sciatica, unspecified side: Secondary | ICD-10-CM | POA: Diagnosis not present

## 2021-11-30 DIAGNOSIS — Z6841 Body Mass Index (BMI) 40.0 and over, adult: Secondary | ICD-10-CM

## 2021-11-30 DIAGNOSIS — I428 Other cardiomyopathies: Secondary | ICD-10-CM | POA: Insufficient documentation

## 2021-11-30 DIAGNOSIS — I5022 Chronic systolic (congestive) heart failure: Secondary | ICD-10-CM

## 2021-11-30 DIAGNOSIS — Z7901 Long term (current) use of anticoagulants: Secondary | ICD-10-CM | POA: Insufficient documentation

## 2021-11-30 LAB — COMPREHENSIVE METABOLIC PANEL
ALT: 19 U/L (ref 0–44)
AST: 25 U/L (ref 15–41)
Albumin: 3.2 g/dL — ABNORMAL LOW (ref 3.5–5.0)
Alkaline Phosphatase: 65 U/L (ref 38–126)
Anion gap: 9 (ref 5–15)
BUN: 18 mg/dL (ref 8–23)
CO2: 27 mmol/L (ref 22–32)
Calcium: 8.9 mg/dL (ref 8.9–10.3)
Chloride: 103 mmol/L (ref 98–111)
Creatinine, Ser: 1.74 mg/dL — ABNORMAL HIGH (ref 0.44–1.00)
GFR, Estimated: 30 mL/min — ABNORMAL LOW (ref >60)
Glucose, Bld: 156 mg/dL — ABNORMAL HIGH (ref 70–99)
Potassium: 4.3 mmol/L (ref 3.5–5.1)
Sodium: 139 mmol/L (ref 135–145)
Total Bilirubin: 0.8 mg/dL (ref 0.3–1.2)
Total Protein: 6.2 g/dL — ABNORMAL LOW (ref 6.5–8.1)

## 2021-11-30 LAB — CBC
HCT: 30.6 % — ABNORMAL LOW (ref 36.0–46.0)
Hemoglobin: 8.7 g/dL — ABNORMAL LOW (ref 12.0–15.0)
MCH: 23.3 pg — ABNORMAL LOW (ref 26.0–34.0)
MCHC: 28.4 g/dL — ABNORMAL LOW (ref 30.0–36.0)
MCV: 81.8 fL (ref 80.0–100.0)
Platelets: 524 10*3/uL — ABNORMAL HIGH (ref 150–400)
RBC: 3.74 MIL/uL — ABNORMAL LOW (ref 3.87–5.11)
RDW: 14.9 % (ref 11.5–15.5)
WBC: 7.2 10*3/uL (ref 4.0–10.5)
nRBC: 0 % (ref 0.0–0.2)

## 2021-11-30 LAB — TSH: TSH: 3.875 u[IU]/mL (ref 0.350–4.500)

## 2021-11-30 NOTE — Patient Instructions (Addendum)
Medication Changes:  None  Lab Work:  Labs today We will only contact you if something comes back abnormal or we need to make some changes. Otherwise no news is good news!   Testing/Procedures:  None  Referrals:  None  Special Instructions // Education:  Please notify our clinic if your heart rate is less than 50 or if you feel abnormal beats.  Follow-Up in: 3 months with Dr. Shirlee Latch  At the [facility], you and your health needs are our priority. We have a designated team specialized in the treatment of Heart Failure. This Care Team includes your primary Heart Failure Specialized Cardiologist (physician), Advanced Practice Providers (APPs- Physician Assistants and Nurse Practitioners), and Pharmacist who all work together to provide you with the care you need, when you need it.   You may see any of the following providers on your designated Care Team at your next follow up:  Dr Arvilla Meres Dr Carron Curie, NP Robbie Lis, [place] [facility] [place], [place] Karle Plumber, PharmD   Please be sure to bring in all your medications bottles to every appointment.   Need to Contact us:  If you have any questions or concerns before your next appointment please send [place] a message through [place] or call our office at [phone].    TO LEAVE A MESSAGE FOR THE NURSE SELECT OPTION 2, PLEASE LEAVE A MESSAGE INCLUDING: YOUR NAME DATE OF BIRTH CALL BACK NUMBER REASON FOR CALL**this is important as we prioritize the call backs  YOU WILL RECEIVE A CALL BACK THE SAME DAY AS LONG AS YOU CALL BEFORE 4:00 PM

## 2021-12-08 ENCOUNTER — Other Ambulatory Visit (HOSPITAL_COMMUNITY): Payer: Medicare Other

## 2021-12-10 ENCOUNTER — Other Ambulatory Visit (HOSPITAL_COMMUNITY): Payer: Self-pay | Admitting: Cardiology

## 2021-12-15 ENCOUNTER — Other Ambulatory Visit: Payer: Self-pay

## 2021-12-15 ENCOUNTER — Ambulatory Visit (HOSPITAL_COMMUNITY)
Admission: RE | Admit: 2021-12-15 | Discharge: 2021-12-15 | Disposition: A | Discharge status: Home / Self Care | Payer: Medicare Other | Source: Ambulatory Visit | Attending: Cardiology | Admitting: Cardiology

## 2021-12-15 DIAGNOSIS — I5022 Chronic systolic (congestive) heart failure: Secondary | ICD-10-CM | POA: Insufficient documentation

## 2021-12-15 LAB — IRON AND TIBC
Iron: 30 ug/dL (ref 28–170)
Saturation Ratios: 6 % — ABNORMAL LOW (ref 10.4–31.8)
TIBC: 519 ug/dL — ABNORMAL HIGH (ref 250–450)
UIBC: 489 ug/dL

## 2021-12-15 LAB — CBC
HCT: 31 % — ABNORMAL LOW (ref 36.0–46.0)
Hemoglobin: 8.5 g/dL — ABNORMAL LOW (ref 12.0–15.0)
MCH: 22.1 pg — ABNORMAL LOW (ref 26.0–34.0)
MCHC: 27.4 g/dL — ABNORMAL LOW (ref 30.0–36.0)
MCV: 80.7 fL (ref 80.0–100.0)
Platelets: 533 10*3/uL — ABNORMAL HIGH (ref 150–400)
RBC: 3.84 MIL/uL — ABNORMAL LOW (ref 3.87–5.11)
RDW: 15.6 % — ABNORMAL HIGH (ref 11.5–15.5)
WBC: 9.8 10*3/uL (ref 4.0–10.5)
nRBC: 0 % (ref 0.0–0.2)

## 2021-12-15 LAB — FERRITIN: Ferritin: 5 ng/mL — ABNORMAL LOW (ref 11–307)

## 2021-12-16 ENCOUNTER — Telehealth (HOSPITAL_COMMUNITY): Payer: Self-pay | Admitting: Surgery

## 2021-12-16 NOTE — Telephone Encounter (Signed)
-----   Message from Jacklynn Ganong, [place] sent at 12/16/2021  8:08 AM EST ----- Iron studies show iron deficiency anemia. Please follow up with PCP asap regarding treatments (IV iron infusion vs oral replacement). I will send labs to patient's PCP as well.

## 2021-12-16 NOTE — Telephone Encounter (Signed)
I called patient and reviewed results.  She plans to reach out to PCP for further evaluation.

## 2022-02-07 ENCOUNTER — Telehealth (HOSPITAL_COMMUNITY): Payer: Self-pay

## 2022-02-07 NOTE — Telephone Encounter (Signed)
She should still be on losartan 25 mg daily.  Continue Marcelline Deist but give prescription for fluconazole, get dosing from Lauren.

## 2022-02-07 NOTE — Telephone Encounter (Signed)
Patient called stating that she has developed a yeast infection since being on the Farxiga and wants to know if you can send in a abx for her. Also she wants to know if she should continue the [place] or restart losartan and spironolactone. Please advise

## 2022-02-09 ENCOUNTER — Other Ambulatory Visit (HOSPITAL_COMMUNITY): Payer: Self-pay | Admitting: *Deleted

## 2022-02-09 ENCOUNTER — Other Ambulatory Visit (HOSPITAL_COMMUNITY): Payer: Self-pay

## 2022-02-09 ENCOUNTER — Telehealth (HOSPITAL_COMMUNITY): Payer: Self-pay | Admitting: Pharmacy Technician

## 2022-02-09 MED ORDER — FLUCONAZOLE 150 MG PO TABS: 150.0000 mg | ORAL_TABLET | Freq: Once | ORAL | 0 refills | Status: AC

## 2022-02-09 NOTE — Telephone Encounter (Signed)
Advanced Heart Failure Patient Advocate Encounter  Spoke with the patient regarding Faxiga copay. Current 30 day copay is $47. The patient was approved for a PAN HF grant that will help cover the cost of the medication. Sent patient a copy of the grant information.  Member ID: [id number] Group ID: [id number] RxBin ID: [id number] PCN: [id number] Eligibility Start Date: 11/11/2021 Eligibility End Date: 02/08/2023 Assistance Amount: $1,200.00  Archer Asa, CPhT

## 2022-02-09 NOTE — Telephone Encounter (Signed)
Pt called stating she did not receive a return call after speaking with someone at our office on Monday 2/20. I apologized to the patient and let her know I was out of the office on Monday but would be happy to assist her now. I saw a phone note where she spoke with Philicia Branch ,CMA on Monday 2/20 and Philicia sent a message to Dr.McLean and Dr.McLean replied on 2/20 with advice (see below). I relayed the message pt. She is agreeable with plan and rx for fluconazole sent to pharmacy.     Laurey Morale, MD  Physician Heart Failure Telephone Encounter Signed Creation Time:  02/07/2022  3:36 PM   Signed      She should still be on losartan 25 mg daily.  Continue Marcelline Deist but give prescription for fluconazole, get dosing from Lauren.          Tag Copy   Branch, Philicia R, CMA  Certified Medical Assistant Telephone Encounter Signed Creation Time:  02/07/2022  2:09 PM   Signed      Patient called stating that she has developed a yeast infection since being on the Farxiga and wants to know if you can send in a abx for her. Also she wants to know if she should continue the [place] or restart losartan and spironolactone. Please advise

## 2022-02-16 ENCOUNTER — Other Ambulatory Visit (HOSPITAL_COMMUNITY): Payer: Self-pay | Admitting: Cardiology

## 2022-02-18 ENCOUNTER — Other Ambulatory Visit (HOSPITAL_COMMUNITY): Payer: Self-pay | Admitting: Cardiology

## 2022-03-04 ENCOUNTER — Encounter (HOSPITAL_COMMUNITY): Payer: Medicare Other | Admitting: Cardiology

## 2022-04-04 ENCOUNTER — Encounter (HOSPITAL_COMMUNITY): Payer: Medicare Other | Admitting: Cardiology

## 2022-05-20 ENCOUNTER — Ambulatory Visit (HOSPITAL_COMMUNITY)
Admission: RE | Admit: 2022-05-20 | Discharge: 2022-05-20 | Disposition: A | Discharge status: Home / Self Care | Payer: Medicare Other | Source: Ambulatory Visit | Attending: Cardiology | Admitting: Cardiology

## 2022-05-20 ENCOUNTER — Encounter (HOSPITAL_COMMUNITY): Payer: Self-pay | Admitting: Cardiology

## 2022-05-20 VITALS — BP 110/70 | HR 59 | Ht 63.5 in | Wt 252.4 lb

## 2022-05-20 DIAGNOSIS — I480 Paroxysmal atrial fibrillation: Secondary | ICD-10-CM

## 2022-05-20 DIAGNOSIS — M549 Dorsalgia, unspecified: Secondary | ICD-10-CM | POA: Insufficient documentation

## 2022-05-20 DIAGNOSIS — Z7983 Long term (current) use of bisphosphonates: Secondary | ICD-10-CM | POA: Insufficient documentation

## 2022-05-20 DIAGNOSIS — I484 Atypical atrial flutter: Secondary | ICD-10-CM | POA: Insufficient documentation

## 2022-05-20 DIAGNOSIS — Z79899 Other long term (current) drug therapy: Secondary | ICD-10-CM | POA: Diagnosis not present

## 2022-05-20 DIAGNOSIS — I4819 Other persistent atrial fibrillation: Secondary | ICD-10-CM | POA: Insufficient documentation

## 2022-05-20 DIAGNOSIS — G4733 Obstructive sleep apnea (adult) (pediatric): Secondary | ICD-10-CM | POA: Diagnosis not present

## 2022-05-20 DIAGNOSIS — Z7901 Long term (current) use of anticoagulants: Secondary | ICD-10-CM | POA: Diagnosis not present

## 2022-05-20 DIAGNOSIS — E669 Obesity, unspecified: Secondary | ICD-10-CM | POA: Insufficient documentation

## 2022-05-20 DIAGNOSIS — E875 Hyperkalemia: Secondary | ICD-10-CM | POA: Insufficient documentation

## 2022-05-20 DIAGNOSIS — I428 Other cardiomyopathies: Secondary | ICD-10-CM | POA: Insufficient documentation

## 2022-05-20 DIAGNOSIS — Z6841 Body Mass Index (BMI) 40.0 and over, adult: Secondary | ICD-10-CM | POA: Insufficient documentation

## 2022-05-20 DIAGNOSIS — I5022 Chronic systolic (congestive) heart failure: Secondary | ICD-10-CM | POA: Diagnosis not present

## 2022-05-20 DIAGNOSIS — Z7902 Long term (current) use of antithrombotics/antiplatelets: Secondary | ICD-10-CM | POA: Insufficient documentation

## 2022-05-20 LAB — COMPREHENSIVE METABOLIC PANEL
ALT: 15 U/L (ref 0–44)
AST: 22 U/L (ref 15–41)
Albumin: 3.4 g/dL — ABNORMAL LOW (ref 3.5–5.0)
Alkaline Phosphatase: 55 U/L (ref 38–126)
Anion gap: 7 (ref 5–15)
BUN: 22 mg/dL (ref 8–23)
CO2: 28 mmol/L (ref 22–32)
Calcium: 9.1 mg/dL (ref 8.9–10.3)
Chloride: 104 mmol/L (ref 98–111)
Creatinine, Ser: 1.65 mg/dL — ABNORMAL HIGH (ref 0.44–1.00)
GFR, Estimated: 32 mL/min — ABNORMAL LOW (ref >60)
Glucose, Bld: 143 mg/dL — ABNORMAL HIGH (ref 70–99)
Potassium: 4.7 mmol/L (ref 3.5–5.1)
Sodium: 139 mmol/L (ref 135–145)
Total Bilirubin: 0.7 mg/dL (ref 0.3–1.2)
Total Protein: 6.4 g/dL — ABNORMAL LOW (ref 6.5–8.1)

## 2022-05-20 LAB — CBC
HCT: 38.1 % (ref 36.0–46.0)
Hemoglobin: 11.2 g/dL — ABNORMAL LOW (ref 12.0–15.0)
MCH: 25.7 pg — ABNORMAL LOW (ref 26.0–34.0)
MCHC: 29.4 g/dL — ABNORMAL LOW (ref 30.0–36.0)
MCV: 87.6 fL (ref 80.0–100.0)
Platelets: 385 10*3/uL (ref 150–400)
RBC: 4.35 MIL/uL (ref 3.87–5.11)
RDW: 18.1 % — ABNORMAL HIGH (ref 11.5–15.5)
WBC: 5.7 10*3/uL (ref 4.0–10.5)
nRBC: 0 % (ref 0.0–0.2)

## 2022-05-20 LAB — TSH: TSH: 4.071 u[IU]/mL (ref 0.350–4.500)

## 2022-05-20 MED ORDER — AMIODARONE HCL 200 MG PO TABS: 100.0000 mg | ORAL_TABLET | Freq: Every day | ORAL | Status: DC

## 2022-05-20 NOTE — Progress Notes (Signed)
Patient ID: Kelli Webster, female   DOB: 10-Aug-1946, 76 y.o.   MRN: [id number] PCP: Dr. Redmond Pulling.  EP: Dr Rayann Heman HF MD: Dr Aundra Dubin.   76 y.o. woman with h/o obesity, PAF, moderate OSA (cannot tolerate CPAP) and systolic HF.  She was admitted in 09/2013 with severe dyspnea for several days and was found to have atrial fibrillation with RVR and volume overload.  EF 20-25% on echo, diffuse hypokinesis.   She was cardioverted twice but both times went back into atrial fibrillation, the 2nd time on amiodarone.    She had an atrial fibrillation ablation in 12/14 that was successful.  TEE in 12/14 showed EF 30%.  Repeat echo in 4/15 showed EF up to 45%.  Echo in 6/17 showed EF down to 25-30%.  Echo in 6/18 showed EF back up to 45-50%.  In 3/20 she had recurrent atrial fibrillation and was cardioverted.  In 11/20, echo showed EF 40-45% with normal RV.   She was cardioverted again in 12/21 and 1/22.  In 3/22, she had redo atrial fibrillation ablation.  However, she remained in atypical atrial flutter and Dr. Rayann Heman stopped her amiodarone in 8/22.  She had COVID-19 in 8/22.   She was in atypical flutter at follow up 11/22. Started on amiodarone and underwent successful DCCV 11/22.  Today she returns for HF follow up.Overall feeling fine. Limited by back pain. Denies SOB/PND/Orthopnea. No bleeding issues. Appetite ok. No fever or chills. Weight at home has stable.  Taking all medications.  Echo 11/22 EF 35-40%  Labs (4/15): K 5.2 creatinine 1.6, digoxin 1.0  Labs (11/15): K 5.1, creatinine 1.4 Labs 09/2015: K 4.6 Creatinine 1.53  Labs (8/17): K 4.8, creatinine 1.53, hgb 11.6 Labs (10/17): K 4.7, creatinine 1.37, BNP 74 Labs (2/18): K 4.9, creatinine 1.47, hgb 11.5 Labs (7/18): hgb 11.6, K 4.5, creatinine 1.5 Labs (2/20): K 5, creatinine 1.6 Labs (2/22): K 5, creatinine 1.72 Labs (11/22): K 4.7, creatinine 1.97 Labs (02/2022): K 5.1 Creatinine 1.68   PMH:  1. Obesity 2. GERD 3. OA 4. EGD negative in  10/14 5. ACEI cough 6. Atrial fibrillation: First diagnosed in 10/14.  DCCV to NSR in 09/2013 but atrial fibrillation recurred. Cardioversion 10/18/2013 successful on amiodarone but back in atrial fibrillation in 11/14.  Atrial fibrillation ablation ([facility]) 12/14.  - DCCV to NSR in 3/20, 12/21, 1/22 - Redo atrial fibrillation ablation in 3/22.   7. Cardiomyopathy: Echo (10/14) with EF 20-25%, diffuse HK.  LHC (10/14) showed no CAD.  TSH normal, HIV negative, SPEP/UPEP negative.  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  TEE (12/14) with EF 30%, diffuse hypokinesis, mild MR, mildly decreased RV systolic function.  Echo (4/15) with EF 45%, diffuse hypokinesis, mildly dilated LV, normal RV size and systolic function.  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 - Echo (6/17) with EF 25-30%, mild MR.  - Echo (6/18) with EF 45-50%, mildly dilated LV, mild MR.  - Echo (11/20) with EF 40-45%, normal RV.  8. Sinus bradycardia 9. OSA: Cannot tolerate CPAP. 10. CKD stage 3 11. Low back pain/sciatica.  38. COVID-19 8/22  SH: Married, lives in [place], nonsmoker.   FH: No history of cardiomyopathy or premature CAD.   ROS: All systems reviewed and negative except as per HPI.   Current Outpatient Medications  Medication Sig Dispense Refill   acetaminophen (TYLENOL) 325 MG tablet Take 325-650 mg by mouth every 6 (six) hours as needed for moderate pain or headache.     amiodarone (PACERONE) 200 MG tablet  Take 1 tablet (200 mg total) by mouth daily. 90 tablet 6   BIOTIN PO Take 280 mcg by mouth daily.     carvedilol (COREG) 12.5 MG tablet Take 0.5 tablets (6.25 mg total) by mouth 2 (two) times daily with a meal. 180 tablet 3   dapagliflozin propanediol (FARXIGA) 10 MG TABS tablet Take 1 tablet (10 mg total) by mouth daily before breakfast. 30 tablet 11   ELIQUIS 5 MG TABS tablet TAKE 1 TABLET BY MOUTH  TWICE DAILY 180 tablet 3   famotidine (PEPCID) 20 MG tablet Take 20 mg by mouth daily as needed for  heartburn or indigestion.     losartan (COZAAR) 25 MG tablet Take 1 tablet (25 mg total) by mouth daily. 90 tablet 3   Multiple Vitamins-Minerals (MULTIVITAMIN WITH MINERALS) tablet Take 1 tablet by mouth daily. Centrum Multivitamin     No current facility-administered medications for this encounter.   BP 110/70   Pulse (!) 59   Ht 5' 3.5" (1.613 m)   Wt 114.5 kg (252 lb 6.4 oz)   SpO2 99%   BMI 44.01 kg/m   Wt Readings from Last 3 Encounters:  05/20/22 114.5 kg (252 lb 6.4 oz)  11/30/21 115.1 kg (253 lb 12.8 oz)  11/17/21 108.9 kg (240 lb)  General:  Well appearing. No resp difficulty HEENT: normal Neck: supple. no JVD. Carotids 2+ bilat; no bruits. No lymphadenopathy or thryomegaly appreciated. Cor: PMI nondisplaced. Regular rate & rhythm. No rubs, gallops or murmurs. Lungs: clear Abdomen: soft, nontender, nondistended. No hepatosplenomegaly. No bruits or masses. Good bowel sounds. Extremities: no cyanosis, clubbing, rash, edema Neuro: alert & orientedx3, cranial nerves grossly intact. moves all 4 extremities w/o difficulty. Affect pleasant  EKG: SR 61 bpm personally checked   Assessment/Plan: 1. 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 Echo 05/2016 EF 25-30%, Echo 9/20 with EF 40-45%, most recent Echo 11/22 showed EF 35-40%.   - NYHA II. Volume status stable  - She was unable to tolerate Entresto due to lightheadedness.  - Continue carvedilol 6.25 mg bid.  - Continue losartan 25 mg daily.  - Continue dapagliflozin 10 mg daily. - Off spiro with hyperkalemia. - Check BMET  2. Atrial fibrillation/flutter: She is now in persistent atypical atrial flutter after redo atrial fibrillation ablation in 3/22.  Amiodarone was stopped in 8/22. Back in atypical aflutter 11/22. Amiodarone restarted and underwent successful DCCV.Maintaining SR.  - Cut back amio to 100 mg daily.   - Continue apixaban 5 mg bid. No bleeding issues.  - Check CBC ,  TSH, LFTs  - Discussed yearly eye exam.  3. OSA: She has been diagnosed in the past but cannot tolerate CPAP.  4. Obesity: Body mass index is 44.01 kg/m. Discussed portion control.    Check CMET, TSH, CBC today   Follow up in 3-4 months.  Tykira Wachs NP-C  05/20/2022

## 2022-05-20 NOTE — Patient Instructions (Signed)
Decrease Amiodarone to 100mg  (1/2 Tab) daily.  Labs done today, your results will be available in MyChart, we will contact you for abnormal readings.  Your physician recommends that you schedule a follow-up appointment in: 3-4 months.  If you have any questions or concerns before your next appointment please send [place] a message through [place] or call our office at [phone].    TO LEAVE A MESSAGE FOR THE NURSE SELECT OPTION 2, PLEASE LEAVE A MESSAGE INCLUDING: YOUR NAME DATE OF BIRTH CALL BACK NUMBER REASON FOR CALL**this is important as we prioritize the call backs  YOU WILL RECEIVE A CALL BACK THE SAME DAY AS LONG AS YOU CALL BEFORE 4:00 PM  At the [facility], you and your health needs are our priority. As part of our continuing mission to provide you with exceptional heart care, we have created designated Provider Care Teams. These Care Teams include your primary Cardiologist (physician) and Advanced Practice Providers (APPs- Physician Assistants and Nurse Practitioners) who all work together to provide you with the care you need, when you need it.   You may see any of the following providers on your designated Care Team at your next follow up: Dr Glori Bickers Dr Haynes Kerns, NP Lyda Jester, [place] [facility] [place], [place] Audry Riles, PharmD   Please be sure to bring in all your medications bottles to every appointment.

## 2022-06-04 ENCOUNTER — Other Ambulatory Visit (HOSPITAL_COMMUNITY): Payer: Self-pay | Admitting: Cardiology

## 2022-06-13 ENCOUNTER — Inpatient Hospital Stay (HOSPITAL_COMMUNITY): Admission: RE | Admit: 2022-06-13 | Payer: Medicare Other | Source: Ambulatory Visit | Admitting: Cardiology

## 2022-07-31 ENCOUNTER — Other Ambulatory Visit (HOSPITAL_COMMUNITY): Payer: Self-pay | Admitting: Cardiology

## 2022-08-29 ENCOUNTER — Ambulatory Visit (HOSPITAL_COMMUNITY)
Admission: RE | Admit: 2022-08-29 | Discharge: 2022-08-29 | Disposition: A | Discharge status: Home / Self Care | Payer: Medicare Other | Source: Ambulatory Visit | Attending: Cardiology | Admitting: Cardiology

## 2022-08-29 ENCOUNTER — Encounter (HOSPITAL_COMMUNITY): Payer: Self-pay | Admitting: Cardiology

## 2022-08-29 VITALS — BP 112/70 | HR 65 | Wt 251.2 lb

## 2022-08-29 DIAGNOSIS — I484 Atypical atrial flutter: Secondary | ICD-10-CM | POA: Diagnosis not present

## 2022-08-29 DIAGNOSIS — E875 Hyperkalemia: Secondary | ICD-10-CM | POA: Diagnosis not present

## 2022-08-29 DIAGNOSIS — E119 Type 2 diabetes mellitus without complications: Secondary | ICD-10-CM | POA: Insufficient documentation

## 2022-08-29 DIAGNOSIS — I4891 Unspecified atrial fibrillation: Secondary | ICD-10-CM | POA: Diagnosis not present

## 2022-08-29 DIAGNOSIS — Z7984 Long term (current) use of oral hypoglycemic drugs: Secondary | ICD-10-CM | POA: Diagnosis not present

## 2022-08-29 DIAGNOSIS — G4733 Obstructive sleep apnea (adult) (pediatric): Secondary | ICD-10-CM | POA: Diagnosis not present

## 2022-08-29 DIAGNOSIS — R9431 Abnormal electrocardiogram [ECG] [EKG]: Secondary | ICD-10-CM | POA: Diagnosis not present

## 2022-08-29 DIAGNOSIS — Z8616 Personal history of COVID-19: Secondary | ICD-10-CM | POA: Insufficient documentation

## 2022-08-29 DIAGNOSIS — I440 Atrioventricular block, first degree: Secondary | ICD-10-CM | POA: Diagnosis not present

## 2022-08-29 DIAGNOSIS — Z79899 Other long term (current) drug therapy: Secondary | ICD-10-CM | POA: Insufficient documentation

## 2022-08-29 DIAGNOSIS — I428 Other cardiomyopathies: Secondary | ICD-10-CM | POA: Diagnosis not present

## 2022-08-29 DIAGNOSIS — I5022 Chronic systolic (congestive) heart failure: Secondary | ICD-10-CM | POA: Diagnosis present

## 2022-08-29 DIAGNOSIS — K219 Gastro-esophageal reflux disease without esophagitis: Secondary | ICD-10-CM | POA: Insufficient documentation

## 2022-08-29 DIAGNOSIS — E669 Obesity, unspecified: Secondary | ICD-10-CM | POA: Insufficient documentation

## 2022-08-29 DIAGNOSIS — Z6841 Body Mass Index (BMI) 40.0 and over, adult: Secondary | ICD-10-CM | POA: Diagnosis not present

## 2022-08-29 DIAGNOSIS — Z7901 Long term (current) use of anticoagulants: Secondary | ICD-10-CM | POA: Diagnosis not present

## 2022-08-29 DIAGNOSIS — N183 Chronic kidney disease, stage 3 unspecified: Secondary | ICD-10-CM | POA: Diagnosis not present

## 2022-08-29 LAB — COMPREHENSIVE METABOLIC PANEL
ALT: 15 U/L (ref 0–44)
AST: 22 U/L (ref 15–41)
Albumin: 3.5 g/dL (ref 3.5–5.0)
Alkaline Phosphatase: 58 U/L (ref 38–126)
Anion gap: 8 (ref 5–15)
BUN: 21 mg/dL (ref 8–23)
CO2: 26 mmol/L (ref 22–32)
Calcium: 9 mg/dL (ref 8.9–10.3)
Chloride: 107 mmol/L (ref 98–111)
Creatinine, Ser: 1.7 mg/dL — ABNORMAL HIGH (ref 0.44–1.00)
GFR, Estimated: 31 mL/min — ABNORMAL LOW (ref >60)
Glucose, Bld: 134 mg/dL — ABNORMAL HIGH (ref 70–99)
Potassium: 5 mmol/L (ref 3.5–5.1)
Sodium: 141 mmol/L (ref 135–145)
Total Bilirubin: 0.9 mg/dL (ref 0.3–1.2)
Total Protein: 6.6 g/dL (ref 6.5–8.1)

## 2022-08-29 LAB — HEMOGLOBIN A1C
Hgb A1c MFr Bld: 5.2 % (ref 4.8–5.6)
Mean Plasma Glucose: 102.54 mg/dL

## 2022-08-29 LAB — IRON AND TIBC
Iron: 64 ug/dL (ref 28–170)
Saturation Ratios: 15 % (ref 10.4–31.8)
TIBC: 442 ug/dL (ref 250–450)
UIBC: 378 ug/dL

## 2022-08-29 LAB — TSH: TSH: 3.839 u[IU]/mL (ref 0.350–4.500)

## 2022-08-29 LAB — BRAIN NATRIURETIC PEPTIDE: B Natriuretic Peptide: 263.8 pg/mL — ABNORMAL HIGH (ref 0.0–100.0)

## 2022-08-29 LAB — FERRITIN: Ferritin: 18 ng/mL (ref 11–307)

## 2022-08-29 MED ORDER — LOSARTAN POTASSIUM 25 MG PO TABS: 25.0000 mg | ORAL_TABLET | Freq: Two times a day (BID) | ORAL | 3 refills | Status: DC

## 2022-08-29 NOTE — Patient Instructions (Signed)
INCREASE losartan to 25 mg Twice daily  Lab work done today we will call you with any abnormal results  Lab work follow up in 10 days  Your physician has requested that you have an echocardiogram. Echocardiography is a painless test that uses sound waves to create images of your heart. It provides your doctor with information about the size and shape of your heart and how well your heart's chambers and valves are working. This procedure takes approximately one hour. There are no restrictions for this procedure.  Your physician recommends that you schedule a follow-up appointment in: 3 months with echocardiogram  If you have any questions or concerns before your next appointment please send [place] a message through [place] or call our office at [phone].    TO LEAVE A MESSAGE FOR THE NURSE SELECT OPTION 2, PLEASE LEAVE A MESSAGE INCLUDING: YOUR NAME DATE OF BIRTH CALL BACK NUMBER REASON FOR CALL**this is important as we prioritize the call backs  YOU WILL RECEIVE A CALL BACK THE SAME DAY AS LONG AS YOU CALL BEFORE 4:00 PM At the [facility], you and your health needs are our priority. As part of our continuing mission to provide you with exceptional heart care, we have created designated Provider Care Teams. These Care Teams include your primary Cardiologist (physician) and Advanced Practice Providers (APPs- Physician Assistants and Nurse Practitioners) who all work together to provide you with the care you need, when you need it.   You may see any of the following providers on your designated Care Team at your next follow up: Dr Arvilla Meres Dr [name] Dr. Marcos Eke, NP Robbie Lis, [place] [facility] [place], [place] Brynda Peon, NP Karle Plumber, PharmD   Please be sure to bring in all your medications bottles to every appointment.

## 2022-08-30 NOTE — Progress Notes (Signed)
- Patient ID: Kelli Webster, female   DOB: Feb 03, 1946, 76 y.o.   MRN: [id number] PCP: Dr. Andrey Campanile.  EP: Dr Johney Frame HF MD: Dr Shirlee Latch.   76 y.o. woman with h/o obesity, PAF, moderate OSA (cannot tolerate CPAP) and systolic HF.  She was admitted in 09/2013 with severe dyspnea for several days and was found to have atrial fibrillation with RVR and volume overload.  EF 20-25% on echo, diffuse hypokinesis.   She was cardioverted twice but both times went back into atrial fibrillation, the 2nd time on amiodarone.    She had an atrial fibrillation ablation in 12/14 that was successful.  TEE in 12/14 showed EF 30%.  Repeat echo in 4/15 showed EF up to 45%.  Echo in 6/17 showed EF down to 25-30%.  Echo in 6/18 showed EF back up to 45-50%.  In 3/20 she had recurrent atrial fibrillation and was cardioverted.  In 11/20, echo showed EF 40-45% with normal RV.   She was cardioverted again in 12/21 and 1/22.  In 3/22, she had redo atrial fibrillation ablation.  However, she remained in atypical atrial flutter and Dr. Johney Frame stopped her amiodarone in 8/22.  She had COVID-19 in 8/22.   She was in atypical flutter at follow up 11/22. Started on amiodarone and underwent successful DCCV 11/22.  Today she returns for HF follow up.  No complaints today.  Using elliptical at gym.  No significant exertional dyspnea.  No chest pain.  No orthopnea/PND. No lightheadedness.  Weight down 1 lb. HR generally runs in 50s.   Labs (4/15): K 5.2 creatinine 1.6, digoxin 1.0  Labs (11/15): K 5.1, creatinine 1.4 Labs 09/2015: K 4.6 Creatinine 1.53  Labs (8/17): K 4.8, creatinine 1.53, hgb 11.6 Labs (10/17): K 4.7, creatinine 1.37, BNP 74 Labs (2/18): K 4.9, creatinine 1.47, hgb 11.5 Labs (7/18): hgb 11.6, K 4.5, creatinine 1.5 Labs (2/20): K 5, creatinine 1.6 Labs (2/22): K 5, creatinine 1.72 Labs (11/22): K 4.7, creatinine 1.97 Labs (02/2022): K 5.1 Creatinine 1.68 Labs (6/23): K 4.7, creatinine 1.65, LFTs normal, hgb 11.2, TSH  normal  ECG (personally reviewed): NSR, 1st degree AVB, LAFB, IVCD 140 msec   PMH:  1. Obesity 2. GERD 3. OA 4. EGD negative in 10/14 5. ACEI cough 6. Atrial fibrillation: First diagnosed in 10/14.  DCCV to NSR in 09/2013 but atrial fibrillation recurred. Cardioversion 10/18/2013 successful on amiodarone but back in atrial fibrillation in 11/14.  Atrial fibrillation ablation ([facility]) 12/14.  - DCCV to NSR in 3/20, 12/21, 1/22 - Redo atrial fibrillation ablation in 3/22.   7. Cardiomyopathy: Echo (10/14) with EF 20-25%, diffuse HK.  LHC (10/14) showed no CAD.  TSH normal, HIV negative, SPEP/UPEP negative.  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  TEE (12/14) with EF 30%, diffuse hypokinesis, mild MR, mildly decreased RV systolic function.  Echo (4/15) with EF 45%, diffuse hypokinesis, mildly dilated LV, normal RV size and systolic function.  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 - Echo (6/17) with EF 25-30%, mild MR.  - Echo (6/18) with EF 45-50%, mildly dilated LV, mild MR.  - Echo (11/20) with EF 40-45%, normal RV.  - Echo (11/22): EF 35-40%, RV normal, mild MR.  8. Sinus bradycardia 9. OSA: Cannot tolerate CPAP. 10. CKD stage 3 11. Low back pain/sciatica.  12. COVID-19 8/22 13. Fe deficiency anemia  SH: Married, lives in [place], nonsmoker.   FH: No history of cardiomyopathy or premature CAD.   ROS: All systems reviewed and negative except as per HPI.  Current Outpatient Medications  Medication Sig Dispense Refill   acetaminophen (TYLENOL) 325 MG tablet Take 325-650 mg by mouth every 6 (six) hours as needed for moderate pain or headache.     amiodarone (PACERONE) 200 MG tablet Take 0.5 tablets (100 mg total) by mouth daily.     carvedilol (COREG) 12.5 MG tablet Take 6.25 mg by mouth 2 (two) times daily with a meal.     dapagliflozin propanediol (FARXIGA) 10 MG TABS tablet Take 1 tablet (10 mg total) by mouth daily before breakfast. 30 tablet 11   ELIQUIS 5 MG TABS tablet TAKE  1 TABLET BY MOUTH  TWICE DAILY 180 tablet 3   famotidine (PEPCID) 20 MG tablet Take 20 mg by mouth daily as needed for heartburn or indigestion.     Multiple Vitamins-Minerals (MULTIVITAMIN WITH MINERALS) tablet Take 1 tablet by mouth daily. Centrum Multivitamin     losartan (COZAAR) 25 MG tablet Take 1 tablet (25 mg total) by mouth 2 (two) times daily. 90 tablet 3   No current facility-administered medications for this encounter.   BP 112/70   Pulse 65   Wt 113.9 kg (251 lb 3.2 oz)   SpO2 98%   BMI 43.80 kg/m   Wt Readings from Last 3 Encounters:  08/29/22 113.9 kg (251 lb 3.2 oz)  05/20/22 114.5 kg (252 lb 6.4 oz)  11/30/21 115.1 kg (253 lb 12.8 oz)  General: NAD Neck: No JVD, no thyromegaly or thyroid nodule.  Lungs: Clear to auscultation bilaterally with normal respiratory effort. CV: Nondisplaced PMI.  Heart regular S1/S2, no S3/S4, no murmur.  No peripheral edema.  No carotid bruit.  Normal pedal pulses.  Abdomen: Soft, nontender, no hepatosplenomegaly, no distention.  Skin: Intact without lesions or rashes.  Neurologic: Alert and oriented x 3.  Psych: Normal affect. Extremities: No clubbing or cyanosis.  HEENT: Normal.   Assessment/Plan: 1. 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 Echo 05/2016 EF 25-30%, Echo 9/20 with EF 40-45%, most recent Echo 11/22 showed EF 35-40% (in setting of atrial flutter).  She is not volume overloaded on exam.  NYHA class II symptoms.  - She was unable to tolerate Entresto due to lightheadedness.  - Continue carvedilol 6.25 mg bid, will not increase with HR in 50s.   - Increase losartan to 25 mg bid.  BMET today and in 10 days.   - Continue dapagliflozin 10 mg daily. - Off spironolactone with hyperkalemia.  Could retry with Lokelma if repeat echo shows low EF in setting of NSR.  - Repeat echo at followup in 3 months.  2. Atrial fibrillation/flutter: She developed atypical atrial flutter after redo  atrial fibrillation ablation in 3/22.  Amiodarone was stopped in 8/22. Back in atypical aflutter 11/22. Amiodarone restarted and underwent successful DCCV. Maintaining SR.  - Continue amiodarone 100 mg daily.  Check LFTs and TSH today.  Will need regular eye exam.  - Continue apixaban 5 mg bid. CBC today.  3. OSA: She has been diagnosed in the past but cannot tolerate CPAP.  4. Obesity: Body mass index is 43.8 kg/m. - Check hgbA1c.  If she has diabetes diagnosis, insurance should allow semaglutide.   Follow up in 3 months with echo .  [name]  08/30/2022

## 2022-09-08 ENCOUNTER — Ambulatory Visit (HOSPITAL_COMMUNITY)
Admission: RE | Admit: 2022-09-08 | Discharge: 2022-09-08 | Disposition: A | Discharge status: Home / Self Care | Payer: Medicare Other | Source: Ambulatory Visit | Attending: Internal Medicine | Admitting: Internal Medicine

## 2022-09-08 DIAGNOSIS — I5022 Chronic systolic (congestive) heart failure: Secondary | ICD-10-CM | POA: Insufficient documentation

## 2022-09-08 LAB — BASIC METABOLIC PANEL
Anion gap: 6 (ref 5–15)
BUN: 22 mg/dL (ref 8–23)
CO2: 28 mmol/L (ref 22–32)
Calcium: 8.9 mg/dL (ref 8.9–10.3)
Chloride: 106 mmol/L (ref 98–111)
Creatinine, Ser: 1.77 mg/dL — ABNORMAL HIGH (ref 0.44–1.00)
GFR, Estimated: 30 mL/min — ABNORMAL LOW (ref >60)
Glucose, Bld: 120 mg/dL — ABNORMAL HIGH (ref 70–99)
Potassium: 4.7 mmol/L (ref 3.5–5.1)
Sodium: 140 mmol/L (ref 135–145)

## 2022-09-12 ENCOUNTER — Telehealth (HOSPITAL_COMMUNITY): Payer: Self-pay | Admitting: Surgery

## 2022-09-12 NOTE — Telephone Encounter (Signed)
I attempted to call patient to set up IV Iron infusions per Dr. Aundra Dubin.  I left a message for return call.

## 2022-09-12 NOTE — Telephone Encounter (Signed)
-----   Message from Micki Riley, RN sent at 09/02/2022  4:01 PM EDT ----- Regarding: IV Iron  ----- Message ----- From: Larey Dresser, MD Sent: 08/29/2022   5:41 PM EDT To: Jerl Mina, RN; [facility] Triage Pool  Transferrin saturation < 20%.  Can we arrange for IV iron?

## 2022-09-20 ENCOUNTER — Other Ambulatory Visit (HOSPITAL_COMMUNITY): Payer: Self-pay | Admitting: Surgery

## 2022-09-20 DIAGNOSIS — D509 Iron deficiency anemia, unspecified: Secondary | ICD-10-CM

## 2022-09-20 MED ORDER — SODIUM CHLORIDE 0.9 % IV SOLN: 510.0000 mg | Freq: Once | INTRAVENOUS | Status: DC

## 2022-09-20 NOTE — Progress Notes (Signed)
I spoke with Ms. Loveall and have placed the order as well as scheduled her Feraheme infusions in the [facility] aper order from Dr. Aundra Dubin and Audry Riles Pharm D.  Patient is aware and agreeable.

## 2022-09-23 ENCOUNTER — Encounter (HOSPITAL_COMMUNITY): Payer: Medicare Other

## 2022-09-23 ENCOUNTER — Other Ambulatory Visit (HOSPITAL_COMMUNITY): Payer: Self-pay | Admitting: *Deleted

## 2022-09-23 DIAGNOSIS — D509 Iron deficiency anemia, unspecified: Secondary | ICD-10-CM

## 2022-09-26 ENCOUNTER — Encounter (HOSPITAL_COMMUNITY)
Admission: RE | Admit: 2022-09-26 | Discharge: 2022-09-26 | Disposition: A | Discharge status: Home / Self Care | Payer: Medicare Other | Source: Ambulatory Visit | Attending: Cardiology | Admitting: Cardiology

## 2022-09-26 DIAGNOSIS — D509 Iron deficiency anemia, unspecified: Secondary | ICD-10-CM | POA: Diagnosis present

## 2022-09-26 MED ORDER — SODIUM CHLORIDE 0.9 % IV SOLN
510.0000 mg | Freq: Once | INTRAVENOUS | Status: AC
  Administered 2022-09-26: 510 mg via INTRAVENOUS
  Filled 2022-09-26: qty 510

## 2022-09-26 MED ORDER — SODIUM CHLORIDE 0.9 % IV SOLN: 510.0000 mg | Freq: Once | INTRAVENOUS | Status: DC

## 2022-09-26 NOTE — Progress Notes (Signed)
Messaged Dr. Aundra Dubin to clarify order and he wants patient to have 2 doses feraheme .

## 2022-09-27 ENCOUNTER — Telehealth (HOSPITAL_COMMUNITY): Payer: Self-pay | Admitting: *Deleted

## 2022-09-27 NOTE — Telephone Encounter (Signed)
Does not have to take the Fe pills.

## 2022-09-27 NOTE — Telephone Encounter (Signed)
Pt asked if she needs to continue taking iron tablets since she had an iron infusion.  Routed to [facility]

## 2022-09-27 NOTE — Telephone Encounter (Signed)
Spoke with pt she is aware.  

## 2022-10-03 ENCOUNTER — Encounter (HOSPITAL_COMMUNITY)
Admission: RE | Admit: 2022-10-03 | Discharge: 2022-10-03 | Disposition: A | Discharge status: Home / Self Care | Payer: Medicare Other | Source: Ambulatory Visit | Attending: Cardiology | Admitting: Cardiology

## 2022-10-03 DIAGNOSIS — D509 Iron deficiency anemia, unspecified: Secondary | ICD-10-CM | POA: Diagnosis not present

## 2022-10-03 MED ORDER — SODIUM CHLORIDE 0.9 % IV SOLN
510.0000 mg | Freq: Once | INTRAVENOUS | Status: AC
  Administered 2022-10-03: 510 mg via INTRAVENOUS
  Filled 2022-10-03: qty 510

## 2022-10-20 ENCOUNTER — Other Ambulatory Visit (HOSPITAL_COMMUNITY): Payer: Self-pay | Admitting: Cardiology

## 2022-11-30 ENCOUNTER — Ambulatory Visit (HOSPITAL_BASED_OUTPATIENT_CLINIC_OR_DEPARTMENT_OTHER)
Admission: RE | Admit: 2022-11-30 | Discharge: 2022-11-30 | Disposition: A | Discharge status: Home / Self Care | Payer: Medicare Other | Source: Ambulatory Visit | Attending: Cardiology | Admitting: Cardiology

## 2022-11-30 ENCOUNTER — Ambulatory Visit (HOSPITAL_COMMUNITY)
Admission: RE | Admit: 2022-11-30 | Discharge: 2022-11-30 | Disposition: A | Discharge status: Home / Self Care | Payer: Medicare Other | Source: Ambulatory Visit | Attending: Family Medicine | Admitting: Family Medicine

## 2022-11-30 ENCOUNTER — Encounter (HOSPITAL_COMMUNITY): Payer: Self-pay | Admitting: Cardiology

## 2022-11-30 ENCOUNTER — Other Ambulatory Visit (HOSPITAL_COMMUNITY): Payer: Self-pay

## 2022-11-30 VITALS — BP 118/70 | HR 56 | Wt 249.2 lb

## 2022-11-30 DIAGNOSIS — G4733 Obstructive sleep apnea (adult) (pediatric): Secondary | ICD-10-CM | POA: Diagnosis not present

## 2022-11-30 DIAGNOSIS — E669 Obesity, unspecified: Secondary | ICD-10-CM | POA: Diagnosis not present

## 2022-11-30 DIAGNOSIS — I484 Atypical atrial flutter: Secondary | ICD-10-CM | POA: Insufficient documentation

## 2022-11-30 DIAGNOSIS — I4891 Unspecified atrial fibrillation: Secondary | ICD-10-CM | POA: Insufficient documentation

## 2022-11-30 DIAGNOSIS — Z7901 Long term (current) use of anticoagulants: Secondary | ICD-10-CM | POA: Insufficient documentation

## 2022-11-30 DIAGNOSIS — Z79899 Other long term (current) drug therapy: Secondary | ICD-10-CM | POA: Insufficient documentation

## 2022-11-30 DIAGNOSIS — I5022 Chronic systolic (congestive) heart failure: Secondary | ICD-10-CM | POA: Diagnosis present

## 2022-11-30 DIAGNOSIS — Z6841 Body Mass Index (BMI) 40.0 and over, adult: Secondary | ICD-10-CM | POA: Insufficient documentation

## 2022-11-30 DIAGNOSIS — E875 Hyperkalemia: Secondary | ICD-10-CM | POA: Insufficient documentation

## 2022-11-30 DIAGNOSIS — R42 Dizziness and giddiness: Secondary | ICD-10-CM | POA: Diagnosis not present

## 2022-11-30 DIAGNOSIS — I428 Other cardiomyopathies: Secondary | ICD-10-CM | POA: Diagnosis not present

## 2022-11-30 LAB — CBC
HCT: 41.3 % (ref 36.0–46.0)
Hemoglobin: 13.5 g/dL (ref 12.0–15.0)
MCH: 30.3 pg (ref 26.0–34.0)
MCHC: 32.7 g/dL (ref 30.0–36.0)
MCV: 92.8 fL (ref 80.0–100.0)
Platelets: 344 10*3/uL (ref 150–400)
RBC: 4.45 MIL/uL (ref 3.87–5.11)
RDW: 14.6 % (ref 11.5–15.5)
WBC: 6.1 10*3/uL (ref 4.0–10.5)
nRBC: 0 % (ref 0.0–0.2)

## 2022-11-30 LAB — COMPREHENSIVE METABOLIC PANEL
ALT: 16 U/L (ref 0–44)
AST: 23 U/L (ref 15–41)
Albumin: 3.7 g/dL (ref 3.5–5.0)
Alkaline Phosphatase: 61 U/L (ref 38–126)
Anion gap: 9 (ref 5–15)
BUN: 18 mg/dL (ref 8–23)
CO2: 27 mmol/L (ref 22–32)
Calcium: 9.4 mg/dL (ref 8.9–10.3)
Chloride: 106 mmol/L (ref 98–111)
Creatinine, Ser: 1.52 mg/dL — ABNORMAL HIGH (ref 0.44–1.00)
GFR, Estimated: 35 mL/min — ABNORMAL LOW (ref >60)
Glucose, Bld: 116 mg/dL — ABNORMAL HIGH (ref 70–99)
Potassium: 4.4 mmol/L (ref 3.5–5.1)
Sodium: 142 mmol/L (ref 135–145)
Total Bilirubin: 0.5 mg/dL (ref 0.3–1.2)
Total Protein: 6.9 g/dL (ref 6.5–8.1)

## 2022-11-30 LAB — ECHOCARDIOGRAM COMPLETE
Area-P 1/2: 4.6 cm2
Calc EF: 41.7 %
MV M vel: 6.16 m/s
MV Peak grad: 151.8 mmHg
P 1/2 time: 622 msec
Radius: 0.4 cm
S' Lateral: 5.1 cm
Single Plane A2C EF: 42.9 %
Single Plane A4C EF: 40.2 %

## 2022-11-30 LAB — TSH: TSH: 3.118 u[IU]/mL (ref 0.350–4.500)

## 2022-11-30 MED ORDER — LOKELMA 5 G PO PACK: 5.0000 g | PACK | Freq: Every day | ORAL | 5 refills | Status: DC

## 2022-11-30 MED ORDER — SPIRONOLACTONE 25 MG PO TABS: 12.5000 mg | ORAL_TABLET | Freq: Every day | ORAL | 3 refills | Status: DC

## 2022-11-30 NOTE — Progress Notes (Signed)
Echocardiogram 2D Echocardiogram has been performed.  Augustine Radar 11/30/2022, 2:02 PM

## 2022-11-30 NOTE — Patient Instructions (Signed)
START  Spironolactone 12.5 mg ( 1/2 Tab ) daily.  START Lokelma 5g daily.  Labs done today, your results will be available in MyChart, we will contact you for abnormal readings.  Repeat blood work in 10 days  Your physician recommends that you schedule a follow-up appointment in: 3 months  If you have any questions or concerns before your next appointment please send [place] a message through [place] or call our office at [phone].    TO LEAVE A MESSAGE FOR THE NURSE SELECT OPTION 2, PLEASE LEAVE A MESSAGE INCLUDING: YOUR NAME DATE OF BIRTH CALL BACK NUMBER REASON FOR CALL**this is important as we prioritize the call backs  YOU WILL RECEIVE A CALL BACK THE SAME DAY AS LONG AS YOU CALL BEFORE 4:00 PM  At the [facility], you and your health needs are our priority. As part of our continuing mission to provide you with exceptional heart care, we have created designated Provider Care Teams. These Care Teams include your primary Cardiologist (physician) and Advanced Practice Providers (APPs- Physician Assistants and Nurse Practitioners) who all work together to provide you with the care you need, when you need it.   You may see any of the following providers on your designated Care Team at your next follow up: Dr Arvilla Meres Dr [name] Dr. Marcos Eke, NP Robbie Lis, [place] [facility] [place], [place] Brynda Peon, NP Karle Plumber, PharmD   Please be sure to bring in all your medications bottles to every appointment.

## 2022-11-30 NOTE — Progress Notes (Signed)
- Patient ID: Kelli Webster, female   DOB: Apr 21, 1946, 76 y.o.   MRN: [id number] PCP: Dr. Andrey Campanile.  EP: Dr Johney Frame HF MD: Dr Shirlee Latch.   76 y.o. woman with h/o obesity, PAF, moderate OSA (cannot tolerate CPAP) and systolic HF.  She was admitted in 09/2013 with severe dyspnea for several days and was found to have atrial fibrillation with RVR and volume overload.  EF 20-25% on echo, diffuse hypokinesis.   She was cardioverted twice but both times went back into atrial fibrillation, the 2nd time on amiodarone.    She had an atrial fibrillation ablation in 12/14 that was successful.  TEE in 12/14 showed EF 30%.  Repeat echo in 4/15 showed EF up to 45%.  Echo in 6/17 showed EF down to 25-30%.  Echo in 6/18 showed EF back up to 45-50%.  In 3/20 she had recurrent atrial fibrillation and was cardioverted.  In 11/20, echo showed EF 40-45% with normal RV.   She was cardioverted again in 12/21 and 1/22.  In 3/22, she had redo atrial fibrillation ablation.  However, she remained in atypical atrial flutter and Dr. Johney Frame stopped her amiodarone in 8/22.  She had COVID-19 in 8/22.   She was in atypical flutter at follow up 11/22. Started on amiodarone and underwent successful DCCV 11/22.  Echo was done today and reviewed, EF 40% with diffuse hypokinesis, mildly decreased RV systolic function, moderate functional MR, PASP 54, IVC normal.   Today she returns for HF follow up.  Weight is down 2 lbs.  She remains in NSR, no palpitations.  She has no significant exertional dyspnea, more limited by back pain recently.  Can climb a flight of stairs.  No orthopnea/paroxysmal nocturnal dyspnea.  No chest pain.    Labs (4/15): K 5.2 creatinine 1.6, digoxin 1.0  Labs (11/15): K 5.1, creatinine 1.4 Labs 09/2015: K 4.6 Creatinine 1.53  Labs (8/17): K 4.8, creatinine 1.53, hgb 11.6 Labs (10/17): K 4.7, creatinine 1.37, BNP 74 Labs (2/18): K 4.9, creatinine 1.47, hgb 11.5 Labs (7/18): hgb 11.6, K 4.5, creatinine 1.5 Labs  (2/20): K 5, creatinine 1.6 Labs (2/22): K 5, creatinine 1.72 Labs (11/22): K 4.7, creatinine 1.97 Labs (02/2022): K 5.1 Creatinine 1.68 Labs (6/23): K 4.7, creatinine 1.65, LFTs normal, hgb 11.2, TSH normal Labs (9/23): K 4.7, creatinine 1.77, LFTs normal, TSH normal  ECG (personally reviewed): NSR, 1st degree AVB,  IVCD 134 msec   PMH:  1. Obesity 2. GERD 3. OA 4. EGD negative in 10/14 5. ACEI cough 6. Atrial fibrillation: First diagnosed in 10/14.  DCCV to NSR in 09/2013 but atrial fibrillation recurred. Cardioversion 10/18/2013 successful on amiodarone but back in atrial fibrillation in 11/14.  Atrial fibrillation ablation ([facility]) 12/14.  - DCCV to NSR in 3/20, 12/21, 1/22 - Redo atrial fibrillation ablation in 3/22.   7. Cardiomyopathy: Echo (10/14) with EF 20-25%, diffuse HK.  LHC (10/14) showed no CAD.  TSH normal, HIV negative, SPEP/UPEP negative.  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  TEE (12/14) with EF 30%, diffuse hypokinesis, mild MR, mildly decreased RV systolic function.  Echo (4/15) with EF 45%, diffuse hypokinesis, mildly dilated LV, normal RV size and systolic function.  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 - Echo (6/17) with EF 25-30%, mild MR.  - Echo (6/18) with EF 45-50%, mildly dilated LV, mild MR.  - Echo (11/20) with EF 40-45%, normal RV.  - Echo (11/22): EF 35-40%, RV normal, mild MR.  - Echo (12/23): EF 40% with diffuse hypokinesis,  mildly decreased RV systolic function, moderate functional MR, PASP 54, IVC normal.  8. Sinus bradycardia 9. OSA: Cannot tolerate CPAP. 10. CKD stage 3 11. Low back pain/sciatica.  12. COVID-19 8/22 13. Fe deficiency anemia  SH: Married, lives in [place], nonsmoker.   FH: No history of cardiomyopathy or premature CAD.   ROS: All systems reviewed and negative except as per HPI.   Current Outpatient Medications  Medication Sig Dispense Refill   acetaminophen (TYLENOL) 325 MG tablet Take 325-650 mg by mouth every 6 (six)  hours as needed for moderate pain or headache.     amiodarone (PACERONE) 200 MG tablet Take 0.5 tablets (100 mg total) by mouth daily.     carvedilol (COREG) 12.5 MG tablet Take 6.25 mg by mouth 2 (two) times daily with a meal.     dapagliflozin propanediol (FARXIGA) 10 MG TABS tablet TAKE 1 TABLET BY MOUTH DAILY BEFORE BREAKFAST. 90 tablet 1   ELIQUIS 5 MG TABS tablet TAKE 1 TABLET BY MOUTH  TWICE DAILY 180 tablet 3   famotidine (PEPCID) 20 MG tablet Take 20 mg by mouth daily as needed for heartburn or indigestion.     lactobacillus acidophilus (BACID) TABS tablet Take 1 tablet by mouth daily in the afternoon.     losartan (COZAAR) 25 MG tablet Take 1 tablet (25 mg total) by mouth 2 (two) times daily. 90 tablet 3   Multiple Vitamins-Minerals (MULTIVITAMIN WITH MINERALS) tablet Take 1 tablet by mouth daily. Centrum Multivitamin     sodium zirconium cyclosilicate (LOKELMA) 5 g packet Take 5 g by mouth daily. 30 each 5   spironolactone (ALDACTONE) 25 MG tablet Take 0.5 tablets (12.5 mg total) by mouth daily. 45 tablet 3   No current facility-administered medications for this encounter.   BP 118/70   Pulse (!) 56   Wt 113 kg (249 lb 3.2 oz)   SpO2 98%   BMI 43.45 kg/m   Wt Readings from Last 3 Encounters:  11/30/22 113 kg (249 lb 3.2 oz)  08/29/22 113.9 kg (251 lb 3.2 oz)  05/20/22 114.5 kg (252 lb 6.4 oz)  General: NAD Neck: No JVD, no thyromegaly or thyroid nodule.  Lungs: Clear to auscultation bilaterally with normal respiratory effort. CV: Nondisplaced PMI.  Heart regular S1/S2, no S3/S4, 1/6 SEM RUSB.  No peripheral edema.  No carotid bruit.  Normal pedal pulses.  Abdomen: Soft, nontender, no hepatosplenomegaly, no distention.  Skin: Intact without lesions or rashes.  Neurologic: Alert and oriented x 3.  Psych: Normal affect. Extremities: No clubbing or cyanosis.  HEENT: Normal.   Assessment/Plan: 1. 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 Echo 05/2016 EF 25-30%, Echo 9/20 with EF 40-45%, echo 11/22 showed EF 35-40% (in setting of atrial flutter).  She is now in NSR with echo today showing EF still low at 40% so suspect her cardiomyopathy is not only tachycardia-mediated.  She is not volume overloaded on exam.  NYHA class II symptoms.  - She was unable to tolerate Entresto due to lightheadedness and does not want to retry.  - Continue carvedilol 6.25 mg bid, will not increase with HR in 50s.   - Continue losartan 25 mg bid.   - Continue dapagliflozin 10 mg daily. - Off spironolactone with hyperkalemia.  I would like to retry spironolactone 12.5 mg daily + Lokelma 5 g daily.  BMET/BNP today, BMET in 10 days.  - EF is out of ICD range.  - I would  like to get a cardiac MRI on her in the future, will set up when next evaluation of LV function is due.  2. Atrial fibrillation/flutter: She developed atypical atrial flutter after redo atrial fibrillation ablation in 3/22.  Amiodarone was stopped in 8/22. Back in atypical aflutter 11/22. Amiodarone restarted and underwent successful DCCV. Maintaining NSR on amiodarone.  - Continue amiodarone 100 mg daily.  Check LFTs and TSH today.  Will need regular eye exam.  - Continue apixaban 5 mg bid. CBC today.  3. OSA: She has been diagnosed in the past but cannot tolerate CPAP.  4. Obesity: Body mass index is 43.45 kg/m. - She would be a good semaglutide candidate, wants to try to lose weight on her own for now.   Follow up in 3 months with APP .  [name]  11/30/2022

## 2022-12-09 ENCOUNTER — Encounter (HOSPITAL_COMMUNITY): Payer: Self-pay

## 2022-12-09 ENCOUNTER — Other Ambulatory Visit (HOSPITAL_COMMUNITY): Payer: Medicare Other

## 2023-01-26 ENCOUNTER — Encounter (HOSPITAL_COMMUNITY): Payer: Self-pay | Admitting: *Deleted

## 2023-01-28 ENCOUNTER — Other Ambulatory Visit (HOSPITAL_COMMUNITY): Payer: Self-pay | Admitting: Cardiology

## 2023-02-26 ENCOUNTER — Other Ambulatory Visit (HOSPITAL_COMMUNITY): Payer: Self-pay | Admitting: Cardiology

## 2023-03-01 ENCOUNTER — Encounter (HOSPITAL_COMMUNITY): Payer: Medicare Other

## 2023-04-18 ENCOUNTER — Encounter (HOSPITAL_COMMUNITY): Payer: Medicare Other

## 2023-04-20 ENCOUNTER — Other Ambulatory Visit (HOSPITAL_COMMUNITY): Payer: Self-pay | Admitting: Cardiology

## 2023-04-24 ENCOUNTER — Other Ambulatory Visit (HOSPITAL_COMMUNITY): Payer: Self-pay | Admitting: Cardiology

## 2023-06-15 ENCOUNTER — Encounter (HOSPITAL_COMMUNITY): Payer: Medicare Other

## 2023-07-17 NOTE — Progress Notes (Signed)
Patient ID: Kelli Webster, female   DOB: 1946-04-11, 77 y.o.   MRN: [id number]  PCP: Dr. Andrey Campanile EP: Dr Johney Frame HF MD: Dr Shirlee Latch.   77 y.o. woman with h/o obesity, PAF, moderate OSA (cannot tolerate CPAP) and systolic HF.  She was admitted in 09/2013 with severe dyspnea for several days and was found to have atrial fibrillation with RVR and volume overload.  EF 20-25% on echo, diffuse hypokinesis.   She was cardioverted twice but both times went back into atrial fibrillation, the 2nd time on amiodarone.    She had an atrial fibrillation ablation in 12/14 that was successful.  TEE in 12/14 showed EF 30%.  Repeat echo in 4/15 showed EF up to 45%.  Echo in 6/17 showed EF down to 25-30%.  Echo in 6/18 showed EF back up to 45-50%.  In 3/20 she had recurrent atrial fibrillation and was cardioverted.  In 11/20, echo showed EF 40-45% with normal RV.   She was cardioverted again in 12/21 and 1/22.  In 3/22, she had redo atrial fibrillation ablation.  However, she remained in atypical atrial flutter and Dr. Johney Frame stopped her amiodarone in 8/22.  She had COVID-19 in 8/22.   She was in atypical flutter at follow up 11/22. Started on amiodarone and underwent successful DCCV 11/22.  Echo 12/23 showed EF 40% with diffuse hypokinesis, mildly decreased RV systolic function, moderate functional MR, PASP 54.   Today she returns for HF follow up. Overall feeling fine. She is not SOB walking on flat ground, mainly limited by chronic low back pain. Denies palpitations, abnormal bleeding, CP, dizziness, edema, or PND/Orthopnea. Appetite ok. No fever or chills. Weight at home 239 pounds. Taking all medications, but she never started [facility] or spiro as directed at last visit.   Labs (8/17): K 4.8, creatinine 1.53, hgb 11.6 Labs (10/17): K 4.7, creatinine 1.37, BNP 74 Labs (2/18): K 4.9, creatinine 1.47, hgb 11.5 Labs (7/18): hgb 11.6, K 4.5, creatinine 1.5 Labs (2/20): K 5, creatinine 1.6 Labs (2/22): K 5,  creatinine 1.72 Labs (11/22): K 4.7, creatinine 1.97 Labs (02/2022): K 5.1 Creatinine 1.68 Labs (6/23): K 4.7, creatinine 1.65, LFTs normal, hgb 11.2, TSH normal Labs (9/23): K 4.7, creatinine 1.77, LFTs normal, TSH normal Labs (12/23): K 4.4, creatinine 1.52, LFTs normal, TSH normal  ECG (personally reviewed): NSR, 1st degree AVB,  IVCD 140 msec   PMH:  1. Obesity 2. GERD 3. OA 4. EGD negative in 10/14 5. ACEI cough 6. Atrial fibrillation: First diagnosed in 10/14.  DCCV to NSR in 09/2013 but atrial fibrillation recurred. Cardioversion 10/18/2013 successful on amiodarone but back in atrial fibrillation in 11/14.  Atrial fibrillation ablation ([facility]) 12/14.  - DCCV to NSR in 3/20, 12/21, 1/22 - Redo atrial fibrillation ablation in 3/22.   7. Cardiomyopathy: Echo (10/14) with EF 20-25%, diffuse HK.  LHC (10/14) showed no CAD.  TSH normal, HIV negative, SPEP/UPEP negative.  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  TEE (12/14) with EF 30%, diffuse hypokinesis, mild MR, mildly decreased RV systolic function.  Echo (4/15) with EF 45%, diffuse hypokinesis, mildly dilated LV, normal RV size and systolic function.  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 - Echo (6/17) with EF 25-30%, mild MR.  - Echo (6/18) with EF 45-50%, mildly dilated LV, mild MR.  - Echo (11/20) with EF 40-45%, normal RV.  - Echo (11/22): EF 35-40%, RV normal, mild MR.  - Echo (12/23): EF 40% with diffuse hypokinesis, mildly decreased RV systolic function, moderate functional  MR, PASP 54, IVC normal.  8. Sinus bradycardia 9. OSA: Cannot tolerate CPAP. 10. CKD stage 3 11. Low back pain/sciatica.  12. COVID-19 8/22 13. Fe deficiency anemia  SH: Married, lives in [place], nonsmoker.   FH: No history of cardiomyopathy or premature CAD.   ROS: All systems reviewed and negative except as per HPI.   Current Outpatient Medications  Medication Sig Dispense Refill   acetaminophen (TYLENOL) 325 MG tablet Take 325-650 mg by  mouth every 6 (six) hours as needed for moderate pain or headache.     amiodarone (PACERONE) 200 MG tablet TAKE 1 TABLET BY MOUTH EVERY DAY (Patient taking differently: Take 100 mg by mouth daily.) 90 tablet 6   carvedilol (COREG) 12.5 MG tablet Take 6.25 mg by mouth 2 (two) times daily with a meal.     dapagliflozin propanediol (FARXIGA) 10 MG TABS tablet TAKE 1 TABLET BY MOUTH EVERY DAY BEFORE BREAKFAST 90 tablet 3   ELIQUIS 5 MG TABS tablet TAKE 1 TABLET BY MOUTH TWICE  DAILY 200 tablet 2   famotidine (PEPCID) 20 MG tablet Take 20 mg by mouth daily as needed for heartburn or indigestion.     lactobacillus acidophilus (BACID) TABS tablet Take 1 tablet by mouth daily in the afternoon.     losartan (COZAAR) 25 MG tablet TAKE 1 TABLET BY MOUTH TWICE A DAY 180 tablet 1   No current facility-administered medications for this encounter.   BP 110/72   Pulse (!) 57   Wt 113 kg (249 lb 3.2 oz)   SpO2 97%   BMI 43.45 kg/m   Wt Readings from Last 3 Encounters:  07/18/23 113 kg (249 lb 3.2 oz)  11/30/22 113 kg (249 lb 3.2 oz)  08/29/22 113.9 kg (251 lb 3.2 oz)   Physical Exam General:  NAD. No resp difficulty, walked into clinic with cane HEENT: Normal Neck: Supple. No JVD. Carotids 2+ bilat; no bruits. No lymphadenopathy or thryomegaly appreciated. Cor: PMI nondisplaced. Regular rate & rhythm. No rubs, gallops or murmurs. Lungs: Clear Abdomen: Soft, obese, nontender, nondistended. No hepatosplenomegaly. No bruits or masses. Good bowel sounds. Extremities: No cyanosis, clubbing, rash, edema Neuro: Alert & oriented x 3, cranial nerves grossly intact. Moves all 4 extremities w/o difficulty. Affect pleasant.  Assessment/Plan: 1. 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 Echo 6/17 EF 25-30%, Echo 9/20 with EF 40-45%, echo 11/22 showed EF 35-40% (in setting of atrial flutter).  Echo 12/23 showed EF still low at 40% so suspect her cardiomyopathy is not  only tachycardia-mediated.  She is not volume overloaded on exam.  NYHA class II symptoms.  - Restart spiro 12.5 mg daily. BMET/BNP today repeat BMET in 1 week - Will give Lokelma 10 g sample today. Lokelma and Veltassa too are expensive for her to take daily (personally discussed with pharmacy team). If she fails spiro due to hyperkalemia, would stop and not re-challenge. - Continue carvedilol 6.25 mg bid, will not increase with HR in 50s.   - Continue losartan 25 mg bid (Unable to tolerate Entresto due to lightheadedness, and does not want to retry). - Continue dapagliflozin 10 mg daily. - EF is out of ICD range.  - Would like to get a cardiac MRI on her in the future. We discussed this today. I offered anxiolytic for pre-medication, but she says she is too claustrophobic and will be unable to complete. - Arrange echo next visit. 2. Atrial fibrillation/flutter: She developed atypical atrial flutter  after redo atrial fibrillation ablation in 3/22.  Amiodarone was stopped in 8/22. Back in atypical aflutter 11/22. Amiodarone restarted and underwent successful DCCV. Maintaining NSR on amiodarone.  - Continue amiodarone 100 mg daily.  Good LFTs/TSH (12/23). Will need regular eye exam.  - Continue apixaban 5 mg bid. CBC today. 3. OSA: She has been diagnosed in the past but cannot tolerate CPAP.  4. Obesity: Body mass index is 43.45 kg/m. - We discussed semaglutide, wants to try to lose weight on her own for now.   Follow up in 3-4 months with Dr. Shirlee Latch + echo. Anderson Malta Abrie Egloff FNP-BC 07/18/2023

## 2023-07-18 ENCOUNTER — Encounter (HOSPITAL_COMMUNITY): Payer: Self-pay

## 2023-07-18 ENCOUNTER — Ambulatory Visit (HOSPITAL_COMMUNITY)
Admission: RE | Admit: 2023-07-18 | Discharge: 2023-07-18 | Disposition: A | Discharge status: Home / Self Care | Payer: Medicare Other | Source: Ambulatory Visit | Attending: Family Medicine | Admitting: Family Medicine

## 2023-07-18 ENCOUNTER — Other Ambulatory Visit (HOSPITAL_COMMUNITY): Payer: Self-pay

## 2023-07-18 VITALS — BP 110/72 | HR 57 | Wt 249.2 lb

## 2023-07-18 DIAGNOSIS — I5022 Chronic systolic (congestive) heart failure: Secondary | ICD-10-CM | POA: Diagnosis present

## 2023-07-18 DIAGNOSIS — Z79899 Other long term (current) drug therapy: Secondary | ICD-10-CM | POA: Insufficient documentation

## 2023-07-18 DIAGNOSIS — Z6841 Body Mass Index (BMI) 40.0 and over, adult: Secondary | ICD-10-CM | POA: Insufficient documentation

## 2023-07-18 DIAGNOSIS — I480 Paroxysmal atrial fibrillation: Secondary | ICD-10-CM | POA: Diagnosis not present

## 2023-07-18 DIAGNOSIS — I484 Atypical atrial flutter: Secondary | ICD-10-CM | POA: Diagnosis not present

## 2023-07-18 DIAGNOSIS — I428 Other cardiomyopathies: Secondary | ICD-10-CM | POA: Diagnosis not present

## 2023-07-18 DIAGNOSIS — G4733 Obstructive sleep apnea (adult) (pediatric): Secondary | ICD-10-CM | POA: Diagnosis not present

## 2023-07-18 DIAGNOSIS — I4891 Unspecified atrial fibrillation: Secondary | ICD-10-CM | POA: Insufficient documentation

## 2023-07-18 DIAGNOSIS — E669 Obesity, unspecified: Secondary | ICD-10-CM | POA: Insufficient documentation

## 2023-07-18 DIAGNOSIS — Z7901 Long term (current) use of anticoagulants: Secondary | ICD-10-CM | POA: Diagnosis not present

## 2023-07-18 LAB — CBC
HCT: 35.4 % — ABNORMAL LOW (ref 36.0–46.0)
Hemoglobin: 10.6 g/dL — ABNORMAL LOW (ref 12.0–15.0)
MCH: 24.6 pg — ABNORMAL LOW (ref 26.0–34.0)
MCHC: 29.9 g/dL — ABNORMAL LOW (ref 30.0–36.0)
MCV: 82.1 fL (ref 80.0–100.0)
Platelets: 436 10*3/uL — ABNORMAL HIGH (ref 150–400)
RBC: 4.31 MIL/uL (ref 3.87–5.11)
RDW: 14.3 % (ref 11.5–15.5)
WBC: 8.1 10*3/uL (ref 4.0–10.5)
nRBC: 0 % (ref 0.0–0.2)

## 2023-07-18 LAB — BASIC METABOLIC PANEL
Anion gap: 8 (ref 5–15)
BUN: 27 mg/dL — ABNORMAL HIGH (ref 8–23)
CO2: 26 mmol/L (ref 22–32)
Calcium: 10.7 mg/dL — ABNORMAL HIGH (ref 8.9–10.3)
Chloride: 106 mmol/L (ref 98–111)
Creatinine, Ser: 1.56 mg/dL — ABNORMAL HIGH (ref 0.44–1.00)
GFR, Estimated: 34 mL/min — ABNORMAL LOW (ref >60)
Glucose, Bld: 129 mg/dL — ABNORMAL HIGH (ref 70–99)
Potassium: 4.7 mmol/L (ref 3.5–5.1)
Sodium: 140 mmol/L (ref 135–145)

## 2023-07-18 LAB — BRAIN NATRIURETIC PEPTIDE: B Natriuretic Peptide: 147.3 pg/mL — ABNORMAL HIGH (ref 0.0–100.0)

## 2023-07-18 MED ORDER — SPIRONOLACTONE 25 MG PO TABS: 12.5000 mg | ORAL_TABLET | Freq: Every day | ORAL | 3 refills | Status: DC

## 2023-07-18 NOTE — Addendum Note (Signed)
Encounter addended by: Demetrius Charity, RN on: 07/18/2023 2:06 PM  Actions taken: Order list changed, Diagnosis association updated, Clinical Note Signed, Charge Capture section accepted

## 2023-07-18 NOTE — Patient Instructions (Addendum)
Thank you for coming in today  If you had labs drawn today, any labs that are abnormal the clinic will call you No news is good news  You were given a pack of Lokelma please do not take until directed by the clinic  Medications: START Spironolactone 12.5 mg 1/2 tablet daily  Follow up appointments: Your physician recommends that you return for lab work in: 1 week for BMET   Your physician recommends that you schedule a follow-up appointment in:  4 months with echocardiogram With Dr. Earlean Shawl will receive a reminder letter in the mail a few months in advance. If you don't receive a letter, please call our office to schedule the follow-up appointment.  Your physician has requested that you have an echocardiogram. Echocardiography is a painless test that uses sound waves to create images of your heart. It provides your doctor with information about the size and shape of your heart and how well your heart's chambers and valves are working. This procedure takes approximately one hour. There are no restrictions for this procedure.      Do the following things EVERYDAY: Weigh yourself in the morning before breakfast. Write it down and keep it in a log. Take your medicines as prescribed Eat low salt foods--Limit salt (sodium) to 2000 mg per day.  Stay as active as you can everyday Limit all fluids for the day to less than 2 liters   At the [facility], you and your health needs are our priority. As part of our continuing mission to provide you with exceptional heart care, we have created designated Provider Care Teams. These Care Teams include your primary Cardiologist (physician) and Advanced Practice Providers (APPs- Physician Assistants and Nurse Practitioners) who all work together to provide you with the care you need, when you need it.   You may see any of the following providers on your designated Care Team at your next follow up: Dr Arvilla Meres Dr [name] Dr. Marcos Eke, NP Robbie Lis, [place] [facility] [place], [place] Brynda Peon, NP Karle Plumber, PharmD   Please be sure to bring in all your medications bottles to every appointment.    Thank you for choosing [facility]  If you have any questions or concerns before your next appointment please send [place] a message through [place] or call our office at [phone].    TO LEAVE A MESSAGE FOR THE NURSE SELECT OPTION 2, PLEASE LEAVE A MESSAGE INCLUDING: YOUR NAME DATE OF BIRTH CALL BACK NUMBER REASON FOR CALL**this is important as we prioritize the call backs  YOU WILL RECEIVE A CALL BACK THE SAME DAY AS LONG AS YOU CALL BEFORE 4:00 PM

## 2023-07-25 ENCOUNTER — Ambulatory Visit (HOSPITAL_COMMUNITY)
Admission: RE | Admit: 2023-07-25 | Discharge: 2023-07-25 | Disposition: A | Discharge status: Home / Self Care | Payer: Medicare Other | Source: Ambulatory Visit | Attending: Cardiology | Admitting: Cardiology

## 2023-07-25 DIAGNOSIS — I5022 Chronic systolic (congestive) heart failure: Secondary | ICD-10-CM | POA: Diagnosis not present

## 2023-07-25 LAB — BASIC METABOLIC PANEL
Anion gap: 10 (ref 5–15)
BUN: 23 mg/dL (ref 8–23)
CO2: 26 mmol/L (ref 22–32)
Calcium: 10.3 mg/dL (ref 8.9–10.3)
Chloride: 101 mmol/L (ref 98–111)
Creatinine, Ser: 1.78 mg/dL — ABNORMAL HIGH (ref 0.44–1.00)
GFR, Estimated: 29 mL/min — ABNORMAL LOW (ref >60)
Glucose, Bld: 137 mg/dL — ABNORMAL HIGH (ref 70–99)
Potassium: 4.4 mmol/L (ref 3.5–5.1)
Sodium: 137 mmol/L (ref 135–145)

## 2023-07-26 ENCOUNTER — Telehealth (HOSPITAL_COMMUNITY): Payer: Self-pay

## 2023-07-26 DIAGNOSIS — D509 Iron deficiency anemia, unspecified: Secondary | ICD-10-CM

## 2023-07-26 NOTE — Telephone Encounter (Addendum)
Pt aware, agreeable, and verbalized understanding  Labs scheduled   ----- Message from Jacklynn Ganong sent at 07/26/2023  3:53 PM EDT ----- Please call

## 2023-08-09 ENCOUNTER — Ambulatory Visit (HOSPITAL_COMMUNITY)
Admission: RE | Admit: 2023-08-09 | Discharge: 2023-08-09 | Disposition: A | Discharge status: Home / Self Care | Payer: Medicare Other | Source: Ambulatory Visit | Attending: Internal Medicine | Admitting: Internal Medicine

## 2023-08-09 DIAGNOSIS — D509 Iron deficiency anemia, unspecified: Secondary | ICD-10-CM | POA: Diagnosis present

## 2023-08-09 LAB — CBC
HCT: 34.9 % — ABNORMAL LOW (ref 36.0–46.0)
Hemoglobin: 10.5 g/dL — ABNORMAL LOW (ref 12.0–15.0)
MCH: 25 pg — ABNORMAL LOW (ref 26.0–34.0)
MCHC: 30.1 g/dL (ref 30.0–36.0)
MCV: 83.1 fL (ref 80.0–100.0)
Platelets: 470 10*3/uL — ABNORMAL HIGH (ref 150–400)
RBC: 4.2 MIL/uL (ref 3.87–5.11)
RDW: 15.5 % (ref 11.5–15.5)
WBC: 8.5 10*3/uL (ref 4.0–10.5)
nRBC: 0 % (ref 0.0–0.2)

## 2023-08-24 ENCOUNTER — Other Ambulatory Visit (HOSPITAL_COMMUNITY): Payer: Self-pay | Admitting: Cardiology

## 2023-08-31 ENCOUNTER — Other Ambulatory Visit (HOSPITAL_COMMUNITY): Payer: Self-pay | Admitting: Cardiology

## 2023-09-17 ENCOUNTER — Telehealth: Payer: Self-pay | Admitting: Cardiology

## 2023-09-17 NOTE — Telephone Encounter (Signed)
Outpatient service line: Chief complaint: Tachycardia  Patient with history of atrial flutter/fibrillation status post multiple ablations and cardioversions.  Sounds like she has maintained normal rhythm for quite some time and has been managed on amiodarone 100 mg daily.  She now reports having an episode yesterday where she had heart rates in the high 170s and had brief shortness of breath for 1 to 2 minutes.  She repeatedly checked her heart rate and it had fluctuated a little bit however sounded like she may have been back in normal sinus rhythm with heart rates between 50-100.  I discussed with patient and told her to increase her amiodarone to 200 mg, 1 tablet daily for the time being until further recommendations were given to her.  Will forward to primary providers for further recommendations given that she has had recurrence of A-fib/flutter on amiodarone status post ablations.

## 2023-09-17 NOTE — Telephone Encounter (Signed)
Keep amiodarone at 200 mg daily for now.  Have her come to office Monday for ECG.  Make sure she has APP clinic followup.

## 2023-09-18 ENCOUNTER — Observation Stay (HOSPITAL_COMMUNITY)
Admission: EM | Admit: 2023-09-18 | Discharge: 2023-09-19 | Disposition: A | Discharge status: Home / Self Care | Payer: Medicare Other | Attending: Cardiology | Admitting: Cardiology

## 2023-09-18 ENCOUNTER — Emergency Department (HOSPITAL_COMMUNITY): Payer: Medicare Other

## 2023-09-18 ENCOUNTER — Other Ambulatory Visit: Payer: Self-pay

## 2023-09-18 ENCOUNTER — Ambulatory Visit (HOSPITAL_COMMUNITY)
Admission: RE | Admit: 2023-09-18 | Discharge: 2023-09-18 | Disposition: A | Discharge status: Home / Self Care | Payer: Medicare Other | Source: Ambulatory Visit | Attending: Internal Medicine | Admitting: Internal Medicine

## 2023-09-18 ENCOUNTER — Encounter (HOSPITAL_COMMUNITY): Payer: Self-pay

## 2023-09-18 VITALS — BP 79/58 | HR 176 | Wt 250.2 lb

## 2023-09-18 DIAGNOSIS — I9589 Other hypotension: Secondary | ICD-10-CM

## 2023-09-18 DIAGNOSIS — I5042 Chronic combined systolic (congestive) and diastolic (congestive) heart failure: Secondary | ICD-10-CM | POA: Diagnosis present

## 2023-09-18 DIAGNOSIS — R7989 Other specified abnormal findings of blood chemistry: Secondary | ICD-10-CM | POA: Diagnosis not present

## 2023-09-18 DIAGNOSIS — Z79899 Other long term (current) drug therapy: Secondary | ICD-10-CM | POA: Insufficient documentation

## 2023-09-18 DIAGNOSIS — G4733 Obstructive sleep apnea (adult) (pediatric): Secondary | ICD-10-CM | POA: Diagnosis present

## 2023-09-18 DIAGNOSIS — I959 Hypotension, unspecified: Secondary | ICD-10-CM

## 2023-09-18 DIAGNOSIS — Z7901 Long term (current) use of anticoagulants: Secondary | ICD-10-CM | POA: Diagnosis not present

## 2023-09-18 DIAGNOSIS — R000 Tachycardia, unspecified: Principal | ICD-10-CM

## 2023-09-18 DIAGNOSIS — I4891 Unspecified atrial fibrillation: Secondary | ICD-10-CM | POA: Insufficient documentation

## 2023-09-18 DIAGNOSIS — R002 Palpitations: Secondary | ICD-10-CM

## 2023-09-18 DIAGNOSIS — Z7984 Long term (current) use of oral hypoglycemic drugs: Secondary | ICD-10-CM | POA: Diagnosis not present

## 2023-09-18 DIAGNOSIS — I4819 Other persistent atrial fibrillation: Secondary | ICD-10-CM | POA: Diagnosis not present

## 2023-09-18 DIAGNOSIS — I4892 Unspecified atrial flutter: Principal | ICD-10-CM | POA: Diagnosis present

## 2023-09-18 DIAGNOSIS — I5022 Chronic systolic (congestive) heart failure: Secondary | ICD-10-CM | POA: Diagnosis present

## 2023-09-18 DIAGNOSIS — N179 Acute kidney failure, unspecified: Secondary | ICD-10-CM | POA: Diagnosis not present

## 2023-09-18 DIAGNOSIS — N189 Chronic kidney disease, unspecified: Secondary | ICD-10-CM | POA: Insufficient documentation

## 2023-09-18 DIAGNOSIS — R0602 Shortness of breath: Secondary | ICD-10-CM | POA: Diagnosis present

## 2023-09-18 DIAGNOSIS — R9431 Abnormal electrocardiogram [ECG] [EKG]: Secondary | ICD-10-CM | POA: Insufficient documentation

## 2023-09-18 LAB — HEPATIC FUNCTION PANEL
ALT: 203 U/L — ABNORMAL HIGH (ref 0–44)
AST: 234 U/L — ABNORMAL HIGH (ref 15–41)
Albumin: 2.7 g/dL — ABNORMAL LOW (ref 3.5–5.0)
Alkaline Phosphatase: 50 U/L (ref 38–126)
Bilirubin, Direct: 0.1 mg/dL (ref 0.0–0.2)
Indirect Bilirubin: 0.5 mg/dL (ref 0.3–0.9)
Total Bilirubin: 0.6 mg/dL (ref 0.3–1.2)
Total Protein: 5.1 g/dL — ABNORMAL LOW (ref 6.5–8.1)

## 2023-09-18 LAB — CBC
HCT: 37 % (ref 36.0–46.0)
Hemoglobin: 10.9 g/dL — ABNORMAL LOW (ref 12.0–15.0)
MCH: 25.6 pg — ABNORMAL LOW (ref 26.0–34.0)
MCHC: 29.5 g/dL — ABNORMAL LOW (ref 30.0–36.0)
MCV: 87.1 fL (ref 80.0–100.0)
Platelets: 550 10*3/uL — ABNORMAL HIGH (ref 150–400)
RBC: 4.25 MIL/uL (ref 3.87–5.11)
RDW: 21.1 % — ABNORMAL HIGH (ref 11.5–15.5)
WBC: 12 10*3/uL — ABNORMAL HIGH (ref 4.0–10.5)
nRBC: 0 % (ref 0.0–0.2)

## 2023-09-18 LAB — BASIC METABOLIC PANEL
Anion gap: 13 (ref 5–15)
BUN: 50 mg/dL — ABNORMAL HIGH (ref 8–23)
CO2: 19 mmol/L — ABNORMAL LOW (ref 22–32)
Calcium: 10.6 mg/dL — ABNORMAL HIGH (ref 8.9–10.3)
Chloride: 102 mmol/L (ref 98–111)
Creatinine, Ser: 3.09 mg/dL — ABNORMAL HIGH (ref 0.44–1.00)
GFR, Estimated: 15 mL/min — ABNORMAL LOW (ref >60)
Glucose, Bld: 199 mg/dL — ABNORMAL HIGH (ref 70–99)
Potassium: 5.2 mmol/L — ABNORMAL HIGH (ref 3.5–5.1)
Sodium: 134 mmol/L — ABNORMAL LOW (ref 135–145)

## 2023-09-18 LAB — BRAIN NATRIURETIC PEPTIDE: B Natriuretic Peptide: 2317.5 pg/mL — ABNORMAL HIGH (ref 0.0–100.0)

## 2023-09-18 LAB — MAGNESIUM: Magnesium: 2.1 mg/dL (ref 1.7–2.4)

## 2023-09-18 LAB — TSH: TSH: 2.519 u[IU]/mL (ref 0.350–4.500)

## 2023-09-18 LAB — TROPONIN I (HIGH SENSITIVITY)
Troponin I (High Sensitivity): 488 ng/L (ref <18)
Troponin I (High Sensitivity): 960 ng/L (ref <18)

## 2023-09-18 MED ORDER — ADENOSINE 6 MG/2ML IV SOLN: 6.0000 mg | Freq: Once | INTRAVENOUS | Status: AC

## 2023-09-18 MED ORDER — APIXABAN 5 MG PO TABS
5.0000 mg | ORAL_TABLET | Freq: Two times a day (BID) | ORAL | Status: DC
  Administered 2023-09-18 – 2023-09-19 (×2): 5 mg via ORAL
  Filled 2023-09-18 (×2): qty 1

## 2023-09-18 MED ORDER — ADENOSINE 6 MG/2ML IV SOLN
INTRAVENOUS | Status: AC
  Administered 2023-09-18: 6 mg via INTRAVENOUS
  Filled 2023-09-18: qty 6

## 2023-09-18 MED ORDER — ADENOSINE 6 MG/2ML IV SOLN
12.0000 mg | Freq: Once | INTRAVENOUS | Status: AC
  Administered 2023-09-18: 12 mg via INTRAVENOUS

## 2023-09-18 MED ORDER — AMIODARONE HCL 100 MG PO TABS
100.0000 mg | ORAL_TABLET | Freq: Every day | ORAL | Status: DC
  Administered 2023-09-19: 100 mg via ORAL
  Filled 2023-09-18: qty 1

## 2023-09-18 MED ORDER — PROPOFOL 10 MG/ML IV BOLUS
0.5000 mg/kg | Freq: Once | INTRAVENOUS | Status: AC
  Administered 2023-09-18: 50 mg via INTRAVENOUS
  Filled 2023-09-18: qty 20

## 2023-09-18 NOTE — Procedures (Signed)
Procedure:   DCCV  Indication:  Symptomatic atrial flutter  Procedure Note:  The patient signed informed consent.  They have had had therapeutic anticoagulation with apixaban greater than 3 weeks.  Anesthesia was administered by Dr. Rush Landmark.  Patient received 50 mg IV propofol.Adequate airway was maintained throughout and vital followed per protocol.  They were cardioverted x 1 with 150J of biphasic synchronized energy.  They converted to sinus bradycardia.  There were no apparent complications.  The patient had normal neuro status and respiratory status post procedure with vitals stable as recorded elsewhere.    Follow up:  They will continue on current medical therapy and follow up with cardiology as scheduled.  Jodelle Red, MD PhD 09/18/2023 2:54 PM

## 2023-09-18 NOTE — Telephone Encounter (Signed)
Done NV and fu appt made Pt aware

## 2023-09-18 NOTE — ED Provider Notes (Signed)
[place] EMERGENCY DEPARTMENT AT [facility] Provider Note   CSN: [id number] Arrival date & time: 09/18/23  1208     History  Chief Complaint  Patient presents with   sent by cardiologist    Kelli Webster is a 77 y.o. female.  The history is provided by the patient, a significant other and medical records. No language interpreter was used.  Shortness of Breath Severity:  Mild Onset quality:  Gradual Timing:  Constant Progression:  Waxing and waning Context: not URI   Relieved by:  Nothing Worsened by:  Nothing Ineffective treatments:  None tried Associated symptoms: no abdominal pain, no chest pain, no cough, no fever, no headaches, no neck pain, no rash, no sputum production, no vomiting and no wheezing   Risk factors: no hx of PE/DVT        Home Medications Prior to Admission medications   Medication Sig Start Date End Date Taking? Authorizing Provider  acetaminophen (TYLENOL) 325 MG tablet Take 325-650 mg by mouth every 6 (six) hours as needed for moderate pain or headache.    [provider]  amiodarone (PACERONE) 200 MG tablet TAKE 1 TABLET BY MOUTH EVERY DAY Patient taking differently: Take 100 mg by mouth daily. 01/30/23   Laurey Morale, MD  carvedilol (COREG) 6.25 MG tablet Take 1 tablet (6.25 mg total) by mouth 2 (two) times daily with a meal. 08/24/23   Laurey Morale, MD  dapagliflozin propanediol (FARXIGA) 10 MG TABS tablet TAKE 1 TABLET BY MOUTH EVERY DAY BEFORE BREAKFAST 04/24/23   Laurey Morale, MD  ELIQUIS 5 MG TABS tablet TAKE 1 TABLET BY MOUTH TWICE  DAILY 04/20/23   Laurey Morale, MD  famotidine (PEPCID) 20 MG tablet Take 20 mg by mouth daily as needed for heartburn or indigestion.    [provider]  lactobacillus acidophilus (BACID) TABS tablet Take 1 tablet by mouth daily in the afternoon.    [provider]  losartan (COZAAR) 25 MG tablet TAKE 1 TABLET BY MOUTH TWICE A DAY 08/31/23   Laurey Morale, MD   spironolactone (ALDACTONE) 25 MG tablet Take 0.5 tablets (12.5 mg total) by mouth daily. 07/18/23 10/16/23  Jacklynn Ganong, FNP      Allergies    Patient has no known allergies.    Review of Systems   Review of Systems  Constitutional:  Positive for fatigue. Negative for chills and fever.  HENT:  Negative for congestion.   Eyes:  Negative for visual disturbance.  Respiratory:  Positive for chest tightness and shortness of breath. Negative for cough, sputum production and wheezing.   Cardiovascular:  Positive for palpitations. Negative for chest pain and leg swelling.  Gastrointestinal:  Positive for nausea. Negative for abdominal pain, constipation, diarrhea and vomiting.  Genitourinary:  Negative for dysuria and flank pain.  Musculoskeletal:  Negative for back pain, neck pain and neck stiffness.  Skin:  Negative for rash.  Neurological:  Positive for light-headedness. Negative for dizziness and headaches.  Psychiatric/Behavioral:  Negative for agitation and behavioral problems.   All other systems reviewed and are negative.   Physical Exam Updated Vital Signs BP 92/74   Pulse (!) 171   Temp 98.8 F (37.1 C) (Oral)   Resp 20   Ht 5' 3.5" (1.613 m)   Wt 113.5 kg   SpO2 95%   BMI 43.63 kg/m  Physical Exam Vitals and nursing note reviewed.  Constitutional:      General: She is not  in acute distress.    Appearance: She is well-developed. She is not ill-appearing, toxic-appearing or diaphoretic.  HENT:     Head: Normocephalic and atraumatic.     Nose: No congestion or rhinorrhea.     Mouth/Throat:     Mouth: Mucous membranes are moist.     Pharynx: No oropharyngeal exudate or posterior oropharyngeal erythema.  Eyes:     Extraocular Movements: Extraocular movements intact.     Conjunctiva/sclera: Conjunctivae normal.     Pupils: Pupils are equal, round, and reactive to light.  Cardiovascular:     Rate and Rhythm: Tachycardia present. Rhythm irregular.     Heart  sounds: No murmur heard. Pulmonary:     Effort: Pulmonary effort is normal. No respiratory distress.     Breath sounds: Normal breath sounds. No wheezing, rhonchi or rales.  Chest:     Chest wall: No tenderness.  Abdominal:     General: Abdomen is flat.     Palpations: Abdomen is soft.     Tenderness: There is no abdominal tenderness. There is no right CVA tenderness, left CVA tenderness, guarding or rebound.  Musculoskeletal:        General: No swelling or tenderness.     Cervical back: Neck supple. No tenderness.     Right lower leg: Edema present.     Left lower leg: Edema present.  Skin:    General: Skin is warm and dry.     Capillary Refill: Capillary refill takes less than 2 seconds.     Findings: No erythema or rash.  Neurological:     General: No focal deficit present.     Mental Status: She is alert.  Psychiatric:        Mood and Affect: Mood normal.     ED Results / Procedures / Treatments   Labs (all labs ordered are listed, but only abnormal results are displayed) Labs Reviewed  BASIC METABOLIC PANEL - Abnormal; Notable for the following components:      Result Value   Sodium 134 (*)    Potassium 5.2 (*)    CO2 19 (*)    Glucose, Bld 199 (*)    BUN 50 (*)    Creatinine, Ser 3.09 (*)    Calcium 10.6 (*)    GFR, Estimated 15 (*)    All other components within normal limits  CBC - Abnormal; Notable for the following components:   WBC 12.0 (*)    Hemoglobin 10.9 (*)    MCH 25.6 (*)    MCHC 29.5 (*)    RDW 21.1 (*)    Platelets 550 (*)    All other components within normal limits  HEPATIC FUNCTION PANEL - Abnormal; Notable for the following components:   Total Protein 5.1 (*)    Albumin 2.7 (*)    AST 234 (*)    ALT 203 (*)    All other components within normal limits  TSH  MAGNESIUM  BRAIN NATRIURETIC PEPTIDE  TROPONIN I (HIGH SENSITIVITY)  TROPONIN I (HIGH SENSITIVITY)    EKG EKG Interpretation Date/Time:  Monday September 18 2023 12:21:12  EDT Ventricular Rate:  178 PR Interval:    QRS Duration:  110 QT Interval:  282 QTC Calculation: 485 R Axis:   5  Text Interpretation: Supraventricular tachycardia with Premature ventricular complexes or Fusion complexes Marked ST abnormality, possible inferolateral subendocardial injury Abnormal ECG When compared with ECG of 18-Sep-2023 11:40, PREVIOUS ECG IS PRESENT when compared to prior, now appears to show  SVT. Confirmed by Theda Belfast ([id number]) on 09/18/2023 12:50:45 PM  Radiology No results found.  Procedures .Sedation  Date/Time: 09/18/2023 3:51 PM  Performed by: Heide Scales, MD Authorized by: Heide Scales, MD   Consent:    Consent obtained:  Written   Consent given by:  Patient   Risks discussed:  Dysrhythmia, inadequate sedation, respiratory compromise necessitating ventilatory assistance and intubation, prolonged sedation necessitating reversal and prolonged hypoxia resulting in organ damage   Alternatives discussed:  Analgesia without sedation Universal protocol:    Immediately prior to procedure, a time out was called: yes   Indications:    Procedure performed:  Cardioversion   Procedure necessitating sedation performed by:  Different physician Pre-sedation assessment:    Time since last food or drink:  Hours   ASA classification: class 2 - patient with mild systemic disease     Mallampati score:  I - soft palate, uvula, fauces, pillars visible   Pre-sedation assessments completed and reviewed: pre-procedure airway patency not reviewed, pre-procedure cardiovascular function not reviewed, pre-procedure hydration status not reviewed, pre-procedure mental status not reviewed, pre-procedure nausea and vomiting status not reviewed, pre-procedure pain level not reviewed and pre-procedure respiratory function not reviewed   Immediate pre-procedure details:    Reassessment: Patient reassessed immediately prior to procedure   Procedure details (see MAR  for exact dosages):    Preoxygenation:  Nasal cannula   Sedation:  Propofol   Intended level of sedation: deep   Intra-procedure monitoring:  Blood pressure monitoring, cardiac monitor, continuous pulse oximetry, continuous capnometry, frequent vital sign checks and frequent LOC assessments   Intra-procedure events: none     Total Provider sedation time (minutes):  30 Post-procedure details:    Attendance: Constant attendance by certified staff until patient recovered     Recovery: Patient returned to pre-procedure baseline     Patient is stable for discharge or admission: yes     Procedure completion:  Tolerated well, no immediate complications     CRITICAL CARE Performed by: Canary Brim Aribella Vavra Total critical care time: 35 minutes Critical care time was exclusive of separately billable procedures and treating other patients. Critical care was necessary to treat or prevent imminent or life-threatening deterioration. Critical care was time spent personally by me on the following activities: development of treatment plan with patient and/or surrogate as well as nursing, discussions with consultants, evaluation of patient's response to treatment, examination of patient, obtaining history from patient or surrogate, ordering and performing treatments and interventions, ordering and review of laboratory studies, ordering and review of radiographic studies, pulse oximetry and re-evaluation of patient's condition.   Medications Ordered in ED Medications  amiodarone (PACERONE) tablet 100 mg (100 mg Oral Not Given 09/18/23 1536)  apixaban (ELIQUIS) tablet 5 mg (has no administration in time range)  adenosine (ADENOCARD) 6 MG/2ML injection 6 mg (6 mg Intravenous Given 09/18/23 1413)  adenosine (ADENOCARD) 6 MG/2ML injection 12 mg (12 mg Intravenous Given 09/18/23 1416)  propofol (DIPRIVAN) 10 mg/mL bolus/IV push 56.8 mg (50 mg Intravenous Given 09/18/23 1425)    ED Course/ Medical Decision  Making/ A&P                                 Medical Decision Making Amount and/or Complexity of Data Reviewed Labs: ordered. Radiology: ordered.  Risk Prescription drug management. Decision regarding hospitalization.    Kelli Webster is a 77 y.o. female with a past medical history  significant for GERD, CHF, and fibrillation with multiple cardioversions in the past presents from cardiology office for fatigue, exertional shortness of breath, some nausea, and tachycardia.  Patient reports that over the weekend she has not had her heart rate go into the 180s at times where it is now.  She reports that this feels similar when she has had a flutter with RVR in the past.  She went to go see cardiology today and was sent here emergently for evaluation.  She is denying actual chest pain now but does have some chest tightness and shortness of breath.  She denies any fevers, chills, congestion, or cough.  Denies any constipation, diarrhea, or urinary changes.  Has some nausea but no vomiting reported.  No trauma.  Patient resting comfortably and mentating well.  She says this has happened in the past.  She does say she has had more nausea so has not been eating or drinking much over the last few days and think she could be slightly dehydrated although she does have a low EF.  On exam, lungs were clear.  Chest is nontender.  Abdomen nontender.  Patient is good pulses no extremities.  Patient has some edema in her legs.  Intact pulses on exam.  Heart rate is in the 170s and 180s and appears to be either SVT versus flutter with RVR.  EKG did not show STEMI however.  We tried a vagal number without success or any change in the speed.  Blood pressure remains between 90-100 systolic now.  Will get screening labs and talk to cardiology to make a plan.   1:07 PM Spoke to cards Master who will have cardiology team come evaluate patient.  Blood pressure is around 90 but she is mentating well and in no acute  distress.  Heart rate remains in the 170s to 180s and what appears to be in a flutter pattern.  A vagal maneuver was unsuccessful in helping and I have less suspicion for SVT at this time.  I spoke to cardiology who tried cardioversion with adenosine if it was SVT with both 6 and 12.  It was unsuccessful.  They then requested sedation for electrocardioversion.  Patient was sedated by me and cardioverted by cardiology.  They will admit for further management for AKI hypotension and bradycardia after cardioversion.         Final Clinical Impression(s) / ED Diagnoses Final diagnoses:  Tachycardia  Palpitations  Hypotension, unspecified hypotension type     Clinical Impression: 1. Tachycardia   2. Palpitations   3. Hypotension, unspecified hypotension type     Disposition: Admit  This note was prepared with assistance of Dragon voice recognition software. Occasional wrong-word or sound-a-like substitutions may have occurred due to the inherent limitations of voice recognition software.     Nylen Creque, Canary Brim, MD 09/18/23 [phone]

## 2023-09-18 NOTE — ED Notes (Signed)
Spoke with lab able to add on TSH, Magnesiun, BNP, and Hepatic function test.

## 2023-09-18 NOTE — ED Provider Triage Note (Signed)
Emergency Medicine Provider Triage Evaluation Note  Jodie Cavey , a 77 y.o. female  was evaluated in triage.  Pt complains of palpitations, nausea.  Patient states for the past 2 to 3 days has felt intermittent palpitations as well as feelings of nausea.Micah Flesher to cardiologist this morning and found to be hypotensive as well as with heart rate in the 170s prompting visit to the emergency department.  Patient with known history of A-fib on Eliquis.  States that she feels a little more short of breath than usual.  Denies any fever, chest pain.  Review of Systems  Positive: See above Negative:   Physical Exam  BP 92/74   Pulse (!) 171   Temp 98.8 F (37.1 C) (Oral)   Resp 20   Ht 5' 3.5" (1.613 m)   Wt 113.5 kg   SpO2 95%   BMI 43.63 kg/m  Gen:   Awake, no distress   Resp:  Normal effort  MSK:   Moves extremities without difficulty  Other:  Patient tachycardia with heart rate in the 170s.  1-2+ lower extremity edema.  Rales bilateral lung fields.  Medical Decision Making  Medically screening exam initiated at 12:26 PM.  Appropriate orders placed.  Lilyanah Celestin was informed that the remainder of the evaluation will be completed by another provider, this initial triage assessment does not replace that evaluation, and the importance of remaining in the ED until their evaluation is complete.  Room requested   Peter Garter, [place] 09/18/23 1227

## 2023-09-18 NOTE — H&P (Addendum)
Cardiology Admission History and Physical   Patient ID: Flordia Webster MRN: [id number]; DOB: Sep 29, 1946   Admission date: 09/18/2023  PCP:  Barbie Banner, MD   [facility] Providers Cardiologist:  None  Advanced Heart Failure:  [name], MD       Chief Complaint:  atrial flutter with RVR  Patient Profile:   Kelli Webster is a 77 y.o. female with PMH paroxysmal atrial fibrillation/flutter, OSA not on CPAP, cardiomyopathy with EF 40%, chronic systolic and diastolic heart failure who is being seen 09/18/2023 for the evaluation of atrial flutter with RVR.  History of Present Illness:   [name] noted that over the weekend, she has had fluctuating heart rates. She has had some rates as high as 170 bpm but has also seen rates around 58 bpm on her pulse ox. She was instructed to come to the HF office for an ECG, which showed regular narrow complex tachycardia at 178 BP. She was instructed to come to the ER for further evaluation. She was noted to be hypotensive, with blood pressure as low as 78/57. She was otherwise feeling well and was talking throughout the interview.  In the ER, she maintained a rapid regular rhythm in the 180-190 bpm range. I attempted vagal maneuvers without any change in her heart rate. We attempted 6 mg and 12 mg adenosine without any change in her heart rate. Given this, we proceeded with cardioversion after informed consent. She converted with one shock at 150 J to sinus bradycardia in the 35-40 range with brief junctional rhythm initially.  Her labs are notable for K 5.2 and Cr 3.09 (prior 1.78).   Past Medical History:  Diagnosis Date   Chronic systolic heart failure (HCC)    a. ECHO (09/2013) EF 20-25% diff HK, mild/mod MR, LA mod dil, RV mildly dil, RA mildly dil b. R/LHC (09/26/13) AO 133/88, LV 137/17, Lmain no dz, LAD 30% prox, LCX mild luminal irreg; RCA mild luminal irreg   GERD (gastroesophageal reflux disease)    a. 09/2013 endoscopy nl    Morbid obesity (HCC)    Osteoarthritis    Persistent atrial fibrillation (HCC) Chads2vascscore of at least 4(02/12/15)   a. Dx 09/2013 b. s/p PVI by Dr Johney Frame 11-26-2013    Past Surgical History:  Procedure Laterality Date   ABLATION  11-26-2013   PVI by Dr Johney Frame   arthroscopy       left knee     ATRIAL FIBRILLATION ABLATION N/A 11/26/2013   Procedure: ATRIAL FIBRILLATION ABLATION;  Surgeon: Gardiner Rhyme, MD;  [facility] CATH LAB;  Service: Cardiovascular;  Laterality: N/A;   ATRIAL FIBRILLATION ABLATION N/A 02/18/2021   Procedure: ATRIAL FIBRILLATION ABLATION;  Surgeon: Hillis Range, MD;  Location: [facility] INVASIVE CV LAB;  Service: Cardiovascular;  Laterality: N/A;   CARDIOVERSION N/A 10/01/2013   Procedure: CARDIOVERSION;  Surgeon: Laurey Morale, MD;  Location: [facility] ENDOSCOPY;  Service: Cardiovascular;  Laterality: N/A;   CARDIOVERSION N/A 10/17/2013   Procedure: CARDIOVERSION;  Surgeon: Laurey Morale, MD;  Location: [facility] ENDOSCOPY;  Service: Cardiovascular;  Laterality: N/A;   CARDIOVERSION N/A 02/20/2019   Procedure: CARDIOVERSION;  Surgeon: Laurey Morale, MD;  Location: [facility] ENDOSCOPY;  Service: Cardiovascular;  Laterality: N/A;   CARDIOVERSION N/A 11/19/2020   Procedure: CARDIOVERSION;  Surgeon: Laurey Morale, MD;  Location: [facility] ENDOSCOPY;  Service: Cardiovascular;  Laterality: N/A;   CARDIOVERSION N/A 12/25/2020   Procedure: CARDIOVERSION;  Surgeon: Laurey Morale, MD;  Location: [facility] ENDOSCOPY;  Service: Cardiovascular;  Laterality: N/A;   CARDIOVERSION N/A 11/17/2021   Procedure: CARDIOVERSION;  Surgeon: Laurey Morale, MD;  Location: [facility] ENDOSCOPY;  Service: Cardiovascular;  Laterality: N/A;   ESOPHAGOGASTRODUODENOSCOPY N/A 09/25/2013   Procedure: ESOPHAGOGASTRODUODENOSCOPY (EGD);  Surgeon: Graylin Shiver, MD;  Location: [facility] ENDOSCOPY;  Service: Endoscopy;  Laterality: N/A;   fx left wrist     open reduction & internal fixaction   LEFT HEART CATHETERIZATION WITH CORONARY ANGIOGRAM   09/26/2013   Procedure: LEFT HEART CATHETERIZATION WITH CORONARY ANGIOGRAM;  Surgeon: Laurey Morale, MD;  Location: [facility] CATH LAB;  Service: Cardiovascular;;   TEE WITHOUT CARDIOVERSION N/A 10/01/2013   Procedure: TRANSESOPHAGEAL ECHOCARDIOGRAM (TEE);  Surgeon: Laurey Morale, MD;  Location[facility] ENDOSCOPY;  Service: Cardiovascular;  Laterality: N/A;   TEE WITHOUT CARDIOVERSION N/A 11/26/2013   Procedure: TRANSESOPHAGEAL ECHOCARDIOGRAM (TEE);  Surgeon: Laurey Morale, MD;  Location: [facility] ENDOSCOPY;  Service: Cardiovascular;  Laterality: N/A;     Medications Prior to Admission: Prior to Admission medications   Medication Sig Start Date End Date Taking? Authorizing Provider  acetaminophen (TYLENOL) 325 MG tablet Take 325-650 mg by mouth every 6 (six) hours as needed for moderate pain or headache.    [provider]  amiodarone (PACERONE) 200 MG tablet TAKE 1 TABLET BY MOUTH EVERY DAY Patient taking differently: Take 100 mg by mouth daily. 01/30/23   Laurey Morale, MD  carvedilol (COREG) 6.25 MG tablet Take 1 tablet (6.25 mg total) by mouth 2 (two) times daily with a meal. 08/24/23   Laurey Morale, MD  dapagliflozin propanediol (FARXIGA) 10 MG TABS tablet TAKE 1 TABLET BY MOUTH EVERY DAY BEFORE BREAKFAST 04/24/23   Laurey Morale, MD  ELIQUIS 5 MG TABS tablet TAKE 1 TABLET BY MOUTH TWICE  DAILY 04/20/23   Laurey Morale, MD  famotidine (PEPCID) 20 MG tablet Take 20 mg by mouth daily as needed for heartburn or indigestion.    [provider]  lactobacillus acidophilus (BACID) TABS tablet Take 1 tablet by mouth daily in the afternoon.    [provider]  losartan (COZAAR) 25 MG tablet TAKE 1 TABLET BY MOUTH TWICE A DAY 08/31/23   Laurey Morale, MD  spironolactone (ALDACTONE) 25 MG tablet Take 0.5 tablets (12.5 mg total) by mouth daily. 07/18/23 10/16/23  Jacklynn Ganong, FNP     Allergies:   No Known Allergies  Social History:   Social History   Socioeconomic  History   Marital status: Married    Spouse name: Not on file   Number of children: Not on file   Years of education: Not on file   Highest education level: Not on file  Occupational History   Not on file  Tobacco Use   Smoking status: Never   Smokeless tobacco: Never  Vaping Use   Vaping status: Never Used  Substance and Sexual Activity   Alcohol use: No   Drug use: No   Sexual activity: Yes    Birth control/protection: Post-menopausal  Other Topics Concern   Not on file  Social History Narrative   Lives in [place] with husband.  Does not routinely exercise.   Realtor with [employer]   Social Determinants of Health   Financial Resource Strain: Not on file  Food Insecurity: No Food Insecurity (09/18/2023)   Hunger Vital Sign    Worried About Running Out of Food in the Last Year: Never true    Ran Out of Food in the Last Year:  Never true  Transportation Needs: No Transportation Needs (09/18/2023)   PRAPARE - Administrator, Civil Service (Medical): No    Lack of Transportation (Non-Medical): No  Physical Activity: Not on file  Stress: Not on file  Social Connections: Not on file  Intimate Partner Violence: Not At Risk (09/18/2023)   Humiliation, Afraid, Rape, and Kick questionnaire    Fear of Current or Ex-Partner: No    Emotionally Abused: No    Physically Abused: No    Sexually Abused: No    Family History:   The patient's family history includes Healthy in her brother, mother, sister, and sister; Heart attack in her father.    ROS:  Please see the history of present illness.  All other ROS reviewed and negative.     Physical Exam/Data:   Vitals:   09/18/23 1436 09/18/23 1440 09/18/23 1445 09/18/23 1500  BP: (!) 92/52 (!) 90/51 (!) 95/51 (!) 105/57  Pulse: (!) 36 (!) 38 (!) 38 (!) 40  Resp: 17 17 (!) 21 15  Temp:      TempSrc:      SpO2: 100% 100% 100% 100%  Weight:      Height:       No intake or output data in the 24 hours ending 09/18/23  1527    09/18/2023   12:19 PM 09/18/2023   11:38 AM 07/18/2023    1:25 PM  Last 3 Weights  Weight (lbs) 250 lb 3.6 oz 250 lb 3.2 oz 249 lb 3.2 oz  Weight (kg) 113.5 kg 113.49 kg 113.036 kg     Body mass index is 43.63 kg/m.  General:  Well nourished, well developed, in no acute distress HEENT: normal Neck: no apparent JVD Vascular: No carotid bruits; Distal pulses 2+ bilaterally   Cardiac:  Post cardioversion, normal S1, S2; bradycardic; difficult to auscultate murmur 2/2 body habitus Lungs:  clear to auscultation bilaterally, no wheezing, rhonchi or rales  Abd: soft, nontender, no hepatomegaly  Ext: no edema Musculoskeletal:  No deformities, BUE and BLE strength normal and equal Skin: warm and dry  Neuro:  CNs 2-12 intact, no focal abnormalities noted Psych:  Normal affect    EKG:  The ECG that was done was personally reviewed and demonstrates initially atrial flutter with RVR, then sinus bradycardia post cardioversion  Relevant CV Studies: No new  Laboratory Data:  High Sensitivity Troponin:  No results for input(s): "TROPONINIHS" in the last 720 hours.    Chemistry Recent Labs  Lab 09/18/23 1232 09/18/23 1446  NA 134*  --   K 5.2*  --   CL 102  --   CO2 19*  --   GLUCOSE 199*  --   BUN 50*  --   CREATININE 3.09*  --   CALCIUM 10.6*  --   MG  --  2.1  GFRNONAA 15*  --   ANIONGAP 13  --     Recent Labs  Lab 09/18/23 1446  PROT 5.1*  ALBUMIN 2.7*  AST 234*  ALT 203*  ALKPHOS 50  BILITOT 0.6   Lipids No results for input(s): "CHOL", "TRIG", "HDL", "LABVLDL", "LDLCALC", "CHOLHDL" in the last 168 hours. Hematology Recent Labs  Lab 09/18/23 1232  WBC 12.0*  RBC 4.25  HGB 10.9*  HCT 37.0  MCV 87.1  MCH 25.6*  MCHC 29.5*  RDW 21.1*  PLT 550*   Thyroid No results for input(s): "TSH", "FREET4" in the last 168 hours. BNPNo results for input(s): "BNP", "PROBNP"  in the last 168 hours.  DDimer No results for input(s): "DDIMER" in the last 168  hours.   Radiology/Studies:  No results found.   Assessment and Plan:   Atrial flutter with RVR -now in sinus bradycardia post cardioversion -continue amiodarone -CHA2DS2/VAS Stroke Risk Points=4, continue apixaban, no missed doses. Her Cr is typically normal. With high thromboembolic risk with cardioversion, will continue 5 mg BID dosing for now, monitor Cr closely. I expected Cr to improve now that she is not extremely tachycardic, and I do not want her to be underdosed. However, if her Cr does not improve, will need to consider alternative. -hold carvedilol given hypotension, bradycardia  Hypotension Acute kidney injury Cardiomyopathy, chronic systolic and diastolic heart failure -hold losartan, spironolactone -500 cc gentle fluids as she appears euvolemic -monitor Cr closely   Risk Assessment/Risk Scores:         CHA2DS2-VASc Score =     This indicates a  % annual risk of stroke. The patient's score is based upon:       Code Status: Full Code  Severity of Illness: The appropriate patient status for this patient is OBSERVATION. Observation status is judged to be reasonable and necessary in order to provide the required intensity of service to ensure the patient's safety. The patient's presenting symptoms, physical exam findings, and initial radiographic and laboratory data in the context of their medical condition is felt to place them at decreased risk for further clinical deterioration. Furthermore, it is anticipated that the patient will be medically stable for discharge from the hospital within 2 midnights of admission.    For questions or updates, please contact [facility] Please consult [email or web address] for contact info under     Signed, Jodelle Red, MD  09/18/2023 3:27 PM

## 2023-09-18 NOTE — Sedation Documentation (Signed)
Dr Cristal Deer cardioversion at 150 j synched.

## 2023-09-18 NOTE — ED Triage Notes (Signed)
Pt sent by cardiologist for hr of 180, hypotensive 79/58 in the office today. Pt c/o SOB started yesterday. Pt denies chest pain

## 2023-09-18 NOTE — ED Notes (Signed)
Md notified of blood pressure. No new orders at this time.

## 2023-09-18 NOTE — ED Notes (Signed)
ED TO INPATIENT HANDOFF REPORT  ED Nurse Name and Phone #: cat [phone]  S Name/Age/Gender Kelli Webster 77 y.o. female Room/Bed: 005C/005C  Code Status   Code Status: Prior  Home/SNF/Other Home Patient oriented to: self, place, time, and situation Is this baseline? Yes   Triage Complete: Triage complete  Chief Complaint Atrial flutter with rapid ventricular response [facility]) [I48.92]  Triage Note Pt sent by cardiologist for hr of 180, hypotensive 79/58 in the office today. Pt c/o SOB started yesterday. Pt denies chest pain   Allergies No Known Allergies  Level of Care/Admitting Diagnosis ED Disposition     ED Disposition  Admit   Condition  --   Comment  Hospital Area: [facility] [[id number]]  Level of Care: Telemetry Cardiac [[id number]]  May place patient in observation at [facility] or [facility] if equivalent level of care is available:: No  Covid Evaluation: Asymptomatic - no recent exposure (last 10 days) testing not required  Diagnosis: Atrial flutter with rapid ventricular response [facility]) [[id number]]  Admitting Physician: Jodelle Red [[id number]]  Attending Physician: Jodelle Red [[id number]]          B Medical/Surgery History Past Medical History:  Diagnosis Date   Chronic systolic heart failure (HCC)    a. ECHO (09/2013) EF 20-25% diff HK, mild/mod MR, LA mod dil, RV mildly dil, RA mildly dil b. R/LHC (09/26/13) AO 133/88, LV 137/17, Lmain no dz, LAD 30% prox, LCX mild luminal irreg; RCA mild luminal irreg   GERD (gastroesophageal reflux disease)    a. 09/2013 endoscopy nl   Morbid obesity (HCC)    Osteoarthritis    Persistent atrial fibrillation (HCC) Chads2vascscore of at least 4(02/12/15)   a. Dx 09/2013 b. s/p PVI by Dr Johney Frame 11-26-2013   Past Surgical History:  Procedure Laterality Date   ABLATION  11-26-2013   PVI by Dr Johney Frame   arthroscopy       left knee     ATRIAL FIBRILLATION ABLATION N/A 11/26/2013   Procedure:  ATRIAL FIBRILLATION ABLATION;  Surgeon: Gardiner Rhyme, MD;  [facility]: [facility] CATH LAB;  Service: Cardiovascular;  Laterality: N/A;   ATRIAL FIBRILLATION ABLATION N/A 02/18/2021   Procedure: ATRIAL FIBRILLATION ABLATION;  Surgeon: Hillis Range, MD;  [facility] INVASIVE CV LAB;  Service: Cardiovascular;  Laterality: N/A;   CARDIOVERSION N/A 10/01/2013   Procedure: CARDIOVERSION;  Surgeon: Laurey Morale, MD;  Location: [facility] ENDOSCOPY;  Service: Cardiovascular;  Laterality: N/A;   CARDIOVERSION N/A 10/17/2013   Procedure: CARDIOVERSION;  Surgeon: Laurey Morale, MD;  Location: [facility] ENDOSCOPY;  Service: Cardiovascular;  Laterality: N/A;   CARDIOVERSION N/A 02/20/2019   Procedure: CARDIOVERSION;  Surgeon: Laurey Morale, MD;  Location: [facility] ENDOSCOPY;  Service: Cardiovascular;  Laterality: N/A;   CARDIOVERSION N/A 11/19/2020   Procedure: CARDIOVERSION;  Surgeon: Laurey Morale, MD;  Location: [facility] ENDOSCOPY;  Service: Cardiovascular;  Laterality: N/A;   CARDIOVERSION N/A 12/25/2020   Procedure: CARDIOVERSION;  Surgeon: Laurey Morale, MD;  Location: [facility] ENDOSCOPY;  Service: Cardiovascular;  Laterality: N/A;   CARDIOVERSION N/A 11/17/2021   Procedure: CARDIOVERSION;  Surgeon: Laurey Morale, MD;  Location: [facility] ENDOSCOPY;  Service: Cardiovascular;  Laterality: N/A;   ESOPHAGOGASTRODUODENOSCOPY N/A 09/25/2013   Procedure: ESOPHAGOGASTRODUODENOSCOPY (EGD);  Surgeon: Graylin Shiver, MD;  Location: [facility] ENDOSCOPY;  Service: Endoscopy;  Laterality: N/A;   fx left wrist     open reduction & internal fixaction   LEFT HEART CATHETERIZATION WITH CORONARY ANGIOGRAM  09/26/2013   Procedure:  LEFT HEART CATHETERIZATION WITH CORONARY ANGIOGRAM;  Surgeon: Laurey Morale, MD;  Location: [facility] CATH LAB;  Service: Cardiovascular;;   TEE WITHOUT CARDIOVERSION N/A 10/01/2013   Procedure: TRANSESOPHAGEAL ECHOCARDIOGRAM (TEE);  Surgeon: Laurey Morale, MD;  Location: [facility] ENDOSCOPY;  Service: Cardiovascular;  Laterality: N/A;   TEE  WITHOUT CARDIOVERSION N/A 11/26/2013   Procedure: TRANSESOPHAGEAL ECHOCARDIOGRAM (TEE);  Surgeon: Laurey Morale, MD;  Location: [facility] ENDOSCOPY;  Service: Cardiovascular;  Laterality: N/A;     A IV Location/Drains/Wounds Patient Lines/Drains/Airways Status     Active Line/Drains/Airways     Name Placement date Placement time Site Days   Peripheral IV 09/18/23 18 G 1" Right Antecubital 09/18/23  1252  Antecubital  less than 1            Intake/Output Last 24 hours No intake or output data in the 24 hours ending 09/18/23 1802  Labs/Imaging Results for orders placed or performed during the hospital encounter of 09/18/23 (from the past 48 hour(s))  Brain natriuretic peptide     Status: Abnormal   Collection Time: 09/18/23 12:31 PM  Result Value Ref Range   B Natriuretic Peptide 2,317.5 (H) 0.0 - 100.0 pg/mL    Comment: Performed at [facility] Lab, [address], [place], [place] [address]  Basic metabolic panel     Status: Abnormal   Collection Time: 09/18/23 12:32 PM  Result Value Ref Range   Sodium 134 (L) 135 - 145 mmol/L   Potassium 5.2 (H) 3.5 - 5.1 mmol/L   Chloride 102 98 - 111 mmol/L   CO2 19 (L) 22 - 32 mmol/L   Glucose, Bld 199 (H) 70 - 99 mg/dL    Comment: Glucose reference range applies only to samples taken after fasting for at least 8 hours.   BUN 50 (H) 8 - 23 mg/dL   Creatinine, Ser 6.04 (H) 0.44 - 1.00 mg/dL   Calcium 54.0 (H) 8.9 - 10.3 mg/dL   GFR, Estimated 15 (L) >60 mL/min    Comment: (NOTE) Calculated using the CKD-EPI Creatinine Equation (2021)    Anion gap 13 5 - 15    Comment: Performed at [facility] Lab, [address] [place], [place] [address]  CBC     Status: Abnormal   Collection Time: 09/18/23 12:32 PM  Result Value Ref Range   WBC 12.0 (H) 4.0 - 10.5 K/uL   RBC 4.25 3.87 - 5.11 MIL/uL   Hemoglobin 10.9 (L) 12.0 - 15.0 g/dL   HCT 14.7 82.9 - 56.2 %   MCV 87.1 80.0 - 100.0 fL   MCH 25.6 (L) 26.0 - 34.0 pg   MCHC 29.5 (L) 30.0  - 36.0 g/dL   RDW 13.0 (H) 86.5 - 78.4 %   Platelets 550 (H) 150 - 400 K/uL   nRBC 0.0 0.0 - 0.2 %    Comment: Performed at [facility], [address], [place], [place] [address]  Hepatic function panel     Status: Abnormal   Collection Time: 09/18/23  2:46 PM  Result Value Ref Range   Total Protein 5.1 (L) 6.5 - 8.1 g/dL   Albumin 2.7 (L) 3.5 - 5.0 g/dL   AST 528 (H) 15 - 41 U/L   ALT 203 (H) 0 - 44 U/L   Alkaline Phosphatase 50 38 - 126 U/L   Total Bilirubin 0.6 0.3 - 1.2 mg/dL   Bilirubin, Direct 0.1 0.0 - 0.2 mg/dL   Indirect Bilirubin 0.5 0.3 - 0.9  mg/dL    Comment: Performed at [facility] Lab, [address], [place], [place] [address]  TSH     Status: None   Collection Time: 09/18/23  2:46 PM  Result Value Ref Range   TSH 2.519 0.350 - 4.500 uIU/mL    Comment: Performed by a 3rd Generation assay with a functional sensitivity of <=0.01 uIU/mL. Performed at [facility] Lab, [address], [place], [place] [address]   Magnesium     Status: None   Collection Time: 09/18/23  2:46 PM  Result Value Ref Range   Magnesium 2.1 1.7 - 2.4 mg/dL    Comment: Performed at [facility], [address] [place], [place] [address]  Troponin I (High Sensitivity)     Status: Abnormal   Collection Time: 09/18/23  2:46 PM  Result Value Ref Range   Troponin I (High Sensitivity) 488 (HH) <18 ng/L    Comment: CRITICAL RESULT CALLED TO, READ BACK BY AND VERIFIED WITH C Luiz Ochoa 1621 09/18/2023 WBOND (NOTE) Elevated high sensitivity troponin I (hsTnI) values and significant  changes across serial measurements may suggest ACS but many other  chronic and acute conditions are known to elevate hsTnI results.  Refer to the "Links" section for chest pain algorithms and additional  guidance. Performed at [facility], [address] [place], [place] [address]    DG Chest Portable 1 View  Result Date: 09/18/2023 CLINICAL DATA:  Chest pain EXAM: PORTABLE CHEST 1 VIEW  COMPARISON:  09/23/2013. FINDINGS: Cardiomegaly. Both lungs are clear. The visualized skeletal structures are unremarkable. IMPRESSION: Cardiomegaly without acute abnormality of the lungs in AP portable projection. Electronically Signed   By: Jearld Lesch M.D.   On: 09/18/2023 16:29    Pending Labs Unresulted Labs (From admission, onward)     Start     Ordered   09/19/23 0500  Basic metabolic panel  Tomorrow morning,   R        09/18/23 1457   09/19/23 0500  CBC  Tomorrow morning,   R        09/18/23 1457            Vitals/Pain Today's Vitals   09/18/23 1445 09/18/23 1500 09/18/23 1530 09/18/23 1600  BP: (!) 95/51 (!) 105/57 109/63   Pulse: (!) 38 (!) 40 (!) 40   Resp: (!) 21 15 20    Temp:    98.8 F (37.1 C)  TempSrc:      SpO2: 100% 100% 100%   Weight:      Height:      PainSc:        Isolation Precautions No active isolations  Medications Medications  amiodarone (PACERONE) tablet 100 mg (100 mg Oral Not Given 09/18/23 1536)  apixaban (ELIQUIS) tablet 5 mg (has no administration in time range)  adenosine (ADENOCARD) 6 MG/2ML injection 6 mg (6 mg Intravenous Given 09/18/23 1413)  adenosine (ADENOCARD) 6 MG/2ML injection 12 mg (12 mg Intravenous Given 09/18/23 1416)  propofol (DIPRIVAN) 10 mg/mL bolus/IV push 56.8 mg (50 mg Intravenous Given 09/18/23 1425)    Mobility walks with person assist     Focused Assessments Cardiac Assessment Handoff:  Cardiac Rhythm: Sinus bradycardia Lab Results  Component Value Date   TROPONINI <0.30 09/24/2013   No results found for: "DDIMER" Does the Patient currently have chest pain? No    R Recommendations: See Admitting Provider Note  Report given to:   Additional Notes:

## 2023-09-19 ENCOUNTER — Telehealth: Payer: Self-pay | Admitting: Cardiology

## 2023-09-19 DIAGNOSIS — I4892 Unspecified atrial flutter: Secondary | ICD-10-CM | POA: Diagnosis not present

## 2023-09-19 DIAGNOSIS — I959 Hypotension, unspecified: Secondary | ICD-10-CM

## 2023-09-19 DIAGNOSIS — I5022 Chronic systolic (congestive) heart failure: Secondary | ICD-10-CM | POA: Diagnosis not present

## 2023-09-19 DIAGNOSIS — N189 Chronic kidney disease, unspecified: Secondary | ICD-10-CM

## 2023-09-19 LAB — BASIC METABOLIC PANEL
Anion gap: 9 (ref 5–15)
BUN: 55 mg/dL — ABNORMAL HIGH (ref 8–23)
CO2: 24 mmol/L (ref 22–32)
Calcium: 10.1 mg/dL (ref 8.9–10.3)
Chloride: 105 mmol/L (ref 98–111)
Creatinine, Ser: 3.08 mg/dL — ABNORMAL HIGH (ref 0.44–1.00)
GFR, Estimated: 15 mL/min — ABNORMAL LOW (ref >60)
Glucose, Bld: 92 mg/dL (ref 70–99)
Potassium: 4.4 mmol/L (ref 3.5–5.1)
Sodium: 138 mmol/L (ref 135–145)

## 2023-09-19 LAB — CBC
HCT: 32.7 % — ABNORMAL LOW (ref 36.0–46.0)
Hemoglobin: 9.7 g/dL — ABNORMAL LOW (ref 12.0–15.0)
MCH: 25.8 pg — ABNORMAL LOW (ref 26.0–34.0)
MCHC: 29.7 g/dL — ABNORMAL LOW (ref 30.0–36.0)
MCV: 87 fL (ref 80.0–100.0)
Platelets: 349 10*3/uL (ref 150–400)
RBC: 3.76 MIL/uL — ABNORMAL LOW (ref 3.87–5.11)
RDW: 20.4 % — ABNORMAL HIGH (ref 11.5–15.5)
WBC: 10.5 10*3/uL (ref 4.0–10.5)
nRBC: 0.2 % (ref 0.0–0.2)

## 2023-09-19 MED ORDER — AMIODARONE HCL 200 MG PO TABS: 200.0000 mg | ORAL_TABLET | Freq: Every day | ORAL | 6 refills | Status: DC

## 2023-09-19 MED ORDER — AMIODARONE HCL 200 MG PO TABS: 100.0000 mg | ORAL_TABLET | Freq: Every day | ORAL | 6 refills | Status: DC

## 2023-09-19 NOTE — ED Notes (Signed)
ED TO INPATIENT HANDOFF REPORT  ED Nurse Name and Phone #: [phone]  S Name/Age/Gender Sharen Counter 77 y.o. female Room/Bed: 005C/005C  Code Status   Code Status: Prior  Home/SNF/Other Home Patient oriented to: self, place, time, and situation Is this baseline? Yes   Triage Complete: Triage complete  Chief Complaint Atrial flutter with rapid ventricular response [facility]) [I48.92]  Triage Note Pt sent by cardiologist for hr of 180, hypotensive 79/58 in the office today. Pt c/o SOB started yesterday. Pt denies chest pain   Allergies No Known Allergies  Level of Care/Admitting Diagnosis ED Disposition     ED Disposition  Admit   Condition  --   Comment  Hospital Area: [facility] [[id number]]  Level of Care: Telemetry Cardiac [[id number]]  May place patient in observation at [facility] or [facility] if equivalent level of care is available:: No  Covid Evaluation: Asymptomatic - no recent exposure (last 10 days) testing not required  Diagnosis: Atrial flutter with rapid ventricular response [facility]) [[id number]]  Admitting Physician: Jodelle Red [[id number]]  Attending Physician: Jodelle Red [[id number]]          B Medical/Surgery History Past Medical History:  Diagnosis Date   Chronic systolic heart failure (HCC)    a. ECHO (09/2013) EF 20-25% diff HK, mild/mod MR, LA mod dil, RV mildly dil, RA mildly dil b. R/LHC (09/26/13) AO 133/88, LV 137/17, Lmain no dz, LAD 30% prox, LCX mild luminal irreg; RCA mild luminal irreg   GERD (gastroesophageal reflux disease)    a. 09/2013 endoscopy nl   Morbid obesity (HCC)    Osteoarthritis    Persistent atrial fibrillation (HCC) Chads2vascscore of at least 4(02/12/15)   a. Dx 09/2013 b. s/p PVI by Dr Johney Frame 11-26-2013   Past Surgical History:  Procedure Laterality Date   ABLATION  11-26-2013   PVI by Dr Johney Frame   arthroscopy       left knee     ATRIAL FIBRILLATION ABLATION N/A 11/26/2013   Procedure: ATRIAL  FIBRILLATION ABLATION;  Surgeon: Gardiner Rhyme, MD;  Location: [facility] CATH LAB;  Service: Cardiovascular;  Laterality: N/A;   ATRIAL FIBRILLATION ABLATION N/A 02/18/2021   Procedure: ATRIAL FIBRILLATION ABLATION;  Surgeon: Hillis Range, MD;  Location: [facility] INVASIVE CV LAB;  Service: Cardiovascular;  Laterality: N/A;   CARDIOVERSION N/A 10/01/2013   Procedure: CARDIOVERSION;  Surgeon: Laurey Morale, MD;  Location: [facility] ENDOSCOPY;  Service: Cardiovascular;  Laterality: N/A;   CARDIOVERSION N/A 10/17/2013   Procedure: CARDIOVERSION;  Surgeon: Laurey Morale, MD;  Location: [facility] ENDOSCOPY;  Service: Cardiovascular;  Laterality: N/A;   CARDIOVERSION N/A 02/20/2019   Procedure: CARDIOVERSION;  Surgeon: Laurey Morale, MD;  Location: [facility] ENDOSCOPY;  Service: Cardiovascular;  Laterality: N/A;   CARDIOVERSION N/A 11/19/2020   Procedure: CARDIOVERSION;  Surgeon: Laurey Morale, MD;  Location: [facility] ENDOSCOPY;  Service: Cardiovascular;  Laterality: N/A;   CARDIOVERSION N/A 12/25/2020   Procedure: CARDIOVERSION;  Surgeon: Laurey Morale, MD;  Location: [facility] ENDOSCOPY;  Service: Cardiovascular;  Laterality: N/A;   CARDIOVERSION N/A 11/17/2021   Procedure: CARDIOVERSION;  Surgeon: Laurey Morale, MD;  Location: [facility] ENDOSCOPY;  Service: Cardiovascular;  Laterality: N/A;   ESOPHAGOGASTRODUODENOSCOPY N/A 09/25/2013   Procedure: ESOPHAGOGASTRODUODENOSCOPY (EGD);  Surgeon: Graylin Shiver, MD;  Location: [facility] ENDOSCOPY;  Service: Endoscopy;  Laterality: N/A;   fx left wrist     open reduction & internal fixaction   LEFT HEART CATHETERIZATION WITH CORONARY ANGIOGRAM  09/26/2013   Procedure: LEFT  HEART CATHETERIZATION WITH CORONARY ANGIOGRAM;  Surgeon: Laurey Morale, MD;  Location: [facility] CATH LAB;  Service: Cardiovascular;;   TEE WITHOUT CARDIOVERSION N/A 10/01/2013   Procedure: TRANSESOPHAGEAL ECHOCARDIOGRAM (TEE);  Surgeon: Laurey Morale, MD;  Location: [facility] ENDOSCOPY;  Service: Cardiovascular;  Laterality: N/A;   TEE WITHOUT  CARDIOVERSION N/A 11/26/2013   Procedure: TRANSESOPHAGEAL ECHOCARDIOGRAM (TEE);  Surgeon: Laurey Morale, MD;  Location: [facility] ENDOSCOPY;  Service: Cardiovascular;  Laterality: N/A;     A IV Location/Drains/Wounds Patient Lines/Drains/Airways Status     Active Line/Drains/Airways     Name Placement date Placement time Site Days   Peripheral IV 09/18/23 18 G 1" Right Antecubital 09/18/23  1252  Antecubital  1            Intake/Output Last 24 hours No intake or output data in the 24 hours ending 09/19/23 0725  Labs/Imaging Results for orders placed or performed during the hospital encounter of 09/18/23 (from the past 48 hour(s))  Brain natriuretic peptide     Status: Abnormal   Collection Time: 09/18/23 12:31 PM  Result Value Ref Range   B Natriuretic Peptide 2,317.5 (H) 0.0 - 100.0 pg/mL    Comment: Performed at [facility], [address] [place], [place] [address]  Basic metabolic panel     Status: Abnormal   Collection Time: 09/18/23 12:32 PM  Result Value Ref Range   Sodium 134 (L) 135 - 145 mmol/L   Potassium 5.2 (H) 3.5 - 5.1 mmol/L   Chloride 102 98 - 111 mmol/L   CO2 19 (L) 22 - 32 mmol/L   Glucose, Bld 199 (H) 70 - 99 mg/dL    Comment: Glucose reference range applies only to samples taken after fasting for at least 8 hours.   BUN 50 (H) 8 - 23 mg/dL   Creatinine, Ser 1.30 (H) 0.44 - 1.00 mg/dL   Calcium 86.5 (H) 8.9 - 10.3 mg/dL   GFR, Estimated 15 (L) >60 mL/min    Comment: (NOTE) Calculated using the CKD-EPI Creatinine Equation (2021)    Anion gap 13 5 - 15    Comment: Performed at [facility], [address] [place], [place] [address]  CBC     Status: Abnormal   Collection Time: 09/18/23 12:32 PM  Result Value Ref Range   WBC 12.0 (H) 4.0 - 10.5 K/uL   RBC 4.25 3.87 - 5.11 MIL/uL   Hemoglobin 10.9 (L) 12.0 - 15.0 g/dL   HCT 62.9 52.8 - 41.3 %   MCV 87.1 80.0 - 100.0 fL   MCH 25.6 (L) 26.0 - 34.0 pg   MCHC 29.5 (L) 30.0 - 36.0 g/dL   RDW  24.4 (H) 01.0 - 15.5 %   Platelets 550 (H) 150 - 400 K/uL   nRBC 0.0 0.0 - 0.2 %    Comment: Performed at [facility], [address], [place], [place] [address]  Hepatic function panel     Status: Abnormal   Collection Time: 09/18/23  2:46 PM  Result Value Ref Range   Total Protein 5.1 (L) 6.5 - 8.1 g/dL   Albumin 2.7 (L) 3.5 - 5.0 g/dL   AST 664 (H) 15 - 41 U/L   ALT 203 (H) 0 - 44 U/L   Alkaline Phosphatase 50 38 - 126 U/L   Total Bilirubin 0.6 0.3 - 1.2 mg/dL   Bilirubin, Direct 0.1 0.0 - 0.2 mg/dL   Indirect Bilirubin 0.5 0.3 - 0.9 mg/dL  Comment: Performed at [facility], [address] [place], [place] [address]  TSH     Status: None   Collection Time: 09/18/23  2:46 PM  Result Value Ref Range   TSH 2.519 0.350 - 4.500 uIU/mL    Comment: Performed by a 3rd Generation assay with a functional sensitivity of <=0.01 uIU/mL. Performed at [facility], [address] [place], [place] [address]   Magnesium     Status: None   Collection Time: 09/18/23  2:46 PM  Result Value Ref Range   Magnesium 2.1 1.7 - 2.4 mg/dL    Comment: Performed at [facility], [address] [place], [place] [address]  Troponin I (High Sensitivity)     Status: Abnormal   Collection Time: 09/18/23  2:46 PM  Result Value Ref Range   Troponin I (High Sensitivity) 488 (HH) <18 ng/L    Comment: CRITICAL RESULT CALLED TO, READ BACK BY AND VERIFIED WITH C Luiz Ochoa 1621 09/18/2023 WBOND (NOTE) Elevated high sensitivity troponin I (hsTnI) values and significant  changes across serial measurements may suggest ACS but many other  chronic and acute conditions are known to elevate hsTnI results.  Refer to the "Links" section for chest pain algorithms and additional  guidance. Performed at [facility], [address], [place], [place] [address]   Troponin I (High Sensitivity)     Status: Abnormal   Collection Time: 09/18/23  6:16 PM  Result Value Ref Range   Troponin I  (High Sensitivity) 960 (HH) <18 ng/L    Comment: CRITICAL RESULT CALLED TO, READ BACK BY AND VERIFIED WITH Georgie Chard RN @1911  ON 09/18/23 BY MAB MT (NOTE) Elevated high sensitivity troponin I (hsTnI) values and significant  changes across serial measurements may suggest ACS but many other  chronic and acute conditions are known to elevate hsTnI results.  Refer to the "Links" section for chest pain algorithms and additional  guidance. Performed at [facility], [address], [place], [place] [address]   Basic metabolic panel     Status: Abnormal   Collection Time: 09/19/23  2:26 AM  Result Value Ref Range   Sodium 138 135 - 145 mmol/L   Potassium 4.4 3.5 - 5.1 mmol/L   Chloride 105 98 - 111 mmol/L   CO2 24 22 - 32 mmol/L   Glucose, Bld 92 70 - 99 mg/dL    Comment: Glucose reference range applies only to samples taken after fasting for at least 8 hours.   BUN 55 (H) 8 - 23 mg/dL   Creatinine, Ser 1.30 (H) 0.44 - 1.00 mg/dL   Calcium 86.5 8.9 - 78.4 mg/dL   GFR, Estimated 15 (L) >60 mL/min    Comment: (NOTE) Calculated using the CKD-EPI Creatinine Equation (2021)    Anion gap 9 5 - 15    Comment: Performed at [facility], [address] [place], [place] [address]  CBC     Status: Abnormal   Collection Time: 09/19/23  2:26 AM  Result Value Ref Range   WBC 10.5 4.0 - 10.5 K/uL   RBC 3.76 (L) 3.87 - 5.11 MIL/uL   Hemoglobin 9.7 (L) 12.0 - 15.0 g/dL   HCT 52.8 (L) 41.3 - 24.4 %   MCV 87.0 80.0 - 100.0 fL   MCH 25.8 (L) 26.0 - 34.0 pg   MCHC 29.7 (L) 30.0 - 36.0 g/dL   RDW 01.0 (H) 27.2 - 53.6 %  Platelets 349 150 - 400 K/uL   nRBC 0.2 0.0 - 0.2 %    Comment: Performed at [facility], [address] [place], [place] [address]   DG Chest Portable 1 View  Result Date: 09/18/2023 CLINICAL DATA:  Chest pain EXAM: PORTABLE CHEST 1 VIEW COMPARISON:  09/23/2013. FINDINGS: Cardiomegaly. Both lungs are clear. The visualized skeletal structures are unremarkable.  IMPRESSION: Cardiomegaly without acute abnormality of the lungs in AP portable projection. Electronically Signed   By: Jearld Lesch M.D.   On: 09/18/2023 16:29    Pending Labs Unresulted Labs (From admission, onward)    None       Vitals/Pain Today's Vitals   09/19/23 0430 09/19/23 0500 09/19/23 0600 09/19/23 0630  BP: 116/64 (!) 119/40 (!) 114/51 (!) 115/45  Pulse: (!) 44 (!) 44 (!) 40 (!) 38  Resp: 20 15 16 15   Temp:      TempSrc:      SpO2: 100% 97% 98% 98%  Weight:      Height:      PainSc:        Isolation Precautions No active isolations  Medications Medications  amiodarone (PACERONE) tablet 100 mg (100 mg Oral Not Given 09/18/23 1536)  apixaban (ELIQUIS) tablet 5 mg (5 mg Oral Given 09/18/23 2140)  adenosine (ADENOCARD) 6 MG/2ML injection 6 mg (6 mg Intravenous Given 09/18/23 1413)  adenosine (ADENOCARD) 6 MG/2ML injection 12 mg (12 mg Intravenous Given 09/18/23 1416)  propofol (DIPRIVAN) 10 mg/mL bolus/IV push 56.8 mg (50 mg Intravenous Given 09/18/23 1425)    Mobility walks     Focused Assessments Cardiac Assessment Handoff:  Cardiac Rhythm: Sinus bradycardia Lab Results  Component Value Date   TROPONINI <0.30 09/24/2013   No results found for: "DDIMER" Does the Patient currently have chest pain? No    R Recommendations: See Admitting Provider Note  Report given to:   Additional Notes:

## 2023-09-19 NOTE — Discharge Summary (Addendum)
Discharge Summary    Patient ID: Kelli Webster MRN: [id number]; DOB: 20-Apr-1946  Admit date: 09/18/2023 Discharge date: 09/19/2023  PCP:  Barbie Banner, MD   [facility] Providers Cardiologist:  None  Advanced Heart Failure:  [name], MD  {   Discharge Diagnoses    Principal Problem:   Atrial flutter with rapid ventricular response [facility]) Active Problems:   Chronic systolic CHF (congestive heart failure) (HCC)   OSA (obstructive sleep apnea)   Morbid obesity (HCC)   CKD (chronic kidney disease)    Diagnostic Studies/Procedures    Echocardiogram 11/30/2021 1. Left ventricular ejection fraction, by estimation, is 40%. The left  ventricle has mild to moderately decreased function. The left ventricle  demonstrates global hypokinesis. The left ventricular internal cavity size  was mildly dilated. Left  ventricular diastolic parameters are consistent with Grade II diastolic  dysfunction (pseudonormalization).   2. Right ventricular systolic function is mildly reduced. The right  ventricular size is normal. There is moderately elevated pulmonary artery  systolic pressure. The estimated right ventricular systolic pressure is  54.0 mmHg.   3. Left atrial size was moderately dilated.   4. The mitral valve is abnormal. Moderate mitral valve regurgitation,  suspection functional MR with dilated LV and LA. No evidence of mitral  stenosis.   5. The aortic valve is tricuspid. There is mild calcification of the  aortic valve. Aortic valve regurgitation is mild. No aortic stenosis is  present.   6. The inferior vena cava is normal in size with greater than 50%  respiratory variability, suggesting right atrial pressure of 3 mmHg.     _____________   History of Present Illness     Kelli Webster is a 77 y.o. female with PAF, moderate OSA unable to tolerate CPAP, heart failure with mildly reduced EF.  Patient has history of HFrEF dating back to 2014 where she was  found to be in A-fib RVR and volume overloaded.  EF at the time 20 to 25%.  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  She required 2 cardioversions however unable to maintain A-fib despite being on amiodarone for the second time.  December 2014 she had ablation that was successful.  After her ablation EF went down again 25 to 30% in June 2017.  She has had some improvement since then showing generally mildly reduced.  Again had multiple cardioversions in December 2021, January 2022, March 2022 and had a redo A-fib ablation.  However this was unable to keep her out of of atypical atrial flutter.  Dr. Johney Frame had stopped her amiodarone August 2022.  November 2022 she was found atypical flutter at follow-up and was started on amiodarone had successful DCCV in November 2022.  She was last seen by advanced heart failure July 18, 2023.  PTA she has had been complaining of fluctuating heart rates as high as 170s however as low as 60s on her pulse oximeter.  She was instructed to go to heart failure office on Monday to get an EKG where it showed narrow complex tachycardia with a heart rate of 178.  She is recommended for ER evaluation.  Here she was noted to be hypotensive with blood pressures in the 70s systolics without other significant complaints.  In the ER vagal maneuvers were unsuccessful and she was given 6 mg and 12 mg of adenosine without a change in her heart rate.  She was then cardioverted on 150 J to sinus bradycardia in the 35-40  range and had brief junctional rhythm.  Potassium 5.2.  Creatinine 3.09.  Prior creatinine 1.78.  Hospital Course     Consultants:    Paroxysmal a flutter RVR  Status post cardioversion and now in sinus bradycardia. Continue amiodarone 200 mg daily and Eliquis 5 mg twice daily.  Has been compliant with this without any missed doses.  Currently has renal dysfunction but does not meet other 2 criteria for reduced dosing. 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  EF seems to be stable now 40% based off echocardiogram in December 2023.  Also has grade 2 diastolic dysfunction and mildly reduced RV function.  Moderate MR. Continue to hold all GDMT.  Reevaluate at follow-up if able to add this back on.  Hypotension, AKI, acute liver injury Unclear exact etiology, low suspicion for low output heart failure.  Hypotension has been improving.  Repeat labs this Thursday.  Creatinine 3.08.  Previously around 1.7.  Elevated troponins 488-960 likely related to RVR.  Discussed with Dr. Shirlee Latch and will plan for close follow-up with advanced heart failure. Will recheck CMP this Thursday.  Continue to hold all medications except amiodarone and Eliquis.  Patient has been seen by Dr. Cristal Deer and deemed stable for discharge.  Did the patient have an acute coronary syndrome (MI, NSTEMI, STEMI, etc) this admission?:  No                               Did the patient have a percutaneous coronary intervention (stent / angioplasty)?:  No.        The patient will be scheduled for a TOC follow up appointment in 14 days.  A message has been sent to the [facility] and Scheduling Pool at the office where the patient should be seen for follow up.  _____________  Discharge Vitals Blood pressure (!) 149/54, pulse (!) 51, temperature 97.8 F (36.6 C), temperature source Oral, resp. rate 18, height 5' 3.5" (1.613 m), weight 113.5 kg, SpO2 94%.  Filed Weights   09/18/23 1219  Weight: 113.5 kg    Labs & Radiologic Studies    CBC Recent Labs    09/18/23 1232 09/19/23 0226  WBC 12.0* 10.5  HGB 10.9* 9.7*  HCT 37.0 32.7*  MCV 87.1 87.0  PLT 550* 349   Basic Metabolic Panel Recent Labs    36/64/40 1232 09/18/23 1446  09/19/23 0226  NA 134*  --  138  K 5.2*  --  4.4  CL 102  --  105  CO2 19*  --  24  GLUCOSE 199*  --  92  BUN 50*  --  55*  CREATININE 3.09*  --  3.08*  CALCIUM 10.6*  --  10.1  MG  --  2.1  --    Liver Function Tests Recent Labs    09/18/23 1446  AST 234*  ALT 203*  ALKPHOS 50  BILITOT 0.6  PROT 5.1*  ALBUMIN 2.7*   No results for input(s): "LIPASE", "AMYLASE" in the last 72 hours. High Sensitivity Troponin:   Recent Labs  Lab 09/18/23 1446 09/18/23 1816  TROPONINIHS 488* 960*    BNP Invalid input(s): "POCBNP" D-Dimer No results for input(s): "DDIMER" in the last 72 hours. Hemoglobin  A1C No results for input(s): "HGBA1C" in the last 72 hours. Fasting Lipid Panel No results for input(s): "CHOL", "HDL", "LDLCALC", "TRIG", "CHOLHDL", "LDLDIRECT" in the last 72 hours. Thyroid Function Tests Recent Labs    09/18/23 1446  TSH 2.519   _____________  DG Chest Portable 1 View  Result Date: 09/18/2023 CLINICAL DATA:  Chest pain EXAM: PORTABLE CHEST 1 VIEW COMPARISON:  09/23/2013. FINDINGS: Cardiomegaly. Both lungs are clear. The visualized skeletal structures are unremarkable. IMPRESSION: Cardiomegaly without acute abnormality of the lungs in AP portable projection. Electronically Signed   By: Jearld Lesch M.D.   On: 09/18/2023 16:29   Disposition   Pt is being discharged home today in good condition.  Follow-up Plans & Appointments     Follow-up Information     Laurey Morale, MD. Go on 09/27/2023.   Specialty: Cardiology Why: Please come to cardiology follow up 10/9 @ 10 am w/ Dr. Suzanne Boron information: [address] [place] [address] [phone]                   Discharge Medications   Allergies as of 09/19/2023   No Known Allergies      Medication List     STOP taking these medications    carvedilol 6.25 MG tablet Commonly known as: COREG   Farxiga 10 MG Tabs tablet Generic drug: dapagliflozin propanediol    losartan 25 MG tablet Commonly known as: COZAAR   spironolactone 25 MG tablet Commonly known as: ALDACTONE       TAKE these medications    acetaminophen 325 MG tablet Commonly known as: TYLENOL Take 325-650 mg by mouth every 6 (six) hours as needed for moderate pain or headache.   amiodarone 200 MG tablet Commonly known as: PACERONE Take 1 tablet (200 mg total) by mouth daily.   Eliquis 5 MG Tabs tablet Generic drug: apixaban TAKE 1 TABLET BY MOUTH TWICE  DAILY   famotidine 20 MG tablet Commonly known as: PEPCID Take 20 mg by mouth daily as needed for heartburn or indigestion.   lactobacillus acidophilus Tabs tablet Take 1 tablet by mouth daily in the afternoon.           Outstanding Labs/Studies   CMP at follow up appointment.   Duration of Discharge Encounter   Greater than 30 minutes including physician time.  Signed, Abagail Kitchens, PA-C 09/19/2023, 12:09 PM

## 2023-09-19 NOTE — Progress Notes (Signed)
   Rounding Note    Patient Name: Kelli Webster Date of Encounter: 09/19/2023  [facility] Cardiologist: Dr. Shirlee Latch  Subjective   Feeling much better today. BP improving. Heart rates have been 40s-50, which she says is her baseline when she is at rest. Reviewed her lab abnormalities today, specifically her elevated Cr and LFTs. See below. She reports her weight is down about 2 lbs, and her breathing is stable.  Inpatient Medications    Scheduled Meds:  amiodarone  100 mg Oral Daily   apixaban  5 mg Oral BID   Continuous Infusions:  PRN Meds:    Vital Signs    Vitals:   09/19/23 0745 09/19/23 0830 09/19/23 0845 09/19/23 0917  BP:  (!) 117/54  (!) 149/54  Pulse: (!) 41 (!) 41 (!) 41 (!) 51  Resp: 20 17 17 18   Temp:    97.8 F (36.6 C)  TempSrc:    Oral  SpO2: 99% 100% 99% 94%  Weight:      Height:       No intake or output data in the 24 hours ending 09/19/23 1035    09/18/2023   12:19 PM 09/18/2023   11:38 AM 07/18/2023    1:25 PM  Last 3 Weights  Weight (lbs) 250 lb 3.6 oz 250 lb 3.2 oz 249 lb 3.2 oz  Weight (kg) 113.5 kg 113.49 kg 113.036 kg      Telemetry    Sinus bradycardia - Personally Reviewed  Physical Exam   GEN: No acute distress.   Neck: No JVD Cardiac: RRR, no murmurs, rubs, or gallops.  Respiratory: Clear to auscultation bilaterally. GI: Soft, nontender, non-distended  MS: No edema; No deformity. Neuro:  Nonfocal  Psych: Normal affect   New pertinent results (labs, ECG, imaging, cardiac studies)     Patient Profile     77 y.o. female with PMH paroxysmal atrial fibrillation/flutter, OSA not on CPAP, cardiomyopathy with EF 40%, chronic systolic and diastolic heart failure who is being seen 09/18/2023 for the evaluation of atrial flutter with RVR.   Assessment & Plan    Atrial flutter with RVR -now in sinus bradycardia post cardioversion -continue amiodarone -CHA2DS2/VAS Stroke Risk Points=4, continue apixaban, no missed doses.  Her Cr is typically normal. With high thromboembolic risk with cardioversion, will continue 5 mg BID dosing for now, monitor Cr closely. I expected Cr to improve now that she is not extremely tachycardic, and I do not want her to be underdosed. However, if her Cr does not improve, will need to consider alternative. -hold carvedilol given hypotension, bradycardia   Hypotension Acute kidney injury Acute liver injury Cardiomyopathy, chronic systolic and diastolic heart failure -hold losartan, spironolactone -monitor Cr closely. Today Cr 3.08 (peak 3.09) -albumin 2.7, AST/ALT elevated at 234/203  I discussed with Dr. Shirlee Latch. I anticipate her clinical status to continue to improve as she is no longer in rapid flutter. Clinically today she looks well, though her labs are abnormal. Plan will be to get her close follow up in the HF clinic in the next 2ish days, recheck labs. I will hold all of her meds except the amiodarone and DOAC until she is seen. Discussed with the patient, she will monitor her HR, weight, and vital signs and contact us with any new or worsening symptoms.    Signed, Jodelle Red, MD  09/19/2023, 10:35 AM

## 2023-09-19 NOTE — Telephone Encounter (Signed)
Transition of Care Follow-up Phone Call Request    Patient Name: Kelli Webster Date of Birth: 09-Apr-1946 Date of Encounter: 09/19/2023  Primary Care Provider:  Barbie Banner, MD Primary Cardiologist:  None  Garry Sackett has been scheduled for a transition of care follow up appointment with a [facility] provider:  Dr. Shirlee Latch 10/09. Have patient repeat CMP 09/21/2023 please.  Please reach out to Sharen Counter within 48 hours of discharge to confirm appointment and review transition of care protocol questionnaire. Anticipated discharge date: today   Abagail Kitchens, Cordelia Poche  09/19/2023, 12:00 PM

## 2023-09-19 NOTE — TOC Initial Note (Signed)
Transition of Care [facility]) - Initial/Assessment Note    Patient Details  Name: Kelli Webster MRN: [id number] Date of Birth: 1946-01-01  Transition of Care [facility]) CM/SW Contact:    Leone Haven, RN Phone Number: 09/19/2023, 12:39 PM  Clinical Narrative:                 From home with spouse, has PCP and insurance on file, states has no HH services in place at this time or DME at home.  States spouse will transport her home at dc and he is support system, [facility] [address] and [facility] for eliquis.  Pta self ambulatory .  Expected Discharge Plan: Home/Self Care Barriers to Discharge: No Barriers Identified   Patient Goals and CMS Choice Patient states their goals for this hospitalization and ongoing recovery are:: return home   Choice offered to / list presented to : NA      Expected Discharge Plan and Services In-house Referral: NA Discharge Planning Services: CM Consult Post Acute Care Choice: NA Living arrangements for the past 2 months: Single Family Home Expected Discharge Date: 09/19/23               DME Arranged: N/A         HH Arranged: NA          Prior Living Arrangements/Services Living arrangements for the past 2 months: Single Family Home Lives with:: Spouse Patient language and need for interpreter reviewed:: Yes Do you feel safe going back to the place where you live?: Yes      Need for Family Participation in Patient Care: Yes (Comment) Care giver support system in place?: Yes (comment)   Criminal Activity/Legal Involvement Pertinent to Current Situation/Hospitalization: No - Comment as needed  Activities of Daily Living   ADL Screening (condition at time of admission) Does the patient have a NEW difficulty with bathing/dressing/toileting/self-feeding that is expected to last >3 days?: No Does the patient have a NEW difficulty with getting in/out of bed, walking, or climbing stairs that is expected  to last >3 days?: No Does the patient have a NEW difficulty with communication that is expected to last >3 days?: No Is the patient deaf or have difficulty hearing?: No Does the patient have difficulty seeing, even when wearing glasses/contacts?: No Does the patient have difficulty concentrating, remembering, or making decisions?: No  Permission Sought/Granted Permission sought to share information with : Case Manager Permission granted to share information with : Yes, Verbal Permission Granted              Emotional Assessment Appearance:: Appears stated age Attitude/Demeanor/Rapport: Engaged Affect (typically observed): Appropriate Orientation: : Oriented to Self, Oriented to Place, Oriented to  Time, Oriented to Situation Alcohol / Substance Use: Not Applicable Psych Involvement: No (comment)  Admission diagnosis:  Palpitations [R00.2] Tachycardia [R00.0] Atrial flutter with rapid ventricular response (HCC) [I48.92] Hypotension, unspecified hypotension type [I95.9] Patient Active Problem List   Diagnosis Date Noted   CKD (chronic kidney disease) 09/19/2023   Hypotension 09/19/2023   Atrial flutter with rapid ventricular response (HCC) 09/18/2023   Morbid obesity (HCC) 09/15/2014   OSA (obstructive sleep apnea) 01/03/2014   Atrial fibrillation (HCC) 10/10/2013   Chronic systolic CHF (congestive heart failure) (HCC) 10/10/2013   PCP:  Barbie Banner, MD [facility]acy:   [phone] [place], [place] [facility] [address]  [place] [address] Phone: [phone] Fax: [phone]  [facility] Delivery - [place], [place] - [address] [place] [address] Phone: [phone] Fax: [phone]  [facility] Delivery) - [place], [place] - [address] [facility] [address] [place] [address] [place] [address] Phone: [phone] Fax:  [phone]     Social Determinants of Health (SDOH) Social History: SDOH Screenings   Food Insecurity: No Food Insecurity (09/18/2023)  Housing: Low Risk  (09/18/2023)  Transportation Needs: No Transportation Needs (09/18/2023)  Utilities: Not At Risk (09/18/2023)  Tobacco Use: Low Risk  (09/18/2023)   SDOH Interventions:     Readmission Risk Interventions     No data to display

## 2023-09-19 NOTE — Progress Notes (Signed)
Heart Failure Navigator Progress Note  Assessed for Heart & Vascular TOC clinic readiness.  Patient does not meet criteria due to Advanced Heart Failure Team patient of Dr. McLean.   Navigator will sign off at this time.   Mahreen Schewe, BSN, RN Heart Failure Nurse Navigator Secure Chat Only   

## 2023-09-19 NOTE — Care Management Obs Status (Signed)
MEDICARE OBSERVATION STATUS NOTIFICATION   Patient Details  Name: Kelli Webster MRN: [id number] Date of Birth: 03-09-1946   Medicare Observation Status Notification Given:  Yes    Lawerance Sabal, RN 09/19/2023, 9:27 AM

## 2023-09-20 NOTE — Addendum Note (Signed)
Addended by: Alois Cliche on: 09/20/2023 11:43 AM   Modules accepted: Orders

## 2023-09-20 NOTE — Telephone Encounter (Signed)
Transition Care Management Follow-up Telephone Call Date of discharge and from where: [facility] 09/19/2023  How have you been since you were released from the hospital? Pt reports doing well Any questions or concerns? Yes Pt wanted to verify her appointment with Dr Shirlee Latch  Items Reviewed: Did the pt receive and understand the discharge instructions provided? Yes  Medications obtained and verified? Yes  Other? No  Any new allergies since your discharge? No  Dietary orders reviewed? No Do you have support at home? Yes   Home Care and Equipment/Supplies: Were home health services ordered? n/a   Functional Questionnaire: (I = Independent and D = Dependent) ADLs: I   Bathing/Dressing- I  Meal Prep- I  Eating- I  Maintaining continence- I  Transferring/Ambulation- I  Managing Meds- I  Follow up appointments reviewed:  PCP Hospital f/u appt confirmed? Yes  pt is aware she needs to contact PCP to schedule follow up Specialist Hospital f/u appt confirmed? Yes  Scheduled to see Dr Shirlee Latch on 09/27/2023 @ 10am. Are transportation arrangements needed? No  If their condition worsens, is the pt aware to call PCP or go to the Emergency Dept.? Yes Was the patient provided with contact information for the PCP's office or ED? No Was to pt encouraged to call back with questions or concerns? Yes

## 2023-09-20 NOTE — Telephone Encounter (Signed)
Transition Care Management Unsuccessful Follow-up Telephone Call  Date of discharge and from where:  09/19/2023 [facility]  Attempts:  1st Attempt  Reason for unsuccessful TCM follow-up call:  Left voice message

## 2023-09-21 ENCOUNTER — Ambulatory Visit: Payer: Medicare Other | Attending: Cardiology

## 2023-09-21 DIAGNOSIS — N189 Chronic kidney disease, unspecified: Secondary | ICD-10-CM

## 2023-09-21 DIAGNOSIS — I5022 Chronic systolic (congestive) heart failure: Secondary | ICD-10-CM

## 2023-09-22 LAB — COMPREHENSIVE METABOLIC PANEL
ALT: 156 [IU]/L — ABNORMAL HIGH (ref 0–32)
AST: 66 [IU]/L — ABNORMAL HIGH (ref 0–40)
Albumin: 3.6 g/dL — ABNORMAL LOW (ref 3.8–4.8)
Alkaline Phosphatase: 81 [IU]/L (ref 44–121)
BUN/Creatinine Ratio: 21 (ref 12–28)
BUN: 33 mg/dL — ABNORMAL HIGH (ref 8–27)
Bilirubin Total: 0.4 mg/dL (ref 0.0–1.2)
CO2: 24 mmol/L (ref 20–29)
Calcium: 10.5 mg/dL — ABNORMAL HIGH (ref 8.7–10.3)
Chloride: 106 mmol/L (ref 96–106)
Creatinine, Ser: 1.55 mg/dL — ABNORMAL HIGH (ref 0.57–1.00)
Globulin, Total: 2.5 g/dL (ref 1.5–4.5)
Glucose: 139 mg/dL — ABNORMAL HIGH (ref 70–99)
Potassium: 5.3 mmol/L — ABNORMAL HIGH (ref 3.5–5.2)
Sodium: 144 mmol/L (ref 134–144)
Total Protein: 6.1 g/dL (ref 6.0–8.5)
eGFR: 35 mL/min/{1.73_m2} — ABNORMAL LOW (ref >59)

## 2023-09-27 ENCOUNTER — Ambulatory Visit (HOSPITAL_COMMUNITY)
Admit: 2023-09-27 | Discharge: 2023-09-27 | Disposition: A | Discharge status: Home / Self Care | Payer: Medicare Other | Attending: Cardiology | Admitting: Cardiology

## 2023-09-27 ENCOUNTER — Encounter (HOSPITAL_COMMUNITY): Payer: Self-pay | Admitting: Cardiology

## 2023-09-27 VITALS — BP 128/78 | HR 65 | Wt 246.0 lb

## 2023-09-27 DIAGNOSIS — Z6841 Body Mass Index (BMI) 40.0 and over, adult: Secondary | ICD-10-CM | POA: Insufficient documentation

## 2023-09-27 DIAGNOSIS — I4891 Unspecified atrial fibrillation: Secondary | ICD-10-CM | POA: Insufficient documentation

## 2023-09-27 DIAGNOSIS — Z79899 Other long term (current) drug therapy: Secondary | ICD-10-CM | POA: Diagnosis not present

## 2023-09-27 DIAGNOSIS — Z7901 Long term (current) use of anticoagulants: Secondary | ICD-10-CM | POA: Diagnosis not present

## 2023-09-27 DIAGNOSIS — G4733 Obstructive sleep apnea (adult) (pediatric): Secondary | ICD-10-CM | POA: Diagnosis not present

## 2023-09-27 DIAGNOSIS — I428 Other cardiomyopathies: Secondary | ICD-10-CM | POA: Insufficient documentation

## 2023-09-27 DIAGNOSIS — I459 Conduction disorder, unspecified: Secondary | ICD-10-CM | POA: Diagnosis not present

## 2023-09-27 DIAGNOSIS — Z8616 Personal history of COVID-19: Secondary | ICD-10-CM | POA: Diagnosis not present

## 2023-09-27 DIAGNOSIS — E669 Obesity, unspecified: Secondary | ICD-10-CM | POA: Diagnosis not present

## 2023-09-27 DIAGNOSIS — I5022 Chronic systolic (congestive) heart failure: Secondary | ICD-10-CM | POA: Insufficient documentation

## 2023-09-27 DIAGNOSIS — I4892 Unspecified atrial flutter: Secondary | ICD-10-CM | POA: Insufficient documentation

## 2023-09-27 DIAGNOSIS — I484 Atypical atrial flutter: Secondary | ICD-10-CM | POA: Diagnosis not present

## 2023-09-27 LAB — COMPREHENSIVE METABOLIC PANEL
ALT: 33 U/L (ref 0–44)
AST: 23 U/L (ref 15–41)
Albumin: 3.4 g/dL — ABNORMAL LOW (ref 3.5–5.0)
Alkaline Phosphatase: 62 U/L (ref 38–126)
Anion gap: 9 (ref 5–15)
BUN: 18 mg/dL (ref 8–23)
CO2: 23 mmol/L (ref 22–32)
Calcium: 10.7 mg/dL — ABNORMAL HIGH (ref 8.9–10.3)
Chloride: 108 mmol/L (ref 98–111)
Creatinine, Ser: 1.5 mg/dL — ABNORMAL HIGH (ref 0.44–1.00)
GFR, Estimated: 36 mL/min — ABNORMAL LOW (ref >60)
Glucose, Bld: 115 mg/dL — ABNORMAL HIGH (ref 70–99)
Potassium: 4.7 mmol/L (ref 3.5–5.1)
Sodium: 140 mmol/L (ref 135–145)
Total Bilirubin: 0.6 mg/dL (ref 0.3–1.2)
Total Protein: 6.2 g/dL — ABNORMAL LOW (ref 6.5–8.1)

## 2023-09-27 LAB — BRAIN NATRIURETIC PEPTIDE: B Natriuretic Peptide: 518.8 pg/mL — ABNORMAL HIGH (ref 0.0–100.0)

## 2023-09-27 MED ORDER — FARXIGA 10 MG PO TABS: 10.0000 mg | ORAL_TABLET | Freq: Every day | ORAL | 11 refills | Status: DC

## 2023-09-27 MED ORDER — CARVEDILOL 3.125 MG PO TABS: 3.1250 mg | ORAL_TABLET | Freq: Two times a day (BID) | ORAL | 3 refills | Status: DC

## 2023-09-27 NOTE — Patient Instructions (Addendum)
RESTART Carvedilol 3.125 mg Twice daily  RESTART Farxiga 10 mg daily.  Labs done today, your results will be available in MyChart, we will contact you for abnormal readings.  Repeat blood work in 10 days.  Your physician has requested that you have an echocardiogram. Echocardiography is a painless test that uses sound waves to create images of your heart. It provides your doctor with information about the size and shape of your heart and how well your heart's chambers and valves are working. This procedure takes approximately one hour. There are no restrictions for this procedure. Please do NOT wear cologne, perfume, aftershave, or lotions (deodorant is allowed). Please arrive 15 minutes prior to your appointment time.  You have been referred to the Electrophysiologist. They will call you to arrange your appointment.  You have been referred to the HEART CARE PHARMACY for Semaglutide. They will call you to arrange your appointment.  Your physician recommends that you schedule a follow-up appointment in: 6 weeks  If you have any questions or concerns before your next appointment please send [place] a message through [place] or call our office at [phone].    TO LEAVE A MESSAGE FOR THE NURSE SELECT OPTION 2, PLEASE LEAVE A MESSAGE INCLUDING: YOUR NAME DATE OF BIRTH CALL BACK NUMBER REASON FOR CALL**this is important as we prioritize the call backs  YOU WILL RECEIVE A CALL BACK THE SAME DAY AS LONG AS YOU CALL BEFORE 4:00 PM  At the Advanced Heart Failure Clinic, you and your health needs are our priority. As part of our continuing mission to provide you with exceptional heart care, we have created designated Provider Care Teams. These Care Teams include your primary Cardiologist (physician) and Advanced Practice Providers (APPs- Physician Assistants and Nurse Practitioners) who all work together to provide you with the care you need, when you need it.   You may see any of the following  providers on your designated Care Team at your next follow up: Dr Arvilla Meres Dr [name] Dr. Dorthula Nettles Dr. Clearnce Hasten Amy Filbert Schilder, NP Robbie Lis, [place] [facility] [place], [place] Brynda Peon, NP [place], NP Karle Plumber, PharmD   Please be sure to bring in all your medications bottles to every appointment.    Thank you for choosing [facility]

## 2023-09-27 NOTE — Progress Notes (Signed)
Patient ID: Kelli Webster, female   DOB: [birth year], 77 y.o.   MRN: [id number]  PCP: Dr. Andrey Campanile EP: Dr Johney Frame HF MD: Dr Shirlee Latch.   77 y.o. woman with h/o obesity, PAF, moderate OSA (cannot tolerate CPAP) and systolic HF.  She was admitted in 09/2013 with severe dyspnea for several days and was found to have atrial fibrillation with RVR and volume overload.  EF 20-25% on echo, diffuse hypokinesis.   She was cardioverted twice but both times went back into atrial fibrillation, the 2nd time on amiodarone.    She had an atrial fibrillation ablation in 12/14 that was successful.  TEE in 12/14 showed EF 30%.  Repeat echo in 4/15 showed EF up to 45%.  Echo in 6/17 showed EF down to 25-30%.  Echo in 6/18 showed EF back up to 45-50%.  In 3/20 she had recurrent atrial fibrillation and was cardioverted.  In 11/20, echo showed EF 40-45% with normal RV.   She was cardioverted again in 12/21 and 1/22.  In 3/22, she had redo atrial fibrillation ablation.  However, she remained in atypical atrial flutter and Dr. Johney Frame stopped her amiodarone in 8/22.  She had COVID-19 in 8/22.   She was in atypical flutter at follow up 11/22. Started on amiodarone and underwent successful DCCV 11/22.  Echo 12/23 showed EF 40% with diffuse hypokinesis, mildly decreased RV systolic function, moderate functional MR, PASP 54.   In 9/24, she felt her heart start racing again and noted HR up to 170s.  HR was up and down for about 2 days and she felt some lightheadedness.  She came by the office and was noted to be in rapid atrial flutter.  She was sent to the ER.  She was cardioverted back to NSR in the ER.  She was noted to have elevated BNP, HS-TnI, LFTs and creatinine to 3.09 in setting of hypotension and atrial flutter/RVR.  Coreg, losartan, and Marcelline Deist were stopped.    She returns for followup of CHF and atrial arrhythmias today.  She is in NSR on amiodarone, which has been increased to 200 mg daily.  She has had no further  palpitations.  Weight down 1 lb.  BP stable.  She feels back to normal; no lightheadedness, exertional dyspnea, or chest pain.  No orthopnea/PND.    ECG (personally reviewed): NSR, IVCD 150 msec  Labs (8/17): K 4.8, creatinine 1.53, hgb 11.6 Labs (10/17): K 4.7, creatinine 1.37, BNP 74 Labs (2/18): K 4.9, creatinine 1.47, hgb 11.5 Labs (7/18): hgb 11.6, K 4.5, creatinine 1.5 Labs (2/20): K 5, creatinine 1.6 Labs (2/22): K 5, creatinine 1.72 Labs (11/22): K 4.7, creatinine 1.97 Labs (02/2022): K 5.1 Creatinine 1.68 Labs (6/23): K 4.7, creatinine 1.65, LFTs normal, hgb 11.2, TSH normal Labs (9/23): K 4.7, creatinine 1.77, LFTs normal, TSH normal Labs (12/23): K 4.4, creatinine 1.52, LFTs normal, TSH normal Labs (9/24): AST 234, ALT 203, BNP 2317, Hs-TnI 960, TSH normal, creatinine 3.08 Labs (10/24): creatinine 1.55, K 5.3   PMH:  1. Obesity 2. GERD 3. OA 4. EGD negative in 10/14 5. ACEI cough 6. Atrial fibrillation: First diagnosed in 10/14.  DCCV to NSR in 09/2013 but atrial fibrillation recurred. Cardioversion 10/18/2013 successful on amiodarone but back in atrial fibrillation in 11/14.  Atrial fibrillation ablation ([place]) 12/14.  - DCCV to NSR in 3/20, 12/21, 1/22 - Redo atrial fibrillation ablation in 3/22.   7. Cardiomyopathy: Echo (10/14) with EF 20-25%, diffuse HK.  LHC (10/14) showed no  CAD.  TSH normal, HIV negative, SPEP/UPEP negative.  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  TEE (12/14) with EF 30%, diffuse hypokinesis, mild MR, mildly decreased RV systolic function.  Echo (4/15) with EF 45%, diffuse hypokinesis, mildly dilated LV, normal RV size and systolic function.  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 - Echo (6/17) with EF 25-30%, mild MR.  - Echo (6/18) with EF 45-50%, mildly dilated LV, mild MR.  - Echo (11/20) with EF 40-45%, normal RV.  - Echo (11/22): EF 35-40%, RV normal, mild MR.  - Echo (12/23): EF 40% with diffuse hypokinesis, mildly decreased RV systolic  function, moderate functional MR, PASP 54, IVC normal.  8. Sinus bradycardia 9. OSA: Cannot tolerate CPAP. 10. CKD stage 3 11. Low back pain/sciatica.  12. COVID-19 8/22 13. Fe deficiency anemia 14. Atrial flutter: Atypical.  DCCV in 9/24.   SH: Married, lives in [place], nonsmoker.   FH: No history of cardiomyopathy or premature CAD.   ROS: All systems reviewed and negative except as per HPI.   Current Outpatient Medications  Medication Sig Dispense Refill   acetaminophen (TYLENOL) 325 MG tablet Take 325-650 mg by mouth every 6 (six) hours as needed for moderate pain or headache.     amiodarone (PACERONE) 200 MG tablet Take 1 tablet (200 mg total) by mouth daily. 90 tablet 6   carvedilol (COREG) 3.125 MG tablet Take 1 tablet (3.125 mg total) by mouth 2 (two) times daily. 180 tablet 3   ELIQUIS 5 MG TABS tablet TAKE 1 TABLET BY MOUTH TWICE  DAILY 200 tablet 2   famotidine (PEPCID) 20 MG tablet Take 20 mg by mouth daily as needed for heartburn or indigestion.     FARXIGA 10 MG TABS tablet Take 1 tablet (10 mg total) by mouth daily before breakfast. 30 tablet 11   lactobacillus acidophilus (BACID) TABS tablet Take 1 tablet by mouth every other day.     No current facility-administered medications for this encounter.   BP 128/78   Pulse 65   Wt 111.6 kg (246 lb)   SpO2 98%   BMI 42.89 kg/m   Wt Readings from Last 3 Encounters:  09/27/23 111.6 kg (246 lb)  09/18/23 113.5 kg (250 lb 3.6 oz)  09/18/23 113.5 kg (250 lb 3.2 oz)   General: NAD, obese.  Neck: No JVD, no thyromegaly or thyroid nodule.  Lungs: Clear to auscultation bilaterally with normal respiratory effort. CV: Nondisplaced PMI.  Heart regular S1/S2, no S3/S4, no murmur.  No peripheral edema.  No carotid bruit.  Normal pedal pulses.  Abdomen: Soft, nontender, no hepatosplenomegaly, no distention.  Skin: Intact without lesions or rashes.  Neurologic: Alert and oriented x 3.  Psych: Normal affect. Extremities:  No clubbing or cyanosis.  HEENT: Normal.   Assessment/Plan: 1. 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 Echo 6/17 EF 25-30%, Echo 9/20 with EF 40-45%, echo 11/22 showed EF 35-40% (in setting of atrial flutter).  Echo 12/23 showed EF still low at 40% so suspect her cardiomyopathy is not only tachycardia-mediated.  She is not volume overloaded on exam.  NYHA class II symptoms.  She is off most of her GDMT since 9/24 when she presented to [facility] with atrial flutter/RVR for several days and hypotension/AKI. BP now stable, creatinine seems to be trending back to baseline (around 1.5). - Restart Coreg 3.125 mg bid.  - Restart Farxiga 10 mg daily.  - BMET/BNP today, BMET 10 days. If creatinine/K remain stable, will restart  losartan (did not tolerate Entresto due to lightheadedness and has not wanted to retry).  - She does not appear to need Lasix.  - Repeat echo to reassess LV EF.  - She feels like she is too claustrophobic to do cardiac MRI.  - EF is out of ICD range.  2. Atrial fibrillation/flutter: She developed atypical atrial flutter after redo atrial fibrillation ablation in 3/22.  Amiodarone was stopped in 8/22. Back in atypical aflutter 11/22. Amiodarone restarted and underwent successful DCCV. She had maintained NSR until 9/24 when she had atypical atrial flutter again with RVR and hypotension/AKI.  She had DCCV in 9/24 and is in NSR today.  Amiodarone increased from 100 mg daily to 200 mg daily.  - Continue amiodarone 200 mg daily.  Repeat LFTs today, suspect elevated LFTs in 9/24 were due to shock liver with hypotension and not amiodarone.  TSH recently normal.  Will need regular eye exam.  - Continue apixaban 5 mg bid.  - She had a severe decompensation with atrial flutter in 9/24.  I would like her evaluated by EP again to consider atrial flutter ablation.  If we can ablate her, may be able to take her off amiodarone.  3. OSA: She has been diagnosed in  the past but cannot tolerate CPAP.  4. Obesity: Body mass index is 42.89 kg/m. - We discussed semaglutide, she is interested in this so will refer her to pharmacy clinic.   Follow up in 3 wks with HF pharmacist for med titration (?restart losartan), see APP in 6 wks.  Needs EP appt.  [name]  09/27/2023

## 2023-10-09 ENCOUNTER — Ambulatory Visit (HOSPITAL_COMMUNITY)
Admission: RE | Admit: 2023-10-09 | Discharge: 2023-10-09 | Disposition: A | Discharge status: Home / Self Care | Payer: Medicare Other | Source: Ambulatory Visit | Attending: Cardiology | Admitting: Cardiology

## 2023-10-09 DIAGNOSIS — I5022 Chronic systolic (congestive) heart failure: Secondary | ICD-10-CM | POA: Insufficient documentation

## 2023-10-09 LAB — BASIC METABOLIC PANEL
Anion gap: 9 (ref 5–15)
BUN: 21 mg/dL (ref 8–23)
CO2: 25 mmol/L (ref 22–32)
Calcium: 11.2 mg/dL — ABNORMAL HIGH (ref 8.9–10.3)
Chloride: 103 mmol/L (ref 98–111)
Creatinine, Ser: 1.54 mg/dL — ABNORMAL HIGH (ref 0.44–1.00)
GFR, Estimated: 35 mL/min — ABNORMAL LOW (ref >60)
Glucose, Bld: 128 mg/dL — ABNORMAL HIGH (ref 70–99)
Potassium: 4.3 mmol/L (ref 3.5–5.1)
Sodium: 137 mmol/L (ref 135–145)

## 2023-10-11 ENCOUNTER — Encounter (HOSPITAL_COMMUNITY): Payer: Medicare Other

## 2023-10-13 NOTE — Progress Notes (Incomplete)
***In Progress***    Advanced Heart Failure Clinic Note  PCP: Dr. Andrey Campanile EP: Dr Johney Frame HF Cardiologist: Dr Shirlee Latch  HPI:  77 y.o. woman with h/o obesity, PAF, moderate OSA (cannot tolerate CPAP) and systolic HF.   She was admitted in 09/2013 with severe dyspnea for several days and was found to have atrial fibrillation with RVR and volume overload.  EF 20-25% on echo, diffuse hypokinesis.   She was cardioverted twice but both times went back into atrial fibrillation, the 2nd time on amiodarone.     She had an atrial fibrillation ablation in 12/14 that was successful.  TEE in 12/14 showed EF 30%.  Repeat echo in 4/15 showed EF up to 45%.  Echo in 6/17 showed EF down to 25-30%.  Echo in 6/18 showed EF back up to 45-50%.  In 3/20 she had recurrent atrial fibrillation and was cardioverted.  In 11/20, echo showed EF 40-45% with normal RV.    She was cardioverted again in 12/21 and 1/22.  In 3/22, she had redo atrial fibrillation ablation.  However, she remained in atypical atrial flutter and Dr. Johney Frame stopped her amiodarone in 8/22.  She had COVID-19 in 8/22.    She was in atypical flutter at follow up 11/22. Started on amiodarone and underwent successful DCCV 11/22.   Echo 12/23 showed EF 40% with diffuse hypokinesis, mildly decreased RV systolic function, moderate functional MR, PASP 54.    In 9/24, she felt her heart start racing again and noted HR up to 170s.  HR was up and down for about 2 days and she felt some lightheadedness.  She came by the office and was noted to be in rapid atrial flutter.  She was sent to the ER.  She was cardioverted back to NSR in the ER.  She was noted to have elevated BNP, HS-TnI, LFTs and creatinine to 3.09 in setting of hypotension and atrial flutter/RVR.  Coreg, losartan, and Marcelline Deist were stopped.     09/27/23 with Dr. Shirlee Latch - She returns for followup of CHF and atrial arrhythmias today.  She is in NSR on amiodarone, which has been increased to 200 mg daily.   She has had no further palpitations.  Weight down 1 lb.  BP stable.  She feels back to normal; no lightheadedness, exertional dyspnea, or chest pain.  No orthopnea/PND.     ECG (personally reviewed): NSR, IVCD 150 msec   Today she returns to HF clinic for pharmacist medication titration. At last visit with MD, carvedilol 3.125 mg BID and Farxiga 10 mg daily was restarted.   Overall feeling ***. Dizziness, lightheadedness, fatigue:  Chest pain or palpitations:  How is your breathing?: *** SOB: Able to complete all ADLs. Activity level ***  Weight at home pounds. Takes furosemide/torsemide/bumex *** mg *** daily.  LEE PND/Orthopnea  Appetite *** Low-salt diet:   Physical Exam Cost/affordability of meds   HF Medications: Carvedilol 3.125 mg BID Farxiga 10 mg daily  Has the patient been experiencing any side effects to the medications prescribed?  {YES NO:22349}  Does the patient have any problems obtaining medications due to transportation or finances?   {YES NO:22349}  Understanding of regimen: {excellent/good/fair/poor:19665} Understanding of indications: {excellent/good/fair/poor:19665} Potential of compliance: {excellent/good/fair/poor:19665} Patient understands to avoid NSAIDs. Patient understands to avoid decongestants.    Pertinent Lab Values: 10/09/23 Serum creatinine 1.54, BUN 21, Potassium 4.3, Sodium 137, BNP 518.8 (09/27/23), Magnesium 2.1 (09/18/23)  Vital Signs: Weight: *** (last clinic weight: 246 lbs) Blood pressure: ***  Heart rate: ***   Assessment/Plan: 1. 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 Echo 6/17 EF 25-30%, Echo 9/20 with EF 40-45%, echo 11/22 showed EF 35-40% (in setting of atrial flutter).  Echo 12/23 showed EF still low at 40% so suspect her cardiomyopathy is not only tachycardia-mediated.  She is not volume overloaded on exam.  NYHA class II symptoms.  She is off most of her GDMT since 9/24 when  she presented to [facility] with atrial flutter/RVR for several days and hypotension/AKI. BP now stable, creatinine seems to be trending back to baseline (around 1.5). - Restart Coreg 3.125 mg bid.  - Restart Farxiga 10 mg daily.  - BMET/BNP today, BMET 10 days. If creatinine/K remain stable, will restart losartan (did not tolerate Entresto due to lightheadedness and has not wanted to retry).  - She does not appear to need Lasix.  - Repeat echo to reassess LV EF.  - She feels like she is too claustrophobic to do cardiac MRI.  - EF is out of ICD range.  2. Atrial fibrillation/flutter: She developed atypical atrial flutter after redo atrial fibrillation ablation in 3/22.  Amiodarone was stopped in 8/22. Back in atypical aflutter 11/22. Amiodarone restarted and underwent successful DCCV. She had maintained NSR until 9/24 when she had atypical atrial flutter again with RVR and hypotension/AKI.  She had DCCV in 9/24 and is in NSR today.  Amiodarone increased from 100 mg daily to 200 mg daily.  - Continue amiodarone 200 mg daily.  Repeat LFTs today, suspect elevated LFTs in 9/24 were due to shock liver with hypotension and not amiodarone.  TSH recently normal.  Will need regular eye exam.  - Continue apixaban 5 mg bid.  - She had a severe decompensation with atrial flutter in 9/24.  I would like her evaluated by EP again to consider atrial flutter ablation.  If we can ablate her, may be able to take her off amiodarone.  3. OSA: She has been diagnosed in the past but cannot tolerate CPAP.  4. Obesity: Body mass index is 42.89 kg/m. - We discussed semaglutide, she is interested in this so will refer her to pharmacy clinic.    Follow up in 3 wks with HF pharmacist for med titration (?restart losartan), see APP in 6 wks.  Needs EP appt.  .     Follow up ***   Karle Plumber, PharmD, BCPS, BCCP, CPP Heart Failure Clinic Pharmacist [phone]

## 2023-10-17 ENCOUNTER — Ambulatory Visit (HOSPITAL_COMMUNITY)
Admission: RE | Admit: 2023-10-17 | Discharge: 2023-10-17 | Disposition: A | Discharge status: Home / Self Care | Payer: Medicare Other | Source: Ambulatory Visit | Attending: Internal Medicine | Admitting: Internal Medicine

## 2023-10-17 VITALS — BP 108/70 | HR 60 | Wt 244.2 lb

## 2023-10-17 DIAGNOSIS — I4892 Unspecified atrial flutter: Secondary | ICD-10-CM | POA: Insufficient documentation

## 2023-10-17 DIAGNOSIS — I480 Paroxysmal atrial fibrillation: Secondary | ICD-10-CM | POA: Diagnosis not present

## 2023-10-17 DIAGNOSIS — Z6841 Body Mass Index (BMI) 40.0 and over, adult: Secondary | ICD-10-CM | POA: Diagnosis not present

## 2023-10-17 DIAGNOSIS — I5022 Chronic systolic (congestive) heart failure: Secondary | ICD-10-CM | POA: Diagnosis present

## 2023-10-17 DIAGNOSIS — G4733 Obstructive sleep apnea (adult) (pediatric): Secondary | ICD-10-CM | POA: Diagnosis not present

## 2023-10-17 MED ORDER — LOSARTAN POTASSIUM 25 MG PO TABS: 25.0000 mg | ORAL_TABLET | Freq: Every day | ORAL | 3 refills | Status: DC

## 2023-10-17 NOTE — Patient Instructions (Signed)
It was a pleasure seeing you today!  MEDICATIONS: -We are changing your medications today -Restart losartan 25 mg (1 tablet) daily -Call if you have questions about your medications.  NEXT APPOINTMENT: Return to clinic in 3 weeks with APP Clinic.  In general, to take care of your heart failure: -Limit your fluid intake to 2 Liters (half-gallon) per day.   -Limit your salt intake to ideally 2-3 grams (2000-3000 mg) per day. -Weigh yourself daily and record, and bring that "weight diary" to your next appointment.  (Weight gain of 2-3 pounds in 1 day typically means fluid weight.) -The medications for your heart are to help your heart and help you live longer.   -Please contact us before stopping any of your heart medications.  Call the clinic at [phone] with questions or to reschedule future appointments.

## 2023-10-17 NOTE — Progress Notes (Addendum)
Advanced Heart Failure Clinic Note   PCP: Dr. Andrey Campanile EP: Dr Johney Frame HF Cardiologist: Dr Shirlee Latch  HPI:  77 y.o. woman with h/o obesity, PAF, moderate OSA (cannot tolerate CPAP) and systolic HF.   She was admitted in 09/2013 with severe dyspnea for several days and was found to have atrial fibrillation with RVR and volume overload. EF 20-25% on echo, diffuse hypokinesis. She was cardioverted twice but both times went back into atrial fibrillation, the 2nd time on amiodarone.     She had an atrial fibrillation ablation in 11/2013 that was successful. TEE in 11/2013 showed EF 30%. Repeat echo in 03/2014 showed EF up to 45%. Echo in 05/2016 showed EF down to 25-30%.  Echo in 05/2017 showed EF back up to 45-50%. In 02/2019 she had recurrent atrial fibrillation and was cardioverted. In 10/2019, echo showed EF 40-45% with normal RV.    She was cardioverted again in 11/2020 and 12/2020. In 02/2021, she had redo atrial fibrillation ablation. However, she remained in atypical atrial flutter and Dr. Johney Frame stopped her amiodarone in 07/2021.  She had COVID-19 in 07/2021.    10/2021 She was in atypical flutter at follow up. Started on amiodarone and underwent successful DCCV.    Echo 11/2022 showed EF 40% with diffuse hypokinesis, mildly decreased RV systolic function, moderate functional MR, PASP 54.    In 08/2023, she felt her heart start racing again and noted HR up to 170s. HR was up and down for about 2 days and she felt some lightheadedness. She came by the office and was noted to be in rapid atrial flutter. She was sent to the ER. She was cardioverted back to NSR in the ER. She was noted to have elevated BNP, HS-TnI, LFTs and creatinine to 3.09 in setting of hypotension and atrial flutter/RVR.  Carvedilol, losartan, and Marcelline Deist were stopped.     Recently returned for f/u of CHF and atrial arrhythmias on 09/27/23 with Dr. Shirlee Latch. She was in NSR on amiodarone, which had been increased to 200 mg daily. She had  no further palpitations. Weight down 1 lb. BP stable. She felt back to normal; no lightheadedness, exertional dyspnea, or chest pain. No orthopnea/PND.     ECG (09/2023): NSR, IVCD 150 msec  Today she returns to HF clinic for pharmacist medication titration. At last visit with MD, carvedilol 3.125 mg BID and Farxiga 10 mg daily were restarted. Overall feeling good today. Main complaint is back pain, having strained her back while bending down to pick up her iPad two days earlier. She notes this aggravated her sciatica. No dizziness or lightheadedness. No fatigue. No CP/palpitations. She reports no SOB, activity is mostly limited by her sciatica. She can complete a full grocery shopping trip without any problems and feels confident that she can walk continuously for about 10 to 12 minutes with without stopping to rest. Weight at home has been stable around 232 lbs. She mentions that her scale appears to consistently read 10 pounds lighter than ours. In clinic, down 2 lbs from last visit. She is not on diuretics. No LEE, PND or orthopnea. She has been trying to watch what she eats to lose weight. She is not interested in starting semaglutide right now because she is already taking two expensive medications, and she believes she can lose the weight on her own. Does not monitor BP at home. In clinic, BP 108/70 mmHg. However, this reading is likely inaccurate as we had difficulty obtaining a proper measurement  due to the pain she experienced from the blood pressure cuff (see above, she hurt her back two days ago).   HF Medications:  Carvedilol 3.125 mg BID Farxiga 10 mg daily  Has the patient been experiencing any side effects to the medications prescribed? No  Does the patient have any problems obtaining medications due to transportation or finances? No - [employer] Medicare. Has PAN grant for [employer]   Understanding of regimen: good Understanding of indications: good Potential of compliance: good Patient  understands to avoid NSAIDs. Patient understands to avoid decongestants.    Pertinent Lab Values: 10/09/23 Serum creatinine 1.54, BUN 21, Potassium 4.3, Sodium 137, BNP 518.8 (09/27/23), Magnesium 2.1 (09/18/23)  Vital Signs: Weight: 244.2 lbs (last clinic weight: 246 lbs) Blood pressure: 108/70 mmHg  Heart rate: 60 bpm   Assessment/Plan: 1. 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 Echo 05/2016 EF 25-30%, Echo 08/2019 with EF 40-45%, echo 10/2021 showed EF 35-40% (in setting of atrial flutter). Echo 11/2022 showed EF still low at 40% so suspect her cardiomyopathy is not only tachycardia-mediated. She was off most of her GDMT since 08/2023 when she presented to [facility] with atrial flutter/RVR for several days and hypotension/AKI. BP now stable, creatinine seems to be trending back to baseline (around 1.5).  - She is not volume overloaded on exam. NYHA class II symptoms.  - She does not appear to need diuretics. - Continue carvedilol 3.125 mg BID.  - Restart losartan 25 mg daily. Repeat BMET at follow up. She was unable to tolerate Entresto in the past due to lightheadedness.  - Continue Farxiga 10 mg daily.  - Repeat echo at next visit to reassess LV EF.  - She felt like she was too claustrophobic to do cardiac MRI.  - EF was out of ICD range.   2. Atrial fibrillation/flutter: She developed atypical atrial flutter after redo atrial fibrillation ablation in 02/2021. Amiodarone was stopped in 07/2021. Back in atypical aflutter 10/2021. Amiodarone restarted and underwent successful DCCV. She had maintained NSR until 08/2023 when she had atypical atrial flutter again with RVR and hypotension/AKI. She had DCCV in 08/2023 and is in NSR today.  - Continue Amiodarone 200 mg daily.  - Continue apixaban 5 mg BID.  - Will need regular eye exam.  - She had a severe decompensation with atrial flutter in 08/2023. Atrial flutter ablation consult scheduled for December. If  we can ablate her, may be able to take her off amiodarone.   3. OSA: She had been diagnosed in the past but could not tolerate CPAP.   4. Obesity: Body mass index is 42.58 kg/m. - She is not interested in starting semaglutide because she is already taking two expensive medications, and she believes she can lose the weight on her own.  Follow up in 3 weeks with APP clinic + echo.  Karle Plumber, PharmD, BCPS, BCCP, CPP Heart Failure Clinic Pharmacist [phone]

## 2023-11-08 ENCOUNTER — Ambulatory Visit (HOSPITAL_COMMUNITY)
Admission: RE | Admit: 2023-11-08 | Discharge: 2023-11-08 | Disposition: A | Discharge status: Home / Self Care | Payer: Medicare Other | Source: Ambulatory Visit | Attending: Cardiology | Admitting: Cardiology

## 2023-11-08 ENCOUNTER — Ambulatory Visit (HOSPITAL_BASED_OUTPATIENT_CLINIC_OR_DEPARTMENT_OTHER)
Admission: RE | Admit: 2023-11-08 | Discharge: 2023-11-08 | Disposition: A | Discharge status: Home / Self Care | Payer: Medicare Other | Source: Ambulatory Visit | Attending: Family Medicine | Admitting: Family Medicine

## 2023-11-08 ENCOUNTER — Encounter (HOSPITAL_COMMUNITY): Payer: Self-pay

## 2023-11-08 VITALS — BP 142/80 | HR 73 | Wt 241.8 lb

## 2023-11-08 DIAGNOSIS — I4891 Unspecified atrial fibrillation: Secondary | ICD-10-CM | POA: Insufficient documentation

## 2023-11-08 DIAGNOSIS — N183 Chronic kidney disease, stage 3 unspecified: Secondary | ICD-10-CM | POA: Insufficient documentation

## 2023-11-08 DIAGNOSIS — I5022 Chronic systolic (congestive) heart failure: Secondary | ICD-10-CM | POA: Insufficient documentation

## 2023-11-08 DIAGNOSIS — I080 Rheumatic disorders of both mitral and aortic valves: Secondary | ICD-10-CM | POA: Insufficient documentation

## 2023-11-08 DIAGNOSIS — M545 Low back pain, unspecified: Secondary | ICD-10-CM | POA: Diagnosis not present

## 2023-11-08 DIAGNOSIS — G4733 Obstructive sleep apnea (adult) (pediatric): Secondary | ICD-10-CM | POA: Diagnosis not present

## 2023-11-08 DIAGNOSIS — E669 Obesity, unspecified: Secondary | ICD-10-CM | POA: Insufficient documentation

## 2023-11-08 DIAGNOSIS — I484 Atypical atrial flutter: Secondary | ICD-10-CM | POA: Diagnosis not present

## 2023-11-08 DIAGNOSIS — Z6841 Body Mass Index (BMI) 40.0 and over, adult: Secondary | ICD-10-CM | POA: Insufficient documentation

## 2023-11-08 DIAGNOSIS — Z7901 Long term (current) use of anticoagulants: Secondary | ICD-10-CM | POA: Insufficient documentation

## 2023-11-08 DIAGNOSIS — G8929 Other chronic pain: Secondary | ICD-10-CM | POA: Diagnosis not present

## 2023-11-08 DIAGNOSIS — Z79899 Other long term (current) drug therapy: Secondary | ICD-10-CM | POA: Diagnosis not present

## 2023-11-08 DIAGNOSIS — I428 Other cardiomyopathies: Secondary | ICD-10-CM | POA: Insufficient documentation

## 2023-11-08 LAB — ECHOCARDIOGRAM COMPLETE
AR max vel: 1.68 cm2
AV Area VTI: 1.65 cm2
AV Area mean vel: 1.65 cm2
AV Mean grad: 5 mm[Hg]
AV Peak grad: 10.5 mm[Hg]
Ao pk vel: 1.62 m/s
Area-P 1/2: 4.06 cm2
Calc EF: 46.7 %
MV VTI: 2.08 cm2
P 1/2 time: 6871 ms
S' Lateral: 4 cm
Single Plane A2C EF: 39.1 %
Single Plane A4C EF: 44.6 %

## 2023-11-08 LAB — BASIC METABOLIC PANEL
Anion gap: 11 (ref 5–15)
BUN: 20 mg/dL (ref 8–23)
CO2: 25 mmol/L (ref 22–32)
Calcium: 11.3 mg/dL — ABNORMAL HIGH (ref 8.9–10.3)
Chloride: 105 mmol/L (ref 98–111)
Creatinine, Ser: 1.51 mg/dL — ABNORMAL HIGH (ref 0.44–1.00)
GFR, Estimated: 36 mL/min — ABNORMAL LOW (ref >60)
Glucose, Bld: 120 mg/dL — ABNORMAL HIGH (ref 70–99)
Potassium: 4.7 mmol/L (ref 3.5–5.1)
Sodium: 141 mmol/L (ref 135–145)

## 2023-11-08 MED ORDER — SPIRONOLACTONE 25 MG PO TABS: 12.5000 mg | ORAL_TABLET | Freq: Every day | ORAL | 3 refills | Status: DC

## 2023-11-08 NOTE — Progress Notes (Signed)
Patient ID: Kelli Webster, female   DOB: May 10, 1946, 77 y.o.   MRN: [id number]  PCP: Dr. Andrey Campanile EP: Dr Johney Frame HF MD: Dr Shirlee Latch.   77 y.o. woman with h/o obesity, PAF, moderate OSA (cannot tolerate CPAP) and systolic HF.  She was admitted in 09/2013 with severe dyspnea for several days and was found to have atrial fibrillation with RVR and volume overload.  EF 20-25% on echo, diffuse hypokinesis.   She was cardioverted twice but both times went back into atrial fibrillation, the 2nd time on amiodarone.    She had an atrial fibrillation ablation in 12/14 that was successful.  TEE in 12/14 showed EF 30%.  Repeat echo in 4/15 showed EF up to 45%.  Echo in 6/17 showed EF down to 25-30%.  Echo in 6/18 showed EF back up to 45-50%.  In 3/20 she had recurrent atrial fibrillation and was cardioverted.  In 11/20, echo showed EF 40-45% with normal RV.   She was cardioverted again in 12/21 and 1/22.  In 3/22, she had redo atrial fibrillation ablation.  However, she remained in atypical atrial flutter and Dr. Johney Frame stopped her amiodarone in 8/22.  She had COVID-19 in 8/22.   She was in atypical flutter at follow up 11/22. Started on amiodarone and underwent successful DCCV 11/22.  Echo 12/23 showed EF 40% with diffuse hypokinesis, mildly decreased RV systolic function, moderate functional MR, PASP 54.   In 9/24, she felt her heart start racing again and noted HR up to 170s.  HR was up and down for about 2 days and she felt some lightheadedness.  She came by the office and was noted to be in rapid atrial flutter.  She was sent to the ER.  She was cardioverted back to NSR in the ER.  She was noted to have elevated BNP, HS-TnI, LFTs and creatinine to 3.09 in setting of hypotension and atrial flutter/RVR.  Coreg, losartan, and Marcelline Deist were stopped.     Today she returns for HF follow up. Overall feeling fine. She is not SOB walking up steps, limited mostly by chronic low back pain. Rare palpitations, she  attributes symptoms to increased use of Tylenol. Denies abnormal bleeding, CP, dizziness, edema, or PND/Orthopnea. Appetite ok. No fever or chills. Weight at home 231 pounds. Taking all medications. She has lost 4 lbs in the last month.  Echo today 11/08/23, EF appears similar to previous at 45-50%. Awaiting official MD read.  ECG (personally reviewed): none ordered today.  Labs (8/17): K 4.8, creatinine 1.53, hgb 11.6 Labs (10/17): K 4.7, creatinine 1.37, BNP 74 Labs (2/18): K 4.9, creatinine 1.47, hgb 11.5 Labs (7/18): hgb 11.6, K 4.5, creatinine 1.5 Labs (2/20): K 5, creatinine 1.6 Labs (2/22): K 5, creatinine 1.72 Labs (11/22): K 4.7, creatinine 1.97 Labs (02/2022): K 5.1 Creatinine 1.68 Labs (6/23): K 4.7, creatinine 1.65, LFTs normal, hgb 11.2, TSH normal Labs (9/23): K 4.7, creatinine 1.77, LFTs normal, TSH normal Labs (12/23): K 4.4, creatinine 1.52, LFTs normal, TSH normal Labs (9/24): AST 234, ALT 203, BNP 2317, Hs-TnI 960, TSH normal, creatinine 3.08 Labs (10/24): creatinine 1.55, K 5.3   PMH:  1. Obesity 2. GERD 3. OA 4. EGD negative in 10/14 5. ACEI cough 6. Atrial fibrillation: First diagnosed in 10/14.  DCCV to NSR in 09/2013 but atrial fibrillation recurred. Cardioversion 10/18/2013 successful on amiodarone but back in atrial fibrillation in 11/14.  Atrial fibrillation ablation ([facility]) 12/14.  - DCCV to NSR in 3/20, 12/21, 1/22 -  Redo atrial fibrillation ablation in 3/22.   7. Cardiomyopathy: Echo (10/14) with EF 20-25%, diffuse HK.  LHC (10/14) showed no CAD.  TSH normal, HIV negative, SPEP/UPEP negative.  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  TEE (12/14) with EF 30%, diffuse hypokinesis, mild MR, mildly decreased RV systolic function.  Echo (4/15) with EF 45%, diffuse hypokinesis, mildly dilated LV, normal RV size and systolic function.  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 - Echo (6/17) with EF 25-30%, mild MR.  - Echo (6/18) with EF 45-50%, mildly dilated LV, mild  MR.  - Echo (11/20) with EF 40-45%, normal RV.  - Echo (11/22): EF 35-40%, RV normal, mild MR.  - Echo (12/23): EF 40% with diffuse hypokinesis, mildly decreased RV systolic function, moderate functional MR, PASP 54, IVC normal.  8. Sinus bradycardia 9. OSA: Cannot tolerate CPAP. 10. CKD stage 3 11. Low back pain/sciatica.  12. COVID-19 8/22 13. Fe deficiency anemia 14. Atrial flutter: Atypical.  DCCV in 9/24.   SH: Married, lives in [place], nonsmoker.   FH: No history of cardiomyopathy or premature CAD.   ROS: All systems reviewed and negative except as per HPI.   Current Outpatient Medications  Medication Sig Dispense Refill   acetaminophen (TYLENOL) 325 MG tablet Take 325-650 mg by mouth every 6 (six) hours as needed for moderate pain or headache.     amiodarone (PACERONE) 200 MG tablet Take 1 tablet (200 mg total) by mouth daily. 90 tablet 6   carvedilol (COREG) 3.125 MG tablet Take 1 tablet (3.125 mg total) by mouth 2 (two) times daily. 180 tablet 3   ELIQUIS 5 MG TABS tablet TAKE 1 TABLET BY MOUTH TWICE  DAILY 200 tablet 2   famotidine (PEPCID) 20 MG tablet Take 20 mg by mouth daily as needed for heartburn or indigestion.     FARXIGA 10 MG TABS tablet Take 1 tablet (10 mg total) by mouth daily before breakfast. 30 tablet 11   lactobacillus acidophilus (BACID) TABS tablet Take 1 tablet by mouth every other day.     losartan (COZAAR) 25 MG tablet Take 1 tablet (25 mg total) by mouth daily. 90 tablet 3   No current facility-administered medications for this encounter.   BP (!) 142/80   Pulse 73   Wt 109.7 kg (241 lb 12.8 oz)   SpO2 96%   BMI 42.16 kg/m   Wt Readings from Last 3 Encounters:  11/08/23 109.7 kg (241 lb 12.8 oz)  10/17/23 110.8 kg (244 lb 3.2 oz)  09/27/23 111.6 kg (246 lb)   Physical Exam General:  NAD. No resp difficulty, walked into clinic HEENT: Normal Neck: Supple. No JVD, thick neck. Carotids 2+ bilat; no bruits. No lymphadenopathy or  thryomegaly appreciated. Cor: PMI nondisplaced. Regular rate & rhythm. No rubs, gallops or murmurs. Lungs: Clear Abdomen: Soft, obese, nontender, nondistended. No hepatosplenomegaly. No bruits or masses. Good bowel sounds. Extremities: No cyanosis, clubbing, rash, edema Neuro: Alert & oriented x 3, cranial nerves grossly intact. Moves all 4 extremities w/o difficulty. Affect pleasant.  Assessment/Plan: 1. 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 Echo 6/17 EF 25-30%, Echo 9/20 with EF 40-45%, echo 11/22 showed EF 35-40% (in setting of atrial flutter).  Echo 12/23 showed EF still low at 40% so suspect her cardiomyopathy is not only tachycardia-mediated. She was off most of her GDMT 9/24 when she presented to [facility] with atrial flutter/RVR for several days and hypotension/AKI. BP now stable, creatinine seems to be trending back  to baseline (around 1.5).  She is not volume overloaded on exam.  NYHA class II symptoms.   - Re-start spiro 12.5 mg daily. BMET today, repeat in 1 week - Continue Coreg 3.125 mg bid.  - Continue Farxiga 10 mg daily.  - Continue losartan (did not tolerate Entresto due to lightheadedness and has not wanted to retry).  - She does not appear to need Lasix.  - She feels like she is too claustrophobic to do cardiac MRI.  - EF is out of ICD range.  - Echo today 11/08/23, EF appears stable compared to previous. Awaiting formal MD read 2. Atrial fibrillation/flutter: She developed atypical atrial flutter after redo atrial fibrillation ablation in 3/22.  Amiodarone was stopped in 8/22. Back in atypical aflutter 11/22. Amiodarone restarted and underwent successful DCCV. She had maintained NSR until 9/24 when she had atypical atrial flutter again with RVR and hypotension/AKI.  She had DCCV in 9/24 and is regular on exam today.  Amiodarone increased from 100 mg daily to 200 mg daily.  - Continue amiodarone 200 mg daily.  She had elevated LFTs in  9/24 were due to shock liver with hypotension and not amiodarone.  Most recent LFTs have normalized. TSH recently normal.  Will need regular eye exam.  - Continue apixaban 5 mg bid. No bleeding issues. - She had a severe decompensation with atrial flutter in 9/24.  Would like her evaluated by EP again to consider atrial flutter ablation.  If we can ablate her, may be able to take her off amiodarone. She has follow up next month with EP. 3. OSA: She has been diagnosed in the past but cannot tolerate CPAP.  4. Obesity: Body mass index is 42.16 kg/m. - She has been referred to the pharmacy clinic for semaglutide.  Follow up in 3-4 months with Dr. Kathreen Cornfield [facility] FNP-BC 11/08/2023

## 2023-11-08 NOTE — Progress Notes (Signed)
*  PRELIMINARY RESULTS* Echocardiogram 2D Echocardiogram has been performed.  Kelli Webster 11/08/2023, 2:48 PM

## 2023-11-08 NOTE — Patient Instructions (Addendum)
Thank you for coming in today  If you had labs drawn today, any labs that are abnormal the clinic will call you No news is good news  Medications: RESTART Spironolactone 12.5 mg 1/2 tablet daily   Follow up appointments: Your physician recommends that you return for lab work in: 1 week for BMET  Your physician recommends that you schedule a follow-up appointment in:  3-4 months With Dr. Earlean Shawl will receive a reminder letter in the mail a few months in advance. If you don't receive a letter, please call our office to schedule the follow-up appointment.    Do the following things EVERYDAY: Weigh yourself in the morning before breakfast. Write it down and keep it in a log. Take your medicines as prescribed Eat low salt foods--Limit salt (sodium) to 2000 mg per day.  Stay as active as you can everyday Limit all fluids for the day to less than 2 liters   At the [facility] Clinic, you and your health needs are our priority. As part of our continuing mission to provide you with exceptional heart care, we have created designated Provider Care Teams. These Care Teams include your primary Cardiologist (physician) and Advanced Practice Providers (APPs- Physician Assistants and Nurse Practitioners) who all work together to provide you with the care you need, when you need it.   You may see any of the following providers on your designated Care Team at your next follow up: Dr Arvilla Meres Dr [name] Dr. Marcos Eke, NP Robbie Lis, [place] [facility] [place], [place] Brynda Peon, NP Karle Plumber, PharmD   Please be sure to bring in all your medications bottles to every appointment.    Thank you for choosing [facility]  If you have any questions or concerns before your next appointment please send [place] a message through [place] or call our office at [phone].    TO LEAVE A MESSAGE FOR THE NURSE  SELECT OPTION 2, PLEASE LEAVE A MESSAGE INCLUDING: YOUR NAME DATE OF BIRTH CALL BACK NUMBER REASON FOR CALL**this is important as we prioritize the call backs  YOU WILL RECEIVE A CALL BACK THE SAME DAY AS LONG AS YOU CALL BEFORE 4:00 PM

## 2023-11-20 ENCOUNTER — Ambulatory Visit (HOSPITAL_COMMUNITY)
Admission: RE | Admit: 2023-11-20 | Discharge: 2023-11-20 | Disposition: A | Discharge status: Home / Self Care | Payer: Medicare Other | Source: Ambulatory Visit | Attending: Cardiology | Admitting: Cardiology

## 2023-11-20 DIAGNOSIS — I5022 Chronic systolic (congestive) heart failure: Secondary | ICD-10-CM | POA: Insufficient documentation

## 2023-11-20 LAB — BASIC METABOLIC PANEL
Anion gap: 7 (ref 5–15)
BUN: 28 mg/dL — ABNORMAL HIGH (ref 8–23)
CO2: 26 mmol/L (ref 22–32)
Calcium: 11.3 mg/dL — ABNORMAL HIGH (ref 8.9–10.3)
Chloride: 108 mmol/L (ref 98–111)
Creatinine, Ser: 1.58 mg/dL — ABNORMAL HIGH (ref 0.44–1.00)
GFR, Estimated: 34 mL/min — ABNORMAL LOW (ref >60)
Glucose, Bld: 122 mg/dL — ABNORMAL HIGH (ref 70–99)
Potassium: 5 mmol/L (ref 3.5–5.1)
Sodium: 141 mmol/L (ref 135–145)

## 2023-11-21 ENCOUNTER — Institutional Professional Consult (permissible substitution): Payer: Medicare Other | Admitting: Cardiology

## 2023-12-26 ENCOUNTER — Other Ambulatory Visit (HOSPITAL_COMMUNITY): Payer: Self-pay | Admitting: Cardiology

## 2024-01-02 ENCOUNTER — Institutional Professional Consult (permissible substitution): Payer: Medicare Other | Admitting: Cardiology

## 2024-01-11 ENCOUNTER — Other Ambulatory Visit (HOSPITAL_COMMUNITY): Payer: Self-pay | Admitting: Family Medicine

## 2024-01-11 DIAGNOSIS — M545 Low back pain, unspecified: Secondary | ICD-10-CM

## 2024-01-11 DIAGNOSIS — S32040A Wedge compression fracture of fourth lumbar vertebra, initial encounter for closed fracture: Secondary | ICD-10-CM

## 2024-01-16 ENCOUNTER — Institutional Professional Consult (permissible substitution): Payer: Medicare Other | Admitting: Cardiology

## 2024-01-18 ENCOUNTER — Ambulatory Visit (HOSPITAL_COMMUNITY)
Admission: RE | Admit: 2024-01-18 | Discharge: 2024-01-18 | Disposition: A | Discharge status: Home / Self Care | Payer: Medicare Other | Source: Ambulatory Visit | Attending: Family Medicine | Admitting: Family Medicine

## 2024-01-18 DIAGNOSIS — S32040A Wedge compression fracture of fourth lumbar vertebra, initial encounter for closed fracture: Secondary | ICD-10-CM | POA: Diagnosis present

## 2024-01-18 DIAGNOSIS — M545 Low back pain, unspecified: Secondary | ICD-10-CM | POA: Insufficient documentation

## 2024-03-04 ENCOUNTER — Telehealth (HOSPITAL_COMMUNITY): Payer: Self-pay | Admitting: Cardiology

## 2024-04-02 ENCOUNTER — Other Ambulatory Visit (HOSPITAL_COMMUNITY): Payer: Self-pay | Admitting: Cardiology

## 2024-04-06 ENCOUNTER — Other Ambulatory Visit (HOSPITAL_COMMUNITY): Payer: Self-pay | Admitting: Cardiology

## 2024-05-23 ENCOUNTER — Telehealth (HOSPITAL_COMMUNITY): Payer: Self-pay | Admitting: Cardiology

## 2024-05-30 ENCOUNTER — Telehealth (HOSPITAL_COMMUNITY): Payer: Self-pay | Admitting: Cardiology

## 2024-07-10 ENCOUNTER — Other Ambulatory Visit (HOSPITAL_COMMUNITY): Payer: Self-pay | Admitting: Endocrinology

## 2024-07-10 DIAGNOSIS — E210 Primary hyperparathyroidism: Secondary | ICD-10-CM

## 2024-07-17 ENCOUNTER — Telehealth (HOSPITAL_COMMUNITY): Payer: Self-pay | Admitting: Cardiology

## 2024-07-26 ENCOUNTER — Encounter (HOSPITAL_COMMUNITY)
Admission: RE | Admit: 2024-07-26 | Discharge: 2024-07-26 | Disposition: A | Discharge status: Home / Self Care | Source: Ambulatory Visit | Attending: Endocrinology | Admitting: Endocrinology

## 2024-07-26 DIAGNOSIS — E210 Primary hyperparathyroidism: Secondary | ICD-10-CM | POA: Diagnosis present

## 2024-07-26 MED ORDER — TECHNETIUM TC 99M SESTAMIBI - CARDIOLITE
26.0000 | Freq: Once | INTRAVENOUS | Status: AC | PRN
  Administered 2024-07-26: 26 via INTRAVENOUS

## 2024-08-20 ENCOUNTER — Other Ambulatory Visit: Payer: Self-pay | Admitting: Surgery

## 2024-08-20 DIAGNOSIS — E210 Primary hyperparathyroidism: Secondary | ICD-10-CM

## 2024-08-23 ENCOUNTER — Ambulatory Visit
Admission: RE | Admit: 2024-08-23 | Discharge: 2024-08-23 | Disposition: A | Discharge status: Home / Self Care | Source: Ambulatory Visit | Attending: Surgery | Admitting: Surgery

## 2024-08-23 DIAGNOSIS — E210 Primary hyperparathyroidism: Secondary | ICD-10-CM

## 2024-08-27 ENCOUNTER — Other Ambulatory Visit: Payer: Self-pay | Admitting: Surgery

## 2024-08-27 DIAGNOSIS — E210 Primary hyperparathyroidism: Secondary | ICD-10-CM

## 2024-08-29 ENCOUNTER — Ambulatory Visit
Admission: RE | Admit: 2024-08-29 | Discharge: 2024-08-29 | Disposition: A | Discharge status: Home / Self Care | Source: Ambulatory Visit | Attending: Surgery | Admitting: Surgery

## 2024-08-29 DIAGNOSIS — E210 Primary hyperparathyroidism: Secondary | ICD-10-CM

## 2024-08-29 MED ORDER — IOPAMIDOL (ISOVUE-370) INJECTION 76%
100.0000 mL | Freq: Once | INTRAVENOUS | Status: AC | PRN
  Administered 2024-08-29: 100 mL via INTRAVENOUS

## 2024-09-03 ENCOUNTER — Ambulatory Visit: Payer: Self-pay | Admitting: Surgery

## 2024-09-03 NOTE — Progress Notes (Signed)
 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  Will plan to proceed with minimally invasive parathyroidectomy as an outpatient procedure as discussed in the office.  I will enter orders and send to schedulers.  Patient will need cardiology clearance prior to scheduling surgery.  She will need to hold anticoagulation as well.  Krystal Spinner, MD [facility] practice Office: [phone]

## 2024-09-04 ENCOUNTER — Telehealth: Payer: Self-pay

## 2024-09-04 NOTE — Telephone Encounter (Signed)
   Pre-operative Risk Assessment    Patient Name: Kelli Webster  DOB: 1946-05-16 MRN: [id number]   Date of last office visit: 07/26/21 Date of next office visit:    Request for Surgical Clearance    Procedure:  Parathyroidectomy  Date of Surgery:  Clearance TBD                                 Surgeon:  Krystal Spinner, MD Surgeon's Group or Practice Name:  [facility] Phone number:  [phone] Fax number:  [phone]   Type of Clearance Requested:   - Medical  - Pharmacy:  Hold Apixaban  (Eliquis )     Type of Anesthesia:  General    Additional requests/questions:  N/A  Signed, Merlynn LITTIE Essex   09/04/2024, 11:49 AM

## 2024-09-04 NOTE — Telephone Encounter (Signed)
   Name: Kelli Webster  DOB: March 21, 1946  MRN: [id number]  Primary Cardiologist: None  Chart reviewed as part of pre-operative protocol coverage. The patient has an upcoming visit scheduled with Dr. Rolan on 09/19/2024 at which time clearance can be addressed in case there are any issues that would impact surgical recommendations.  Parathyroidectomy is not scheduled until TBD as below. I added preop FYI to appointment note so that provider is aware to address at time of outpatient visit.  Per office protocol the cardiology provider should forward their finalized clearance decision and recommendations regarding antiplatelet therapy to the requesting party below.    This message will also be routed to pharmacy pool for input on holding Eliquis  as requested below so that this information is available to the clearing provider at time of patient's appointment.   I will route this message as FYI to requesting party and remove this message from the preop box as separate preop APP input not needed at this time.   Please call with any questions.  Damien JAYSON Braver, NP  09/04/2024, 12:04 PM

## 2024-09-06 NOTE — Telephone Encounter (Signed)
 Patient with diagnosis of afib on Eliquis  for anticoagulation.    Procedure:   Parathyroidectomy  Date of procedure: TBD   CHA2DS2-VASc Score = 4   This indicates a 4.8% annual risk of stroke. The patient's score is based upon: CHF History: 1 HTN History: 0 Diabetes History: 0 Stroke History: 0 Vascular Disease History: 0 Age Score: 2 Gender Score: 1      CrCl 35 ml/min Platelet count 349  Patient has not had an Afib/aflutter ablation or Watchman within the last 3 months or DCCV within the last 30 days   Per office protocol, patient can hold Eliquis  for 2 days prior to procedure.    **This guidance is not considered finalized until pre-operative APP has relayed final recommendations.**

## 2024-09-14 ENCOUNTER — Other Ambulatory Visit (HOSPITAL_COMMUNITY): Payer: Self-pay | Admitting: Cardiology

## 2024-09-19 ENCOUNTER — Ambulatory Visit (HOSPITAL_COMMUNITY)
Admission: RE | Admit: 2024-09-19 | Discharge: 2024-09-19 | Disposition: A | Discharge status: Home / Self Care | Source: Ambulatory Visit | Attending: Cardiology | Admitting: Cardiology

## 2024-09-19 VITALS — BP 104/60 | HR 65 | Wt 211.0 lb

## 2024-09-19 DIAGNOSIS — I480 Paroxysmal atrial fibrillation: Secondary | ICD-10-CM | POA: Diagnosis not present

## 2024-09-19 DIAGNOSIS — G4733 Obstructive sleep apnea (adult) (pediatric): Secondary | ICD-10-CM | POA: Diagnosis not present

## 2024-09-19 DIAGNOSIS — E213 Hyperparathyroidism, unspecified: Secondary | ICD-10-CM | POA: Diagnosis not present

## 2024-09-19 DIAGNOSIS — I428 Other cardiomyopathies: Secondary | ICD-10-CM | POA: Insufficient documentation

## 2024-09-19 DIAGNOSIS — I484 Atypical atrial flutter: Secondary | ICD-10-CM | POA: Diagnosis not present

## 2024-09-19 DIAGNOSIS — Z7984 Long term (current) use of oral hypoglycemic drugs: Secondary | ICD-10-CM | POA: Insufficient documentation

## 2024-09-19 DIAGNOSIS — Z7901 Long term (current) use of anticoagulants: Secondary | ICD-10-CM | POA: Insufficient documentation

## 2024-09-19 DIAGNOSIS — Z6836 Body mass index (BMI) 36.0-36.9, adult: Secondary | ICD-10-CM | POA: Insufficient documentation

## 2024-09-19 DIAGNOSIS — I5022 Chronic systolic (congestive) heart failure: Secondary | ICD-10-CM | POA: Insufficient documentation

## 2024-09-19 DIAGNOSIS — M549 Dorsalgia, unspecified: Secondary | ICD-10-CM | POA: Insufficient documentation

## 2024-09-19 DIAGNOSIS — E669 Obesity, unspecified: Secondary | ICD-10-CM | POA: Diagnosis not present

## 2024-09-19 LAB — COMPREHENSIVE METABOLIC PANEL WITH GFR
ALT: 18 U/L (ref 0–44)
AST: 23 U/L (ref 15–41)
Albumin: 3.4 g/dL — ABNORMAL LOW (ref 3.5–5.0)
Alkaline Phosphatase: 62 U/L (ref 38–126)
Anion gap: 12 (ref 5–15)
BUN: 26 mg/dL — ABNORMAL HIGH (ref 8–23)
CO2: 23 mmol/L (ref 22–32)
Calcium: 11.4 mg/dL — ABNORMAL HIGH (ref 8.9–10.3)
Chloride: 103 mmol/L (ref 98–111)
Creatinine, Ser: 1.86 mg/dL — ABNORMAL HIGH (ref 0.44–1.00)
GFR, Estimated: 28 mL/min — ABNORMAL LOW (ref >60)
Glucose, Bld: 131 mg/dL — ABNORMAL HIGH (ref 70–99)
Potassium: 4.3 mmol/L (ref 3.5–5.1)
Sodium: 138 mmol/L (ref 135–145)
Total Bilirubin: 0.8 mg/dL (ref 0.0–1.2)
Total Protein: 5.9 g/dL — ABNORMAL LOW (ref 6.5–8.1)

## 2024-09-19 LAB — BRAIN NATRIURETIC PEPTIDE: B Natriuretic Peptide: 280 pg/mL — ABNORMAL HIGH (ref 0.0–100.0)

## 2024-09-19 LAB — TSH: TSH: 2.457 u[IU]/mL (ref 0.350–4.500)

## 2024-09-19 MED ORDER — AMIODARONE HCL 200 MG PO TABS: ORAL_TABLET | ORAL | Status: DC

## 2024-09-19 MED ORDER — SPIRONOLACTONE 25 MG PO TABS: 25.0000 mg | ORAL_TABLET | Freq: Every day | ORAL | 3 refills | Status: AC

## 2024-09-19 NOTE — Patient Instructions (Addendum)
 Medication Changes:  INCREASE Spironolactone  to 25 mg Daily  INCREASE Amiodarone  to 200 mg Twice daily FOR 1 WEEK ONLY, then back to 200 mg Daily  Lab Work:  Labs done today, your results will be available in MyChart, we will contact you for abnormal readings.  Your physician recommends that you return for lab work in: 1 week  Testing/Procedures:  EKG in 1 week   Your physician has requested that you have an echocardiogram. Echocardiography is a painless test that uses sound waves to create images of your heart. It provides your doctor with information about the size and shape of your heart and how well your heart's chambers and valves are working. This procedure takes approximately one hour. There are no restrictions for this procedure. Please do NOT wear cologne, perfume, aftershave, or lotions (deodorant is allowed). Please arrive 15 minutes prior to your appointment time.  Please note: We ask at that you not bring children with you during ultrasound (echo/ vascular) testing. Due to room size and safety concerns, children are not allowed in the ultrasound rooms during exams. Our front office staff cannot provide observation of children in our lobby area while testing is being conducted. An adult accompanying a patient to their appointment will only be allowed in the ultrasound room at the discretion of the ultrasound technician under special circumstances. We apologize for any inconvenience.   Special Instructions // Education:  Do the following things EVERYDAY: Weigh yourself in the morning before breakfast. Write it down and keep it in a log. Take your medicines as prescribed Eat low salt foods--Limit salt (sodium) to 2000 mg per day.  Stay as active as you can everyday Limit all fluids for the day to less than 2 liters   Follow-Up in: 1 month   At the [facility], you and your health needs are our priority. We have a designated team specialized in the  treatment of Heart Failure. This Care Team includes your primary Heart Failure Specialized Cardiologist (physician), Advanced Practice Providers (APPs- Physician Assistants and Nurse Practitioners), and Pharmacist who all work together to provide you with the care you need, when you need it.   You may see any of the following providers on your designated Care Team at your next follow up:  Dr. Toribio Fuel Dr. Ezra Shuck Dr. Ria Commander Dr. Odis Brownie Greig Mosses, NP Caffie Shed, [place] [facility] [place], [place] Beckey Coe, NP [place] Lee, NP Tinnie Redman, PharmD   Please be sure to bring in all your medications bottles to every appointment.   Need to Contact Us :  If you have any questions or concerns before your next appointment please send us  a message through [place] or call our office at [phone].    TO LEAVE A MESSAGE FOR THE NURSE SELECT OPTION 2, PLEASE LEAVE A MESSAGE INCLUDING: YOUR NAME DATE OF BIRTH CALL BACK NUMBER REASON FOR CALL**this is important as we prioritize the call backs  YOU WILL RECEIVE A CALL BACK THE SAME DAY AS LONG AS YOU CALL BEFORE 4:00 PM

## 2024-09-20 ENCOUNTER — Ambulatory Visit (HOSPITAL_COMMUNITY): Payer: Self-pay | Admitting: Cardiology

## 2024-09-20 NOTE — Progress Notes (Signed)
 Patient ID: Kelli Webster, female   DOB: [birth year], 78 y.o.   MRN: [id number]  PCP: Dr. Tanda EP: Dr Kelsie HF MD: Dr Rolan.   Chief complaint: CHF  78 y.o. woman with h/o obesity, PAF, moderate OSA (cannot tolerate CPAP) and systolic HF.  She was admitted in 09/2013 with severe dyspnea for several days and was found to have atrial fibrillation with RVR and volume overload.  EF 20-25% on echo, diffuse hypokinesis.   She was cardioverted twice but both times went back into atrial fibrillation, the 2nd time on amiodarone .    She had an atrial fibrillation ablation in 12/14 that was successful.  TEE in 12/14 showed EF 30%.  Repeat echo in 4/15 showed EF up to 45%.  Echo in 6/17 showed EF down to 25-30%.  Echo in 6/18 showed EF back up to 45-50%.  In 3/20 she had recurrent atrial fibrillation and was cardioverted.  In 11/20, echo showed EF 40-45% with normal RV.   She was cardioverted again in 12/21 and 1/22.  In 3/22, she had redo atrial fibrillation ablation.  However, she remained in atypical atrial flutter and Dr. Kelsie stopped her amiodarone  in 8/22.  She had COVID-19 in 8/22.   She was in atypical flutter at follow up 11/22. Started on amiodarone  and underwent successful DCCV 11/22.  Echo 12/23 showed EF 40% with diffuse hypokinesis, mildly decreased RV systolic function, moderate functional MR, PASP 54.   In 9/24, she felt her heart start racing again and noted HR up to 170s.  HR was up and down for about 2 days and she felt some lightheadedness.  She came by the office and was noted to be in rapid atrial flutter.  She was sent to the ER.  She was cardioverted back to NSR in the ER.  She was noted to have elevated BNP, HS-TnI, LFTs and creatinine to 3.09 in setting of hypotension and atrial flutter/RVR.  Coreg , losartan , and Farxiga  were stopped.     11/24 echo showed EF 40-45%, normal RV, mild MR.   Today she returns for HF follow up. We have not seen her since last year.  She is in  atrial fibrillation today.  She is not sure how long she has been in atrial fibrillation (does not feel palpitations).  She uses a walker because of back pain but denies dyspnea walking on flat ground for a short distance.  She fatigues very easily.  No orthopnea/PND.  No chest pain. She has been diagnosed with hyperparathyroidism and needs parathyroidectomy. Of note, her weight is down 31 lbs since last visit to this office.   ECG (personally reviewed): Atrial fibrillation, right superior axis, IVCD 164 msec  Labs (9/24): AST 234, ALT 203, BNP 2317, Hs-TnI 960, TSH normal, creatinine 3.08 Labs (10/24): creatinine 1.55, K 5.3 Labs (12/24): K 5, creatinine 1.58   PMH:  1. Obesity 2. GERD 3. OA 4. EGD negative in 10/14 5. ACEI cough 6. Atrial fibrillation: First diagnosed in 10/14.  DCCV to NSR in 09/2013 but atrial fibrillation recurred. Cardioversion 10/18/2013 successful on amiodarone  but back in atrial fibrillation in 11/14.  Atrial fibrillation ablation ([facility]) 12/14.  - DCCV to NSR in 3/20, 12/21, 1/22 - Redo atrial fibrillation ablation in 3/22.   7. Cardiomyopathy: Echo (10/14) with EF 20-25%, diffuse HK.  LHC (10/14) showed no CAD.  TSH normal, HIV negative, SPEP/UPEP negative.  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  TEE (12/14) with EF 30%, diffuse hypokinesis, mild MR, mildly decreased RV  systolic function.  Echo (4/15) with EF 45%, diffuse hypokinesis, mildly dilated LV, normal RV size and systolic function.  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 - Echo (6/17) with EF 25-30%, mild MR.  - Echo (6/18) with EF 45-50%, mildly dilated LV, mild MR.  - Echo (11/20) with EF 40-45%, normal RV.  - Echo (11/22): EF 35-40%, RV normal, mild MR.  - Echo (12/23): EF 40% with diffuse hypokinesis, mildly decreased RV systolic function, moderate functional MR, PASP 54, IVC normal.  - Echo (11/24): EF 40-45%, RV normal, mild MR.  8. Sinus bradycardia 9. OSA: Cannot tolerate CPAP. 10. CKD stage  3 11. Low back pain/sciatica.  12. COVID-19 8/22 13. Fe deficiency anemia 14. Atrial flutter: Atypical.  DCCV in 9/24.  15. Hyperparathyroidism  SH: Married, lives in [place], nonsmoker.   FH: No history of cardiomyopathy or premature CAD.   ROS: All systems reviewed and negative except as per HPI.   Current Outpatient Medications  Medication Sig Dispense Refill   acetaminophen  (TYLENOL ) 325 MG tablet Take 325-650 mg by mouth every 6 (six) hours as needed for moderate pain or headache.     carvedilol  (COREG ) 3.125 MG tablet TAKE 1 TABLET BY MOUTH 2 TIMES DAILY. 180 tablet 3   ELIQUIS  5 MG TABS tablet TAKE 1 TABLET BY MOUTH TWICE  DAILY 180 tablet 3   famotidine  (PEPCID ) 20 MG tablet Take 20 mg by mouth daily as needed for heartburn or indigestion.     FARXIGA  10 MG TABS tablet Take 1 tablet (10 mg total) by mouth daily before breakfast. 30 tablet 11   lactobacillus acidophilus (BACID) TABS tablet Take 1 tablet by mouth every other day.     losartan  (COZAAR ) 25 MG tablet Take 1 tablet (25 mg total) by mouth daily. 90 tablet 3   amiodarone  (PACERONE ) 200 MG tablet Take 200 mg Twice daily for 1 week then 200 mg Daily     spironolactone  (ALDACTONE ) 25 MG tablet Take 1 tablet (25 mg total) by mouth daily. 90 tablet 3   No current facility-administered medications for this encounter.   BP 104/60   Pulse 65   Wt 95.7 kg (211 lb)   SpO2 100%   BMI 36.79 kg/m   Wt Readings from Last 3 Encounters:  09/19/24 95.7 kg (211 lb)  11/08/23 109.7 kg (241 lb 12.8 oz)  10/17/23 110.8 kg (244 lb 3.2 oz)   General: NAD Neck: JVP 8 cm, no thyromegaly or thyroid  nodule.  Lungs: Clear to auscultation bilaterally with normal respiratory effort. CV: Nondisplaced PMI.  Heart irregular S1/S2, no S3/S4, no murmur.  No peripheral edema.  No carotid bruit.  Normal pedal pulses.  Abdomen: Soft, nontender, no hepatosplenomegaly, no distention.  Skin: Intact without lesions or rashes.  Neurologic:  Alert and oriented x 3.  Psych: Normal affect. Extremities: No clubbing or cyanosis.  HEENT: Normal.   Assessment/Plan: 1. 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 Echo 6/17 EF 25-30%, Echo 9/20 with EF 40-45%, echo 11/22 showed EF 35-40% (in setting of atrial flutter).  Echo 12/23 showed EF still low at 40% so suspect her cardiomyopathy is not only tachycardia-mediated. Echo in 11/24 with EF 40-45%. She is not significantly volume overloaded on exam.  NYHA class II-III symptoms, primarily fatigue.  - Increase spironolactone  to 25 mg daily.  BMET/BNP today and BMET in 10 days.  - Continue Coreg  3.125 mg bid.  - Continue Farxiga  10 mg daily.  -  Continue losartan  25 mg daily (did not tolerate Entresto  due to lightheadedness and has not wanted to retry).  - She feels like she is too claustrophobic to do a cardiac MRI.  - EF has been out of ICD range.  - I will get a repeat echo, will try to do this before her parathyroidectomy.  2. Atrial fibrillation/flutter: She developed atypical atrial flutter after redo atrial fibrillation ablation in 3/22.  Amiodarone  was stopped in 8/22. Back in atypical aflutter 11/22. Amiodarone  restarted and underwent successful DCCV. She had maintained NSR until 9/24 when she had atypical atrial flutter again with RVR and hypotension/AKI.  She had DCCV in 9/24.  She is back in atrial fibrillation today, I am not sure how long.   - Increase amiodarone  to 200 mg bid x 1 week then back to 200 mg daily.  Check LFTs/TSH today, she will need a regular eye exam.  - Continue apixaban  5 mg bid.  - Refer back to EP to consider redo ablation.  - Followup in 1 week for ECG.  If still in AF, she will need DCCV.  3. OSA: She has been diagnosed in the past but cannot tolerate CPAP.  4. Obesity: Body mass index is 36.79 kg/m. - She has been losing weight via dietary changes.  5. Hyperparathyroidism: Planned for parathyroidectomy.  Low risk  surgery, I think she would be ok to do this.  Will get echo before surgery.  If she does not go back into NSR on her own, we will plan to do her cardioversion after her surgery (want to avoid stopping anticoagulation after DCCV).   Follow up in 1 month  I spent 42 minutes reviewing records and LVAD parameters, interviewing/examining patient, and managing orders.   Ezra Shuck  09/20/2024

## 2024-09-23 NOTE — Progress Notes (Signed)
 Sent message, via epic in basket, requesting orders in epic from Careers adviser.

## 2024-09-26 ENCOUNTER — Ambulatory Visit (HOSPITAL_COMMUNITY)
Admission: RE | Admit: 2024-09-26 | Discharge: 2024-09-26 | Disposition: A | Discharge status: Home / Self Care | Source: Ambulatory Visit | Attending: Cardiology | Admitting: Cardiology

## 2024-09-26 DIAGNOSIS — I4891 Unspecified atrial fibrillation: Secondary | ICD-10-CM | POA: Diagnosis not present

## 2024-09-26 DIAGNOSIS — I5022 Chronic systolic (congestive) heart failure: Secondary | ICD-10-CM

## 2024-09-26 DIAGNOSIS — Z01818 Encounter for other preprocedural examination: Secondary | ICD-10-CM | POA: Insufficient documentation

## 2024-09-26 DIAGNOSIS — Z01812 Encounter for preprocedural laboratory examination: Secondary | ICD-10-CM | POA: Diagnosis present

## 2024-09-26 DIAGNOSIS — Z01810 Encounter for preprocedural cardiovascular examination: Secondary | ICD-10-CM | POA: Diagnosis present

## 2024-09-26 LAB — BASIC METABOLIC PANEL WITH GFR
Anion gap: 10 (ref 5–15)
BUN: 23 mg/dL (ref 8–23)
CO2: 23 mmol/L (ref 22–32)
Calcium: 11.3 mg/dL — ABNORMAL HIGH (ref 8.9–10.3)
Chloride: 106 mmol/L (ref 98–111)
Creatinine, Ser: 1.7 mg/dL — ABNORMAL HIGH (ref 0.44–1.00)
GFR, Estimated: 31 mL/min — ABNORMAL LOW (ref >60)
Glucose, Bld: 142 mg/dL — ABNORMAL HIGH (ref 70–99)
Potassium: 4.4 mmol/L (ref 3.5–5.1)
Sodium: 139 mmol/L (ref 135–145)

## 2024-09-26 NOTE — Patient Instructions (Signed)
    Dear Kelli Webster  You are scheduled for a TEE (Transesophageal Echocardiogram) Guided Cardioversion on Wednesday, October 29 with Dr. Rolan.  Please arrive at the [facility] (Main Entrance A) at [facility]: [address] [place], [place] [address] at 7:00 AM (This time is 1 hour(s) before your procedure to ensure your preparation).   Free valet parking service is available. You will check in at ADMITTING.   *Please Note: You will receive a call the day before your procedure to confirm the appointment time. That time may have changed from the original time based on the schedule for that day.*    DIET:  Nothing to eat or drink after midnight except a sip of water with medications (see medication instructions below)  MEDICATION INSTRUCTIONS: !!IF ANY NEW MEDICATIONS ARE STARTED AFTER TODAY, PLEASE NOTIFY YOUR PROVIDER AS SOON AS POSSIBLE!!  FYI: Medications such as Semaglutide (Ozempic, [place]), Tirzepatide (Mounjaro, Zepbound), Dulaglutide (Trulicity), etc (GLP1 agonists) AND Canagliflozin (Invokana), Dapagliflozin  (Farxiga ), Empagliflozin (Jardiance), Ertugliflozin (Steglatro), Bexagliflozin [employer]) or any combination with one of these drugs such as Invokamet (Canagliflozin/Metformin), Synjardy (Empagliflozin/Metformin), etc (SGLT2 inhibitors) must be held around the time of a procedure. This is not a comprehensive list of all of these drugs. Please review all of your medications and talk to your provider if you take any one of these. If you are not sure, ask your provider.   HOLD: Dapagliflozin  (Farxiga ) for 3 days prior to the procedure. Last dose on Saturday, October 25.  Continue taking your anticoagulant (blood thinner): Apixaban  (Eliquis ).  You will need to continue this after your procedure until you are told by your provider that it is safe to stop.    LABS:  Labs done 09/26/2024  FYI:  For your safety, and to allow us  to monitor your vital signs accurately  during the surgery/procedure we request: If you have artificial nails, gel coating, SNS etc, please have those removed prior to your surgery/procedure. Not having the nail coverings /polish removed may result in cancellation or delay of your surgery/procedure.  Your support person will be asked to wait in the waiting room during your procedure.  It is OK to have someone drop you off and come back when you are ready to be discharged.  You cannot drive after the procedure and will need someone to drive you home.  Bring your insurance cards.  *Special Note: Every effort is made to have your procedure done on time. Occasionally there are emergencies that occur at the hospital that may cause delays. Please be patient if a delay does occur.

## 2024-09-26 NOTE — Progress Notes (Signed)
 Your physician has recommended that you have a Cardioversion (DCCV). Electrical Cardioversion uses a jolt of electricity to your heart either through paddles or wired patches attached to your chest. This is a controlled, usually prescheduled, procedure. Defibrillation is done under light anesthesia in the hospital, and you usually go home the day of the procedure. This is done to get your heart back into a normal rhythm. You are not awake for the procedure. Please see the instruction sheet given to you today.      Dear Kelli Webster  You are scheduled for a Cardioversion on Monday, October 13 with Dr. Rolan.  Please arrive at the [facility] (Main Entrance A) at [facility]: [address] [place], [place] [address] at 7:30 AM (This time is 1 hour(s) before your procedure to ensure your preparation).   Free valet parking service is available. You will check in at ADMITTING.   *Please Note: You will receive a call the day before your procedure to confirm the appointment time. That time may have changed from the original time based on the schedule for that day.*    DIET:  Nothing to eat or drink after midnight except a sip of water with medications (see medication instructions below)  MEDICATION INSTRUCTIONS: !!IF ANY NEW MEDICATIONS ARE STARTED AFTER TODAY, PLEASE NOTIFY YOUR PROVIDER AS SOON AS POSSIBLE!!  FYI: Medications such as Semaglutide (Ozempic, [place]), Tirzepatide (Mounjaro, Zepbound), Dulaglutide (Trulicity), etc (GLP1 agonists) AND Canagliflozin (Invokana), Dapagliflozin  (Farxiga ), Empagliflozin (Jardiance), Ertugliflozin (Steglatro), Bexagliflozin [employer]) or any combination with one of these drugs such as Invokamet (Canagliflozin/Metformin), Synjardy (Empagliflozin/Metformin), etc (SGLT2 inhibitors) must be held around the time of a procedure. This is not a comprehensive list of all of these drugs. Please review all of your medications and talk to your provider if you take  any one of these. If you are not sure, ask your provider.   HOLD: Dapagliflozin  (Farxiga ) for 3 days prior to the procedure. Last dose on Thursday, October 09.  Continue taking your anticoagulant (blood thinner): Apixaban  (Eliquis ).  You will need to continue this after your procedure until you are told by your provider that it is safe to stop.    LABS:  Labs done 09/26/2024  FYI:  For your safety, and to allow us  to monitor your vital signs accurately during the surgery/procedure we request: If you have artificial nails, gel coating, SNS etc, please have those removed prior to your surgery/procedure. Not having the nail coverings /polish removed may result in cancellation or delay of your surgery/procedure.  Your support person will be asked to wait in the waiting room during your procedure.  It is OK to have someone drop you off and come back when you are ready to be discharged.  You cannot drive after the procedure and will need someone to drive you home.  Bring your insurance cards.  *Special Note: Every effort is made to have your procedure done on time. Occasionally there are emergencies that occur at the hospital that may cause delays. Please be patient if a delay does occur.   \

## 2024-09-28 NOTE — Patient Instructions (Signed)
 SURGICAL WAITING ROOM VISITATION Patients having surgery or a procedure may have no more than 2 support people in the waiting area - these visitors may rotate in the visitor waiting room.   If the patient needs to stay at the hospital during part of their recovery, the visitor guidelines for inpatient rooms apply.  PRE-OP VISITATION  Pre-op nurse will coordinate an appropriate time for 1 support person to accompany the patient in pre-op.  This support person may not rotate.  This visitor will be contacted when the time is appropriate for the visitor to come back in the pre-op area.  Please refer to the [facility] website for the visitor guidelines for Inpatients (after your surgery is over and you are in a regular room).  You are not required to quarantine at this time prior to your surgery. However, you must do this: Hand Hygiene often Do NOT share personal items Notify your provider if you are in close contact with someone who has COVID or you develop fever 100.4 or greater, new onset of sneezing, cough, sore throat, shortness of breath or body aches.  If you test positive for Covid or have been in contact with anyone that has tested positive in the last 10 days please notify you surgeon.    Your procedure is scheduled on:  MONDAY October 07, 2024   Report to [facility] Main Entrance: [place] entrance where the [employer] is available.   Report to admitting at:  09:00   AM  Call this number if you have any questions or problems the morning of surgery [phone]  FOLLOW ANY ADDITIONAL PRE OP INSTRUCTIONS YOU RECEIVED FROM YOUR SURGEON'S OFFICE!!!  Do not eat food after Midnight the night prior to your surgery/procedure.   After Midnight you may have Water, or Sports drinks like Gatorade or Powerade (No RED color) until   08:00 am the morning of your surgery.  After 08:00 am, do not drink or eat anything; No candy, chewing gum or throat lozenges.    Oral Hygiene is also  important to reduce your risk of infection.        Remember - BRUSH YOUR TEETH THE MORNING OF SURGERY WITH YOUR REGULAR TOOTHPASTE  Do NOT smoke after Midnight the night before surgery.  STOP TAKING all Vitamins, Herbs and supplements 1 week before your surgery.   FARXIGA - stop taking 72 hours before your surgery. Last dose will be taken on  Thursday 10-03-2024.   ELIQUIS - stop taking 48 hours before your surgery. Last dose will be taken on Friday 10-04-24.  Take ONLY these medicines the morning of surgery with A SIP OF WATER: Carvedilol , amiodarone , Famotidine  and Tylenol  if needed.  DO NOT TAKE LOSARTAN  or SPIRONOLACTONE  the morning of your surgery.                   You may not have any metal on your body including hair pins, jewelry, and body piercing  Do not wear make-up, lotions, powders, perfumes  or deodorant  Do not wear nail polish including gel and S&S, artificial / acrylic nails, or any other type of covering on natural nails including finger and toenails. If you have artificial nails, gel coating, etc., that needs to be removed by a nail salon, Please have this removed prior to surgery. Not doing so may mean that your surgery could be cancelled or delayed if the Surgeon or anesthesia staff feels like they are unable to monitor you safely.  Do not shave 48 hours prior to surgery to avoid nicks in your skin which may contribute to postoperative infections.    Contacts, Hearing Aids, dentures or bridgework may not be worn into surgery. DENTURES WILL BE REMOVED PRIOR TO SURGERY PLEASE DO NOT APPLY Poly grip OR ADHESIVES!!!  Patients discharged on the day of surgery will not be allowed to drive home.  Someone NEEDS to stay with you for the first 24 hours after anesthesia.  Do not bring your home medications to the hospital. The Pharmacy will dispense medications listed on your medication list to you during your admission in the Hospital.  Please read over the following fact  sheets you were given: IF YOU HAVE QUESTIONS ABOUT YOUR PRE-OP INSTRUCTIONS, PLEASE CALL [phone]    [facility] - Preparing for Surgery        Before surgery, you can play an important role.  Because skin is not sterile, your skin needs to be as free of germs as possible.  You can reduce the number of germs on your skin by washing with CHG (chlorahexidine gluconate) soap before surgery.  CHG is an antiseptic cleaner which kills germs and bonds with the skin to continue killing germs even after washing. Please DO NOT use if you have an allergy to CHG or antibacterial soaps.  If your skin becomes reddened/irritated stop using the CHG and inform your nurse when you arrive at Short Stay. Do not shave (including legs and underarms) for at least 48 hours prior to the first CHG shower.  You may shave your face/neck.  Please follow these instructions carefully:  1.  Shower with CHG Soap the night before surgery ONLY (DO NOT USE THE CHG SOAP THE MORNING OF SURGERY).  2.  If you choose to wash your hair, wash your hair first as usual with your normal  shampoo.  3.  After you shampoo, rinse your hair and body thoroughly to remove the shampoo.                             4.  Use CHG as you would any other liquid soap.  You can apply chg directly to the skin and wash.  Gently with a scrungie or clean washcloth.  5.  Apply the CHG Soap to your body ONLY FROM THE NECK DOWN.   Do not use on face/ open                           Wound or open sores. Avoid contact with eyes, ears mouth and genitals (private parts).                       Wash face,  Genitals (private parts) with your normal soap.             6.  Wash thoroughly, paying special attention to the area where your  surgery  will be performed.  7.  Thoroughly rinse your body with warm water from the neck down.  8.  DO NOT shower/wash with your normal soap after using and rinsing off the CHG Soap.                9.  Pat yourself dry with a clean  towel.            10.  Wear clean pajamas.  11.  Place clean sheets on your bed the night of your first shower and do not  sleep with pets.  Day of Surgery : Do not apply any CHG, lotions/deodorants the morning of surgery.  Please wear clean clothes to the hospital/surgery center.   FAILURE TO FOLLOW THESE INSTRUCTIONS MAY RESULT IN THE CANCELLATION OF YOUR SURGERY  PATIENT SIGNATURE_________________________________  NURSE SIGNATURE__________________________________  ________________________________________________________________________

## 2024-09-28 NOTE — Progress Notes (Signed)
 COVID Vaccine received:  []  No [x]  Yes Date of any COVID positive Test in last 90 days:  PCP - Dr. Prentice Blush at [facility] at [facility] - Ezra Shuck, MD Endocrinology- Tommas Pears, MD   Chest x-ray - 09-18-2023  1v  Epic EKG -  09-26-2024  Epic Stress Test -  ECHO - 09-19-2024  Epic Cardiac Cath -  CT Coronary Calcium score:   Pacemaker / ICD device [x]  No []  Yes   Spinal Cord Stimulator:[x]  No []  Yes       History of Sleep Apnea? []  No [x]  Yes   CPAP used?- [x]  No []  Yes  claustrophobic  Patient has: [x]  NO Hx DM   []  Pre-DM   []  DM1  []   DM2 Does the patient monitor blood sugar?   [x]  N/A   []  No []  Yes   FARXIGA  for Cardiac conditions; Hold x 72 hrs, Last dose on Thursday   10-03-24   Blood Thinner / Instructions: ELIQUIS - hold x 48 hrs. Last dose Friday 10-04-24 Aspirin  Instructions:  none  Dental hx: []  Dentures:  []  N/A      []  Bridge or Partial:                   []  Loose or Damaged teeth:   Comments:   Activity level: Able to walk up 2 flights of stairs without becoming significantly short of breath or having chest pain?  []  No   []    Yes  Patient can perform ADLs without assistance. []  No   []   Yes  Anesthesia review: A.fib? mult. DCCV & ablations, CHF, CKD3b, GERD, OSA- No CPAP d/t claustrophobia,   Patient denies any S&S of respiratory illness or Covid - no shortness of breath, fever, cough or chest pain at PAT appointment.  Patient verbalized understanding and agreement to the Pre-Surgical Instructions that were given to them at this PAT appointment. Patient was also educated of the need to review these PAT instructions again prior to her surgery.I reviewed the appropriate phone numbers to call if they have any and questions or concerns.

## 2024-09-30 ENCOUNTER — Encounter (HOSPITAL_COMMUNITY): Payer: Self-pay | Admitting: Physician Assistant

## 2024-09-30 ENCOUNTER — Encounter (HOSPITAL_COMMUNITY)
Admission: RE | Admit: 2024-09-30 | Discharge: 2024-09-30 | Disposition: A | Discharge status: Home / Self Care | Source: Ambulatory Visit | Attending: Surgery | Admitting: Surgery

## 2024-09-30 ENCOUNTER — Other Ambulatory Visit: Payer: Self-pay

## 2024-09-30 ENCOUNTER — Encounter (HOSPITAL_COMMUNITY): Payer: Self-pay

## 2024-09-30 VITALS — BP 122/58 | HR 68 | Temp 97.8°F | Resp 22 | Ht 63.0 in | Wt 200.0 lb

## 2024-09-30 DIAGNOSIS — N1832 Chronic kidney disease, stage 3b: Secondary | ICD-10-CM | POA: Insufficient documentation

## 2024-09-30 DIAGNOSIS — Z01818 Encounter for other preprocedural examination: Secondary | ICD-10-CM

## 2024-09-30 DIAGNOSIS — Z7984 Long term (current) use of oral hypoglycemic drugs: Secondary | ICD-10-CM | POA: Insufficient documentation

## 2024-09-30 DIAGNOSIS — G4730 Sleep apnea, unspecified: Secondary | ICD-10-CM | POA: Insufficient documentation

## 2024-09-30 DIAGNOSIS — Z7901 Long term (current) use of anticoagulants: Secondary | ICD-10-CM | POA: Insufficient documentation

## 2024-09-30 DIAGNOSIS — D123 Benign neoplasm of transverse colon: Secondary | ICD-10-CM | POA: Diagnosis not present

## 2024-09-30 DIAGNOSIS — I2510 Atherosclerotic heart disease of native coronary artery without angina pectoris: Secondary | ICD-10-CM | POA: Insufficient documentation

## 2024-09-30 DIAGNOSIS — Z79899 Other long term (current) drug therapy: Secondary | ICD-10-CM | POA: Insufficient documentation

## 2024-09-30 DIAGNOSIS — E210 Primary hyperparathyroidism: Secondary | ICD-10-CM | POA: Insufficient documentation

## 2024-09-30 DIAGNOSIS — D649 Anemia, unspecified: Secondary | ICD-10-CM | POA: Diagnosis not present

## 2024-09-30 DIAGNOSIS — I130 Hypertensive heart and chronic kidney disease with heart failure and stage 1 through stage 4 chronic kidney disease, or unspecified chronic kidney disease: Secondary | ICD-10-CM | POA: Insufficient documentation

## 2024-09-30 DIAGNOSIS — Z01812 Encounter for preprocedural laboratory examination: Secondary | ICD-10-CM | POA: Insufficient documentation

## 2024-09-30 DIAGNOSIS — K219 Gastro-esophageal reflux disease without esophagitis: Secondary | ICD-10-CM | POA: Insufficient documentation

## 2024-09-30 DIAGNOSIS — I4891 Unspecified atrial fibrillation: Secondary | ICD-10-CM | POA: Insufficient documentation

## 2024-09-30 DIAGNOSIS — I5022 Chronic systolic (congestive) heart failure: Secondary | ICD-10-CM | POA: Insufficient documentation

## 2024-09-30 HISTORY — DX: Chronic kidney disease, unspecified: N18.9

## 2024-09-30 HISTORY — DX: Sleep apnea, unspecified: G47.30

## 2024-09-30 HISTORY — DX: Essential (primary) hypertension: I10

## 2024-09-30 HISTORY — DX: Cardiac arrhythmia, unspecified: I49.9

## 2024-09-30 LAB — CBC
HCT: 26.8 % — ABNORMAL LOW (ref 36.0–46.0)
Hemoglobin: 7.1 g/dL — ABNORMAL LOW (ref 12.0–15.0)
MCH: 19.6 pg — ABNORMAL LOW (ref 26.0–34.0)
MCHC: 26.5 g/dL — ABNORMAL LOW (ref 30.0–36.0)
MCV: 74 fL — ABNORMAL LOW (ref 80.0–100.0)
Platelets: 538 K/uL — ABNORMAL HIGH (ref 150–400)
RBC: 3.62 MIL/uL — ABNORMAL LOW (ref 3.87–5.11)
RDW: 17.2 % — ABNORMAL HIGH (ref 11.5–15.5)
WBC: 6.8 K/uL (ref 4.0–10.5)
nRBC: 0 % (ref 0.0–0.2)

## 2024-09-30 LAB — COMPREHENSIVE METABOLIC PANEL WITH GFR
ALT: 14 U/L (ref 0–44)
AST: 21 U/L (ref 15–41)
Albumin: 3.8 g/dL (ref 3.5–5.0)
Alkaline Phosphatase: 75 U/L (ref 38–126)
Anion gap: 10 (ref 5–15)
BUN: 22 mg/dL (ref 8–23)
CO2: 24 mmol/L (ref 22–32)
Calcium: 11.7 mg/dL — ABNORMAL HIGH (ref 8.9–10.3)
Chloride: 107 mmol/L (ref 98–111)
Creatinine, Ser: 1.56 mg/dL — ABNORMAL HIGH (ref 0.44–1.00)
GFR, Estimated: 34 mL/min — ABNORMAL LOW (ref >60)
Glucose, Bld: 119 mg/dL — ABNORMAL HIGH (ref 70–99)
Potassium: 5 mmol/L (ref 3.5–5.1)
Sodium: 140 mmol/L (ref 135–145)
Total Bilirubin: 0.5 mg/dL (ref 0.0–1.2)
Total Protein: 5.9 g/dL — ABNORMAL LOW (ref 6.5–8.1)

## 2024-09-30 NOTE — Progress Notes (Signed)
 CRITICAL RESULT PROVIDER NOTIFICATION   PST lab value sent to Dr. Eletha and Burnard Crete for review;   HGB 7.1

## 2024-10-01 ENCOUNTER — Inpatient Hospital Stay (HOSPITAL_COMMUNITY)
Admission: EM | Admit: 2024-10-01 | Discharge: 2024-10-10 | DRG: 329 | Disposition: A | Discharge status: Home / Self Care | Source: Ambulatory Visit | Attending: Internal Medicine | Admitting: Internal Medicine

## 2024-10-01 ENCOUNTER — Other Ambulatory Visit: Payer: Self-pay

## 2024-10-01 ENCOUNTER — Encounter (HOSPITAL_COMMUNITY): Payer: Self-pay

## 2024-10-01 ENCOUNTER — Emergency Department (HOSPITAL_COMMUNITY)

## 2024-10-01 ENCOUNTER — Ambulatory Visit: Payer: Self-pay | Admitting: Surgery

## 2024-10-01 DIAGNOSIS — Z8249 Family history of ischemic heart disease and other diseases of the circulatory system: Secondary | ICD-10-CM | POA: Diagnosis not present

## 2024-10-01 DIAGNOSIS — Z7901 Long term (current) use of anticoagulants: Secondary | ICD-10-CM

## 2024-10-01 DIAGNOSIS — R160 Hepatomegaly, not elsewhere classified: Secondary | ICD-10-CM | POA: Diagnosis present

## 2024-10-01 DIAGNOSIS — I509 Heart failure, unspecified: Secondary | ICD-10-CM | POA: Diagnosis not present

## 2024-10-01 DIAGNOSIS — R918 Other nonspecific abnormal finding of lung field: Secondary | ICD-10-CM | POA: Diagnosis present

## 2024-10-01 DIAGNOSIS — E66811 Obesity, class 1: Secondary | ICD-10-CM | POA: Diagnosis present

## 2024-10-01 DIAGNOSIS — D125 Benign neoplasm of sigmoid colon: Secondary | ICD-10-CM | POA: Diagnosis present

## 2024-10-01 DIAGNOSIS — I4819 Other persistent atrial fibrillation: Secondary | ICD-10-CM | POA: Diagnosis present

## 2024-10-01 DIAGNOSIS — K2961 Other gastritis with bleeding: Secondary | ICD-10-CM | POA: Diagnosis present

## 2024-10-01 DIAGNOSIS — K2960 Other gastritis without bleeding: Secondary | ICD-10-CM | POA: Diagnosis not present

## 2024-10-01 DIAGNOSIS — K429 Umbilical hernia without obstruction or gangrene: Secondary | ICD-10-CM | POA: Diagnosis present

## 2024-10-01 DIAGNOSIS — D649 Anemia, unspecified: Secondary | ICD-10-CM | POA: Diagnosis present

## 2024-10-01 DIAGNOSIS — N1832 Chronic kidney disease, stage 3b: Secondary | ICD-10-CM | POA: Diagnosis present

## 2024-10-01 DIAGNOSIS — G4733 Obstructive sleep apnea (adult) (pediatric): Secondary | ICD-10-CM | POA: Diagnosis present

## 2024-10-01 DIAGNOSIS — Z79899 Other long term (current) drug therapy: Secondary | ICD-10-CM

## 2024-10-01 DIAGNOSIS — E210 Primary hyperparathyroidism: Secondary | ICD-10-CM | POA: Diagnosis present

## 2024-10-01 DIAGNOSIS — D122 Benign neoplasm of ascending colon: Secondary | ICD-10-CM | POA: Diagnosis present

## 2024-10-01 DIAGNOSIS — Z01810 Encounter for preprocedural cardiovascular examination: Secondary | ICD-10-CM | POA: Diagnosis not present

## 2024-10-01 DIAGNOSIS — I4891 Unspecified atrial fibrillation: Secondary | ICD-10-CM | POA: Diagnosis not present

## 2024-10-01 DIAGNOSIS — K648 Other hemorrhoids: Secondary | ICD-10-CM | POA: Diagnosis present

## 2024-10-01 DIAGNOSIS — I428 Other cardiomyopathies: Secondary | ICD-10-CM | POA: Diagnosis present

## 2024-10-01 DIAGNOSIS — K922 Gastrointestinal hemorrhage, unspecified: Secondary | ICD-10-CM | POA: Diagnosis not present

## 2024-10-01 DIAGNOSIS — I130 Hypertensive heart and chronic kidney disease with heart failure and stage 1 through stage 4 chronic kidney disease, or unspecified chronic kidney disease: Secondary | ICD-10-CM | POA: Diagnosis present

## 2024-10-01 DIAGNOSIS — N189 Chronic kidney disease, unspecified: Secondary | ICD-10-CM | POA: Diagnosis not present

## 2024-10-01 DIAGNOSIS — I5042 Chronic combined systolic (congestive) and diastolic (congestive) heart failure: Secondary | ICD-10-CM | POA: Diagnosis present

## 2024-10-01 DIAGNOSIS — D128 Benign neoplasm of rectum: Secondary | ICD-10-CM | POA: Diagnosis present

## 2024-10-01 DIAGNOSIS — D62 Acute posthemorrhagic anemia: Secondary | ICD-10-CM | POA: Diagnosis present

## 2024-10-01 DIAGNOSIS — K5730 Diverticulosis of large intestine without perforation or abscess without bleeding: Secondary | ICD-10-CM | POA: Diagnosis present

## 2024-10-01 DIAGNOSIS — I482 Chronic atrial fibrillation, unspecified: Secondary | ICD-10-CM | POA: Diagnosis not present

## 2024-10-01 DIAGNOSIS — D123 Benign neoplasm of transverse colon: Principal | ICD-10-CM | POA: Diagnosis present

## 2024-10-01 DIAGNOSIS — Z6841 Body Mass Index (BMI) 40.0 and over, adult: Secondary | ICD-10-CM | POA: Diagnosis not present

## 2024-10-01 DIAGNOSIS — K219 Gastro-esophageal reflux disease without esophagitis: Secondary | ICD-10-CM | POA: Diagnosis present

## 2024-10-01 DIAGNOSIS — I480 Paroxysmal atrial fibrillation: Secondary | ICD-10-CM | POA: Diagnosis not present

## 2024-10-01 DIAGNOSIS — I5022 Chronic systolic (congestive) heart failure: Secondary | ICD-10-CM | POA: Diagnosis not present

## 2024-10-01 DIAGNOSIS — M810 Age-related osteoporosis without current pathological fracture: Secondary | ICD-10-CM | POA: Insufficient documentation

## 2024-10-01 DIAGNOSIS — K6289 Other specified diseases of anus and rectum: Secondary | ICD-10-CM | POA: Diagnosis not present

## 2024-10-01 DIAGNOSIS — I5020 Unspecified systolic (congestive) heart failure: Secondary | ICD-10-CM | POA: Diagnosis not present

## 2024-10-01 LAB — BASIC METABOLIC PANEL WITH GFR
Anion gap: 11 (ref 5–15)
BUN: 26 mg/dL — ABNORMAL HIGH (ref 8–23)
CO2: 23 mmol/L (ref 22–32)
Calcium: 11.8 mg/dL — ABNORMAL HIGH (ref 8.9–10.3)
Chloride: 105 mmol/L (ref 98–111)
Creatinine, Ser: 1.71 mg/dL — ABNORMAL HIGH (ref 0.44–1.00)
GFR, Estimated: 30 mL/min — ABNORMAL LOW (ref >60)
Glucose, Bld: 140 mg/dL — ABNORMAL HIGH (ref 70–99)
Potassium: 4.5 mmol/L (ref 3.5–5.1)
Sodium: 139 mmol/L (ref 135–145)

## 2024-10-01 LAB — FERRITIN: Ferritin: 9 ng/mL — ABNORMAL LOW (ref 11–307)

## 2024-10-01 LAB — IRON AND TIBC
Iron: 20 ug/dL — ABNORMAL LOW (ref 28–170)
Saturation Ratios: 4 % — ABNORMAL LOW (ref 10.4–31.8)
TIBC: 517 ug/dL — ABNORMAL HIGH (ref 250–450)
UIBC: 497 ug/dL

## 2024-10-01 LAB — VITAMIN B12: Vitamin B-12: 508 pg/mL (ref 180–914)

## 2024-10-01 LAB — CBC
HCT: 26.9 % — ABNORMAL LOW (ref 36.0–46.0)
Hemoglobin: 7.1 g/dL — ABNORMAL LOW (ref 12.0–15.0)
MCH: 19.3 pg — ABNORMAL LOW (ref 26.0–34.0)
MCHC: 26.4 g/dL — ABNORMAL LOW (ref 30.0–36.0)
MCV: 73.1 fL — ABNORMAL LOW (ref 80.0–100.0)
Platelets: 532 K/uL — ABNORMAL HIGH (ref 150–400)
RBC: 3.68 MIL/uL — ABNORMAL LOW (ref 3.87–5.11)
RDW: 17.2 % — ABNORMAL HIGH (ref 11.5–15.5)
WBC: 6.2 K/uL (ref 4.0–10.5)
nRBC: 0 % (ref 0.0–0.2)

## 2024-10-01 LAB — POC OCCULT BLOOD, ED: Fecal Occult Bld: POSITIVE — AB

## 2024-10-01 LAB — PREPARE RBC (CROSSMATCH)

## 2024-10-01 LAB — ABO/RH: ABO/RH(D): A POS

## 2024-10-01 MED ORDER — IRON SUCROSE 300 MG IVPB - SIMPLE MED
300.0000 mg | Freq: Once | Status: AC
  Administered 2024-10-01: 300 mg via INTRAVENOUS
  Filled 2024-10-01: qty 300

## 2024-10-01 MED ORDER — SPIRONOLACTONE 25 MG PO TABS
25.0000 mg | ORAL_TABLET | Freq: Every day | ORAL | Status: DC
  Administered 2024-10-02: 25 mg via ORAL
  Filled 2024-10-01: qty 1

## 2024-10-01 MED ORDER — SODIUM CHLORIDE 0.9% IV SOLUTION: Freq: Once | INTRAVENOUS | Status: AC

## 2024-10-01 MED ORDER — LOSARTAN POTASSIUM 50 MG PO TABS
25.0000 mg | ORAL_TABLET | Freq: Every day | ORAL | Status: DC
  Administered 2024-10-02: 25 mg via ORAL
  Filled 2024-10-01: qty 1

## 2024-10-01 MED ORDER — CARVEDILOL 3.125 MG PO TABS: 3.1250 mg | ORAL_TABLET | Freq: Two times a day (BID) | ORAL | Status: DC

## 2024-10-01 MED ORDER — DAPAGLIFLOZIN PROPANEDIOL 10 MG PO TABS
10.0000 mg | ORAL_TABLET | Freq: Every day | ORAL | Status: DC
  Filled 2024-10-01 (×2): qty 1

## 2024-10-01 MED ORDER — PANTOPRAZOLE SODIUM 40 MG IV SOLR
40.0000 mg | Freq: Two times a day (BID) | INTRAVENOUS | Status: DC
  Administered 2024-10-01 – 2024-10-10 (×17): 40 mg via INTRAVENOUS
  Filled 2024-10-01 (×18): qty 10

## 2024-10-01 MED ORDER — AMIODARONE HCL 200 MG PO TABS
200.0000 mg | ORAL_TABLET | Freq: Every morning | ORAL | Status: DC
  Administered 2024-10-02 – 2024-10-10 (×8): 200 mg via ORAL
  Filled 2024-10-01 (×9): qty 1

## 2024-10-01 MED ORDER — ORAL CARE MOUTH RINSE
15.0000 mL | OROMUCOSAL | Status: DC | PRN
  Administered 2024-10-02: 15 mL via OROMUCOSAL

## 2024-10-01 NOTE — ED Triage Notes (Signed)
 Pt reports that she has low hgb per her provider, pt is having shob.

## 2024-10-01 NOTE — Progress Notes (Signed)
 Anesthesia Chart Review   Case: [id number] Date/Time: 10/07/24 1100   Procedure: PARATHYROIDECTOMY (Right) - RIGHT INFERIOR MINIMALLY INVASIVE PARATHYROIDECTOMY   Anesthesia type: General   Diagnosis: Primary hyperparathyroidism [E21.0]   Pre-op diagnosis: PRIMARY HYPERPARATHYROIDISM   Location: [facility] ROOM 01 / [facility]   Surgeons: Eletha Boas, MD       DISCUSSION:77 y.o. never smoker with h/o GERD, HTN, sleep apnea, atrial fibrillation, CHF, CKD Stage III, primary hyperparathyroidism scheduled for above procedure 10/07/2024 with Dr. Boas Eletha.   Pt seen by cardiology 09/19/24. Pt in afib at this visit, amiodarone  increased. Pt referred to EP for consideration of repeat ablation. She was advised to follow with Dr. Rolan in 1 week. Per OV note, Planned for parathyroidectomy. Low risk surgery, I think she would be ok to do this. Will get echo before surgery. If she does not go back into NSR on her own, we will plan to do her cardioversion after her surgery (want to avoid stopping anticoagulation after DCCV).  Hemoglobin 7.1, discussed with triage nurse at [facility].  VS: BP (!) 122/58 Comment: right FOREARM per pt request  Pulse 68   Temp 36.6 C (Oral)   Resp (!) 22   Ht 5' 3 (1.6 m)   Wt 90.7 kg   SpO2 100%   BMI 35.43 kg/m   PROVIDERS: [place], [facility]   LABS: {CHL AN LABS REVIEWED:112001::Labs reviewed: Acceptable for surgery.} (all labs ordered are listed, but only abnormal results are displayed)  Labs Reviewed  COMPREHENSIVE METABOLIC PANEL WITH GFR - Abnormal; Notable for the following components:      Result Value   Glucose, Bld 119 (*)    Creatinine, Ser 1.56 (*)    Calcium 11.7 (*)    Total Protein 5.9 (*)    GFR, Estimated 34 (*)    All other components within normal limits  CBC - Abnormal; Notable for the following components:   RBC 3.62 (*)    Hemoglobin 7.1 (*)    HCT 26.8 (*)    MCV 74.0 (*)    MCH 19.6 (*)    MCHC 26.5 (*)    RDW  17.2 (*)    Platelets 538 (*)    All other components within normal limits     IMAGES:   EKG:   CV:  Past Medical History:  Diagnosis Date   Chronic kidney disease    CKD3a   Chronic systolic heart failure (HCC)    a. ECHO (09/2013) EF 20-25% diff HK, mild/mod MR, LA mod dil, RV mildly dil, RA mildly dil b. R/LHC (09/26/13) AO 133/88, LV 137/17, Lmain no dz, LAD 30% prox, LCX mild luminal irreg; RCA mild luminal irreg   Dysrhythmia    A.fib   had cardioversions and ablations   GERD (gastroesophageal reflux disease)    a. 09/2013 endoscopy nl   Hypertension    Morbid obesity (HCC)    Osteoarthritis    Persistent atrial fibrillation (HCC) Chads2vascscore of at least 4(02/12/15)   a. Dx 09/2013 b. s/p PVI by Dr Kelsie 11-26-2013   Sleep apnea    No CPAP d/t claustrophobia    Past Surgical History:  Procedure Laterality Date   ABLATION  11/26/2013   PVI by Dr Kelsie   arthroscopy       left knee     ATRIAL FIBRILLATION ABLATION N/A 11/26/2013   Procedure: ATRIAL FIBRILLATION ABLATION;  Surgeon: Lynwood JONETTA Kelsie, MD;  Location: [facility] CATH LAB;  Service: Cardiovascular;  Laterality: N/A;   ATRIAL FIBRILLATION ABLATION N/A 02/18/2021   Procedure: ATRIAL FIBRILLATION ABLATION;  Surgeon: Kelsie Agent, MD;  Location: [facility] INVASIVE CV LAB;  Service: Cardiovascular;  Laterality: N/A;   CARDIOVERSION N/A 10/01/2013   Procedure: CARDIOVERSION;  Surgeon: Ezra GORMAN Shuck, MD;  Location: [facility] ENDOSCOPY;  Service: Cardiovascular;  Laterality: N/A;   CARDIOVERSION N/A 10/17/2013   Procedure: CARDIOVERSION;  Surgeon: Ezra GORMAN Shuck, MD;  Location: [facility] ENDOSCOPY;  Service: Cardiovascular;  Laterality: N/A;   CARDIOVERSION N/A 02/20/2019   Procedure: CARDIOVERSION;  Surgeon: Shuck Ezra GORMAN, MD;  Location: [facility] ENDOSCOPY;  Service: Cardiovascular;  Laterality: N/A;   CARDIOVERSION N/A 11/19/2020   Procedure: CARDIOVERSION;  Surgeon: Shuck Ezra GORMAN, MD;  Location: [facility] ENDOSCOPY;  Service:  Cardiovascular;  Laterality: N/A;   CARDIOVERSION N/A 12/25/2020   Procedure: CARDIOVERSION;  Surgeon: Shuck Ezra GORMAN, MD;  Location: [facility] ENDOSCOPY;  Service: Cardiovascular;  Laterality: N/A;   CARDIOVERSION N/A 11/17/2021   Procedure: CARDIOVERSION;  Surgeon: Shuck Ezra GORMAN, MD;  Location: [facility] ENDOSCOPY;  Service: Cardiovascular;  Laterality: N/A;   ESOPHAGOGASTRODUODENOSCOPY N/A 09/25/2013   Procedure: ESOPHAGOGASTRODUODENOSCOPY (EGD);  Surgeon: Lesta JULIANNA Fitz, MD;  Location: [facility] ENDOSCOPY;  Service: Endoscopy;  Laterality: N/A;   fx left wrist     open reduction & internal fixaction   LEFT HEART CATHETERIZATION WITH CORONARY ANGIOGRAM  09/26/2013   Procedure: LEFT HEART CATHETERIZATION WITH CORONARY ANGIOGRAM;  Surgeon: Ezra GORMAN Shuck, MD;  Location: [facility] CATH LAB;  Service: Cardiovascular;;   TEE WITHOUT CARDIOVERSION N/A 10/01/2013   Procedure: TRANSESOPHAGEAL ECHOCARDIOGRAM (TEE);  Surgeon: Ezra GORMAN Shuck, MD;  Location: [facility] ENDOSCOPY;  Service: Cardiovascular;  Laterality: N/A;   TEE WITHOUT CARDIOVERSION N/A 11/26/2013   Procedure: TRANSESOPHAGEAL ECHOCARDIOGRAM (TEE);  Surgeon: Ezra GORMAN Shuck, MD;  Location: [facility] ENDOSCOPY;  Service: Cardiovascular;  Laterality: N/A;   TONSILLECTOMY     age 37    MEDICATIONS:  acetaminophen  (TYLENOL ) 325 MG tablet   amiodarone  (PACERONE ) 200 MG tablet   carvedilol  (COREG ) 3.125 MG tablet   ELIQUIS  5 MG TABS tablet   famotidine  (PEPCID ) 20 MG tablet   FARXIGA  10 MG TABS tablet   losartan  (COZAAR ) 25 MG tablet   MAGNESIUM PO   Multiple Vitamin (MULTIVITAMIN WITH MINERALS) TABS tablet   spironolactone  (ALDACTONE ) 25 MG tablet   No current facility-administered medications for this encounter.

## 2024-10-01 NOTE — Telephone Encounter (Signed)
 Attempted to call pt, no answer LVM to return call  pt needs to go to the ER due to levels and no provider available in office

## 2024-10-01 NOTE — ED Provider Notes (Signed)
 [place] EMERGENCY DEPARTMENT AT [facility] Provider Note   CSN: [id number] Arrival date & time: 10/01/24  1259     Patient presents with: abnormal labs    Kelli Webster is a 78 y.o. female with past medical history of A-fib, systolic HF, OSA, CKD, HTN presents to Emergency Department from PCP recommendation for anemia.  Was obtaining lab work for surgical clearance for parathyroidectomy.  Also endorses increased fatigue, shortness of breath, lightheadedness over the past 2 weeks but does have chronic fatigue at baseline.  Has never had a blood transfusion in the past but has had an iron transfusion in 2023 for IDA.  Denies melena, rectal bleeding, hemoptysis    HPI     Prior to Admission medications   Medication Sig Start Date End Date Taking? Authorizing Provider  acetaminophen  (TYLENOL ) 325 MG tablet Take 325-650 mg by mouth every 6 (six) hours as needed (pain.).    [provider]  amiodarone  (PACERONE ) 200 MG tablet Take 200 mg Twice daily for 1 week then 200 mg Daily Patient taking differently: Take 200 mg by mouth in the morning. 09/19/24   Rolan Ezra RAMAN, MD  carvedilol  (COREG ) 3.125 MG tablet TAKE 1 TABLET BY MOUTH 2 TIMES DAILY. 09/16/24   Rolan Ezra RAMAN, MD  ELIQUIS  5 MG TABS tablet TAKE 1 TABLET BY MOUTH TWICE  DAILY 04/02/24   McLean, Dalton S, MD  famotidine  (PEPCID ) 20 MG tablet Take 20 mg by mouth daily as needed for heartburn or indigestion.    [provider]  FARXIGA  10 MG TABS tablet Take 1 tablet (10 mg total) by mouth daily before breakfast. 09/27/23   Rolan Ezra RAMAN, MD  losartan  (COZAAR ) 25 MG tablet Take 1 tablet (25 mg total) by mouth daily. 04/09/24   Glena Harlene HERO, FNP  MAGNESIUM PO Take 1 capsule by mouth every evening.    [provider]  Multiple Vitamin (MULTIVITAMIN WITH MINERALS) TABS tablet Take 1 tablet by mouth in the morning. Senior Multivitamin    [provider]  spironolactone  (ALDACTONE ) 25  MG tablet Take 1 tablet (25 mg total) by mouth daily. Patient taking differently: Take 25 mg by mouth at bedtime. 09/19/24 12/18/24  Rolan Ezra RAMAN, MD    Allergies: Patient has no known allergies.    Review of Systems  Neurological:  Positive for light-headedness.    Updated Vital Signs BP 96/78   Pulse (!) 56   Temp (!) 97.3 F (36.3 C) (Oral)   Resp (!) 23   SpO2 99%   Physical Exam Vitals and nursing note reviewed.  Constitutional:      General: She is not in acute distress.    Appearance: Normal appearance.  HENT:     Head: Normocephalic and atraumatic.  Eyes:     Conjunctiva/sclera: Conjunctivae normal.  Cardiovascular:     Rate and Rhythm: Normal rate.  Pulmonary:     Effort: Pulmonary effort is normal. No respiratory distress.     Breath sounds: Normal breath sounds.  Musculoskeletal:     Right lower leg: No edema.     Left lower leg: No edema.  Skin:    Coloration: Skin is not jaundiced or pale.  Neurological:     Mental Status: She is alert and oriented to person, place, and time. Mental status is at baseline.     (all labs ordered are listed, but only abnormal results are displayed) Labs Reviewed  BASIC METABOLIC PANEL WITH GFR - Abnormal; Notable  for the following components:      Result Value   Glucose, Bld 140 (*)    BUN 26 (*)    Creatinine, Ser 1.71 (*)    Calcium 11.8 (*)    GFR, Estimated 30 (*)    All other components within normal limits  CBC - Abnormal; Notable for the following components:   RBC 3.68 (*)    Hemoglobin 7.1 (*)    HCT 26.9 (*)    MCV 73.1 (*)    MCH 19.3 (*)    MCHC 26.4 (*)    RDW 17.2 (*)    Platelets 532 (*)    All other components within normal limits  POC OCCULT BLOOD, ED - Abnormal; Notable for the following components:   Fecal Occult Bld POSITIVE (*)    All other components within normal limits  TYPE AND SCREEN  ABO/RH  PREPARE RBC (CROSSMATCH)    EKG: None  Radiology: DG Chest 2 View Result Date:  10/01/2024 EXAM: 2 VIEW(S) XRAY OF THE CHEST 10/01/2024 03:02:00 PM COMPARISON: 09/18/2023 CLINICAL HISTORY: shob. Pt reports that she has low hgb per her provider, pt is having shob. FINDINGS: LUNGS AND PLEURA: No focal pulmonary opacity. No pulmonary edema. No pleural effusion. No pneumothorax. HEART AND MEDIASTINUM: Stable cardiomegaly. BONES AND SOFT TISSUES: No acute osseous abnormality. IMPRESSION: 1. Stable cardiomegaly. Electronically signed by: Lynwood Seip MD 10/01/2024 03:14 PM EDT RP Workstation: [id number]     .Critical Care  Performed by: Minnie Tinnie BRAVO, PA Authorized by: Minnie Tinnie BRAVO, PA   Critical care provider statement:    Critical care time (minutes):  32   Critical care was necessary to treat or prevent imminent or life-threatening deterioration of the following conditions:  Circulatory failure   Critical care was time spent personally by me on the following activities:  Blood draw for specimens, discussions with consultants, evaluation of patient's response to treatment, examination of patient, obtaining history from patient or surrogate, ordering and performing treatments and interventions, ordering and review of laboratory studies, ordering and review of radiographic studies, pulse oximetry, re-evaluation of patient's condition and review of old charts   Care discussed with: admitting provider      Medications Ordered in the ED  0.9 %  sodium chloride  infusion (Manually program via Guardrails IV Fluids) ( Intravenous New Bag/Given 10/01/24 1559)                                    Medical Decision Making Amount and/or Complexity of Data Reviewed Labs: ordered. Radiology: ordered.  Risk Prescription drug management. Decision regarding hospitalization.   Patient presents to the ED for concern of anemia, this involves an extensive number of treatment options, and is a complaint that carries with it a high risk of complications and morbidity.  The differential  diagnosis includes symptomatic anemia, anemia, electrolyte abnormality, pneumonia, fluid overload,  ACS   Co morbidities that complicate the patient evaluation  See HPI   Additional history obtained:  Additional history obtained from Nursing and Outside Medical Records   External records from outside source obtained and reviewed including triage RN note, PCP note from yesterday, previous labwork   Lab Tests:  I Ordered, and personally interpreted labs.  The pertinent results include:   Hgb 7.1 PLT 532   Imaging Studies ordered:  I ordered imaging studies including CXR  I independently visualized and interpreted imaging which showed stable cardiomegaly  I agree with the radiologist interpretation   Cardiac Monitoring:  The patient was maintained on a cardiac monitor.  I personally viewed and interpreted the cardiac monitored which showed an underlying rhythm of: Atrial fibrillation similar to previous   Medicines ordered and prescription drug management:  I ordered medication including pRBC  for anemia  Reevaluation of the patient after these medicines showed that the patient improved I have reviewed the patients home medicines and have made adjustments as needed    Critical Interventions:  Anemia requiring blood transfusion x 2   Consultations Obtained:  I requested consultation with GI Dr. Elicia,  and discussed lab and imaging findings as well as pertinent plan - they recommend:  Med admission  Will consult on pt during admission  Requested consultation with hospitalist, and discussed lab and imaging findings and plan Dr.Gonfa accepts patient for admission   Problem List / ED Course:  Anemia Last CBC was from 09/19/2023.  Hgb at that time 1 year ago was 9.7.  She appears to trend 9.7-10.9 over the past yr. She has had iron transfusion in the past but no blood transfusion. Denies vaginal bleeding, hemoptysis, rectal bleeding, melena.   Fecal occult  positive No hx of colonoscopy nor endoscopy Hemodynamically stable with no hypotension or tachycardia.  At this time and is currently stable and does not require emergent blood release As she melena for parathyroidism, this may be contributing to anemia.  However, with positive fecal occult, I did consult GI for consult during admission Provided 2 units of PRBC transfusion while in ED While in ED department, patient's blood pressure went from 139 to 96.  No tachycardia. Think patient would benefit from admission for GI consult, continued blood transfusion, serial hgb checks  SHOB Has been occurring over the past 2 weeks and likely secondary to symptomatic anemia No signs of fluid overload.  No pedal edema bilaterally.  No rales auscultated Chest x-ray notable for stable cardiomegaly Maintaining saturation without supplementation.  Vital signs respiratory distress No complaints of chest pain.  Low suspicion for ACS with EKG per patient's baseline No unilateral pedal edema, recent travel, recent surgeries, hx of DVT/PE. Currently on Eliquis . Low suspicion for PE as etiology   Reevaluation:  After the interventions noted above, I reevaluated the patient and found that they have :improved    Dispostion:  After consideration of the diagnostic results and the patients response to treatment, I feel that the patent would benefit from admission, GI consult.   Discussed ED workup, disposition with patient expressed understand agrees with plan.  All questions answered to their satisfaction.  Final diagnoses:  Symptomatic anemia  Gastrointestinal hemorrhage, unspecified gastrointestinal hemorrhage type    ED Discharge Orders     None        Minnie Tinnie BRAVO, PA 10/01/24 2100    Armenta Canning, MD 10/02/24 1143

## 2024-10-01 NOTE — Telephone Encounter (Signed)
 PATIENT HAS ADDITIONAL  QUESTION PLEASE CALL TO DISCUSS  CALL BACK [phone]

## 2024-10-01 NOTE — Telephone Encounter (Signed)
 Copied from CRM #[id number]. Topic: Clinical Concerns - Lab Results >> Oct 01, 2024  9:34 AM Rosina BROCKS wrote: [facility] is calling for clinical concerns (Ask: What symptoms are you calling about today, AND how long have you had these symptoms? Must Review HPKW list for symptoms) Document Name of Triage Nurse/BH Rep taking the call when applicable)   Include all details related to the request(s) below:  [facility], calling on behalf of patient. Patient is scheduled for medical procedure but had labs drawn and office wanted to let provider know about abnormal hemmoglobin levels of 7.1, RBC 3.62.  States hemmoglobin needs to be corrected before they can proceed with surgery. Surgery is scheduled for 10/20.    Confirm and type the Best Contact Number below:  Patient/caller contact number:   [phone]          [] Home  [] Mobile  [x] Work [] Other   [x] Okay to leave a voicemail   Medication List:  Current Outpatient Medications:  .  acetaminophen  (TYLENOL ) 325 mg tablet, Take 325 mg by mouth., Disp: , Rfl:  .  amiodarone  (PACERONE ) 200 mg tablet, TAKE 1 TABLET BY MOUTH 2 TIMES DAILY FOR 2 WEEKS THEN TAKE 1 TABLET BY MOUTH DAILY., Disp: , Rfl:  .  carvediloL  (COREG ) 12.5 mg tablet, TAKE 1 TABLET (12.5 MG TOTAL) BY MOUTH 2 (TWO) TIMES DAILY WITH A MEAL., Disp: , Rfl:  .  Eliquis  5 mg tab, , Disp: , Rfl:  .  famotidine  (PEPCID ) 20 mg tablet, Take 20 mg by mouth., Disp: , Rfl:  .  Farxiga  10 mg tab tablet, TAKE 1 TABLET BY MOUTH DAILY BEFORE BREAKFAST., Disp: , Rfl:  .  ferrous sulfate, dried (Slow Release Iron) 160 mg (50 mg iron) TbER, Take  by mouth., Disp: , Rfl:  .  L. acidophilus-L. rhamnosus (Florajen Women) 15 billion cell cap, Take  by mouth., Disp: , Rfl:  .  losartan  (COZAAR ) 25 mg tablet, Take 25 mg by mouth 2 (two) times a day., Disp: , Rfl:  .  multivitamin with minerals (Daily Multivitamin-Minerals) tab, Take  by mouth., Disp: , Rfl:  .   pantoprazole  (PROTONIX ) 40 mg EC tablet, Take 40 mg by mouth Once Daily., Disp: , Rfl:      Medication Request/Refills: Pharmacy Information (if applicable)   [] Not Applicable       []  Pharmacy listed  Send Medication Request to:                                                 [] Pharmacy not listed (added to pharmacy list in Epic) Send Medication Request to:      Listed Pharmacies: [id number] - [place], [place] - [address] - PHONE: [phone] - FAX: [phone]

## 2024-10-01 NOTE — Telephone Encounter (Signed)
 Spoke to pt advised to go to the ER pt expressed she didn't want to go to the ER explained with the levels that pt may need a blood transfusion like provider stated she stated she would think about it.

## 2024-10-01 NOTE — ED Notes (Signed)
 Blood bank has 2 units of blood ready.  Kelli Webster is aware.

## 2024-10-01 NOTE — H&P (Signed)
 History and Physical    Patient: Kelli Webster FMW:[id number] DOB: 08/14/1946 DOA: 10/01/2024 DOS: the patient was seen and examined on 10/01/2024 PCP: [facility]  Patient coming from: Home.  Lives with husband.  Uses rolling walker at baseline.  Chief Complaint:  Chief Complaint  Patient presents with   abnormal labs    HPI: Kelli Webster is a 78 y.o. female with PMH of combined CHF, A-fib/flutter on Eliquis , CKD-3B, morbid obesity, hypertension, OSA, HTN, primary hyperparathyroidism and osteoporosis sent to ED due to low hemoglobin.  Patient had a blood work for upcoming parathyroidectomy next week and received a call stating that her hemoglobin is low.  She has some chronic dyspnea that has gotten worse over the last few weeks.  Also reports fatigue.  Denies chest pain or lightheadedness.  Denies melena, hematochezia or bleeding elsewhere.  She is on Eliquis  for A-fib.  Last dose earlier this morning.  She denies fever, chills, runny nose, sore throat, nausea, vomiting, abdominal pain, diarrhea or UTI symptoms.  Denies NSAID use.  No prior EGD or colonoscopy.  Patient lives with husband.  Uses rolling walker for ambulation.  Denies smoking cigarette, drinking alcohol recreational drug use.  She is interested in cardiopulmonary resuscitation in event of sudden cardiopulmonary arrest.  In ED, HR in upper 50s.  Some soft BPs but normotensive for most part.  Hgb 7.1 (was 7.1 yesterday and 9.7 about a year ago).  Hemoccult positive.  Uncorrected calcium 11.8.  Creatinine 1.71 (above baseline).  Hemoccult positive.  2 units of blood ordered.  [facility] consulted.  Admission requested for symptomatic anemia with possible GI bleed.   Review of Systems: As mentioned in the history of present illness. All other systems reviewed and are negative. Past Medical History:  Diagnosis Date   Chronic kidney disease    CKD3a   Chronic systolic heart failure (HCC)    a. ECHO  (09/2013) EF 20-25% diff HK, mild/mod MR, LA mod dil, RV mildly dil, RA mildly dil b. R/LHC (09/26/13) AO 133/88, LV 137/17, Lmain no dz, LAD 30% prox, LCX mild luminal irreg; RCA mild luminal irreg   Dysrhythmia    A.fib   had cardioversions and ablations   GERD (gastroesophageal reflux disease)    a. 09/2013 endoscopy nl   Hypertension    Morbid obesity (HCC)    Osteoarthritis    Persistent atrial fibrillation (HCC) Chads2vascscore of at least 4(02/12/15)   a. Dx 09/2013 b. s/p PVI by Dr Kelsie 11-26-2013   Sleep apnea    No CPAP d/t claustrophobia   Past Surgical History:  Procedure Laterality Date   ABLATION  11/26/2013   PVI by Dr Kelsie   arthroscopy       left knee     ATRIAL FIBRILLATION ABLATION N/A 11/26/2013   Procedure: ATRIAL FIBRILLATION ABLATION;  Surgeon: Lynwood JONETTA Kelsie, MD;  Location: [facility] CATH LAB;  Service: Cardiovascular;  Laterality: N/A;   ATRIAL FIBRILLATION ABLATION N/A 02/18/2021   Procedure: ATRIAL FIBRILLATION ABLATION;  Surgeon: Kelsie Lynwood, MD;  Location: [facility] INVASIVE CV LAB;  Service: Cardiovascular;  Laterality: N/A;   CARDIOVERSION N/A 10/01/2013   Procedure: CARDIOVERSION;  Surgeon: Ezra GORMAN Shuck, MD;  Location: [facility] ENDOSCOPY;  Service: Cardiovascular;  Laterality: N/A;   CARDIOVERSION N/A 10/17/2013   Procedure: CARDIOVERSION;  Surgeon: Ezra GORMAN Shuck, MD;  Location: [facility] ENDOSCOPY;  Service: Cardiovascular;  Laterality: N/A;   CARDIOVERSION N/A 02/20/2019   Procedure: CARDIOVERSION;  Surgeon: Shuck Ezra GORMAN, MD;  Location: [facility] ENDOSCOPY;  Service: Cardiovascular;  Laterality: N/A;   CARDIOVERSION N/A 11/19/2020   Procedure: CARDIOVERSION;  Surgeon: Rolan Ezra RAMAN, MD;  Location: [facility] ENDOSCOPY;  Service: Cardiovascular;  Laterality: N/A;   CARDIOVERSION N/A 12/25/2020   Procedure: CARDIOVERSION;  Surgeon: Rolan Ezra RAMAN, MD;  Location: [facility] ENDOSCOPY;  Service: Cardiovascular;  Laterality: N/A;   CARDIOVERSION N/A 11/17/2021   Procedure: CARDIOVERSION;   Surgeon: Rolan Ezra RAMAN, MD;  Location: [facility] ENDOSCOPY;  Service: Cardiovascular;  Laterality: N/A;   ESOPHAGOGASTRODUODENOSCOPY N/A 09/25/2013   Procedure: ESOPHAGOGASTRODUODENOSCOPY (EGD);  Surgeon: Lesta JULIANNA Fitz, MD;  Location: [facility] ENDOSCOPY;  Service: Endoscopy;  Laterality: N/A;   fx left wrist     open reduction & internal fixaction   LEFT HEART CATHETERIZATION WITH CORONARY ANGIOGRAM  09/26/2013   Procedure: LEFT HEART CATHETERIZATION WITH CORONARY ANGIOGRAM;  Surgeon: Ezra RAMAN Rolan, MD;  Location: [facility] CATH LAB;  Service: Cardiovascular;;   TEE WITHOUT CARDIOVERSION N/A 10/01/2013   Procedure: TRANSESOPHAGEAL ECHOCARDIOGRAM (TEE);  Surgeon: Ezra RAMAN Rolan, MD;  Location: [facility] ENDOSCOPY;  Service: Cardiovascular;  Laterality: N/A;   TEE WITHOUT CARDIOVERSION N/A 11/26/2013   Procedure: TRANSESOPHAGEAL ECHOCARDIOGRAM (TEE);  Surgeon: Ezra RAMAN Rolan, MD;  Location: [facility] ENDOSCOPY;  Service: Cardiovascular;  Laterality: N/A;   TONSILLECTOMY     age 52   Social History:  reports that she has never smoked. She has never used smokeless tobacco. She reports that she does not drink alcohol and does not use drugs.  No Known Allergies  Family History  Problem Relation Age of Onset   Heart attack Father         multiple MI's   Healthy Mother    Healthy Brother    Healthy Sister    Healthy Sister     Prior to Admission medications   Medication Sig Start Date End Date Taking? Authorizing Provider  acetaminophen  (TYLENOL ) 325 MG tablet Take 325-650 mg by mouth every 6 (six) hours as needed (pain.).    [provider]  amiodarone  (PACERONE ) 200 MG tablet Take 200 mg Twice daily for 1 week then 200 mg Daily Patient taking differently: Take 200 mg by mouth in the morning. 09/19/24   Rolan Ezra RAMAN, MD  carvedilol  (COREG ) 3.125 MG tablet TAKE 1 TABLET BY MOUTH 2 TIMES DAILY. 09/16/24   Rolan Ezra RAMAN, MD  ELIQUIS  5 MG TABS tablet TAKE 1 TABLET BY MOUTH TWICE  DAILY 04/02/24   McLean,  Dalton S, MD  famotidine  (PEPCID ) 20 MG tablet Take 20 mg by mouth daily as needed for heartburn or indigestion.    [provider]  FARXIGA  10 MG TABS tablet Take 1 tablet (10 mg total) by mouth daily before breakfast. 09/27/23   Rolan Ezra RAMAN, MD  losartan  (COZAAR ) 25 MG tablet Take 1 tablet (25 mg total) by mouth daily. 04/09/24   Glena Harlene HERO, FNP  MAGNESIUM PO Take 1 capsule by mouth every evening.    [provider]  Multiple Vitamin (MULTIVITAMIN WITH MINERALS) TABS tablet Take 1 tablet by mouth in the morning. Senior Multivitamin    [provider]  spironolactone  (ALDACTONE ) 25 MG tablet Take 1 tablet (25 mg total) by mouth daily. Patient taking differently: Take 25 mg by mouth at bedtime. 09/19/24 12/18/24  Rolan Ezra RAMAN, MD    Physical Exam: Vitals:   10/01/24 1745 10/01/24 1800 10/01/24 1815 10/01/24 1830  BP: (!) 125/54 (!) 124/50 (!) 126/50 131/65  Pulse: (!) 58 (!) 54 (!) 57 ROLLEN)  57  Resp: 19 15 (!) 21 17  Temp: (!) 97.3 F (36.3 C)     TempSrc: Oral     SpO2: 99% 99% 99% 98%   GENERAL: No apparent distress.  Nontoxic. HEENT: MMM.  Vision and hearing grossly intact.  NECK: Supple.  No apparent JVD.  RESP:  No IWOB.  Fair aeration bilaterally. CVS:  RRR. Heart sounds normal.  ABD/GI/GU: BS+. Abd soft, NTND.  MSK/EXT:   No apparent deformity. Moves extremities. No edema.  SKIN: no apparent skin lesion or wound NEURO: Awake and alert. Oriented appropriately.  No apparent focal neuro deficit. PSYCH: Calm. Normal affect.  Data Reviewed: See HPI  Assessment and Plan: Symptomatic blood loss anemia/GIB: Hemoglobin 7.1 yesterday and today.  Was 9.7 about a year ago.  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  She also have CKD-3B.  No report of melena or hematochezia.  Patient is on Eliquis  for A-fib.  Anemia panel with severe iron deficiency.  No prior EGD or colonoscopy.  Units of blood ordered in ED.  Already received 1 unit.  GI  consulted. -Hold Eliquis  -Proceed with 2 units -IV iron -IV Protonix  40 mg twice daily -CLD and n.p.o. after midnight  Paroxysmal A-fib: Rate controlled.  On amiodarone , Coreg  and Eliquis  - Holding Eliquis  in the setting of GI bleed - Will resume amiodarone  and Coreg  after med rec  Chronic combined CHF: TTE in 09/2023 with LVEF of 40 to 45% (35 to 40% in 2022), G1 DD.  She has chronic progressive dyspnea with fatigue but no orthopnea, PND or edema.  Appears euvolemic on exam.  Not on diuretics other than Aldactone  and Farxiga . - Resume home meds after med rec - Monitor for fluid and respiratory status  Primary hyperparathyroidism/hypercalcemia: Ca 11.8 (about baseline). - Plan for parathyroidectomy next week - Check albumin level - Outpatient follow-up with surgery   CKD-3B: Stable - Continue monitoring  OSA not on CPAP  Class I obesity: BMI 35.43.    Advance Care Planning:   Code Status: Full Code -discussed with patient and patient's husband at bedside  Consults: [facility]  Family Communication: Updated patient's husband at bedside  Severity of Illness: The appropriate patient status for this patient is INPATIENT. Inpatient status is judged to be reasonable and necessary in order to provide the required intensity of service to ensure the patient's safety. The patient's presenting symptoms, physical exam findings, and initial radiographic and laboratory data in the context of their chronic comorbidities is felt to place them at high risk for further clinical deterioration. Furthermore, it is not anticipated that the patient will be medically stable for discharge from the hospital within 2 midnights of admission.   * I certify that at the point of admission it is my clinical judgment that the patient will require inpatient hospital care spanning beyond 2 midnights from the point of admission due to high intensity of service, high risk for further deterioration and high frequency  of surveillance required.*  Author: Mignon ONEIDA Bump, MD 10/01/2024 7:05 PM  For on call review [email or web address].

## 2024-10-02 ENCOUNTER — Inpatient Hospital Stay (HOSPITAL_COMMUNITY)

## 2024-10-02 DIAGNOSIS — I5022 Chronic systolic (congestive) heart failure: Secondary | ICD-10-CM | POA: Diagnosis not present

## 2024-10-02 DIAGNOSIS — D649 Anemia, unspecified: Secondary | ICD-10-CM | POA: Diagnosis not present

## 2024-10-02 LAB — TYPE AND SCREEN
ABO/RH(D): A POS
Antibody Screen: NEGATIVE
Unit division: 0
Unit division: 0

## 2024-10-02 LAB — ECHOCARDIOGRAM COMPLETE
AR max vel: 1.29 cm2
AV Area VTI: 1.29 cm2
AV Area mean vel: 1.46 cm2
AV Mean grad: 7.5 mmHg
AV Peak grad: 16.4 mmHg
Ao pk vel: 2.02 m/s
Area-P 1/2: 3.14 cm2
Calc EF: 46.5 %
Height: 63 in
MV M vel: 5.32 m/s
MV Peak grad: 113.2 mmHg
MV VTI: 1.56 cm2
P 1/2 time: 694 ms
Radius: 0.4 cm
S' Lateral: 4.1 cm
Single Plane A2C EF: 40.7 %
Single Plane A4C EF: 53.8 %
Weight: 3537.94 [oz_av]

## 2024-10-02 LAB — CBC
HCT: 31 % — ABNORMAL LOW (ref 36.0–46.0)
Hemoglobin: 9 g/dL — ABNORMAL LOW (ref 12.0–15.0)
MCH: 22 pg — ABNORMAL LOW (ref 26.0–34.0)
MCHC: 29 g/dL — ABNORMAL LOW (ref 30.0–36.0)
MCV: 75.6 fL — ABNORMAL LOW (ref 80.0–100.0)
Platelets: 448 K/uL — ABNORMAL HIGH (ref 150–400)
RBC: 4.1 MIL/uL (ref 3.87–5.11)
RDW: 19 % — ABNORMAL HIGH (ref 11.5–15.5)
WBC: 6.6 K/uL (ref 4.0–10.5)
nRBC: 0.5 % — ABNORMAL HIGH (ref 0.0–0.2)

## 2024-10-02 LAB — RETICULOCYTES
Immature Retic Fract: 29.2 % — ABNORMAL HIGH (ref 2.3–15.9)
RBC.: 4.09 MIL/uL (ref 3.87–5.11)
Retic Count, Absolute: 73.2 K/uL (ref 19.0–186.0)
Retic Ct Pct: 1.8 % (ref 0.4–3.1)

## 2024-10-02 LAB — BPAM RBC
Blood Product Expiration Date: 202511022359
Blood Product Expiration Date: 202511022359
ISSUE DATE / TIME: 202510141542
ISSUE DATE / TIME: 202510141950
Unit Type and Rh: 6200
Unit Type and Rh: 6200

## 2024-10-02 LAB — FOLATE: Folate: >20 ng/mL (ref >5.9)

## 2024-10-02 LAB — BASIC METABOLIC PANEL WITH GFR
Anion gap: 10 (ref 5–15)
BUN: 22 mg/dL (ref 8–23)
CO2: 23 mmol/L (ref 22–32)
Calcium: 11.1 mg/dL — ABNORMAL HIGH (ref 8.9–10.3)
Chloride: 107 mmol/L (ref 98–111)
Creatinine, Ser: 1.45 mg/dL — ABNORMAL HIGH (ref 0.44–1.00)
GFR, Estimated: 37 mL/min — ABNORMAL LOW (ref >60)
Glucose, Bld: 85 mg/dL (ref 70–99)
Potassium: 4.1 mmol/L (ref 3.5–5.1)
Sodium: 140 mmol/L (ref 135–145)

## 2024-10-02 LAB — CBC WITH DIFFERENTIAL/PLATELET
Abs Immature Granulocytes: 0.08 K/uL — ABNORMAL HIGH (ref 0.00–0.07)
Basophils Absolute: 0.1 K/uL (ref 0.0–0.1)
Basophils Relative: 1 %
Eosinophils Absolute: 0.1 K/uL (ref 0.0–0.5)
Eosinophils Relative: 2 %
HCT: 33.2 % — ABNORMAL LOW (ref 36.0–46.0)
Hemoglobin: 9.8 g/dL — ABNORMAL LOW (ref 12.0–15.0)
Immature Granulocytes: 1 %
Lymphocytes Relative: 22 %
Lymphs Abs: 1.8 K/uL (ref 0.7–4.0)
MCH: 22.6 pg — ABNORMAL LOW (ref 26.0–34.0)
MCHC: 29.5 g/dL — ABNORMAL LOW (ref 30.0–36.0)
MCV: 76.5 fL — ABNORMAL LOW (ref 80.0–100.0)
Monocytes Absolute: 0.8 K/uL (ref 0.1–1.0)
Monocytes Relative: 10 %
Neutro Abs: 5.3 K/uL (ref 1.7–7.7)
Neutrophils Relative %: 64 %
Platelets: 445 K/uL — ABNORMAL HIGH (ref 150–400)
RBC: 4.34 MIL/uL (ref 3.87–5.11)
RDW: 19.6 % — ABNORMAL HIGH (ref 11.5–15.5)
WBC: 8.1 K/uL (ref 4.0–10.5)
nRBC: 0.4 % — ABNORMAL HIGH (ref 0.0–0.2)

## 2024-10-02 LAB — ALBUMIN: Albumin: 3.5 g/dL (ref 3.5–5.0)

## 2024-10-02 MED ORDER — FERROUS SULFATE 325 (65 FE) MG PO TABS
325.0000 mg | ORAL_TABLET | Freq: Every day | ORAL | Status: DC
  Administered 2024-10-02: 325 mg via ORAL
  Filled 2024-10-02: qty 1

## 2024-10-02 MED ORDER — PEG 3350-KCL-NA BICARB-NACL 420 G PO SOLR
2000.0000 mL | Freq: Once | ORAL | Status: AC
  Administered 2024-10-03: 2000 mL via ORAL

## 2024-10-02 MED ORDER — SODIUM CHLORIDE 0.9 % IV SOLN: INTRAVENOUS | Status: DC

## 2024-10-02 MED ORDER — PEG 3350-KCL-NA BICARB-NACL 420 G PO SOLR
2000.0000 mL | Freq: Once | ORAL | Status: AC
  Administered 2024-10-02: 2000 mL via ORAL
  Filled 2024-10-02: qty 4000

## 2024-10-02 NOTE — TOC Initial Note (Signed)
 Transition of Care [facility]) - Initial/Assessment Note    Patient Details  Name: Kelli Webster MRN: [id number] Date of Birth: October 11, 1946  [facility] CM/SW Contact:    Alfonse JONELLE Rex, RN Phone Number: 10/02/2024, 1:19 PM  Clinical Narrative:   Met with patient and spouse at bedside to introduce role of TOC/NCM and review for dc planning, patient confirmed she has an established PCP, no current home care services. Home DME: RW. Patient reports she resides with her spouse and feels safe returning home. TOC will continue to follow.                 Expected Discharge Plan: Home/Self Care Barriers to Discharge: Continued Medical Work up   Patient Goals and CMS Choice Patient states their goals for this hospitalization and ongoing recovery are:: return home          Expected Discharge Plan and Services       Living arrangements for the past 2 months: Single Family Home                                      Prior Living Arrangements/Services Living arrangements for the past 2 months: Single Family Home Lives with:: Spouse Patient language and need for interpreter reviewed:: Yes Do you feel safe going back to the place where you live?: Yes      Need for Family Participation in Patient Care: Yes (Comment) Care giver support system in place?: Yes (comment) Current home services: DME (RW) Criminal Activity/Legal Involvement Pertinent to Current Situation/Hospitalization: No - Comment as needed  Activities of Daily Living   ADL Screening (condition at time of admission) Independently performs ADLs?: Yes (appropriate for developmental age) Is the patient deaf or have difficulty hearing?: No Does the patient have difficulty seeing, even when wearing glasses/contacts?: No Does the patient have difficulty concentrating, remembering, or making decisions?: No  Permission Sought/Granted                  Emotional Assessment Appearance:: Appears stated  age Attitude/Demeanor/Rapport: Engaged Affect (typically observed): Accepting Orientation: : Oriented to Self, Oriented to Place, Oriented to  Time, Oriented to Situation Alcohol / Substance Use: Not Applicable Psych Involvement: No (comment)  Admission diagnosis:  Symptomatic anemia [D64.9] Gastrointestinal hemorrhage, unspecified gastrointestinal hemorrhage type [K92.2] Patient Active Problem List   Diagnosis Date Noted   Osteoporosis 10/01/2024   Symptomatic anemia 10/01/2024   Chronic kidney disease 09/19/2023   Hypotension 09/19/2023   Atrial flutter with rapid ventricular response (HCC) 09/18/2023   Morbid obesity (HCC) 09/15/2014   OSA (obstructive sleep apnea) 01/03/2014   Atrial fibrillation (HCC) 10/10/2013   Chronic combined systolic and diastolic heart failure (HCC) 10/10/2013   PCP:  [facility] Pharmacy:   [id number] - [place], [place] - [address] [place] [address] Phone: [phone] Fax: [phone]     Social Drivers of Health (SDOH) Social History: SDOH Screenings   Food Insecurity: No Food Insecurity (10/02/2024)  Housing: Low Risk  (10/02/2024)  Transportation Needs: No Transportation Needs (10/02/2024)  Utilities: Not At Risk (10/02/2024)  Social Connections: Socially Integrated (10/02/2024)  Tobacco Use: Low Risk  (10/01/2024)   SDOH Interventions:     Readmission Risk Interventions    10/02/2024    1:18 PM  Readmission Risk Prevention Plan  Post  Dischage Appt Complete  Medication Screening Complete  Transportation Screening Complete

## 2024-10-02 NOTE — Plan of Care (Signed)

## 2024-10-02 NOTE — Progress Notes (Signed)
 [facility] Progress Note Patient: Kelli Webster FMW:[id number] DOB: [birth year]  DOA: 10/01/2024 DOS: the patient was seen and examined on 10/02/2024  Brief Hospital Course: Patient with PMH of Chronic combined CHF, Eliquis  for A-fib, CKD 3B, OSA, HTN, primary hyperparathyroidism with recently scheduled outpatient surgery, obesity Presented to the hospital with complaints of abnormal lab. Received 1 PRBC transfusion.  [facility] consulted.  Will monitor recommendation.  Assessment and Plan: Symptomatic blood loss anemia. Likely chronic in nature. Etiology not clear. Patient never had a colonoscopy. Initially patient was planning to get procedure done outpatient although after further discussion patient currently agreeable to consider EGD colonoscopy in the hospital. Hemoglobin was 7.1 upon admission, improved to 9, baseline appears to be around 11 for the patient. Iron level was low which was replaced. Continue with PPI continue with clear liquid diet.  N.p.o. after midnight.  Paroxysmal A-fib. On anticoagulation. Currently appears to be rate controlled. Continue amiodarone  and Coreg .   Primary hyperparathyroidism. Mild hypercalcemia. Currently scheduled for outpatient procedure. Will monitor.  Chronic combined systolic and diastolic CHF. Echocardiogram shows EF of 40 to 45% with grade 1 diastolic dysfunction. Does not appear to be severely volume overloaded. Will monitor.  Obesity Class 1 Body mass index is 39.17 kg/m.  Placing the pt at higher risk of poor outcomes.  Subjective: No nausea no vomiting.  Denies any blood in the stool.  No fever no chills.  Has not taken her Eliquis  since yesterday morning.  Physical Exam: Clear to auscultation. S1-S2 present Bowel sound present Nontender. No edema.  Data Reviewed: I have Reviewed nursing notes, Vitals, and Lab results. Since last encounter, pertinent lab results CBC and BMP   . I have ordered test including CBC and  BMP  . I have discussed pt's care plan and test results with [facility]  .   Disposition: Status is: Inpatient Remains inpatient appropriate because: Monitor for improvement in H&H and further workup  SCDs Start: 10/01/24 1850   Family Communication: Family at bedside Level of care: Telemetry   Vitals:   10/02/24 0500 10/02/24 0841 10/02/24 0923 10/02/24 1623  BP:  (!) 135/58 (!) 144/63 (!) 145/54  Pulse:  69 68 64  Resp:  18 16 18   Temp:  99.1 F (37.3 C) 98.6 F (37 C) 98.6 F (37 C)  TempSrc:      SpO2:  93% 98% 96%  Weight: 100.3 kg     Height:         Author: Yetta Blanch, MD 10/02/2024 6:26 PM  Please look on [email or web address] to find out who is on call.

## 2024-10-02 NOTE — Anesthesia Preprocedure Evaluation (Signed)
 Anesthesia Evaluation  Patient identified by MRN, date of birth, ID band Patient awake    Reviewed: Allergy & Precautions, NPO status , Patient's Chart, lab work & pertinent test results, reviewed documented beta blocker date and time   History of Anesthesia Complications Negative for: history of anesthetic complications  Airway Mallampati: II  TM Distance: >3 FB Neck ROM: Full    Dental no notable dental hx.    Pulmonary sleep apnea    Pulmonary exam normal        Cardiovascular hypertension, Pt. on medications and Pt. on home beta blockers +CHF (LVEF 30-35%, grade 3 diastolic dysfunction)  Normal cardiovascular exam+ dysrhythmias (eliquis ) Atrial Fibrillation + Valvular Problems/Murmurs (mild MR) MR   Echo 10/02/24:  1. Left ventricular ejection fraction, by estimation, is 30 to 35%. The  left ventricle has moderately decreased function. The left ventricle  demonstrates global hypokinesis. There is mild left ventricular  hypertrophy. Left ventricular diastolic  parameters are consistent with Grade III diastolic dysfunction  (restrictive).   2. Right ventricular systolic function is normal. The right ventricular  size is normal.   3. Left atrial size was severely dilated.   4. The mitral valve is normal in structure. Mild mitral valve  regurgitation. No evidence of mitral stenosis.   5. The aortic valve is tricuspid. Aortic valve regurgitation is trivial.  No aortic stenosis is present.   6. The inferior vena cava is normal in size with greater than 50%  respiratory variability, suggesting right atrial pressure of 3 mmHg.     Neuro/Psych negative neurological ROS     GI/Hepatic Neg liver ROS,GERD  Controlled,,  Endo/Other  Scheduled for parathyroidectomy on 10/20 when outpatient labs revealed anemia- sent in for GI w/u, given 1 unit prbcs, has never had cscope  Obesity BMI 39  Renal/GU Renal InsufficiencyRenal  disease (cr 1.45, which appears better than her baseline)     Musculoskeletal  (+) Arthritis ,    Abdominal   Peds  Hematology  (+) Blood dyscrasia, anemia Hb 9.8 (7.1 on admission, no s/p 1 unit prbc)   Anesthesia Other Findings   Reproductive/Obstetrics                              Anesthesia Physical Anesthesia Plan  ASA: 3  Anesthesia Plan: MAC   Post-op Pain Management: Minimal or no pain anticipated   Induction:   PONV Risk Score and Plan: Treatment may vary due to age or medical condition and Propofol  infusion  Airway Management Planned: Natural Airway and Nasal Cannula  Additional Equipment: None  Intra-op Plan:   Post-operative Plan:   Informed Consent: I have reviewed the patients History and Physical, chart, labs and discussed the procedure including the risks, benefits and alternatives for the proposed anesthesia with the patient or authorized representative who has indicated his/her understanding and acceptance.       Plan Discussed with: CRNA  Anesthesia Plan Comments: (Baseline EF 40-45% with grade 1 diastolic dysfunction, but echo this admission shows worsening of both systolic and diastolic dysfunction- needs cardiac eval before EGD as Hb is stable and her symptomatic anemia may actually be acute decompensated HF-related.  Cardiology has seen patient today, euvolemic on exam, optimized per cardiologist. Lawence, MD)         Anesthesia Quick Evaluation

## 2024-10-02 NOTE — Consult Note (Signed)
 Referring Provider: TH Primary Care Physician:  [facility] Gastroenterologist: Sampson  Reason for Consultation: GI bleed  HPI: Kelli Webster is a 78 y.o. female with past medical history of atrial fibrillation currently on Eliquis , CHF with EF of 40 to 45% in November 2024,, morbid obesity, hypertension and obstructive sleep apnea presented to the hospital for abnormal lab concerning for anemia.  She was also complaining of shortness of breath. Initial blood work yesterday showed anemia with hemoglobin of 7.1.  Hemoglobin was in range of 10 in September 2024.  CMP showed mild elevated creatinine at 1.56 elevated calcium otherwise normal CMP. Iron studies consistent with iron deficiency anemia.  Normal vitamin B12 and folate level.  Occult blood positive.  Patient seen and examined at bedside.  She denies seeing any blood in the stool or black stool.  Denies any abdominal pain, nausea or vomiting.  Denies any fever or chills.  No previous colonoscopy.  No family history of colon cancer.  Past Medical History:  Diagnosis Date   Chronic kidney disease    CKD3a   Chronic systolic heart failure (HCC)    a. ECHO (09/2013) EF 20-25% diff HK, mild/mod MR, LA mod dil, RV mildly dil, RA mildly dil b. R/LHC (09/26/13) AO 133/88, LV 137/17, Lmain no dz, LAD 30% prox, LCX mild luminal irreg; RCA mild luminal irreg   Dysrhythmia    A.fib   had cardioversions and ablations   GERD (gastroesophageal reflux disease)    a. 09/2013 endoscopy nl   Hypertension    Morbid obesity (HCC)    Osteoarthritis    Persistent atrial fibrillation (HCC) Chads2vascscore of at least 4(02/12/15)   a. Dx 09/2013 b. s/p PVI by Dr Kelsie 11-26-2013   Sleep apnea    No CPAP d/t claustrophobia    Past Surgical History:  Procedure Laterality Date   ABLATION  11/26/2013   PVI by Dr Kelsie   arthroscopy       left knee     ATRIAL FIBRILLATION ABLATION N/A 11/26/2013   Procedure:  ATRIAL FIBRILLATION ABLATION;  Surgeon: Lynwood JONETTA Kelsie, MD;  Location: [facility] CATH LAB;  Service: Cardiovascular;  Laterality: N/A;   ATRIAL FIBRILLATION ABLATION N/A 02/18/2021   Procedure: ATRIAL FIBRILLATION ABLATION;  Surgeon: Kelsie Lynwood, MD;  Location: [facility] INVASIVE CV LAB;  Service: Cardiovascular;  Laterality: N/A;   CARDIOVERSION N/A 10/01/2013   Procedure: CARDIOVERSION;  Surgeon: Ezra GORMAN Shuck, MD;  Location: [facility] ENDOSCOPY;  Service: Cardiovascular;  Laterality: N/A;   CARDIOVERSION N/A 10/17/2013   Procedure: CARDIOVERSION;  Surgeon: Ezra GORMAN Shuck, MD;  Location: [facility] ENDOSCOPY;  Service: Cardiovascular;  Laterality: N/A;   CARDIOVERSION N/A 02/20/2019   Procedure: CARDIOVERSION;  Surgeon: Shuck Ezra GORMAN, MD;  Location: [facility] ENDOSCOPY;  Service: Cardiovascular;  Laterality: N/A;   CARDIOVERSION N/A 11/19/2020   Procedure: CARDIOVERSION;  Surgeon: Shuck Ezra GORMAN, MD;  Location: [facility] ENDOSCOPY;  Service: Cardiovascular;  Laterality: N/A;   CARDIOVERSION N/A 12/25/2020   Procedure: CARDIOVERSION;  Surgeon: Shuck Ezra GORMAN, MD;  Location: [facility] ENDOSCOPY;  Service: Cardiovascular;  Laterality: N/A;   CARDIOVERSION N/A 11/17/2021   Procedure: CARDIOVERSION;  Surgeon: Shuck Ezra GORMAN, MD;  Location: [facility] ENDOSCOPY;  Service: Cardiovascular;  Laterality: N/A;   ESOPHAGOGASTRODUODENOSCOPY N/A 09/25/2013   Procedure: ESOPHAGOGASTRODUODENOSCOPY (EGD);  Surgeon: Lesta JULIANNA Fitz, MD;  Location: [facility] ENDOSCOPY;  Service: Endoscopy;  Laterality: N/A;   fx left wrist     open reduction & internal fixaction   LEFT HEART CATHETERIZATION  WITH CORONARY ANGIOGRAM  09/26/2013   Procedure: LEFT HEART CATHETERIZATION WITH CORONARY ANGIOGRAM;  Surgeon: Ezra GORMAN Shuck, MD;  Location: [facility] CATH LAB;  Service: Cardiovascular;;   TEE WITHOUT CARDIOVERSION N/A 10/01/2013   Procedure: TRANSESOPHAGEAL ECHOCARDIOGRAM (TEE);  Surgeon: Ezra GORMAN Shuck, MD;  Location: [facility] ENDOSCOPY;  Service: Cardiovascular;  Laterality: N/A;    TEE WITHOUT CARDIOVERSION N/A 11/26/2013   Procedure: TRANSESOPHAGEAL ECHOCARDIOGRAM (TEE);  Surgeon: Ezra GORMAN Shuck, MD;  Location: [facility] ENDOSCOPY;  Service: Cardiovascular;  Laterality: N/A;   TONSILLECTOMY     age 31    Prior to Admission medications   Medication Sig Start Date End Date Taking? Authorizing Provider  amiodarone  (PACERONE ) 200 MG tablet Take 200 mg Twice daily for 1 week then 200 mg Daily Patient taking differently: Take 200 mg by mouth in the morning. 09/19/24  Yes Shuck Ezra GORMAN, MD  carvedilol  (COREG ) 3.125 MG tablet TAKE 1 TABLET BY MOUTH 2 TIMES DAILY. 09/16/24  Yes Shuck Ezra GORMAN, MD  ELIQUIS  5 MG TABS tablet TAKE 1 TABLET BY MOUTH TWICE  DAILY 04/02/24  Yes McLean, Dalton S, MD  famotidine  (PEPCID ) 20 MG tablet Take 20 mg by mouth daily as needed for heartburn or indigestion.   Yes [provider]  FARXIGA  10 MG TABS tablet Take 1 tablet (10 mg total) by mouth daily before breakfast. 09/27/23  Yes Shuck Ezra GORMAN, MD  losartan  (COZAAR ) 25 MG tablet Take 1 tablet (25 mg total) by mouth daily. 04/09/24  Yes Milford, Nettie, FNP  MAGNESIUM PO Take 1 tablet by mouth at bedtime.   Yes [provider]  Multiple Vitamins-Minerals (CENTRUM WOMEN) TABS Take 1 tablet by mouth daily with breakfast.   Yes [provider]  spironolactone  (ALDACTONE ) 25 MG tablet Take 1 tablet (25 mg total) by mouth daily. Patient taking differently: Take 25 mg by mouth at bedtime. 09/19/24 12/18/24 Yes Shuck Ezra GORMAN, MD  TYLENOL  500 MG tablet Take 500-1,000 mg by mouth every 6 (six) hours as needed (for pain or headaches).   Yes [provider]    Scheduled Meds:  amiodarone   200 mg Oral q AM   dapagliflozin  propanediol  10 mg Oral QAC breakfast   ferrous sulfate  325 mg Oral Q breakfast   losartan   25 mg Oral Daily   pantoprazole  (PROTONIX ) IV  40 mg Intravenous Q12H   spironolactone   25 mg Oral QHS   Continuous Infusions: PRN Meds:.mouth  rinse  Allergies as of 10/01/2024   (No Known Allergies)    Family History  Problem Relation Age of Onset   Heart attack Father         multiple MI's   Healthy Mother    Healthy Brother    Healthy Sister    Healthy Sister     Social History   Socioeconomic History   Marital status: Married    Spouse name: Not on file   Number of children: Not on file   Years of education: Not on file   Highest education level: Not on file  Occupational History   Not on file  Tobacco Use   Smoking status: Never   Smokeless tobacco: Never  Vaping Use   Vaping status: Never Used  Substance and Sexual Activity   Alcohol use: No   Drug use: No   Sexual activity: Yes    Birth control/protection: Post-menopausal  Other Topics Concern   Not on file  Social History Narrative   Lives in [place] with  husband.  Does not routinely exercise.   Realtor with [employer]   Social Drivers of Health   Financial Resource Strain: Not on file  Food Insecurity: No Food Insecurity (10/02/2024)   Hunger Vital Sign    Worried About Running Out of Food in the Last Year: Never true    Ran Out of Food in the Last Year: Never true  Transportation Needs: No Transportation Needs (10/02/2024)   PRAPARE - Administrator, Civil Service (Medical): No    Lack of Transportation (Non-Medical): No  Physical Activity: Not on file  Stress: Not on file  Social Connections: Socially Integrated (10/02/2024)   Social Connection and Isolation Panel    Frequency of Communication with Friends and Family: More than three times a week    Frequency of Social Gatherings with Friends and Family: More than three times a week    Attends Religious Services: More than 4 times per year    Active Member of [employer] or Organizations: Yes    Attends Engineer, structural: More than 4 times per year    Marital Status: Married  Catering manager Violence: Not At Risk (10/02/2024)   Humiliation, Afraid, Rape, and Kick  questionnaire    Fear of Current or Ex-Partner: No    Emotionally Abused: No    Physically Abused: No    Sexually Abused: No    Review of Systems: All negative except as stated above in HPI.  Physical Exam: Vital signs: Vitals:   10/02/24 0841 10/02/24 0923  BP: (!) 135/58 (!) 144/63  Pulse: 69 68  Resp: 18 16  Temp: 99.1 F (37.3 C) 98.6 F (37 C)  SpO2: 93% 98%   Last BM Date : 09/30/24 General:   Alert,  Well-developed, well-nourished, pleasant and cooperative in NAD Normocephalic, atraumatic Extraocular movement intact Lungs: No visible respiratory distress Heart:  Regular rate and rhythm; no murmurs, clicks, rubs,  or gallops. Abdomen: Soft, nontender, nondistended, bowel sound present, no peritoneal signs Mood and affect normal Alert and oriented x 3 Rectal:  Deferred  GI:  Lab Results: Recent Labs    09/30/24 1432 10/01/24 1337 10/02/24 0349  WBC 6.8 6.2 6.6  HGB 7.1* 7.1* 9.0*  HCT 26.8* 26.9* 31.0*  PLT 538* 532* 448*   BMET Recent Labs    09/30/24 1432 10/01/24 1337 10/02/24 0349  NA 140 139 140  K 5.0 4.5 4.1  CL 107 105 107  CO2 24 23 23   GLUCOSE 119* 140* 85  BUN 22 26* 22  CREATININE 1.56* 1.71* 1.45*  CALCIUM 11.7* 11.8* 11.1*   LFT Recent Labs    09/30/24 1432 10/02/24 0349  PROT 5.9*  --   ALBUMIN 3.8 3.5  AST 21  --   ALT 14  --   ALKPHOS 75  --   BILITOT 0.5  --    PT/INR No results for input(s): LABPROT, INR in the last 72 hours.   Studies/Results: DG Chest 2 View Result Date: 10/01/2024 EXAM: 2 VIEW(S) XRAY OF THE CHEST 10/01/2024 03:02:00 PM COMPARISON: 09/18/2023 CLINICAL HISTORY: shob. Pt reports that she has low hgb per her provider, pt is having shob. FINDINGS: LUNGS AND PLEURA: No focal pulmonary opacity. No pulmonary edema. No pleural effusion. No pneumothorax. HEART AND MEDIASTINUM: Stable cardiomegaly. BONES AND SOFT TISSUES: No acute osseous abnormality. IMPRESSION: 1. Stable cardiomegaly. Electronically  signed by: Lynwood Seip MD 10/01/2024 03:14 PM EDT RP Workstation: [id number]    Impression/Plan: -Acute blood loss anemia with heme  positive stool.  No overt bleeding.  Need to rule out underlying GI cause for bleeding given her need for anticoagulation use. - History of A-fib.  Last dose of Eliquis  yesterday morning. - History of CHF - Parathyroid  disorder.  Scheduled for surgery on Monday.  Recommendations ------------------------- - Patient initially declined for inpatient workup but subsequently she is agreeable for EGD and colonoscopy for tomorrow.  Recommend split prep with half gallon of prep this evening and remaining half tomorrow morning. - Change diet back to clear liquids diet.  Keep n.p.o. past midnight.  Continue to hold Eliquis . - Management options discussed with hospitalist as well as patient's family at bedside.  Risks (bleeding, infection, bowel perforation that could require surgery, sedation-related changes in cardiopulmonary systems), benefits (identification and possible treatment of source of symptoms, exclusion of certain causes of symptoms), and alternatives (watchful waiting, radiographic imaging studies, empiric medical treatment)  were explained to patient/family in detail and patient wishes to proceed.     LOS: 1 day   Layla Lah  MD, FACP 10/02/2024, 9:59 AM  Contact #  [phone]

## 2024-10-02 NOTE — Progress Notes (Signed)
  Echocardiogram 2D Echocardiogram has been performed.  Norleen ORN Lennis Korb 10/02/2024, 3:02 PM

## 2024-10-03 ENCOUNTER — Inpatient Hospital Stay (HOSPITAL_COMMUNITY)

## 2024-10-03 ENCOUNTER — Encounter (HOSPITAL_COMMUNITY): Admission: EM | Disposition: A | Payer: Self-pay | Source: Ambulatory Visit | Attending: Internal Medicine

## 2024-10-03 ENCOUNTER — Inpatient Hospital Stay (HOSPITAL_COMMUNITY): Admitting: Anesthesiology

## 2024-10-03 ENCOUNTER — Encounter (HOSPITAL_COMMUNITY): Admitting: Anesthesiology

## 2024-10-03 DIAGNOSIS — I5042 Chronic combined systolic (congestive) and diastolic (congestive) heart failure: Secondary | ICD-10-CM

## 2024-10-03 DIAGNOSIS — D649 Anemia, unspecified: Secondary | ICD-10-CM | POA: Diagnosis not present

## 2024-10-03 DIAGNOSIS — I5022 Chronic systolic (congestive) heart failure: Secondary | ICD-10-CM

## 2024-10-03 DIAGNOSIS — K2960 Other gastritis without bleeding: Secondary | ICD-10-CM | POA: Diagnosis not present

## 2024-10-03 DIAGNOSIS — K648 Other hemorrhoids: Secondary | ICD-10-CM | POA: Diagnosis not present

## 2024-10-03 DIAGNOSIS — I4891 Unspecified atrial fibrillation: Secondary | ICD-10-CM

## 2024-10-03 DIAGNOSIS — I480 Paroxysmal atrial fibrillation: Secondary | ICD-10-CM | POA: Diagnosis not present

## 2024-10-03 DIAGNOSIS — Z01810 Encounter for preprocedural cardiovascular examination: Secondary | ICD-10-CM

## 2024-10-03 HISTORY — PX: ESOPHAGOGASTRODUODENOSCOPY: SHX5428

## 2024-10-03 HISTORY — PX: COLONOSCOPY: SHX5424

## 2024-10-03 LAB — CBC WITH DIFFERENTIAL/PLATELET
Abs Immature Granulocytes: 0.06 K/uL (ref 0.00–0.07)
Basophils Absolute: 0.1 K/uL (ref 0.0–0.1)
Basophils Relative: 1 %
Eosinophils Absolute: 0.1 K/uL (ref 0.0–0.5)
Eosinophils Relative: 1 %
HCT: 35.9 % — ABNORMAL LOW (ref 36.0–46.0)
Hemoglobin: 10 g/dL — ABNORMAL LOW (ref 12.0–15.0)
Immature Granulocytes: 1 %
Lymphocytes Relative: 20 %
Lymphs Abs: 1.6 K/uL (ref 0.7–4.0)
MCH: 21.3 pg — ABNORMAL LOW (ref 26.0–34.0)
MCHC: 27.9 g/dL — ABNORMAL LOW (ref 30.0–36.0)
MCV: 76.5 fL — ABNORMAL LOW (ref 80.0–100.0)
Monocytes Absolute: 0.7 K/uL (ref 0.1–1.0)
Monocytes Relative: 9 %
Neutro Abs: 5.5 K/uL (ref 1.7–7.7)
Neutrophils Relative %: 68 %
Platelets: 506 K/uL — ABNORMAL HIGH (ref 150–400)
RBC: 4.69 MIL/uL (ref 3.87–5.11)
RDW: 20.2 % — ABNORMAL HIGH (ref 11.5–15.5)
WBC: 8 K/uL (ref 4.0–10.5)
nRBC: 0.3 % — ABNORMAL HIGH (ref 0.0–0.2)

## 2024-10-03 LAB — COMPREHENSIVE METABOLIC PANEL WITH GFR
ALT: 14 U/L (ref 0–44)
AST: 27 U/L (ref 15–41)
Albumin: 3.9 g/dL (ref 3.5–5.0)
Alkaline Phosphatase: 76 U/L (ref 38–126)
Anion gap: 12 (ref 5–15)
BUN: 20 mg/dL (ref 8–23)
CO2: 23 mmol/L (ref 22–32)
Calcium: 12.1 mg/dL — ABNORMAL HIGH (ref 8.9–10.3)
Chloride: 105 mmol/L (ref 98–111)
Creatinine, Ser: 1.53 mg/dL — ABNORMAL HIGH (ref 0.44–1.00)
GFR, Estimated: 35 mL/min — ABNORMAL LOW (ref >60)
Glucose, Bld: 86 mg/dL (ref 70–99)
Potassium: 4.3 mmol/L (ref 3.5–5.1)
Sodium: 140 mmol/L (ref 135–145)
Total Bilirubin: 1.3 mg/dL — ABNORMAL HIGH (ref 0.0–1.2)
Total Protein: 6.1 g/dL — ABNORMAL LOW (ref 6.5–8.1)

## 2024-10-03 LAB — MAGNESIUM: Magnesium: 2 mg/dL (ref 1.7–2.4)

## 2024-10-03 SURGERY — EGD (ESOPHAGOGASTRODUODENOSCOPY)
Anesthesia: Monitor Anesthesia Care

## 2024-10-03 MED ORDER — SODIUM CHLORIDE (PF) 0.9 % IJ SOLN
INTRAMUSCULAR | Status: AC
Start: 2024-10-03 — End: 2024-10-03
  Filled 2024-10-03: qty 50

## 2024-10-03 MED ORDER — PHENYLEPHRINE HCL (PRESSORS) 10 MG/ML IV SOLN
INTRAVENOUS | Status: DC | PRN
  Administered 2024-10-03: 100 ug via INTRAVENOUS

## 2024-10-03 MED ORDER — IOHEXOL 9 MG/ML PO SOLN
500.0000 mL | ORAL | Status: AC
  Administered 2024-10-03 (×2): 500 mL via ORAL

## 2024-10-03 MED ORDER — SPOT INK MARKER SYRINGE KIT
PACK | SUBMUCOSAL | Status: DC | PRN
  Administered 2024-10-03: 1 mL via SUBMUCOSAL

## 2024-10-03 MED ORDER — IOHEXOL 300 MG/ML  SOLN
100.0000 mL | Freq: Once | INTRAMUSCULAR | Status: AC | PRN
Start: 2024-10-03 — End: 2024-10-03
  Administered 2024-10-03: 80 mL via INTRAVENOUS

## 2024-10-03 MED ORDER — PROPOFOL 10 MG/ML IV BOLUS
INTRAVENOUS | Status: DC | PRN
  Administered 2024-10-03: 40 mg via INTRAVENOUS

## 2024-10-03 MED ORDER — PROPOFOL 500 MG/50ML IV EMUL
INTRAVENOUS | Status: DC | PRN
  Administered 2024-10-03: 70 ug/kg/min via INTRAVENOUS

## 2024-10-03 MED ORDER — SPOT INK MARKER SYRINGE KIT
PACK | SUBMUCOSAL | Status: AC
  Filled 2024-10-03: qty 5

## 2024-10-03 NOTE — Op Note (Signed)
 [facility] Patient Name: Kelli Webster Procedure Date: 10/03/2024 MRN: [id number] Attending MD: Layla Lah , MD, [id number] Date of Birth: Aug 31, 1946 CSN: [id number] Age: 79 Admit Type: Inpatient Procedure:                Upper GI endoscopy Indications:              Suspected upper gastrointestinal bleeding in                            patient with unexplained iron deficiency anemia Providers:                Layla Lah, MD, Jacquelyn Jaci Pierce, RN,                            Corky Czech, Technician, Ronnald Fish, CRNA Referring MD:              Medicines:                Sedation Administered by an Anesthesia Professional Complications:            No immediate complications. Estimated Blood Loss:     Estimated blood loss was minimal. Procedure:                Pre-Anesthesia Assessment:                           - Prior to the procedure, a History and Physical                            was performed, and patient medications and                            allergies were reviewed. The patient's tolerance of                            previous anesthesia was also reviewed. The risks                            and benefits of the procedure and the sedation                            options and risks were discussed with the patient.                            All questions were answered, and informed consent                            was obtained. Prior Anticoagulants: The patient has                            taken Eliquis  (apixaban ), last dose was 2 days                            prior to procedure. ASA Grade Assessment: III - A  patient with severe systemic disease. After                            reviewing the risks and benefits, the patient was                            deemed in satisfactory condition to undergo the                            procedure.                           After obtaining informed consent, the  endoscope was                            passed under direct vision. Throughout the                            procedure, the patient's blood pressure, pulse, and                            oxygen saturations were monitored continuously. The                            [id number]-H190 ([id number]) Olympus endoscope was introduced                            through the mouth, and advanced to the second part                            of duodenum. The upper GI endoscopy was                            accomplished without difficulty. The patient                            tolerated the procedure well. Scope In: Scope Out: Findings:      The Z-line was regular and was found 38 cm from the incisors.      Diffuse severe inflammation characterized by congestion (edema),       erosions, erythema and friability was found in the entire examined       stomach. Biopsies were taken with a cold forceps for histology.      The cardia and gastric fundus were normal on retroflexion.      The duodenal bulb, first portion of the duodenum and second portion of       the duodenum were normal. Impression:               - Z-line regular, 38 cm from the incisors.                           - Erosive gastritis. Biopsied.                           - Normal duodenal bulb, first portion of the  duodenum and second portion of the duodenum. Moderate Sedation:      Moderate (conscious) sedation was personally administered by an       anesthesia professional. The following parameters were monitored: oxygen       saturation, heart rate, blood pressure, and response to care. Recommendation:           - Perform a colonoscopy today. Procedure Code(s):        --- Professional ---                           [phone], Esophagogastroduodenoscopy, flexible,                            transoral; with biopsy, single or multiple Diagnosis Code(s):        --- Professional ---                           K29.60, Other  gastritis without bleeding                           D50.9, Iron deficiency anemia, unspecified CPT copyright 2022 American Medical Association. All rights reserved. The codes documented in this report are preliminary and upon coder review may  be revised to meet current compliance requirements. Layla Lah, MD Layla Lah, MD 10/03/2024 1:19:33 PM Number of Addenda: 0

## 2024-10-03 NOTE — Progress Notes (Signed)
 [facility] Gastroenterology Progress Note  Kelli Webster 78 y.o. Nov 20, 1946  CC: Anemia, heme positive stool   Subjective: Patient seen and examined at bedside.  Denies any acute GI issues.  Denies seeing any blood during colonoscopy prep.  ROS : Afebrile, negative for chest pain   Objective: Vital signs in last 24 hours: Vitals:   10/02/24 1623 10/02/24 2050  BP: (!) 145/54 129/60  Pulse: 64 65  Resp: 18 16  Temp: 98.6 F (37 C) (!) 96.5 F (35.8 C)  SpO2: 96% 98%    Physical Exam: Resting comfortably, not in acute distress.  Abdominal exam benign.  Mood and affect normal.  Alert and oriented x 3.  Lab Results: Recent Labs    10/02/24 0349 10/03/24 0545  NA 140 140  K 4.1 4.3  CL 107 105  CO2 23 23  GLUCOSE 85 86  BUN 22 20  CREATININE 1.45* 1.53*  CALCIUM 11.1* 12.1*  MG  --  2.0   Recent Labs    09/30/24 1432 10/02/24 0349 10/03/24 0545  AST 21  --  27  ALT 14  --  14  ALKPHOS 75  --  76  BILITOT 0.5  --  1.3*  PROT 5.9*  --  6.1*  ALBUMIN 3.8 3.5 3.9   Recent Labs    10/02/24 1731 10/03/24 0545  WBC 8.1 8.0  NEUTROABS 5.3 5.5  HGB 9.8* 10.0*  HCT 33.2* 35.9*  MCV 76.5* 76.5*  PLT 445* 506*   No results for input(s): LABPROT, INR in the last 72 hours.    Assessment/Plan: -Acute blood loss anemia with heme positive stool.  No overt bleeding.  Need to rule out underlying GI cause for bleeding given her need for anticoagulation use. - History of A-fib.  Last dose of Eliquis  prior to arrival - History of CHF.  Recent echocardiogram showed EF of 30 to 35%. - Parathyroid  disorder.  Scheduled for surgery on Monday.   Recommendations ------------------------- - Appreciate cardiology input.  Okay to proceed with planned EGD and colonoscopy today.  Risks (bleeding, infection, bowel perforation that could require surgery, sedation-related changes in cardiopulmonary systems), benefits (identification and possible treatment of source of symptoms,  exclusion of certain causes of symptoms), and alternatives (watchful waiting, radiographic imaging studies, empiric medical treatment)  were explained to patient/family in detail and patient wishes to proceed.    Layla Lah MD, FACP 10/03/2024, 10:41 AM  Contact #  [phone]

## 2024-10-03 NOTE — Plan of Care (Signed)

## 2024-10-03 NOTE — Anesthesia Postprocedure Evaluation (Signed)
 Anesthesia Post Note  Patient: Kelli Webster  Procedure(s) Performed: EGD (ESOPHAGOGASTRODUODENOSCOPY) COLONOSCOPY     Patient location during evaluation: PACU Anesthesia Type: MAC Level of consciousness: awake and alert Pain management: pain level controlled Vital Signs Assessment: post-procedure vital signs reviewed and stable Respiratory status: spontaneous breathing, nonlabored ventilation and respiratory function stable Cardiovascular status: blood pressure returned to baseline Postop Assessment: no apparent nausea or vomiting Anesthetic complications: no   No notable events documented.  Last Vitals:  Vitals:   10/03/24 1330 10/03/24 1340  BP: 131/64 (!) 145/95  Pulse: 65 66  Resp: (!) 23 (!) 24  Temp:    SpO2: 98% 91%    Last Pain:  Vitals:   10/03/24 1315  TempSrc: Temporal  PainSc: 0-No pain                 Vertell Row

## 2024-10-03 NOTE — Transfer of Care (Signed)
 Immediate Anesthesia Transfer of Care Note  Patient: Kelli Webster  Procedure(s) Performed: EGD (ESOPHAGOGASTRODUODENOSCOPY) COLONOSCOPY  Patient Location: PACU  Anesthesia Type:MAC  Level of Consciousness: awake, alert , oriented, and patient cooperative  Airway & Oxygen Therapy: Patient Spontanous Breathing and Patient connected to face mask oxygen  Post-op Assessment: Report given to RN and Post -op Vital signs reviewed and stable  Post vital signs: Reviewed and stable  Last Vitals:  Vitals Value Taken Time  BP 118/63 10/03/24 13:16  Temp    Pulse 56 10/03/24 13:16  Resp 18 10/03/24 13:17  SpO2 98 % 10/03/24 13:16  Vitals shown include unfiled device data.  Last Pain:  Vitals:   10/03/24 1209  TempSrc: Temporal  PainSc: 0-No pain         Complications: No notable events documented.

## 2024-10-03 NOTE — Brief Op Note (Signed)
 10/03/2024  1:30 PM  PATIENT:  Kelli Webster  78 y.o. female  PRE-OPERATIVE DIAGNOSIS:  GI bleed, anemia  POST-OPERATIVE DIAGNOSIS:  egd: gastritis, gastric bx r/o h pylori colon: ileocecal valve r/o lipoma, ascending colon polypx2, transverse colon polyp x5, tattoo dye 1ml, hepatic flexure bx r/o malignancy, sigmoid colon polyp, rectal polyp  PROCEDURE:  Procedure(s): EGD (ESOPHAGOGASTRODUODENOSCOPY) (N/A) COLONOSCOPY (N/A)  SURGEON:  Surgeons and Role:    * Shakemia Madera, MD - Primary  Findings ------------ - EGD showed moderate to severe gastritis.  No evidence of active bleeding.  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  Colonoscopy also showed multiple other polyps which was removed with a cold snare, normal TI, diverticulosis and hemorrhoids.  Recommendations -------------------------- - Recommend CT chest abdomen pelvis with IV contrast - Check CEA - Recommend to hold anticoagulation for at least 48 hours because of removal of multiple colon polyps - Okay to advance diet as tolerated - Will need surgery consults once CT scan and biopsy results are back. - GI will follow  Layla Lah MD, FACP 10/03/2024, 1:32 PM  Contact #  [phone]

## 2024-10-03 NOTE — Op Note (Signed)
 [facility] Patient Name: Kelli Webster Procedure Date: 10/03/2024 MRN: [id number] Attending MD: Layla Lah , MD, [id number] Date of Birth: 1946/06/16 CSN: [id number] Age: 78 Admit Type: Inpatient Procedure:                Colonoscopy Indications:              This is the patient's first colonoscopy,                            Unexplained iron deficiency anemia Providers:                Layla Lah, MD, Jacquelyn Jaci Pierce, RN,                            Corky Czech, Technician, Ronnald Fish, CRNA Referring MD:              Medicines:                Sedation Administered by an Anesthesia Professional Complications:            No immediate complications. Estimated Blood Loss:     Estimated blood loss was minimal. Procedure:                Pre-Anesthesia Assessment:                           - Prior to the procedure, a History and Physical                            was performed, and patient medications and                            allergies were reviewed. The patient's tolerance of                            previous anesthesia was also reviewed. The risks                            and benefits of the procedure and the sedation                            options and risks were discussed with the patient.                            All questions were answered, and informed consent                            was obtained. Prior Anticoagulants: The patient has                            taken Eliquis  (apixaban ), last dose was 2 days                            prior to procedure. ASA Grade Assessment: III - A  patient with severe systemic disease. After                            reviewing the risks and benefits, the patient was                            deemed in satisfactory condition to undergo the                            procedure.                           After obtaining informed consent, the colonoscope                             was passed under direct vision. Throughout the                            procedure, the patient's blood pressure, pulse, and                            oxygen saturations were monitored continuously. The                            [id number]-[id number] ([id number]) colonoscope was introduced                            through the anus and advanced to the the terminal                            ileum, with identification of the appendiceal                            orifice and IC valve. The colonoscopy was performed                            without difficulty. The patient tolerated the                            procedure well. The quality of the bowel                            preparation was fair. The terminal ileum, ileocecal                            valve, appendiceal orifice, and rectum were                            photographed. Scope In: 12:47:26 PM Scope Out: 1:06:03 PM Scope Withdrawal Time: 0 hours 15 minutes 51 seconds  Total Procedure Duration: 0 hours 18 minutes 37 seconds  Findings:      Hemorrhoids were found on perianal exam.      The terminal ileum appeared normal.      The ileocecal valve was moderately lipomatous. Biopsies were taken with  a cold forceps for histology.      Two sessile polyps were found in the ascending colon. The polyps were 6       to 10 mm in size. These polyps were removed with a cold snare. Resection       and retrieval were complete.      A frond-like/villous, infiltrative and polypoid non-obstructing large       mass was found at the hepatic flexure. The mass was partially       circumferential (involving one-third of the lumen circumference). No       bleeding was present. Biopsies were taken with a cold forceps for       histology. Area was tattooed with an injection of dye.      Five sessile polyps were found in the transverse colon. The polyps were       6 to 10 mm in size. These polyps were removed with a cold snare.        Resection and retrieval were complete.      A 7 mm polyp was found in the sigmoid colon. The polyp was sessile. The       polyp was removed with a cold snare. Resection and retrieval were       complete.      A few diverticula were found in the sigmoid colon.      An 8 mm polyp was found in the rectum. The polyp was semi-sessile. The       polyp was removed with a cold snare. Resection and retrieval were       complete.      Internal hemorrhoids were found during retroflexion. The hemorrhoids       were small. Impression:               - Preparation of the colon was fair.                           - Hemorrhoids found on perianal exam.                           - The examined portion of the ileum was normal.                           - Lipomatous ileocecal valve. Biopsied.                           - Two 6 to 10 mm polyps in the ascending colon,                            removed with a cold snare. Resected and retrieved.                           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                            Biopsied. Tattooed.                           - Five 6 to 10 mm polyps in the transverse colon,  removed with a cold snare. Resected and retrieved.                           - One 7 mm polyp in the sigmoid colon, removed with                            a cold snare. Resected and retrieved.                           - Diverticulosis in the sigmoid colon.                           - One 8 mm polyp in the rectum, removed with a cold                            snare. Resected and retrieved.                           - Internal hemorrhoids. Moderate Sedation:      Moderate (conscious) sedation was personally administered by an       anesthesia professional. The following parameters were monitored: oxygen       saturation, heart rate, blood pressure, and response to care. Recommendation:           - Return patient to hospital ward for ongoing care.                            - Soft diet.                           - Continue present medications.                           - Resume Eliquis  (apixaban ) at prior dose in 2 days.                           - Refer to a colo-rectal surgeon at appointment to                            be scheduled. Procedure Code(s):        --- Professional ---                           [phone], Colonoscopy, flexible; with removal of                            tumor(s), polyp(s), or other lesion(s) by snare                            technique                           45380, 59, Colonoscopy, flexible; with biopsy,                            single or multiple  54618, Colonoscopy, flexible; with directed                            submucosal injection(s), any substance Diagnosis Code(s):        --- Professional ---                           K64.8, Other hemorrhoids                           D12.2, Benign neoplasm of ascending colon                           D12.3, Benign neoplasm of transverse colon (hepatic                            flexure or splenic flexure)                           D49.0, Neoplasm of unspecified behavior of                            digestive system                           D12.5, Benign neoplasm of sigmoid colon                           D12.8, Benign neoplasm of rectum                           D50.9, Iron deficiency anemia, unspecified CPT copyright 2022 [employer]. All rights reserved. The codes documented in this report are preliminary and upon coder review may  be revised to meet current compliance requirements. Layla Lah, MD Layla Lah, MD 10/03/2024 1:30:36 PM Number of Addenda: 0

## 2024-10-03 NOTE — Hospital Course (Addendum)
 Patient with PMH of Chronic combined CHF, Eliquis  for A-fib, CKD 3B, OSA, HTN, primary hyperparathyroidism with recently scheduled outpatient surgery, obesity Presented to the hospital with complaints of abnormal lab. Received 1 PRBC transfusion.  [facility] consulted.  Will monitor recommendation. Assessment and Plan: Symptomatic blood loss anemia. Status post EGD colonoscopy on 10/16. Colonic mass. Severe gastritis. Multiple colonic polyps. Likely chronic in nature. Patient never had a colonoscopy. Initially patient was planning to get procedure done outpatient although after further discussion patient was agreeable to EGD colonoscopy in the hospital. Hemoglobin was 7.1 upon admission, improved to 9, baseline appears to be around 11 for the patient. Iron level was low which was replaced. Continue with PPI EGD showed severe gastritis and colonoscopy shows hepatic mass. Appreciate GI consultation. CT chest abdomen pelvis shows hepatic flexure mass as well as some pulmonary nodules. CEA normal. Appreciate general surgery consultation. Will require surgical colectomy this admission. Scheduled for surgery on Monday at 7:15 AM. Discussed with cardiology, okay to hold anticoagulation for now.   Paroxysmal A-fib. On anticoagulation with Eliquis . Currently appears to be rate controlled. Continue amiodarone  and Coreg . Hold anticoagulation for now per my discussion with cardiology.   Primary hyperparathyroidism. Mild hypercalcemia. Currently scheduled for outpatient procedure.  Surgery will be now postponed until patient recovers from colectomy. Will monitor.   Chronic combined systolic and diastolic CHF. Echocardiogram shows EF of 40 to 45% with grade 1 diastolic dysfunction. Does not appear to be severely volume overloaded. Repeat echocardiogram shows worsening EF.  Appreciate cardio consultation.  No further workup right now. Will monitor.   Obesity Class 1 Body mass index is 37.18  kg/m.  Placing the pt at higher risk of poor outcomes.  Normal

## 2024-10-03 NOTE — Progress Notes (Addendum)
 [facility] Progress Note Patient: Kelli Webster FMW:[id number] DOB: 1946/10/07  DOA: 10/01/2024 DOS: the patient was seen and examined on 10/03/2024  Brief Hospital Course: Patient with PMH of Chronic combined CHF, Eliquis  for A-fib, CKD 3B, OSA, HTN, primary hyperparathyroidism with recently scheduled outpatient surgery, obesity Presented to the hospital with complaints of abnormal lab. Received 1 PRBC transfusion.  [facility] consulted.  Will monitor recommendation. Assessment and Plan: Symptomatic blood loss anemia. Status post EGD colonoscopy on 10/16. Colonic mass. Severe gastritis. Multiple colonic polyps. Likely chronic in nature. Patient never had a colonoscopy. Initially patient was planning to get procedure done outpatient although after further discussion patient was agreeable to EGD colonoscopy in the hospital. Hemoglobin was 7.1 upon admission, improved to 9, baseline appears to be around 11 for the patient. Iron level was low which was replaced. Continue with PPI EGD showed severe gastritis and colonoscopy shows hepatic mass. Appreciate GI consultation. CT chest abdomen pelvis ordered.  CEA ordered.  Will discuss with general surgery after CT chest results.   Paroxysmal A-fib. On anticoagulation with Eliquis . Currently appears to be rate controlled. Continue amiodarone  and Coreg . Hold anticoagulation for 48 hours due to multiple polyp removal.  Resume on 10/19 or after her parathyroidectomy which is scheduled on 10/20.   Primary hyperparathyroidism. Mild hypercalcemia. Currently scheduled for outpatient procedure. Will monitor.   Chronic combined systolic and diastolic CHF. Echocardiogram shows EF of 40 to 45% with grade 1 diastolic dysfunction. Does not appear to be severely volume overloaded. Repeat echocardiogram shows worsening EF.  Appreciate cardio consultation.  No further workup right now. Will monitor.   Obesity Class 1 Body mass index is 37.18  kg/m.  Placing the pt at higher risk of poor outcomes.  Normal   Subjective: No nausea no vomiting no fever no chills.  No chest pain.  No shortness of breath.  Physical Exam: Clear to auscultation. S1-S2 present Bowel sound present Trace edema.  Data Reviewed: I have Reviewed nursing notes, Vitals, and Lab results. Since last encounter, pertinent lab results CBC and BMP   . I have ordered test including CBC and BMP  . I have discussed pt's care plan and test results with GI  .  And cardiology.  Disposition: Status is: Inpatient Remains inpatient appropriate because: Monitor for further workup of colonic mass. SCDs Start: 10/01/24 1850   Family Communication: No one at bedside Level of care: Telemetry   Vitals:   10/03/24 1209 10/03/24 1315 10/03/24 1330 10/03/24 1340  BP: (!) 183/86 118/63 131/64 (!) 145/95  Pulse: 77 (!) 59 65 66  Resp: 18 15 (!) 23 (!) 24  Temp: 97.7 F (36.5 C) (!) 97.2 F (36.2 C)    TempSrc: Temporal Temporal    SpO2: 100% 100% 98% 91%  Weight:      Height:         Author: Yetta Blanch, MD 10/03/2024 2:10 PM  Please look on [email or web address] to find out who is on call.

## 2024-10-03 NOTE — Consult Note (Addendum)
 Cardiology Consultation  Patient ID: Kelli Webster MRN: [id number]; DOB: [birth year]  Admit date: 10/01/2024 Date of Consult: 10/03/2024  PCP:  [facility] Providers Cardiologist:  None  Advanced Heart Failure:  Ezra Shuck, MD    Patient Profile: Kelli Webster is a 78 y.o. female with a hx of paroxysmal A-Fib, OSA unable to tolerate CPAP, chronic HFrEF, hyperparathyroidism who is being seen 10/03/2024 for the evaluation of preoperative cardiac evaluation at the request of Dr. Tobie.  History of Present Illness: [name] has past medical history as stated above. She presented to [name] ED on 10/01/2024 as per recommendation of PCP due to abnormal labs. Her pre-op labs were being drawn and showed a hemoglobin on 7.1. she reports some fatigue, shortness of breath, lightheadedness over the past 2 weeks but thought it was just worsening in her baseline. FOBT was positive, she was given 2 units pRBC, her most recent hemoglobin is at 10.  She was seen by GI for ABLA with plans for EGD/colonoscopy. During her admission, an echocardiogram which was supposed to be scheduled for outpatient setting was completed and showed worsening LV function. LVEF was 30-35%, G3DD, normal RV systolic function, severe LAE, mild MR, normal IVC. When compared to echo 10/2023 that showed LVEF 40-45%, G1DD, otherwise the same.  Cardiology was asked to consult in the setting of worsening LV function prior to patient undergoing endoscopic procedures by GI.   She was just recently seen by Dr. Shuck as an outpatient 09/19/2024 for HFrEF. At that appointment she was noted to be in A-Fib, asymptomatic with this. She was first diagnosed with A-Fib in October 2014, underwent DCCV with recurrence of A-Fib, then underwent ablation 11/2013, underwent DCCV x 2 in 11/2020 and 12/2020 as well as a re-do ablation in 02/2021. In regards to her cardiomyopathy; her echo in 09/2013  showed LVEF 20-25%, LHC showed no CAD, TSH normal, HIV negative, SPEP/UPEP negative. Suspicion was for tachy medicated CM. Echo from 10/2023 showed LVEF 40-45%, mild MR. Echo was repeated this admission with results as above.   Her treatment plan for CHF included:  spironolactone  25 mg daily, carvedilol  3.125 mg BID, Farxiga  10 mg daily, losartan  25 mg daily (did not tolerate ARNI). Treatment for A-Fib included: amiodarone  200 mg BID x 1 week (starting 09/19/24) then 200 mg daily, Eliquis  5 mg BID, with plans to repeat possible DCCV if she remained in A-Fib or refer to EP for possible repeat ablation.   After speaking with the patient, she tells me that she is feeling really good.  She has been ambulating around her room, typically with a walker, with no issues.  She denies any prior or current chest pain.  She tells me that her shortness of breath is actually improved recently.  She is able to sit up in the bed and talk with me, no no increased respiratory effort.  She is on room air.  She appears euvolemic on exam.  She tells me her functional status has been mainly limited due to her back injury, extreme fatigue that she believes is related to her anemia as well as her hyperparathyroidism.  She tells me prior to this she was able to workout at the gym rather frequently.  She is excited to have these procedures done so she is able to be back at her baseline and back in the gym.  Past Medical History:  Diagnosis Date   Chronic kidney disease  CKD3a   Chronic systolic heart failure (HCC)    a. ECHO (09/2013) EF 20-25% diff HK, mild/mod MR, LA mod dil, RV mildly dil, RA mildly dil b. R/LHC (09/26/13) AO 133/88, LV 137/17, Lmain no dz, LAD 30% prox, LCX mild luminal irreg; RCA mild luminal irreg   Dysrhythmia    A.fib   had cardioversions and ablations   GERD (gastroesophageal reflux disease)    a. 09/2013 endoscopy nl   Hypertension    Morbid obesity (HCC)    Osteoarthritis    Persistent atrial  fibrillation (HCC) Chads2vascscore of at least 4(02/12/15)   a. Dx 09/2013 b. s/p PVI by Dr Kelsie 11-26-2013   Sleep apnea    No CPAP d/t claustrophobia   Past Surgical History:  Procedure Laterality Date   ABLATION  11/26/2013   PVI by Dr Kelsie   arthroscopy       left knee     ATRIAL FIBRILLATION ABLATION N/A 11/26/2013   Procedure: ATRIAL FIBRILLATION ABLATION;  Surgeon: Lynwood JONETTA Kelsie, MD;  Location: [facility] CATH LAB;  Service: Cardiovascular;  Laterality: N/A;   ATRIAL FIBRILLATION ABLATION N/A 02/18/2021   Procedure: ATRIAL FIBRILLATION ABLATION;  Surgeon: Kelsie Lynwood, MD;  Location: [facility] INVASIVE CV LAB;  Service: Cardiovascular;  Laterality: N/A;   CARDIOVERSION N/A 10/01/2013   Procedure: CARDIOVERSION;  Surgeon: Ezra GORMAN Shuck, MD;  Location: [facility] ENDOSCOPY;  Service: Cardiovascular;  Laterality: N/A;   CARDIOVERSION N/A 10/17/2013   Procedure: CARDIOVERSION;  Surgeon: Ezra GORMAN Shuck, MD;  Location: [facility] ENDOSCOPY;  Service: Cardiovascular;  Laterality: N/A;   CARDIOVERSION N/A 02/20/2019   Procedure: CARDIOVERSION;  Surgeon: Shuck Ezra GORMAN, MD;  Location: [facility] ENDOSCOPY;  Service: Cardiovascular;  Laterality: N/A;   CARDIOVERSION N/A 11/19/2020   Procedure: CARDIOVERSION;  Surgeon: Shuck Ezra GORMAN, MD;  Location: [facility] ENDOSCOPY;  Service: Cardiovascular;  Laterality: N/A;   CARDIOVERSION N/A 12/25/2020   Procedure: CARDIOVERSION;  Surgeon: Shuck Ezra GORMAN, MD;  Location: [facility] ENDOSCOPY;  Service: Cardiovascular;  Laterality: N/A;   CARDIOVERSION N/A 11/17/2021   Procedure: CARDIOVERSION;  Surgeon: Shuck Ezra GORMAN, MD;  Location: [facility] ENDOSCOPY;  Service: Cardiovascular;  Laterality: N/A;   ESOPHAGOGASTRODUODENOSCOPY N/A 09/25/2013   Procedure: ESOPHAGOGASTRODUODENOSCOPY (EGD);  Surgeon: Lesta JULIANNA Fitz, MD;  Location: [facility] ENDOSCOPY;  Service: Endoscopy;  Laterality: N/A;   fx left wrist     open reduction & internal fixaction   LEFT HEART CATHETERIZATION WITH CORONARY ANGIOGRAM   09/26/2013   Procedure: LEFT HEART CATHETERIZATION WITH CORONARY ANGIOGRAM;  Surgeon: Ezra GORMAN Shuck, MD;  Location: [facility] CATH LAB;  Service: Cardiovascular;;   TEE WITHOUT CARDIOVERSION N/A 10/01/2013   Procedure: TRANSESOPHAGEAL ECHOCARDIOGRAM (TEE);  Surgeon: Ezra GORMAN Shuck, MD;  Location: [facility] ENDOSCOPY;  Service: Cardiovascular;  Laterality: N/A;   TEE WITHOUT CARDIOVERSION N/A 11/26/2013   Procedure: TRANSESOPHAGEAL ECHOCARDIOGRAM (TEE);  Surgeon: Ezra GORMAN Shuck, MD;  Location: [facility] ENDOSCOPY;  Service: Cardiovascular;  Laterality: N/A;   TONSILLECTOMY     age 79    Home Medications:  Prior to Admission medications   Medication Sig Start Date End Date Taking? Authorizing Provider  amiodarone  (PACERONE ) 200 MG tablet Take 200 mg Twice daily for 1 week then 200 mg Daily Patient taking differently: Take 200 mg by mouth in the morning. 09/19/24  Yes Shuck Ezra GORMAN, MD  carvedilol  (COREG ) 3.125 MG tablet TAKE 1 TABLET BY MOUTH 2 TIMES DAILY. 09/16/24  Yes Shuck Ezra GORMAN, MD  ELIQUIS  5 MG TABS tablet TAKE 1 TABLET BY MOUTH TWICE  DAILY 04/02/24  Yes Rolan Ezra RAMAN, MD  famotidine  (PEPCID ) 20 MG tablet Take 20 mg by mouth daily as needed for heartburn or indigestion.   Yes [provider]  FARXIGA  10 MG TABS tablet Take 1 tablet (10 mg total) by mouth daily before breakfast. 09/27/23  Yes Rolan Ezra RAMAN, MD  losartan  (COZAAR ) 25 MG tablet Take 1 tablet (25 mg total) by mouth daily. 04/09/24  Yes [place], [place], FNP  MAGNESIUM PO Take 1 tablet by mouth at bedtime.   Yes [provider]  Multiple Vitamins-Minerals (CENTRUM WOMEN) TABS Take 1 tablet by mouth daily with breakfast.   Yes [provider]  spironolactone  (ALDACTONE ) 25 MG tablet Take 1 tablet (25 mg total) by mouth daily. Patient taking differently: Take 25 mg by mouth at bedtime. 09/19/24 12/18/24 Yes Rolan Ezra RAMAN, MD  TYLENOL  500 MG tablet Take 500-1,000 mg by mouth every 6 (six) hours as needed  (for pain or headaches).   Yes [provider]   Scheduled Meds:  amiodarone   200 mg Oral q AM   pantoprazole  (PROTONIX ) IV  40 mg Intravenous Q12H   Continuous Infusions:  sodium chloride      PRN Meds: mouth rinse  Allergies:   No Known Allergies  Social History:   Social History   Socioeconomic History   Marital status: Married    Spouse name: Not on file   Number of children: Not on file   Years of education: Not on file   Highest education level: Not on file  Occupational History   Not on file  Tobacco Use   Smoking status: Never   Smokeless tobacco: Never  Vaping Use   Vaping status: Never Used  Substance and Sexual Activity   Alcohol use: No   Drug use: No   Sexual activity: Yes    Birth control/protection: Post-menopausal  Other Topics Concern   Not on file  Social History Narrative   Lives in [place] with husband.  Does not routinely exercise.   Realtor with [employer]   Social Drivers of Health   Financial Resource Strain: Not on file  Food Insecurity: No Food Insecurity (10/02/2024)   Hunger Vital Sign    Worried About Running Out of Food in the Last Year: Never true    Ran Out of Food in the Last Year: Never true  Transportation Needs: No Transportation Needs (10/02/2024)   PRAPARE - Administrator, Civil Service (Medical): No    Lack of Transportation (Non-Medical): No  Physical Activity: Not on file  Stress: Not on file  Social Connections: Socially Integrated (10/02/2024)   Social Connection and Isolation Panel    Frequency of Communication with Friends and Family: More than three times a week    Frequency of Social Gatherings with Friends and Family: More than three times a week    Attends Religious Services: More than 4 times per year    Active Member of [employer] or Organizations: Yes    Attends Engineer, structural: More than 4 times per year    Marital Status: Married  Catering manager Violence: Not At Risk  (10/02/2024)   Humiliation, Afraid, Rape, and Kick questionnaire    Fear of Current or Ex-Partner: No    Emotionally Abused: No    Physically Abused: No    Sexually Abused: No    Family History:   Family History  Problem Relation Age of Onset   Heart attack Father  multiple MI's   Healthy Mother    Healthy Brother    Healthy Sister    Healthy Sister     ROS:  Please see the history of present illness.  All other ROS reviewed and negative.     Physical Exam/Data: Vitals:   10/02/24 0923 10/02/24 1623 10/02/24 2050 10/03/24 0500  BP: (!) 144/63 (!) 145/54 129/60   Pulse: 68 64 65   Resp: 16 18 16    Temp: 98.6 F (37 C) 98.6 F (37 C) (!) 96.5 F (35.8 C)   TempSrc:      SpO2: 98% 96% 98%   Weight:    95.2 kg  Height:        Intake/Output Summary (Last 24 hours) at 10/03/2024 0942 Last data filed at 10/02/2024 1300 Gross per 24 hour  Intake 240 ml  Output --  Net 240 ml      10/03/2024    5:00 AM 10/02/2024    5:00 AM 10/01/2024    8:46 PM  Last 3 Weights  Weight (lbs) 209 lb 14.1 oz 221 lb 1.9 oz 221 lb 12.5 oz  Weight (kg) 95.2 kg 100.3 kg 100.6 kg     Body mass index is 37.18 kg/m.   General:  Well nourished, well developed, in no acute distress HEENT: normal Neck: no JVD Vascular: No carotid bruits; Distal pulses 2+ bilaterally Cardiac:  normal S1, S2; iRR Lungs:  clear to auscultation bilaterally, no wheezing, rhonchi or rales  Abd: soft, nontender, no hepatomegaly  Ext: no edema Musculoskeletal:  No deformities Skin: warm and dry  Neuro:  no focal abnormalities noted Psych:  Normal affect   EKG:  The EKG was personally reviewed and demonstrates: Rate controlled A-fib, nonspecific IVCD  Telemetry:  Telemetry was personally reviewed and demonstrates: Rate controlled A-fib, HR 60s 70s  Relevant CV Studies:  Echocardiogram, 10/02/2024 Left ventricular ejection fraction, by estimation, is 30 to 35% . The left ventricle has moderately  decreased function. The left ventricle demonstrates global hypokinesis. There is mild left ventricular hypertrophy. Left ventricular diastolic parameters are consistent with Grade III diastolic dysfunction ( restrictive)  Right ventricular systolic function is normal. The right ventricular size is normal.  Left atrial size was severely dilated.  The mitral valve is normal in structure. Mild mitral valve regurgitation. No evidence of mitral stenosis.  The aortic valve is tricuspid. Aortic valve regurgitation is trivial. No aortic stenosis is present.  The inferior vena cava is normal in size with greater than 50% respiratory variability, suggesting right atrial pressure of 3 mmHg.  Cardiac cath, 09/26/2013 Coronary dominance: right Left mainstem: No significant disease. Left anterior descending (LAD): 30% proximal LAD stenosis. Left circumflex (LCx): Mild luminal irregularities. Right coronary artery (RCA): Mild luminal irregularities. Left ventriculography: Not done, recent echo.   Laboratory Data: High Sensitivity Troponin:  No results for input(s): TROPONINIHS in the last 720 hours.   Chemistry Recent Labs  Lab 10/01/24 1337 10/02/24 0349 10/03/24 0545  NA 139 140 140  K 4.5 4.1 4.3  CL 105 107 105  CO2 23 23 23   GLUCOSE 140* 85 86  BUN 26* 22 20  CREATININE 1.71* 1.45* 1.53*  CALCIUM 11.8* 11.1* 12.1*  MG  --   --  2.0  GFRNONAA 30* 37* 35*  ANIONGAP 11 10 12     Recent Labs  Lab 09/30/24 1432 10/02/24 0349 10/03/24 0545  PROT 5.9*  --  6.1*  ALBUMIN 3.8 3.5 3.9  AST 21  --  27  ALT 14  --  14  ALKPHOS 75  --  76  BILITOT 0.5  --  1.3*   Lipids No results for input(s): CHOL, TRIG, HDL, LABVLDL, LDLCALC, CHOLHDL in the last 168 hours.  Hematology Recent Labs  Lab 10/02/24 0349 10/02/24 1731 10/03/24 0545  WBC 6.6 8.1 8.0  RBC 4.10  4.09 4.34 4.69  HGB 9.0* 9.8* 10.0*  HCT 31.0* 33.2* 35.9*  MCV 75.6* 76.5* 76.5*  MCH 22.0* 22.6* 21.3*  MCHC  29.0* 29.5* 27.9*  RDW 19.0* 19.6* 20.2*  PLT 448* 445* 506*   Thyroid  No results for input(s): TSH, FREET4 in the last 168 hours.  BNPNo results for input(s): BNP, PROBNP in the last 168 hours.  DDimer No results for input(s): DDIMER in the last 168 hours.  Radiology/Studies:  ECHOCARDIOGRAM COMPLETE Result Date: 10/02/2024    ECHOCARDIOGRAM REPORT   Patient Name:   ZIYAN HILLMER Date of Exam: 10/02/2024 Medical Rec #:  [id number]   Height:       63.0 in Accession #:    [id number]  Weight:       221.1 lb Date of Birth:  [birth year]   BSA:          2.018 m Patient Age:    77 years    BP:           144/63 mmHg Patient Gender: F           HR:           76 bpm. Exam Location:  Inpatient Procedure: 2D Echo (Both Spectral and Color Flow Doppler were utilized during            procedure). Indications:    CHF  History:        Patient has prior history of Echocardiogram examinations. CHF.  Sonographer:    Norleen Amour Referring Phys: [phone] DALTON S MCLEAN IMPRESSIONS  1. Left ventricular ejection fraction, by estimation, is 30 to 35%. The left ventricle has moderately decreased function. The left ventricle demonstrates global hypokinesis. There is mild left ventricular hypertrophy. Left ventricular diastolic parameters are consistent with Grade III diastolic dysfunction (restrictive).  2. Right ventricular systolic function is normal. The right ventricular size is normal.  3. Left atrial size was severely dilated.  4. The mitral valve is normal in structure. Mild mitral valve regurgitation. No evidence of mitral stenosis.  5. The aortic valve is tricuspid. Aortic valve regurgitation is trivial. No aortic stenosis is present.  6. The inferior vena cava is normal in size with greater than 50% respiratory variability, suggesting right atrial pressure of 3 mmHg. FINDINGS  Left Ventricle: Left ventricular ejection fraction, by estimation, is 30 to 35%. The left ventricle has moderately decreased function. The  left ventricle demonstrates global hypokinesis. The left ventricular internal cavity size was normal in size. There is mild left ventricular hypertrophy. Left ventricular diastolic parameters are consistent with Grade III diastolic dysfunction (restrictive). Right Ventricle: The right ventricular size is normal. Right ventricular systolic function is normal. Left Atrium: Left atrial size was severely dilated. Right Atrium: Right atrial size was normal in size. Pericardium: There is no evidence of pericardial effusion. Mitral Valve: The mitral valve is normal in structure. Mild mitral valve regurgitation. No evidence of mitral valve stenosis. MV peak gradient, 8.4 mmHg. The mean mitral valve gradient is 2.0 mmHg. Tricuspid Valve: The tricuspid valve is normal in structure. Tricuspid valve regurgitation is trivial. No evidence of tricuspid stenosis. Aortic Valve: The aortic  valve is tricuspid. Aortic valve regurgitation is trivial. Aortic regurgitation PHT measures 694 msec. No aortic stenosis is present. Aortic valve mean gradient measures 7.5 mmHg. Aortic valve peak gradient measures 16.4 mmHg. Aortic valve area, by VTI measures 1.29 cm. Pulmonic Valve: The pulmonic valve was normal in structure. Pulmonic valve regurgitation is trivial. No evidence of pulmonic stenosis. Aorta: The aortic root is normal in size and structure. Venous: The inferior vena cava is normal in size with greater than 50% respiratory variability, suggesting right atrial pressure of 3 mmHg. IAS/Shunts: No atrial level shunt detected by color flow Doppler.  LEFT VENTRICLE PLAX 2D LVIDd:         6.00 cm      Diastology LVIDs:         4.10 cm      LV e' medial:    7.18 cm/s LV PW:         0.90 cm      LV E/e' medial:  20.6 LV IVS:        0.80 cm      LV e' lateral:   9.57 cm/s LVOT diam:     2.15 cm      LV E/e' lateral: 15.5 LV SV:         47 LV SV Index:   23 LVOT Area:     3.63 cm  LV Volumes (MOD) LV vol d, MOD A2C: 102.0 ml LV vol d, MOD  A4C: 117.0 ml LV vol s, MOD A2C: 60.5 ml LV vol s, MOD A4C: 54.0 ml LV SV MOD A2C:     41.5 ml LV SV MOD A4C:     117.0 ml LV SV MOD BP:      51.3 ml RIGHT VENTRICLE             IVC RV Basal diam:  3.10 cm     IVC diam: 1.80 cm RV S prime:     11.20 cm/s TAPSE (M-mode): 1.7 cm LEFT ATRIUM              Index        RIGHT ATRIUM           Index LA diam:        4.80 cm  2.38 cm/m   RA Area:     18.60 cm LA Vol (A2C):   115.0 ml 56.99 ml/m  RA Volume:   50.30 ml  24.93 ml/m LA Vol (A4C):   97.8 ml  48.47 ml/m LA Biplane Vol: 108.0 ml 53.52 ml/m  AORTIC VALVE                     PULMONIC VALVE AV Area (Vmax):    1.29 cm      PV Vmax:          1.14 m/s AV Area (Vmean):   1.46 cm      PV Peak grad:     5.2 mmHg AV Area (VTI):     1.29 cm      PR End Diast Vel: 7.62 msec AV Vmax:           202.25 cm/s AV Vmean:          128.250 cm/s AV VTI:            0.363 m AV Peak Grad:      16.4 mmHg AV Mean Grad:      7.5 mmHg LVOT Vmax:  72.03 cm/s LVOT Vmean:        51.733 cm/s LVOT VTI:          0.129 m LVOT/AV VTI ratio: 0.36 AI PHT:            694 msec  AORTA Ao Root diam: 2.40 cm Ao Asc diam:  3.20 cm MITRAL VALVE MV Area (PHT): 3.14 cm       SHUNTS MV Area VTI:   1.56 cm       Systemic VTI:  0.13 m MV Peak grad:  8.4 mmHg       Systemic Diam: 2.15 cm MV Mean grad:  2.0 mmHg MV Vmax:       1.45 m/s MV Vmean:      65.5 cm/s MR Peak grad:    113.2 mmHg MR Mean grad:    68.0 mmHg MR Vmax:         532.00 cm/s MR Vmean:        394.0 cm/s MR PISA:         1.01 cm MR PISA Eff ROA: 6 mm MR PISA Radius:  0.40 cm MV E velocity: 148.00 cm/s MV A velocity: 45.90 cm/s MV E/A ratio:  3.22 Redell Shallow MD Electronically signed by Redell Shallow MD Signature Date/Time: 10/02/2024/3:25:28 PM    Final    DG Chest 2 View Result Date: 10/01/2024 EXAM: 2 VIEW(S) XRAY OF THE CHEST 10/01/2024 03:02:00 PM COMPARISON: 09/18/2023 CLINICAL HISTORY: shob. Pt reports that she has low hgb per her provider, pt is having shob.  FINDINGS: LUNGS AND PLEURA: No focal pulmonary opacity. No pulmonary edema. No pleural effusion. No pneumothorax. HEART AND MEDIASTINUM: Stable cardiomegaly. BONES AND SOFT TISSUES: No acute osseous abnormality. IMPRESSION: 1. Stable cardiomegaly. Electronically signed by: Lynwood Seip MD 10/01/2024 03:14 PM EDT RP Workstation: [id number]   Assessment and Plan:  Preoperative cardiac evaluation  Asked to weigh in on cardiac risk with reduced EF on most recent echo  Echo showed LVEF 30-35%, previously noted to be 40-45% No active cardiac symptoms (chest pain, dyspnea, syncope, palpitations) No history of recent ACS, significant arrhythmias or severe valvular disease Stable functional status: She tells me she is mainly limited due to extreme fatigue that she believes is related to her anemia, hyperparathyroidism as well as a chronic back injury RCRI is 1, indicating perioperative risk of major cardiac event is 0.9% Discussed with MD- no additional testing If new symptoms develop, reassess as needed  Chronic HFrEF Nonischemic CM Home meds: spironolactone  25 mg daily, carvedilol  3.125 mg BID, Farxiga  10 mg daily, losartan  25 mg daily  Appears euvolemic on exam Noted to be 210.9 lb when seen in office 09/2024 -- same as weight today BNP stable per last check  Home meds held today  Restart home meds when able   Paroxysmal A-Fib/Flutter Home meds: carvedilol  3.125 mg BID, amiodarone  200 mg daily, Eliquis  5 mg BID Undergone multiple DCCV, ablation x 1 Recently seen by Dr. Rolan with plans for repeat DCCV and possible repeat ablation in future  Typically asymptomatic with A-Fib Currently in A-Fib with HR 60-70s Currently on amiodarone  200 mg daily Home carvedilol  held in the setting of bradycardia  Home Eliquis  held prior to undergoing GI procedures  Resume home meds when patient can tolerate and okay per GI  Per primary ABLA Suspected GI bleed OSA Hyperparathyroidism  Risk  Assessment/Risk Scores:      [place]  Heart Association (NYHA) Functional Class NYHA Class II-III  CHA2DS2-VASc Score = 4  This indicates a 4.8% annual risk of stroke. The patient's score is based upon: CHF History: 1 HTN History: 0 Diabetes History: 0 Stroke History: 0 Vascular Disease History: 0 Age Score: 2 Gender Score: 1        For questions or updates, please contact [facility] Please consult [email or web address] for contact info under      Signed, Waddell DELENA Donath, PA-C  10/03/2024 9:42 AM  I have personally evaluated and examined the patient. The history, physical exam, and medical decision making documented below were performed independently and substantively by me. I have reviewed all relevant data, formulated the assessment and plan, and assumed responsibility for the management of this patient. My documentation reflects the substantive portion of the split/shared visit, in accordance with CMS and CPT guidelines. I have personally performed the substantive portion of the medical decision making, including interpretation of diagnostic data, formulation of the management plan, and assessment of risks. In summation:  Ms. Wheless is a 78 yo F with paroxysmal atrial fibrillation and heart failure with reduced ejection fraction who presents for perioperative support prior to a GI procedure. She was referred by Dr. Tobie for perioperative support prior to a GI procedure.  She has a history of paroxysmal atrial fibrillation and heart failure with reduced ejection fraction. Her ejection fraction was noted to be 30-35% on a recent echocardiogram. She has mild mitral calcifications and evidence of dyssynchrony. An EKG showed atrial fibrillation with a QRS duration of 172 ms. She has a history of prior ablation and first-degree heart block.  She reports abnormal labs with her primary care doctor, with a hemoglobin level of 7, associated with fatigue, shortness of breath, and  lightheadedness. Her baseline hemoglobin is closer to 10. After intervention, her hemoglobin improved to 10. She is being evaluated by GI for consideration of EGD colonoscopy later today.  She has untreated obstructive sleep apnea and hyperparathyroidism, which she believes has contributed to her sedentary lifestyle and new arrhythmias. She was recently diagnosed with osteoporosis. She wants to have her surgery done to return to physical activity, which she feels has kept her well.  No chest pain, shortness of breath, palpitations, or lower extremity swelling. She notes that she is normally in rhythm and attributes her lack of rhythm to her sedentary nature and hyperparathyroidism.  Exam notable for  Gen: no distress, morbid obesity   Neck: No JVD Cardiac: No Rubs or Gallops, no Murmur, IRIR  +2 radial pulses Respiratory: Clear to auscultation bilaterally, normal effort, normal  respiratory rate GI: Soft, nontender, non-distended  MS: No  edema;  moves all extremities Integument: Skin feels warm Neuro:  At time of evaluation, alert and oriented to person/place/time/situation  Psych: Normal affect, patient feels ok  Personally reviewed data and interpretation:   Labs notable for   Hemoglobin: 10 Glucose: 86 Total bilirubin: 1.3 Creatinine: 1.53  DIAGNOSTIC Echocardiogram: Ejection fraction 30-35%, mild mitral calcifications, evidence of dyssynchrony EKG: Atrial fibrillation, QRS duration 172 ms (10/02/2024) Tele: AF with PVCs   In assessment and plan:   Heart failure with reduced ejection fraction - Ejection fraction decreased to 30-35%, likely due to persistent atrial fibrillation vs QRS widening Echocardiogram shows dyssynchrony and mild mitral calcifications. QRS duration is 172 ms, indicating QR restoration greater than 150 ms. Unable to cardiovert due to inability to tolerate blood thinners. - NYHA I, Euvolemic, Stage C - Continue GDMT with beta blocker, SGLT2 inhibitor, MRA,  and ARB - Consider CRT evaluation by primary cardiologist as  outpatient  Persistent atrial fibrillation Currently in atrial fibrillation with a history of prior ablation and first-degree heart block. Unable to cardiovert due to inability to tolerate blood thinners. New arrhythmias may be related to sedentary nature post-diagnosis of hyperparathyroidism. - Continue amiodarone  - Stop Eliquis  until cleared by GI  Anemia likely secondary to gastrointestinal blood loss Hemoglobin decreased to 7, associated with fatigue, shortness of breath, and lightheadedness. Baseline hemoglobin closer to 10. Anemia likely secondary to gastrointestinal blood loss. Awaiting GI evaluation for EGD and colonoscopy. - reasonable to proceed with GI evaluation  IF patient is here 10/04/24 we will check in on her; other she has f/u planned with AHF 11/07/24, my hope is here 10/29/24 surgery is not delayed as it may improved her LVEF (pre-op eval is 10/07/24)  Stanly Leavens, MD FASE [facility] Cardiologist [facility]  [address] [place], [address] [place], [place] [address] [phone]  10:52 AM

## 2024-10-04 ENCOUNTER — Emergency Department (HOSPITAL_COMMUNITY)

## 2024-10-04 DIAGNOSIS — I509 Heart failure, unspecified: Secondary | ICD-10-CM

## 2024-10-04 DIAGNOSIS — D649 Anemia, unspecified: Secondary | ICD-10-CM | POA: Diagnosis not present

## 2024-10-04 DIAGNOSIS — I4819 Other persistent atrial fibrillation: Secondary | ICD-10-CM

## 2024-10-04 LAB — CBC
HCT: 34.3 % — ABNORMAL LOW (ref 36.0–46.0)
Hemoglobin: 9.9 g/dL — ABNORMAL LOW (ref 12.0–15.0)
MCH: 22.6 pg — ABNORMAL LOW (ref 26.0–34.0)
MCHC: 28.9 g/dL — ABNORMAL LOW (ref 30.0–36.0)
MCV: 78.1 fL — ABNORMAL LOW (ref 80.0–100.0)
Platelets: 452 K/uL — ABNORMAL HIGH (ref 150–400)
RBC: 4.39 MIL/uL (ref 3.87–5.11)
RDW: 21.2 % — ABNORMAL HIGH (ref 11.5–15.5)
WBC: 7.5 K/uL (ref 4.0–10.5)
nRBC: 0 % (ref 0.0–0.2)

## 2024-10-04 LAB — BASIC METABOLIC PANEL WITH GFR
Anion gap: 9 (ref 5–15)
BUN: 16 mg/dL (ref 8–23)
CO2: 23 mmol/L (ref 22–32)
Calcium: 12 mg/dL — ABNORMAL HIGH (ref 8.9–10.3)
Chloride: 105 mmol/L (ref 98–111)
Creatinine, Ser: 1.52 mg/dL — ABNORMAL HIGH (ref 0.44–1.00)
GFR, Estimated: 35 mL/min — ABNORMAL LOW (ref >60)
Glucose, Bld: 110 mg/dL — ABNORMAL HIGH (ref 70–99)
Potassium: 4.1 mmol/L (ref 3.5–5.1)
Sodium: 137 mmol/L (ref 135–145)

## 2024-10-04 LAB — CEA: CEA: 2.8 ng/mL (ref 0.0–4.7)

## 2024-10-04 LAB — MAGNESIUM: Magnesium: 1.9 mg/dL (ref 1.7–2.4)

## 2024-10-04 MED ORDER — SODIUM CHLORIDE 0.9 % IV SOLN: INTRAVENOUS | Status: DC

## 2024-10-04 NOTE — Progress Notes (Signed)
 Per tele, pt was bradycardic as low as 42 bpm non sustained, There was also a 2.01 sec pause that was noted . Pt is resting comfortably, monitoring continues. SABRA SABRA

## 2024-10-04 NOTE — Progress Notes (Signed)
 Per tele, pt had a 2.3 sec pause on a strip . However, all vital signs are stable and pt is resting comfortably . Monitoring continues . SABRA SABRA

## 2024-10-04 NOTE — Progress Notes (Addendum)
 [facility] Progress Note  Kelli Webster 78 y.o. 1946-03-14  CC: Anemia, colon mass   Subjective: Patient seen and examined at bedside.  Denies any acute GI issues.  1 episode of small amount of bleeding yesterday which has resolved now.  ROS : Afebrile, negative for chest pain   Objective: Vital signs in last 24 hours: Vitals:   10/04/24 0524 10/04/24 0600  BP: (!) 142/64   Pulse: 68   Resp: 18 18  Temp: 97.9 F (36.6 C)   SpO2: 100%     Physical Exam: Resting comfortably, not in acute distress.  Abdominal exam benign.  Mood and affect normal.  Alert and oriented x 3   Lab Results: Recent Labs    10/02/24 0349 10/03/24 0545  NA 140 140  K 4.1 4.3  CL 107 105  CO2 23 23  GLUCOSE 85 86  BUN 22 20  CREATININE 1.45* 1.53*  CALCIUM 11.1* 12.1*  MG  --  2.0   Recent Labs    10/02/24 0349 10/03/24 0545  AST  --  27  ALT  --  14  ALKPHOS  --  76  BILITOT  --  1.3*  PROT  --  6.1*  ALBUMIN 3.5 3.9   Recent Labs    10/02/24 1731 10/03/24 0545  WBC 8.1 8.0  NEUTROABS 5.3 5.5  HGB 9.8* 10.0*  HCT 33.2* 35.9*  MCV 76.5* 76.5*  PLT 445* 506*   No results for input(s): LABPROT, INR in the last 72 hours.    Assessment/Plan: -Acute blood loss anemia with heme positive stool.  Underwent EGD and colonoscopy yesterday.  - EGD showed moderate to severe gastritis.  No evidence of active bleeding.  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  Colonoscopy also showed multiple other polyps which was removed with a cold snare, normal TI, diverticulosis and hemorrhoids.  - History of A-fib.  Last dose of Eliquis  prior to arrival - History of CHF.  Recent echocardiogram showed EF of 30 to 35%. - Parathyroid  disorder.  Recommendations --------------------------- - CT chest abdomen pelvis with IV contrast showed 3.8 cm hepatic flexure mass.  CT also showed enlarged tracheoesophageal lymph node as well  as multiple pulmonary nodules largest measuring 8 mm.  -  Normal CEA  - Okay to resume anticoagulation tomorrow from GI standpoint if no further bleeding.  - Primary team has requested surgery consult. - She will need heme-onc consult once pathology results are back.  Heme-onc consult can be done as an outpatient.  -No further inpatient GI workup planned.  GI will sign off.  Call us  back if needed.  Follow-up in GI clinic in 3 months after discharge  Layla Lah MD, FACP 10/04/2024, 10:46 AM  Contact #  [phone]   Layla Lah MD, FACP 10/04/2024, 10:43 AM  Contact #  [phone]

## 2024-10-04 NOTE — Progress Notes (Signed)
 Progress Note  Patient Name: Kelli Webster Date of Encounter: 10/04/2024 Primary Cardiologist: None   Subjective   Overnight asymptomatic AF pause < 5 s.   Found to have large mass at the hepatic flexure- concerning for malignancy.  Vital Signs    Vitals:   10/04/24 0400 10/04/24 0500 10/04/24 0524 10/04/24 0600  BP:   (!) 142/64   Pulse:   68   Resp: 18 18 18 18   Temp:   97.9 F (36.6 C)   TempSrc:   Oral   SpO2:   100%   Weight:   100.7 kg   Height:        Intake/Output Summary (Last 24 hours) at 10/04/2024 0736 Last data filed at 10/03/2024 1306 Gross per 24 hour  Intake 100 ml  Output --  Net 100 ml   Filed Weights   10/02/24 0500 10/03/24 0500 10/04/24 0524  Weight: 100.3 kg 95.2 kg 100.7 kg    Physical Exam   GEN: No acute distress.   Neck: No JVD Cardiac: iRRR, no murmurs, rubs, or gallops.  Respiratory: Clear to auscultation bilaterally. GI: Soft, nontender, non-distended  MS: No edema  Labs   Telemetry: AF    Chemistry Recent Labs  Lab 09/30/24 1432 10/01/24 1337 10/02/24 0349 10/03/24 0545  NA 140 139 140 140  K 5.0 4.5 4.1 4.3  CL 107 105 107 105  CO2 24 23 23 23   GLUCOSE 119* 140* 85 86  BUN 22 26* 22 20  CREATININE 1.56* 1.71* 1.45* 1.53*  CALCIUM 11.7* 11.8* 11.1* 12.1*  PROT 5.9*  --   --  6.1*  ALBUMIN 3.8  --  3.5 3.9  AST 21  --   --  27  ALT 14  --   --  14  ALKPHOS 75  --   --  76  BILITOT 0.5  --   --  1.3*  GFRNONAA 34* 30* 37* 35*  ANIONGAP 10 11 10 12      Hematology Recent Labs  Lab 10/02/24 0349 10/02/24 1731 10/03/24 0545  WBC 6.6 8.1 8.0  RBC 4.10  4.09 4.34 4.69  HGB 9.0* 9.8* 10.0*  HCT 31.0* 33.2* 35.9*  MCV 75.6* 76.5* 76.5*  MCH 22.0* 22.6* 21.3*  MCHC 29.0* 29.5* 27.9*  RDW 19.0* 19.6* 20.2*  PLT 448* 445* 506*    Cardiac Studies   Cardiac Studies & Procedures   ______________________________________________________________________________________________      ECHOCARDIOGRAM  ECHOCARDIOGRAM COMPLETE 10/02/2024  Narrative ECHOCARDIOGRAM REPORT    Patient Name:   Kelli Webster Date of Exam: 10/02/2024 Medical Rec #:  [id number]   Height:       63.0 in Accession #:    [id number]  Weight:       221.1 lb Date of Birth:  10/29/1946   BSA:          2.018 m Patient Age:    77 years    BP:           144/63 mmHg Patient Gender: F           HR:           76 bpm. Exam Location:  Inpatient  Procedure: 2D Echo (Both Spectral and Color Flow Doppler were utilized during procedure).  Indications:    CHF  History:        Patient has prior history of Echocardiogram examinations. CHF.  Sonographer:    Norleen Amour Referring Phys: [id number] EZRA RAMAN MCLEAN  IMPRESSIONS  1. Left ventricular ejection fraction, by estimation, is 30 to 35%. The left ventricle has moderately decreased function. The left ventricle demonstrates global hypokinesis. There is mild left ventricular hypertrophy. Left ventricular diastolic parameters are consistent with Grade III diastolic dysfunction (restrictive). 2. Right ventricular systolic function is normal. The right ventricular size is normal. 3. Left atrial size was severely dilated. 4. The mitral valve is normal in structure. Mild mitral valve regurgitation. No evidence of mitral stenosis. 5. The aortic valve is tricuspid. Aortic valve regurgitation is trivial. No aortic stenosis is present. 6. The inferior vena cava is normal in size with greater than 50% respiratory variability, suggesting right atrial pressure of 3 mmHg.  FINDINGS Left Ventricle: Left ventricular ejection fraction, by estimation, is 30 to 35%. The left ventricle has moderately decreased function. The left ventricle demonstrates global hypokinesis. The left ventricular internal cavity size was normal in size. There is mild left ventricular hypertrophy. Left ventricular diastolic parameters are consistent with Grade III diastolic dysfunction  (restrictive).  Right Ventricle: The right ventricular size is normal. Right ventricular systolic function is normal.  Left Atrium: Left atrial size was severely dilated.  Right Atrium: Right atrial size was normal in size.  Pericardium: There is no evidence of pericardial effusion.  Mitral Valve: The mitral valve is normal in structure. Mild mitral valve regurgitation. No evidence of mitral valve stenosis. MV peak gradient, 8.4 mmHg. The mean mitral valve gradient is 2.0 mmHg.  Tricuspid Valve: The tricuspid valve is normal in structure. Tricuspid valve regurgitation is trivial. No evidence of tricuspid stenosis.  Aortic Valve: The aortic valve is tricuspid. Aortic valve regurgitation is trivial. Aortic regurgitation PHT measures 694 msec. No aortic stenosis is present. Aortic valve mean gradient measures 7.5 mmHg. Aortic valve peak gradient measures 16.4 mmHg. Aortic valve area, by VTI measures 1.29 cm.  Pulmonic Valve: The pulmonic valve was normal in structure. Pulmonic valve regurgitation is trivial. No evidence of pulmonic stenosis.  Aorta: The aortic root is normal in size and structure.  Venous: The inferior vena cava is normal in size with greater than 50% respiratory variability, suggesting right atrial pressure of 3 mmHg.  IAS/Shunts: No atrial level shunt detected by color flow Doppler.   LEFT VENTRICLE PLAX 2D LVIDd:         6.00 cm      Diastology LVIDs:         4.10 cm      LV e' medial:    7.18 cm/s LV PW:         0.90 cm      LV E/e' medial:  20.6 LV IVS:        0.80 cm      LV e' lateral:   9.57 cm/s LVOT diam:     2.15 cm      LV E/e' lateral: 15.5 LV SV:         47 LV SV Index:   23 LVOT Area:     3.63 cm  LV Volumes (MOD) LV vol d, MOD A2C: 102.0 ml LV vol d, MOD A4C: 117.0 ml LV vol s, MOD A2C: 60.5 ml LV vol s, MOD A4C: 54.0 ml LV SV MOD A2C:     41.5 ml LV SV MOD A4C:     117.0 ml LV SV MOD BP:      51.3 ml  RIGHT VENTRICLE             IVC RV  Basal diam:  3.10 cm  IVC diam: 1.80 cm RV S prime:     11.20 cm/s TAPSE (M-mode): 1.7 cm  LEFT ATRIUM              Index        RIGHT ATRIUM           Index LA diam:        4.80 cm  2.38 cm/m   RA Area:     18.60 cm LA Vol (A2C):   115.0 ml 56.99 ml/m  RA Volume:   50.30 ml  24.93 ml/m LA Vol (A4C):   97.8 ml  48.47 ml/m LA Biplane Vol: 108.0 ml 53.52 ml/m AORTIC VALVE                     PULMONIC VALVE AV Area (Vmax):    1.29 cm      PV Vmax:          1.14 m/s AV Area (Vmean):   1.46 cm      PV Peak grad:     5.2 mmHg AV Area (VTI):     1.29 cm      PR End Diast Vel: 7.62 msec AV Vmax:           202.25 cm/s AV Vmean:          128.250 cm/s AV VTI:            0.363 m AV Peak Grad:      16.4 mmHg AV Mean Grad:      7.5 mmHg LVOT Vmax:         72.03 cm/s LVOT Vmean:        51.733 cm/s LVOT VTI:          0.129 m LVOT/AV VTI ratio: 0.36 AI PHT:            694 msec  AORTA Ao Root diam: 2.40 cm Ao Asc diam:  3.20 cm  MITRAL VALVE MV Area (PHT): 3.14 cm       SHUNTS MV Area VTI:   1.56 cm       Systemic VTI:  0.13 m MV Peak grad:  8.4 mmHg       Systemic Diam: 2.15 cm MV Mean grad:  2.0 mmHg MV Vmax:       1.45 m/s MV Vmean:      65.5 cm/s MR Peak grad:    113.2 mmHg MR Mean grad:    68.0 mmHg MR Vmax:         532.00 cm/s MR Vmean:        394.0 cm/s MR PISA:         1.01 cm MR PISA Eff ROA: 6 mm MR PISA Radius:  0.40 cm MV E velocity: 148.00 cm/s MV A velocity: 45.90 cm/s MV E/A ratio:  3.22  Redell Shallow MD Electronically signed by Redell Shallow MD Signature Date/Time: 10/02/2024/3:25:28 PM    Final          ______________________________________________________________________________________________      Assessment & Plan    Heart failure with reduced ejection fraction - NYHA I, euvolemic, Stage C - Ejection fraction decreased to 30-35%, likely due to persistent atrial fibrillation vs QRS widening Echocardiogram shows dyssynchrony and  mild mitral calcifications. QRS duration is 172 ms, indicating QR restoration greater than 150 ms. Unable to cardiovert due to inability to tolerate blood thinners. - Continue GDMT with beta blocker, SGLT2 inhibitor, MRA, and ARB - Consider CRT evaluation by primary  cardiologist as outpatient after her multiple planned surgeries   Persistent atrial fibrillation - Continue amiodarone  - Stop Eliquis  until cleared by GI (after mass eval); she is aware of stroke risk - she has had AF pauses with no sx < 5s; no indication for PPM outside of HF reasons (as above) this can be discussed as outpatient    For questions or updates, please contact [facility] Please consult [email or web address] for contact info under Cardiology/STEMI.      Stanly Leavens, MD FASE [facility] Cardiologist [facility]  [address] [place], [address] [place], [place] [address] [phone]  7:36 AM

## 2024-10-04 NOTE — Progress Notes (Signed)
 [facility] Progress Note Patient: Kelli Webster FMW:[id number] DOB: Feb 19, 1946  DOA: 10/01/2024 DOS: the patient was seen and examined on 10/04/2024  Brief Hospital Course: Patient with PMH of Chronic combined CHF, Eliquis  for A-fib, CKD 3B, OSA, HTN, primary hyperparathyroidism with recently scheduled outpatient surgery, obesity Presented to the hospital with complaints of abnormal lab. Received 1 PRBC transfusion.  [facility] consulted.  Will monitor recommendation. Assessment and Plan: Symptomatic blood loss anemia. Status post EGD colonoscopy on 10/16. Colonic mass. Severe gastritis. Multiple colonic polyps. Likely chronic in nature. Patient never had a colonoscopy. Initially patient was planning to get procedure done outpatient although after further discussion patient was agreeable to EGD colonoscopy in the hospital. Hemoglobin was 7.1 upon admission, improved to 9, baseline appears to be around 11 for the patient. Iron level was low which was replaced. Continue with PPI EGD showed severe gastritis and colonoscopy shows hepatic mass. Appreciate GI consultation. CT chest abdomen pelvis shows hepatic flexure mass as well as some pulmonary nodules. CEA normal. Appreciate general surgery consultation. Most likely will require surgical colectomy this admission. Discussed with cardiology, okay to hold anticoagulation for now.   Paroxysmal A-fib. On anticoagulation with Eliquis . Currently appears to be rate controlled. Continue amiodarone  and Coreg . Hold anticoagulation for now per my discussion with cardiology.   Primary hyperparathyroidism. Mild hypercalcemia. Currently scheduled for outpatient procedure.  Surgery will be now postponed until patient recovers from colectomy. Will monitor.   Chronic combined systolic and diastolic CHF. Echocardiogram shows EF of 40 to 45% with grade 1 diastolic dysfunction. Does not appear to be severely volume overloaded. Repeat  echocardiogram shows worsening EF.  Appreciate cardio consultation.  No further workup right now. Will monitor.   Obesity Class 1 Body mass index is 37.18 kg/m.  Placing the pt at higher risk of poor outcomes.  Normal   Subjective: No nausea no vomiting.  No fever no chills.  Denies any other complaint.  Physical Exam: Clear to auscultation. S1-S2 present  bowel sound present.  None tender. No edema.  Data Reviewed: I have Reviewed nursing notes, Vitals, and Lab results. Since last encounter, pertinent lab results CBC and BMP   . I have ordered test including CBC and BMP  .  Discussed with GI and general surgery.  Disposition: Status is: Inpatient Remains inpatient appropriate because: Monitor for further workup  SCDs Start: 10/01/24 1850   Family Communication: No one at bedside Level of care: Telemetry   Vitals:   10/04/24 0500 10/04/24 0524 10/04/24 0600 10/04/24 1238  BP:  (!) 142/64  131/62  Pulse:  68  80  Resp: 18 18 18 19   Temp:  97.9 F (36.6 C)  97.7 F (36.5 C)  TempSrc:  Oral  Oral  SpO2:  100%  95%  Weight:  100.7 kg    Height:         Author: Yetta Blanch, MD 10/04/2024 6:11 PM  Please look on [email or web address] to find out who is on call.

## 2024-10-04 NOTE — Consult Note (Signed)
 Kelli Webster 1946/10/18  [id number].    Requesting MD: Tobie Chief Complaint/Reason for Consult: Bleeding hepatic flexure mass  HPI:  78 y/o F with a hx of Afib on Eliquis , HF (EF 30-35%), CKD, hyperparathyroidism, and OSA who presented to the hospital after she was found to be anemic on labs performed prior to a scheduled parathyroidectomy. Her Hb on 10/14 was 7.1 and she was transfused and has responded to a Hb of 10.  She underwent an EGD and colonoscopy, which were notable for a mass at the hepatic flexure concerning for malignancy. CT CAP was performed and showed the known colon mass, multiple lung nodules, and an enlarged paratracheal LN.  On exam, patient is resting in bed. She reports that she has been fatigued for approximately 1.5 months but denies other complaints. She never had a colonoscopy prior to this admission. She has never had an abdominal operation.   ROS: Review of Systems  Constitutional:  Positive for malaise/fatigue.  HENT: Negative.    Eyes: Negative.   Respiratory: Negative.    Cardiovascular: Negative.   Gastrointestinal: Negative.   Genitourinary: Negative.   Musculoskeletal: Negative.   Skin: Negative.   Neurological: Negative.   Endo/Heme/Allergies: Negative.   Psychiatric/Behavioral: Negative.      Family History  Problem Relation Age of Onset   Heart attack Father         multiple MI's   Healthy Mother    Healthy Brother    Healthy Sister    Healthy Sister     Past Medical History:  Diagnosis Date   Chronic kidney disease    CKD3a   Chronic systolic heart failure (HCC)    a. ECHO (09/2013) EF 20-25% diff HK, mild/mod MR, LA mod dil, RV mildly dil, RA mildly dil b. R/LHC (09/26/13) AO 133/88, LV 137/17, Lmain no dz, LAD 30% prox, LCX mild luminal irreg; RCA mild luminal irreg   Dysrhythmia    A.fib   had cardioversions and ablations   GERD (gastroesophageal reflux disease)    a. 09/2013 endoscopy nl   Hypertension    Morbid obesity  (HCC)    Osteoarthritis    Persistent atrial fibrillation (HCC) Chads2vascscore of at least 4(02/12/15)   a. Dx 09/2013 b. s/p PVI by Dr Kelsie 11-26-2013   Sleep apnea    No CPAP d/t claustrophobia    Past Surgical History:  Procedure Laterality Date   ABLATION  11/26/2013   PVI by Dr Kelsie   arthroscopy       left knee     ATRIAL FIBRILLATION ABLATION N/A 11/26/2013   Procedure: ATRIAL FIBRILLATION ABLATION;  Surgeon: Lynwood JONETTA Kelsie, MD;  Location: [facility] CATH LAB;  Service: Cardiovascular;  Laterality: N/A;   ATRIAL FIBRILLATION ABLATION N/A 02/18/2021   Procedure: ATRIAL FIBRILLATION ABLATION;  Surgeon: Kelsie Lynwood, MD;  Location: [facility] INVASIVE CV LAB;  Service: Cardiovascular;  Laterality: N/A;   CARDIOVERSION N/A 10/01/2013   Procedure: CARDIOVERSION;  Surgeon: Ezra GORMAN Shuck, MD;  Location: [facility] ENDOSCOPY;  Service: Cardiovascular;  Laterality: N/A;   CARDIOVERSION N/A 10/17/2013   Procedure: CARDIOVERSION;  Surgeon: Ezra GORMAN Shuck, MD;  Location: [facility] ENDOSCOPY;  Service: Cardiovascular;  Laterality: N/A;   CARDIOVERSION N/A 02/20/2019   Procedure: CARDIOVERSION;  Surgeon: Shuck Ezra GORMAN, MD;  Location: [facility] ENDOSCOPY;  Service: Cardiovascular;  Laterality: N/A;   CARDIOVERSION N/A 11/19/2020   Procedure: CARDIOVERSION;  Surgeon: Shuck Ezra GORMAN, MD;  Location: [facility] ENDOSCOPY;  Service: Cardiovascular;  Laterality: N/A;  CARDIOVERSION N/A 12/25/2020   Procedure: CARDIOVERSION;  Surgeon: Rolan Ezra RAMAN, MD;  Location: [facility] ENDOSCOPY;  Service: Cardiovascular;  Laterality: N/A;   CARDIOVERSION N/A 11/17/2021   Procedure: CARDIOVERSION;  Surgeon: Rolan Ezra RAMAN, MD;  Location: [facility] ENDOSCOPY;  Service: Cardiovascular;  Laterality: N/A;   ESOPHAGOGASTRODUODENOSCOPY N/A 09/25/2013   Procedure: ESOPHAGOGASTRODUODENOSCOPY (EGD);  Surgeon: Lesta JULIANNA Fitz, MD;  Location: [facility] ENDOSCOPY;  Service: Endoscopy;  Laterality: N/A;   fx left wrist     open reduction & internal fixaction   LEFT HEART  CATHETERIZATION WITH CORONARY ANGIOGRAM  09/26/2013   Procedure: LEFT HEART CATHETERIZATION WITH CORONARY ANGIOGRAM;  Surgeon: Ezra RAMAN Rolan, MD;  Location: [facility] CATH LAB;  Service: Cardiovascular;;   TEE WITHOUT CARDIOVERSION N/A 10/01/2013   Procedure: TRANSESOPHAGEAL ECHOCARDIOGRAM (TEE);  Surgeon: Ezra RAMAN Rolan, MD;  Location: [facility] ENDOSCOPY;  Service: Cardiovascular;  Laterality: N/A;   TEE WITHOUT CARDIOVERSION N/A 11/26/2013   Procedure: TRANSESOPHAGEAL ECHOCARDIOGRAM (TEE);  Surgeon: Ezra RAMAN Rolan, MD;  Location: [facility] ENDOSCOPY;  Service: Cardiovascular;  Laterality: N/A;   TONSILLECTOMY     age 26    Social History:  reports that she has never smoked. She has never used smokeless tobacco. She reports that she does not drink alcohol and does not use drugs.  Allergies: No Known Allergies  Medications Prior to Admission  Medication Sig Dispense Refill   amiodarone  (PACERONE ) 200 MG tablet Take 200 mg Twice daily for 1 week then 200 mg Daily (Patient taking differently: Take 200 mg by mouth in the morning.)     carvedilol  (COREG ) 3.125 MG tablet TAKE 1 TABLET BY MOUTH 2 TIMES DAILY. 180 tablet 3   ELIQUIS  5 MG TABS tablet TAKE 1 TABLET BY MOUTH TWICE  DAILY 180 tablet 3   famotidine  (PEPCID ) 20 MG tablet Take 20 mg by mouth daily as needed for heartburn or indigestion.     FARXIGA  10 MG TABS tablet Take 1 tablet (10 mg total) by mouth daily before breakfast. 30 tablet 11   losartan  (COZAAR ) 25 MG tablet Take 1 tablet (25 mg total) by mouth daily. 90 tablet 3   MAGNESIUM PO Take 1 tablet by mouth at bedtime.     Multiple Vitamins-Minerals (CENTRUM WOMEN) TABS Take 1 tablet by mouth daily with breakfast.     spironolactone  (ALDACTONE ) 25 MG tablet Take 1 tablet (25 mg total) by mouth daily. (Patient taking differently: Take 25 mg by mouth at bedtime.) 90 tablet 3   TYLENOL  500 MG tablet Take 500-1,000 mg by mouth every 6 (six) hours as needed (for pain or headaches).      Physical  Exam: Blood pressure 131/62, pulse 80, temperature 97.7 F (36.5 C), temperature source Oral, resp. rate 19, height 5' 3 (1.6 m), weight 100.7 kg, SpO2 95%. Gen: female, NAD Abd: soft, non-distended, non-tender  Results for orders placed or performed during the hospital encounter of 10/01/24 (from the past 48 hours)  CBC with Differential/Platelet     Status: Abnormal   Collection Time: 10/02/24  5:31 PM  Result Value Ref Range   WBC 8.1 4.0 - 10.5 K/uL   RBC 4.34 3.87 - 5.11 MIL/uL   Hemoglobin 9.8 (L) 12.0 - 15.0 g/dL   HCT 66.7 (L) 63.9 - 53.9 %   MCV 76.5 (L) 80.0 - 100.0 fL   MCH 22.6 (L) 26.0 - 34.0 pg   MCHC 29.5 (L) 30.0 - 36.0 g/dL   RDW 80.3 (H) 88.4 - 84.4 %   Platelets 445 (  H) 150 - 400 K/uL   nRBC 0.4 (H) 0.0 - 0.2 %   Neutrophils Relative % 64 %   Neutro Abs 5.3 1.7 - 7.7 K/uL   Lymphocytes Relative 22 %   Lymphs Abs 1.8 0.7 - 4.0 K/uL   Monocytes Relative 10 %   Monocytes Absolute 0.8 0.1 - 1.0 K/uL   Eosinophils Relative 2 %   Eosinophils Absolute 0.1 0.0 - 0.5 K/uL   Basophils Relative 1 %   Basophils Absolute 0.1 0.0 - 0.1 K/uL   Immature Granulocytes 1 %   Abs Immature Granulocytes 0.08 (H) 0.00 - 0.07 K/uL    Comment: Performed at [facility], [address] [place], [place] [address]  Magnesium     Status: None   Collection Time: 10/03/24  5:45 AM  Result Value Ref Range   Magnesium 2.0 1.7 - 2.4 mg/dL    Comment: Performed at [facility], [address], [place], [place] [address]  Comprehensive metabolic panel with GFR     Status: Abnormal   Collection Time: 10/03/24  5:45 AM  Result Value Ref Range   Sodium 140 135 - 145 mmol/L   Potassium 4.3 3.5 - 5.1 mmol/L   Chloride 105 98 - 111 mmol/L   CO2 23 22 - 32 mmol/L   Glucose, Bld 86 70 - 99 mg/dL    Comment: Glucose reference range applies only to samples taken after fasting for at least 8 hours.   BUN 20 8 - 23 mg/dL   Creatinine, Ser 8.46 (H) 0.44 -  1.00 mg/dL   Calcium 87.8 (H) 8.9 - 10.3 mg/dL   Total Protein 6.1 (L) 6.5 - 8.1 g/dL   Albumin 3.9 3.5 - 5.0 g/dL   AST 27 15 - 41 U/L   ALT 14 0 - 44 U/L   Alkaline Phosphatase 76 38 - 126 U/L   Total Bilirubin 1.3 (H) 0.0 - 1.2 mg/dL   GFR, Estimated 35 (L) >60 mL/min    Comment: (NOTE) Calculated using the CKD-EPI Creatinine Equation (2021)    Anion gap 12 5 - 15    Comment: Performed at [facility], [address] [place], [place] [address]  CBC with Differential/Platelet     Status: Abnormal   Collection Time: 10/03/24  5:45 AM  Result Value Ref Range   WBC 8.0 4.0 - 10.5 K/uL   RBC 4.69 3.87 - 5.11 MIL/uL   Hemoglobin 10.0 (L) 12.0 - 15.0 g/dL   HCT 64.0 (L) 63.9 - 53.9 %   MCV 76.5 (L) 80.0 - 100.0 fL   MCH 21.3 (L) 26.0 - 34.0 pg   MCHC 27.9 (L) 30.0 - 36.0 g/dL   RDW 79.7 (H) 88.4 - 84.4 %   Platelets 506 (H) 150 - 400 K/uL   nRBC 0.3 (H) 0.0 - 0.2 %   Neutrophils Relative % 68 %   Neutro Abs 5.5 1.7 - 7.7 K/uL   Lymphocytes Relative 20 %   Lymphs Abs 1.6 0.7 - 4.0 K/uL   Monocytes Relative 9 %   Monocytes Absolute 0.7 0.1 - 1.0 K/uL   Eosinophils Relative 1 %   Eosinophils Absolute 0.1 0.0 - 0.5 K/uL   Basophils Relative 1 %   Basophils Absolute 0.1 0.0 - 0.1 K/uL   Immature Granulocytes 1 %   Abs Immature Granulocytes 0.06 0.00 - 0.07 K/uL    Comment: Performed at [facility], [address] [place],  [place] [address]  CEA     Status: None   Collection Time: 10/03/24  2:21 PM  Result Value Ref Range   CEA 2.8 0.0 - 4.7 ng/mL    Comment: (NOTE)                             Nonsmokers          <3.9                             Smokers             <5.6 [employer] Electrochemiluminescence Immunoassay (ECLIA) Values obtained with different assay methods or kits cannot be used interchangeably.  Results cannot be interpreted as absolute evidence of the presence or absence of malignant disease. Performed At: [facility] [address] [place], [place] [id number] Jennette Shorter MD Ey:[phone]   Magnesium     Status: None   Collection Time: 10/04/24 11:08 AM  Result Value Ref Range   Magnesium 1.9 1.7 - 2.4 mg/dL    Comment: Performed at [facility], [address], [place], [place] [address]  CBC     Status: Abnormal   Collection Time: 10/04/24 11:08 AM  Result Value Ref Range   WBC 7.5 4.0 - 10.5 K/uL   RBC 4.39 3.87 - 5.11 MIL/uL   Hemoglobin 9.9 (L) 12.0 - 15.0 g/dL   HCT 65.6 (L) 63.9 - 53.9 %   MCV 78.1 (L) 80.0 - 100.0 fL   MCH 22.6 (L) 26.0 - 34.0 pg   MCHC 28.9 (L) 30.0 - 36.0 g/dL   RDW 78.7 (H) 88.4 - 84.4 %   Platelets 452 (H) 150 - 400 K/uL   nRBC 0.0 0.0 - 0.2 %    Comment: Performed at [facility], [address], [place], [place] [address]  Basic metabolic panel with GFR     Status: Abnormal   Collection Time: 10/04/24 11:08 AM  Result Value Ref Range   Sodium 137 135 - 145 mmol/L   Potassium 4.1 3.5 - 5.1 mmol/L   Chloride 105 98 - 111 mmol/L   CO2 23 22 - 32 mmol/L   Glucose, Bld 110 (H) 70 - 99 mg/dL    Comment: Glucose reference range applies only to samples taken after fasting for at least 8 hours.   BUN 16 8 - 23 mg/dL   Creatinine, Ser 8.47 (H) 0.44 - 1.00 mg/dL   Calcium 87.9 (H) 8.9 - 10.3 mg/dL   GFR, Estimated 35 (L) >60 mL/min    Comment: (NOTE) Calculated using the CKD-EPI Creatinine Equation (2021)    Anion gap 9 5 - 15    Comment: Performed at [facility], [address], [place], [place] [address]   CT CHEST ABDOMEN PELVIS W CONTRAST Result Date: 10/03/2024 EXAM: CT CHEST, ABDOMEN AND PELVIS WITH CONTRAST 10/03/2024 07:39:24 PM TECHNIQUE: CT of the chest, abdomen and pelvis was performed with the administration of 80 mL of iohexol  (OMNIPAQUE ) 300 MG/ML solution. Multiplanar reformatted images are provided for review. Automated exposure control, iterative reconstruction, and/or  weight based adjustment of the mA/kV was utilized to reduce the radiation dose to as low as reasonably achievable. COMPARISON: CT of the chest 09/24/2013. CLINICAL HISTORY: Colon cancer, staging. FINDINGS: CHEST: MEDIASTINUM AND LYMPH NODES: Heart and pericardium: Atherosclerotic calcifications of the coronary arteries. Aorta: Atherosclerotic calcifications  of the aorta. The central airways are clear. Enlarged tracheoesophageal lymph node measuring 12 mm short axis at the thoracic inlet, right side, new from prior. No other enlarged lymph nodes are seen. LUNGS AND PLEURA: 6 mm right upper lobe nodule (image 6/70). 4 mm nodule in the left upper lobe (image 6/70). Right apical nodule measuring 8 mm (image 6/39). 2 mm nodule in the right upper lobe (image 6/40). 2 mm nodule in the right lower lobe (image 6/97). No focal consolidation or pulmonary edema. No pleural effusion or pneumothorax. ABDOMEN AND PELVIS: LIVER: The liver is unremarkable. GALLBLADDER AND BILE DUCTS: Small gallstones present. No biliary ductal dilatation. SPLEEN: No acute abnormality. PANCREAS: Punctate calcifications in the head of the pancreas. ADRENAL GLANDS: Bilateral low-density adrenal nodules are present compatible with adenomas. Left adrenal adenoma measures 1.5 x 3.6 cm. Right adrenal adenoma measures 2.6 x 1.3 cm. KIDNEYS, URETERS AND BLADDER: No stones in the kidneys or ureters. No hydronephrosis. No perinephric or periureteral stranding. Urinary bladder is unremarkable. GI AND BOWEL: Stomach demonstrates no acute abnormality. Colonic mass identified near the hepatic flexure measuring 1.8 x 2.9 x 3.6 cm. Appendix is normal. There is no bowel obstruction. REPRODUCTIVE ORGANS: No acute abnormality. PERITONEUM AND RETROPERITONEUM: No ascites. No free air. VASCULATURE: Aorta is normal in caliber. Atherosclerotic calcifications of the aorta and iliac arteries. ABDOMINAL AND PELVIS LYMPH NODES: No lymphadenopathy. BONES AND SOFT TISSUES:  Chronic compression deformity of the superior endplate of L3 and T11. No focal soft tissue abnormality. IMPRESSION: 1. Colonic mass near the hepatic flexure measuring 1.8 x 2.9 x 3.6 cm, consistent with colon cancer. 2. Enlarged tracheosophageal lymph node measuring 12 mm short axis at the thoracic and right, new from prior. No other enlarged lymph nodes are seen. 3. Multiple pulmonary nodules, the largest measuring 8 mm in the right upper lobe. Metastatic disease is a possibility given other findings. Electronically signed by: Greig Pique MD 10/03/2024 07:56 PM EDT RP Workstation: [id number]   ECHOCARDIOGRAM COMPLETE Result Date: 10/02/2024    ECHOCARDIOGRAM REPORT   Patient Name:   BRENN GATTON Date of Exam: 10/02/2024 Medical Rec #:  [id number]   Height:       63.0 in Accession #:    [id number]  Weight:       221.1 lb Date of Birth:  Oct 13, 1946   BSA:          2.018 m Patient Age:    77 years    BP:           144/63 mmHg Patient Gender: F           HR:           76 bpm. Exam Location:  Inpatient Procedure: 2D Echo (Both Spectral and Color Flow Doppler were utilized during            procedure). Indications:    CHF  History:        Patient has prior history of Echocardiogram examinations. CHF.  Sonographer:    Norleen Amour Referring Phys: [phone] DALTON S MCLEAN IMPRESSIONS  1. Left ventricular ejection fraction, by estimation, is 30 to 35%. The left ventricle has moderately decreased function. The left ventricle demonstrates global hypokinesis. There is mild left ventricular hypertrophy. Left ventricular diastolic parameters are consistent with Grade III diastolic dysfunction (restrictive).  2. Right ventricular systolic function is normal. The right ventricular size is normal.  3. Left atrial size was severely dilated.  4. The mitral valve is normal in structure.  Mild mitral valve regurgitation. No evidence of mitral stenosis.  5. The aortic valve is tricuspid. Aortic valve regurgitation is trivial. No aortic  stenosis is present.  6. The inferior vena cava is normal in size with greater than 50% respiratory variability, suggesting right atrial pressure of 3 mmHg. FINDINGS  Left Ventricle: Left ventricular ejection fraction, by estimation, is 30 to 35%. The left ventricle has moderately decreased function. The left ventricle demonstrates global hypokinesis. The left ventricular internal cavity size was normal in size. There is mild left ventricular hypertrophy. Left ventricular diastolic parameters are consistent with Grade III diastolic dysfunction (restrictive). Right Ventricle: The right ventricular size is normal. Right ventricular systolic function is normal. Left Atrium: Left atrial size was severely dilated. Right Atrium: Right atrial size was normal in size. Pericardium: There is no evidence of pericardial effusion. Mitral Valve: The mitral valve is normal in structure. Mild mitral valve regurgitation. No evidence of mitral valve stenosis. MV peak gradient, 8.4 mmHg. The mean mitral valve gradient is 2.0 mmHg. Tricuspid Valve: The tricuspid valve is normal in structure. Tricuspid valve regurgitation is trivial. No evidence of tricuspid stenosis. Aortic Valve: The aortic valve is tricuspid. Aortic valve regurgitation is trivial. Aortic regurgitation PHT measures 694 msec. No aortic stenosis is present. Aortic valve mean gradient measures 7.5 mmHg. Aortic valve peak gradient measures 16.4 mmHg. Aortic valve area, by VTI measures 1.29 cm. Pulmonic Valve: The pulmonic valve was normal in structure. Pulmonic valve regurgitation is trivial. No evidence of pulmonic stenosis. Aorta: The aortic root is normal in size and structure. Venous: The inferior vena cava is normal in size with greater than 50% respiratory variability, suggesting right atrial pressure of 3 mmHg. IAS/Shunts: No atrial level shunt detected by color flow Doppler.  LEFT VENTRICLE PLAX 2D LVIDd:         6.00 cm      Diastology LVIDs:         4.10 cm       LV e' medial:    7.18 cm/s LV PW:         0.90 cm      LV E/e' medial:  20.6 LV IVS:        0.80 cm      LV e' lateral:   9.57 cm/s LVOT diam:     2.15 cm      LV E/e' lateral: 15.5 LV SV:         47 LV SV Index:   23 LVOT Area:     3.63 cm  LV Volumes (MOD) LV vol d, MOD A2C: 102.0 ml LV vol d, MOD A4C: 117.0 ml LV vol s, MOD A2C: 60.5 ml LV vol s, MOD A4C: 54.0 ml LV SV MOD A2C:     41.5 ml LV SV MOD A4C:     117.0 ml LV SV MOD BP:      51.3 ml RIGHT VENTRICLE             IVC RV Basal diam:  3.10 cm     IVC diam: 1.80 cm RV S prime:     11.20 cm/s TAPSE (M-mode): 1.7 cm LEFT ATRIUM              Index        RIGHT ATRIUM           Index LA diam:        4.80 cm  2.38 cm/m   RA Area:     18.60 cm  LA Vol (A2C):   115.0 ml 56.99 ml/m  RA Volume:   50.30 ml  24.93 ml/m LA Vol (A4C):   97.8 ml  48.47 ml/m LA Biplane Vol: 108.0 ml 53.52 ml/m  AORTIC VALVE                     PULMONIC VALVE AV Area (Vmax):    1.29 cm      PV Vmax:          1.14 m/s AV Area (Vmean):   1.46 cm      PV Peak grad:     5.2 mmHg AV Area (VTI):     1.29 cm      PR End Diast Vel: 7.62 msec AV Vmax:           202.25 cm/s AV Vmean:          128.250 cm/s AV VTI:            0.363 m AV Peak Grad:      16.4 mmHg AV Mean Grad:      7.5 mmHg LVOT Vmax:         72.03 cm/s LVOT Vmean:        51.733 cm/s LVOT VTI:          0.129 m LVOT/AV VTI ratio: 0.36 AI PHT:            694 msec  AORTA Ao Root diam: 2.40 cm Ao Asc diam:  3.20 cm MITRAL VALVE MV Area (PHT): 3.14 cm       SHUNTS MV Area VTI:   1.56 cm       Systemic VTI:  0.13 m MV Peak grad:  8.4 mmHg       Systemic Diam: 2.15 cm MV Mean grad:  2.0 mmHg MV Vmax:       1.45 m/s MV Vmean:      65.5 cm/s MR Peak grad:    113.2 mmHg MR Mean grad:    68.0 mmHg MR Vmax:         532.00 cm/s MR Vmean:        394.0 cm/s MR PISA:         1.01 cm MR PISA Eff ROA: 6 mm MR PISA Radius:  0.40 cm MV E velocity: 148.00 cm/s MV A velocity: 45.90 cm/s MV E/A ratio:  3.22 Redell Shallow MD Electronically  signed by Redell Shallow MD Signature Date/Time: 10/02/2024/3:25:28 PM    Final     Assessment/Plan 78 y/o F with a hepatic mass c/f malignancy with possible mets to the lungs  - There is no indication for urgent surgical intervention, however, given her anemia and need to resume anticoagulation I do believe that she would benefit from a partial colectomy during this admission. Given that this is non-urgent, it would likely be early next week. I will discuss with the oncoming general surgeon to finalize a plan.  - Can trial heparin  gtt and evaluate for signs of ongoing bleeding over the weekend - Okay to continue regular diet for now. Will need to re-prep on Sunday given that she has been on a diet - Surgery will follow in the periphery over the weekend with tentative plan for OR Monday - Please reach out with questions or concerns  Cordella DELENA Polly Marlis Cheron Surgery 10/04/2024, 2:04 PM Please see Amion for pager number during day hours 7:00am-4:30pm or 7:00am -11:30am on weekends

## 2024-10-04 NOTE — Plan of Care (Signed)

## 2024-10-05 DIAGNOSIS — I482 Chronic atrial fibrillation, unspecified: Secondary | ICD-10-CM

## 2024-10-05 DIAGNOSIS — I5020 Unspecified systolic (congestive) heart failure: Secondary | ICD-10-CM | POA: Diagnosis not present

## 2024-10-05 DIAGNOSIS — D649 Anemia, unspecified: Secondary | ICD-10-CM | POA: Diagnosis not present

## 2024-10-05 MED ORDER — ONDANSETRON HCL 4 MG PO TABS: 4.0000 mg | ORAL_TABLET | Freq: Four times a day (QID) | ORAL | Status: DC | PRN

## 2024-10-05 MED ORDER — ONDANSETRON HCL 4 MG/2ML IJ SOLN
4.0000 mg | Freq: Four times a day (QID) | INTRAMUSCULAR | Status: DC | PRN
Start: 2024-10-05 — End: 2024-10-10

## 2024-10-05 MED ORDER — ACETAMINOPHEN 325 MG PO TABS: 650.0000 mg | ORAL_TABLET | Freq: Four times a day (QID) | ORAL | Status: DC | PRN

## 2024-10-05 MED ORDER — ACETAMINOPHEN 650 MG RE SUPP: 650.0000 mg | Freq: Four times a day (QID) | RECTAL | Status: DC | PRN

## 2024-10-05 MED ORDER — SENNOSIDES-DOCUSATE SODIUM 8.6-50 MG PO TABS: 1.0000 | ORAL_TABLET | Freq: Every evening | ORAL | Status: DC | PRN

## 2024-10-05 NOTE — Progress Notes (Signed)
 Progress Note  Patient Name: Kelli Webster Date of Encounter: 10/05/2024 Primary Cardiologist:  Rolan  Subjective   Overnight asymptomatic   Vital Signs    Vitals:   10/05/24 0100 10/05/24 0440 10/05/24 0500 10/05/24 0600  BP:  138/66    Pulse:  61    Resp: 18 18  18   Temp:  98.1 F (36.7 C)    TempSrc:  Oral    SpO2:  99%    Weight:   102.6 kg   Height:        Intake/Output Summary (Last 24 hours) at 10/05/2024 0758 Last data filed at 10/04/2024 1800 Gross per 24 hour  Intake 402.5 ml  Output --  Net 402.5 ml   Filed Weights   10/03/24 0500 10/04/24 0524 10/05/24 0500  Weight: 95.2 kg 100.7 kg 102.6 kg    Physical Exam   GEN: No acute distress.   Neck: No JVD Cardiac: iRRR, no murmurs, rubs, or gallops.  Respiratory: Clear to auscultation bilaterally. GI: Soft, nontender, non-distended  MS: No edema  Labs   Telemetry: AF    Chemistry Recent Labs  Lab 09/30/24 1432 10/01/24 1337 10/02/24 0349 10/03/24 0545 10/04/24 1108  NA 140   < > 140 140 137  K 5.0   < > 4.1 4.3 4.1  CL 107   < > 107 105 105  CO2 24   < > 23 23 23   GLUCOSE 119*   < > 85 86 110*  BUN 22   < > 22 20 16   CREATININE 1.56*   < > 1.45* 1.53* 1.52*  CALCIUM 11.7*   < > 11.1* 12.1* 12.0*  PROT 5.9*  --   --  6.1*  --   ALBUMIN 3.8  --  3.5 3.9  --   AST 21  --   --  27  --   ALT 14  --   --  14  --   ALKPHOS 75  --   --  76  --   BILITOT 0.5  --   --  1.3*  --   GFRNONAA 34*   < > 37* 35* 35*  ANIONGAP 10   < > 10 12 9    < > = values in this interval not displayed.     Hematology Recent Labs  Lab 10/02/24 1731 10/03/24 0545 10/04/24 1108  WBC 8.1 8.0 7.5  RBC 4.34 4.69 4.39  HGB 9.8* 10.0* 9.9*  HCT 33.2* 35.9* 34.3*  MCV 76.5* 76.5* 78.1*  MCH 22.6* 21.3* 22.6*  MCHC 29.5* 27.9* 28.9*  RDW 19.6* 20.2* 21.2*  PLT 445* 506* 452*    Cardiac Studies   Cardiac Studies & Procedures    ______________________________________________________________________________________________     ECHOCARDIOGRAM  ECHOCARDIOGRAM COMPLETE 10/02/2024  Narrative ECHOCARDIOGRAM REPORT    Patient Name:   MAGDALINE ZOLLARS Date of Exam: 10/02/2024 Medical Rec #:  [id number]   Height:       63.0 in Accession #:    [id number]  Weight:       221.1 lb Date of Birth:  1946/04/26   BSA:          2.018 m Patient Age:    78 years    BP:           144/63 mmHg Patient Gender: F           HR:           76 bpm. Exam Location:  Inpatient  Procedure: 2D  Echo (Both Spectral and Color Flow Doppler were utilized during procedure).  Indications:    CHF  History:        Patient has prior history of Echocardiogram examinations. CHF.  Sonographer:    Norleen Amour Referring Phys: [phone] DALTON S MCLEAN  IMPRESSIONS   1. Left ventricular ejection fraction, by estimation, is 30 to 35%. The left ventricle has moderately decreased function. The left ventricle demonstrates global hypokinesis. There is mild left ventricular hypertrophy. Left ventricular diastolic parameters are consistent with Grade III diastolic dysfunction (restrictive). 2. Right ventricular systolic function is normal. The right ventricular size is normal. 3. Left atrial size was severely dilated. 4. The mitral valve is normal in structure. Mild mitral valve regurgitation. No evidence of mitral stenosis. 5. The aortic valve is tricuspid. Aortic valve regurgitation is trivial. No aortic stenosis is present. 6. The inferior vena cava is normal in size with greater than 50% respiratory variability, suggesting right atrial pressure of 3 mmHg.  FINDINGS Left Ventricle: Left ventricular ejection fraction, by estimation, is 30 to 35%. The left ventricle has moderately decreased function. The left ventricle demonstrates global hypokinesis. The left ventricular internal cavity size was normal in size. There is mild left ventricular hypertrophy.  Left ventricular diastolic parameters are consistent with Grade III diastolic dysfunction (restrictive).  Right Ventricle: The right ventricular size is normal. Right ventricular systolic function is normal.  Left Atrium: Left atrial size was severely dilated.  Right Atrium: Right atrial size was normal in size.  Pericardium: There is no evidence of pericardial effusion.  Mitral Valve: The mitral valve is normal in structure. Mild mitral valve regurgitation. No evidence of mitral valve stenosis. MV peak gradient, 8.4 mmHg. The mean mitral valve gradient is 2.0 mmHg.  Tricuspid Valve: The tricuspid valve is normal in structure. Tricuspid valve regurgitation is trivial. No evidence of tricuspid stenosis.  Aortic Valve: The aortic valve is tricuspid. Aortic valve regurgitation is trivial. Aortic regurgitation PHT measures 694 msec. No aortic stenosis is present. Aortic valve mean gradient measures 7.5 mmHg. Aortic valve peak gradient measures 16.4 mmHg. Aortic valve area, by VTI measures 1.29 cm.  Pulmonic Valve: The pulmonic valve was normal in structure. Pulmonic valve regurgitation is trivial. No evidence of pulmonic stenosis.  Aorta: The aortic root is normal in size and structure.  Venous: The inferior vena cava is normal in size with greater than 50% respiratory variability, suggesting right atrial pressure of 3 mmHg.  IAS/Shunts: No atrial level shunt detected by color flow Doppler.   LEFT VENTRICLE PLAX 2D LVIDd:         6.00 cm      Diastology LVIDs:         4.10 cm      LV e' medial:    7.18 cm/s LV PW:         0.90 cm      LV E/e' medial:  20.6 LV IVS:        0.80 cm      LV e' lateral:   9.57 cm/s LVOT diam:     2.15 cm      LV E/e' lateral: 15.5 LV SV:         47 LV SV Index:   23 LVOT Area:     3.63 cm  LV Volumes (MOD) LV vol d, MOD A2C: 102.0 ml LV vol d, MOD A4C: 117.0 ml LV vol s, MOD A2C: 60.5 ml LV vol s, MOD A4C: 54.0 ml LV SV MOD  A2C:     41.5 ml LV SV  MOD A4C:     117.0 ml LV SV MOD BP:      51.3 ml  RIGHT VENTRICLE             IVC RV Basal diam:  3.10 cm     IVC diam: 1.80 cm RV S prime:     11.20 cm/s TAPSE (M-mode): 1.7 cm  LEFT ATRIUM              Index        RIGHT ATRIUM           Index LA diam:        4.80 cm  2.38 cm/m   RA Area:     18.60 cm LA Vol (A2C):   115.0 ml 56.99 ml/m  RA Volume:   50.30 ml  24.93 ml/m LA Vol (A4C):   97.8 ml  48.47 ml/m LA Biplane Vol: 108.0 ml 53.52 ml/m AORTIC VALVE                     PULMONIC VALVE AV Area (Vmax):    1.29 cm      PV Vmax:          1.14 m/s AV Area (Vmean):   1.46 cm      PV Peak grad:     5.2 mmHg AV Area (VTI):     1.29 cm      PR End Diast Vel: 7.62 msec AV Vmax:           202.25 cm/s AV Vmean:          128.250 cm/s AV VTI:            0.363 m AV Peak Grad:      16.4 mmHg AV Mean Grad:      7.5 mmHg LVOT Vmax:         72.03 cm/s LVOT Vmean:        51.733 cm/s LVOT VTI:          0.129 m LVOT/AV VTI ratio: 0.36 AI PHT:            694 msec  AORTA Ao Root diam: 2.40 cm Ao Asc diam:  3.20 cm  MITRAL VALVE MV Area (PHT): 3.14 cm       SHUNTS MV Area VTI:   1.56 cm       Systemic VTI:  0.13 m MV Peak grad:  8.4 mmHg       Systemic Diam: 2.15 cm MV Mean grad:  2.0 mmHg MV Vmax:       1.45 m/s MV Vmean:      65.5 cm/s MR Peak grad:    113.2 mmHg MR Mean grad:    68.0 mmHg MR Vmax:         532.00 cm/s MR Vmean:        394.0 cm/s MR PISA:         1.01 cm MR PISA Eff ROA: 6 mm MR PISA Radius:  0.40 cm MV E velocity: 148.00 cm/s MV A velocity: 45.90 cm/s MV E/A ratio:  3.22  Redell Shallow MD Electronically signed by Redell Shallow MD Signature Date/Time: 10/02/2024/3:25:28 PM    Final          ______________________________________________________________________________________________      Assessment & Plan    Heart failure with reduced ejection fraction - NYHA I, euvolemic, Stage C - Ejection fraction decreased to 30-35%. Unable  to  cardiovert due to inability to tolerate blood thinners. - Continue GDMT with beta blocker, SGLT2 inhibitor, MRA, and ARB - No lasix  needed now    Persistent atrial fibrillation - Continue amiodarone  - Stop Eliquis  until cleared by GI (after mass eval); she is aware of stroke risk - she has had AF pauses with no sx < 5s; no indication for PPM outside of HF reasons (as above) this can be discussed as outpatient  Colon Cancer:  Hct improved 34.3 ? To have colectomy this admission    For questions or updates, please contact [facility] Please consult [email or web address] for contact info under Cardiology/STEMI.      Stanly Leavens, MD FASE [facility] Cardiologist [facility]  [address] [place], [address] [place], [place] [address] [phone]  7:58 AM

## 2024-10-05 NOTE — Plan of Care (Signed)

## 2024-10-05 NOTE — Progress Notes (Signed)
 [facility] Progress Note Patient: Kelli Webster FMW:[id number] DOB: April 01, 1946  DOA: 10/01/2024 DOS: the patient was seen and examined on 10/05/2024  Brief Hospital Course: Patient with PMH of Chronic combined CHF, Eliquis  for A-fib, CKD 3B, OSA, HTN, primary hyperparathyroidism with recently scheduled outpatient surgery, obesity Presented to the hospital with complaints of abnormal lab. Received 1 PRBC transfusion.  [facility] consulted.  Will monitor recommendation. Assessment and Plan: Symptomatic blood loss anemia. Status post EGD colonoscopy on 10/16. Colonic mass. Severe gastritis. Multiple colonic polyps. Likely chronic in nature. Patient never had a colonoscopy. Initially patient was planning to get procedure done outpatient although after further discussion patient was agreeable to EGD colonoscopy in the hospital. Hemoglobin was 7.1 upon admission, improved to 9, baseline appears to be around 11 for the patient. Iron level was low which was replaced. Continue with PPI EGD showed severe gastritis and colonoscopy shows hepatic mass. Appreciate GI consultation. CT chest abdomen pelvis shows hepatic flexure mass as well as some pulmonary nodules. CEA normal. Appreciate general surgery consultation. Will require surgical colectomy this admission. Scheduled for surgery on Monday at 7:15 AM. Discussed with cardiology, okay to hold anticoagulation for now.   Paroxysmal A-fib. On anticoagulation with Eliquis . Currently appears to be rate controlled. Continue amiodarone  and Coreg . Hold anticoagulation for now per my discussion with cardiology.   Primary hyperparathyroidism. Mild hypercalcemia. Currently scheduled for outpatient procedure.  Surgery will be now postponed until patient recovers from colectomy. Will monitor.   Chronic combined systolic and diastolic CHF. Echocardiogram shows EF of 40 to 45% with grade 1 diastolic dysfunction. Does not appear to be severely  volume overloaded. Repeat echocardiogram shows worsening EF.  Appreciate cardio consultation.  No further workup right now. Will monitor.   Obesity Class 1 Body mass index is 37.18 kg/m.  Placing the pt at higher risk of poor outcomes.  Normal   Subjective: No nausea or vomiting.  No other acute complaint.  Passing gas.  Physical Exam: Clear to auscultation. S1-S2 present No edema. No abdominal tenderness.  Data Reviewed: I have Reviewed nursing notes, Vitals, and Lab results. Since last encounter, pertinent lab results CBC and BMP   . I have ordered test including CBC and BMP  .   Disposition: Status is: Inpatient Remains inpatient appropriate because: Monitor for postop recovery.  SCDs Start: 10/01/24 1850   Family Communication: Husband at bedside Level of care: Telemetry   Vitals:   10/05/24 0500 10/05/24 0600 10/05/24 1216 10/05/24 1400  BP:   130/60   Pulse:   68   Resp:  18 20   Temp:    98 F (36.7 C)  TempSrc:      SpO2:   100%   Weight: 102.6 kg     Height:         Author: Yetta Blanch, MD 10/05/2024 6:03 PM  Please look on [email or web address] to find out who is on call.

## 2024-10-06 ENCOUNTER — Other Ambulatory Visit (HOSPITAL_COMMUNITY): Payer: Self-pay | Admitting: Cardiology

## 2024-10-06 ENCOUNTER — Encounter (HOSPITAL_COMMUNITY): Payer: Self-pay | Admitting: Gastroenterology

## 2024-10-06 DIAGNOSIS — D649 Anemia, unspecified: Secondary | ICD-10-CM | POA: Diagnosis not present

## 2024-10-06 DIAGNOSIS — I4819 Other persistent atrial fibrillation: Secondary | ICD-10-CM | POA: Diagnosis not present

## 2024-10-06 LAB — CBC
HCT: 32.3 % — ABNORMAL LOW (ref 36.0–46.0)
Hemoglobin: 9.3 g/dL — ABNORMAL LOW (ref 12.0–15.0)
MCH: 22.9 pg — ABNORMAL LOW (ref 26.0–34.0)
MCHC: 28.8 g/dL — ABNORMAL LOW (ref 30.0–36.0)
MCV: 79.6 fL — ABNORMAL LOW (ref 80.0–100.0)
Platelets: 363 K/uL (ref 150–400)
RBC: 4.06 MIL/uL (ref 3.87–5.11)
RDW: 23.1 % — ABNORMAL HIGH (ref 11.5–15.5)
WBC: 5.8 K/uL (ref 4.0–10.5)
nRBC: 0 % (ref 0.0–0.2)

## 2024-10-06 LAB — BASIC METABOLIC PANEL WITH GFR
Anion gap: 9 (ref 5–15)
BUN: 12 mg/dL (ref 8–23)
CO2: 24 mmol/L (ref 22–32)
Calcium: 11.8 mg/dL — ABNORMAL HIGH (ref 8.9–10.3)
Chloride: 109 mmol/L (ref 98–111)
Creatinine, Ser: 1.42 mg/dL — ABNORMAL HIGH (ref 0.44–1.00)
GFR, Estimated: 38 mL/min — ABNORMAL LOW (ref >60)
Glucose, Bld: 82 mg/dL (ref 70–99)
Potassium: 4 mmol/L (ref 3.5–5.1)
Sodium: 142 mmol/L (ref 135–145)

## 2024-10-06 LAB — MAGNESIUM: Magnesium: 1.9 mg/dL (ref 1.7–2.4)

## 2024-10-06 NOTE — Plan of Care (Signed)

## 2024-10-06 NOTE — Progress Notes (Signed)
 PROGRESS NOTE    Kelli Webster  FMW:[id number] DOB: March 04, 1946 DOA: 10/01/2024 PCP: [facility]    Brief Narrative:  Patient with PMH of Chronic combined CHF, Eliquis  for A-fib, CKD 3B, OSA, HTN, primary hyperparathyroidism with recently scheduled outpatient surgery, obesity Presented to the hospital with complaints of abnormal lab. Received 1 PRBC transfusion.  Underwent EGD and colonoscopy.  Found to have hepatic flexure mass. For surgery tomorrow.  Subjective: Patient seen and examined.  Today she denies any complaints.  She is anticipating to go for surgery.  Telemetry monitor shows rate controlled A-fib. Assessment & Plan:   Symptomatic blood loss anemia. Status post EGD colonoscopy on 10/16. Found to have Colonic mass. Severe gastritis. Multiple colonic polyps. Likely chronic in nature. Patient never had a colonoscopy. Hemoglobin was 7.1 upon admission, improved to 9, baseline appears to be around 11 for the patient. Iron level was low which was replaced. Continue with PPI EGD showed severe gastritis and colonoscopy shows hepatic mass. CT chest abdomen pelvis shows hepatic flexure mass as well as some pulmonary nodules. CEA normal. Appreciate general surgery consultation. Scheduled for surgery on Monday at 7:15 AM. Cardiology also following.   Paroxysmal A-fib. On anticoagulation with Eliquis . Currently appears to be rate controlled. Continue amiodarone  and Coreg . Hold anticoagulation for now , heparin  post surgery as soon possible.    Primary hyperparathyroidism. Mild hypercalcemia. Currently scheduled for outpatient procedure.  Surgery will be now postponed until patient recovers from colectomy. Will monitor.   Chronic combined systolic and diastolic CHF. Echocardiogram shows EF of 40 to 45% with grade 1 diastolic dysfunction. Does not appear to be volume overloaded. Repeat echocardiogram shows worsening EF.  Appreciate cardio  consultation.  No further workup right now. Will monitor.      DVT prophylaxis: SCDs Start: 10/01/24 1850   Code Status: Full code Family Communication: None at the bedside Disposition Plan: Status is: Inpatient Remains inpatient appropriate because: For surgery.     Consultants:  Cardiology General surgery  Procedures:  None  Antimicrobials:  None     Objective: Vitals:   10/05/24 1400 10/05/24 1955 10/06/24 0500 10/06/24 0538  BP:  107/78  131/62  Pulse:  66  71  Resp:  18  18  Temp: 98 F (36.7 C) 98.3 F (36.8 C)  98.1 F (36.7 C)  TempSrc:  Oral  Oral  SpO2:  97%  95%  Weight:   103.4 kg   Height:        Intake/Output Summary (Last 24 hours) at 10/06/2024 0754 Last data filed at 10/05/2024 1700 Gross per 24 hour  Intake 1540 ml  Output --  Net 1540 ml   Filed Weights   10/04/24 0524 10/05/24 0500 10/06/24 0500  Weight: 100.7 kg 102.6 kg 103.4 kg    Examination:  Looks fairly comfortable. Alert awake and oriented.  No focal logical deficits. Bilateral clear. Irregularly irregular, no murmurs. Soft.  Nontender.  Bowel sound present.   Data Reviewed: I have personally reviewed following labs and imaging studies  CBC: Recent Labs  Lab 10/02/24 0349 10/02/24 1731 10/03/24 0545 10/04/24 1108 10/06/24 0555  WBC 6.6 8.1 8.0 7.5 5.8  NEUTROABS  --  5.3 5.5  --   --   HGB 9.0* 9.8* 10.0* 9.9* 9.3*  HCT 31.0* 33.2* 35.9* 34.3* 32.3*  MCV 75.6* 76.5* 76.5* 78.1* 79.6*  PLT 448* 445* 506* 452* 363   Basic Metabolic Panel: Recent Labs  Lab 10/01/24 1337 10/02/24 0349 10/03/24  0545 10/04/24 1108 10/06/24 0555  NA 139 140 140 137 142  K 4.5 4.1 4.3 4.1 4.0  CL 105 107 105 105 109  CO2 23 23 23 23 24   GLUCOSE 140* 85 86 110* 82  BUN 26* 22 20 16 12   CREATININE 1.71* 1.45* 1.53* 1.52* 1.42*  CALCIUM 11.8* 11.1* 12.1* 12.0* 11.8*  MG  --   --  2.0 1.9 1.9   GFR: Estimated Creatinine Clearance: 38.1 mL/min (A) (by C-G formula based  on SCr of 1.42 mg/dL (H)). Liver Function Tests: Recent Labs  Lab 09/30/24 1432 10/02/24 0349 10/03/24 0545  AST 21  --  27  ALT 14  --  14  ALKPHOS 75  --  76  BILITOT 0.5  --  1.3*  PROT 5.9*  --  6.1*  ALBUMIN 3.8 3.5 3.9   No results for input(s): LIPASE, AMYLASE in the last 168 hours. No results for input(s): AMMONIA in the last 168 hours. Coagulation Profile: No results for input(s): INR, PROTIME in the last 168 hours. Cardiac Enzymes: No results for input(s): CKTOTAL, CKMB, CKMBINDEX, TROPONINI in the last 168 hours. BNP (last 3 results) No results for input(s): PROBNP in the last 8760 hours. HbA1C: No results for input(s): HGBA1C in the last 72 hours. CBG: No results for input(s): GLUCAP in the last 168 hours. Lipid Profile: No results for input(s): CHOL, HDL, LDLCALC, TRIG, CHOLHDL, LDLDIRECT in the last 72 hours. Thyroid  Function Tests: No results for input(s): TSH, T4TOTAL, FREET4, T3FREE, THYROIDAB in the last 72 hours. Anemia Panel: No results for input(s): VITAMINB12, FOLATE, FERRITIN, TIBC, IRON, RETICCTPCT in the last 72 hours. Sepsis Labs: No results for input(s): PROCALCITON, LATICACIDVEN in the last 168 hours.  No results found for this or any previous visit (from the past 240 hours).       Radiology Studies: No results found.      Scheduled Meds:  amiodarone   200 mg Oral q AM   pantoprazole  (PROTONIX ) IV  40 mg Intravenous Q12H   Continuous Infusions:   LOS: 5 days    Time spent: 35 minutes    Renato Applebaum, MD [facility]

## 2024-10-06 NOTE — Plan of Care (Signed)

## 2024-10-06 NOTE — Progress Notes (Signed)
 Progress Note  Patient Name: Kelli Webster Date of Encounter: 10/06/2024 Primary Cardiologist:  Rolan  Subjective   Overnight asymptomatic for surgery in am   Vital Signs    Vitals:   10/05/24 1400 10/05/24 1955 10/06/24 0500 10/06/24 0538  BP:  107/78  131/62  Pulse:  66  71  Resp:  18  18  Temp: 98 F (36.7 C) 98.3 F (36.8 C)  98.1 F (36.7 C)  TempSrc:  Oral  Oral  SpO2:  97%  95%  Weight:   103.4 kg   Height:        Intake/Output Summary (Last 24 hours) at 10/06/2024 0908 Last data filed at 10/05/2024 1700 Gross per 24 hour  Intake 1090 ml  Output --  Net 1090 ml   Filed Weights   10/04/24 0524 10/05/24 0500 10/06/24 0500  Weight: 100.7 kg 102.6 kg 103.4 kg    Physical Exam   GEN: No acute distress.   Neck: No JVD Cardiac: iRRR, no murmurs, rubs, or gallops.  Respiratory: Clear to auscultation bilaterally. GI: Soft, nontender, non-distended  MS: No edema  Labs   Telemetry: AF    Chemistry Recent Labs  Lab 09/30/24 1432 10/01/24 1337 10/02/24 0349 10/03/24 0545 10/04/24 1108 10/06/24 0555  NA 140   < > 140 140 137 142  K 5.0   < > 4.1 4.3 4.1 4.0  CL 107   < > 107 105 105 109  CO2 24   < > 23 23 23 24   GLUCOSE 119*   < > 85 86 110* 82  BUN 22   < > 22 20 16 12   CREATININE 1.56*   < > 1.45* 1.53* 1.52* 1.42*  CALCIUM 11.7*   < > 11.1* 12.1* 12.0* 11.8*  PROT 5.9*  --   --  6.1*  --   --   ALBUMIN 3.8  --  3.5 3.9  --   --   AST 21  --   --  27  --   --   ALT 14  --   --  14  --   --   ALKPHOS 75  --   --  76  --   --   BILITOT 0.5  --   --  1.3*  --   --   GFRNONAA 34*   < > 37* 35* 35* 38*  ANIONGAP 10   < > 10 12 9 9    < > = values in this interval not displayed.     Hematology Recent Labs  Lab 10/03/24 0545 10/04/24 1108 10/06/24 0555  WBC 8.0 7.5 5.8  RBC 4.69 4.39 4.06  HGB 10.0* 9.9* 9.3*  HCT 35.9* 34.3* 32.3*  MCV 76.5* 78.1* 79.6*  MCH 21.3* 22.6* 22.9*  MCHC 27.9* 28.9* 28.8*  RDW 20.2* 21.2* 23.1*  PLT 506*  452* 363    Cardiac Studies   Cardiac Studies & Procedures   ______________________________________________________________________________________________     ECHOCARDIOGRAM  ECHOCARDIOGRAM COMPLETE 10/02/2024  Narrative ECHOCARDIOGRAM REPORT    Patient Name:   ASUNCION TAPSCOTT Date of Exam: 10/02/2024 Medical Rec #:  [id number]   Height:       63.0 in Accession #:    [id number]  Weight:       221.1 lb Date of Birth:  [birth year]   BSA:          2.018 m Patient Age:    77 years    BP:  144/63 mmHg Patient Gender: F           HR:           76 bpm. Exam Location:  Inpatient  Procedure: 2D Echo (Both Spectral and Color Flow Doppler were utilized during procedure).  Indications:    CHF  History:        Patient has prior history of Echocardiogram examinations. CHF.  Sonographer:    Norleen Amour Referring Phys: [phone] DALTON S MCLEAN  IMPRESSIONS   1. Left ventricular ejection fraction, by estimation, is 30 to 35%. The left ventricle has moderately decreased function. The left ventricle demonstrates global hypokinesis. There is mild left ventricular hypertrophy. Left ventricular diastolic parameters are consistent with Grade III diastolic dysfunction (restrictive). 2. Right ventricular systolic function is normal. The right ventricular size is normal. 3. Left atrial size was severely dilated. 4. The mitral valve is normal in structure. Mild mitral valve regurgitation. No evidence of mitral stenosis. 5. The aortic valve is tricuspid. Aortic valve regurgitation is trivial. No aortic stenosis is present. 6. The inferior vena cava is normal in size with greater than 50% respiratory variability, suggesting right atrial pressure of 3 mmHg.  FINDINGS Left Ventricle: Left ventricular ejection fraction, by estimation, is 30 to 35%. The left ventricle has moderately decreased function. The left ventricle demonstrates global hypokinesis. The left ventricular internal cavity size was  normal in size. There is mild left ventricular hypertrophy. Left ventricular diastolic parameters are consistent with Grade III diastolic dysfunction (restrictive).  Right Ventricle: The right ventricular size is normal. Right ventricular systolic function is normal.  Left Atrium: Left atrial size was severely dilated.  Right Atrium: Right atrial size was normal in size.  Pericardium: There is no evidence of pericardial effusion.  Mitral Valve: The mitral valve is normal in structure. Mild mitral valve regurgitation. No evidence of mitral valve stenosis. MV peak gradient, 8.4 mmHg. The mean mitral valve gradient is 2.0 mmHg.  Tricuspid Valve: The tricuspid valve is normal in structure. Tricuspid valve regurgitation is trivial. No evidence of tricuspid stenosis.  Aortic Valve: The aortic valve is tricuspid. Aortic valve regurgitation is trivial. Aortic regurgitation PHT measures 694 msec. No aortic stenosis is present. Aortic valve mean gradient measures 7.5 mmHg. Aortic valve peak gradient measures 16.4 mmHg. Aortic valve area, by VTI measures 1.29 cm.  Pulmonic Valve: The pulmonic valve was normal in structure. Pulmonic valve regurgitation is trivial. No evidence of pulmonic stenosis.  Aorta: The aortic root is normal in size and structure.  Venous: The inferior vena cava is normal in size with greater than 50% respiratory variability, suggesting right atrial pressure of 3 mmHg.  IAS/Shunts: No atrial level shunt detected by color flow Doppler.   LEFT VENTRICLE PLAX 2D LVIDd:         6.00 cm      Diastology LVIDs:         4.10 cm      LV e' medial:    7.18 cm/s LV PW:         0.90 cm      LV E/e' medial:  20.6 LV IVS:        0.80 cm      LV e' lateral:   9.57 cm/s LVOT diam:     2.15 cm      LV E/e' lateral: 15.5 LV SV:         47 LV SV Index:   23 LVOT Area:     3.63 cm  LV Volumes (MOD) LV vol d, MOD A2C: 102.0 ml LV vol d, MOD A4C: 117.0 ml LV vol s, MOD A2C: 60.5  ml LV vol s, MOD A4C: 54.0 ml LV SV MOD A2C:     41.5 ml LV SV MOD A4C:     117.0 ml LV SV MOD BP:      51.3 ml  RIGHT VENTRICLE             IVC RV Basal diam:  3.10 cm     IVC diam: 1.80 cm RV S prime:     11.20 cm/s TAPSE (M-mode): 1.7 cm  LEFT ATRIUM              Index        RIGHT ATRIUM           Index LA diam:        4.80 cm  2.38 cm/m   RA Area:     18.60 cm LA Vol (A2C):   115.0 ml 56.99 ml/m  RA Volume:   50.30 ml  24.93 ml/m LA Vol (A4C):   97.8 ml  48.47 ml/m LA Biplane Vol: 108.0 ml 53.52 ml/m AORTIC VALVE                     PULMONIC VALVE AV Area (Vmax):    1.29 cm      PV Vmax:          1.14 m/s AV Area (Vmean):   1.46 cm      PV Peak grad:     5.2 mmHg AV Area (VTI):     1.29 cm      PR End Diast Vel: 7.62 msec AV Vmax:           202.25 cm/s AV Vmean:          128.250 cm/s AV VTI:            0.363 m AV Peak Grad:      16.4 mmHg AV Mean Grad:      7.5 mmHg LVOT Vmax:         72.03 cm/s LVOT Vmean:        51.733 cm/s LVOT VTI:          0.129 m LVOT/AV VTI ratio: 0.36 AI PHT:            694 msec  AORTA Ao Root diam: 2.40 cm Ao Asc diam:  3.20 cm  MITRAL VALVE MV Area (PHT): 3.14 cm       SHUNTS MV Area VTI:   1.56 cm       Systemic VTI:  0.13 m MV Peak grad:  8.4 mmHg       Systemic Diam: 2.15 cm MV Mean grad:  2.0 mmHg MV Vmax:       1.45 m/s MV Vmean:      65.5 cm/s MR Peak grad:    113.2 mmHg MR Mean grad:    68.0 mmHg MR Vmax:         532.00 cm/s MR Vmean:        394.0 cm/s MR PISA:         1.01 cm MR PISA Eff ROA: 6 mm MR PISA Radius:  0.40 cm MV E velocity: 148.00 cm/s MV A velocity: 45.90 cm/s MV E/A ratio:  3.22  Redell Shallow MD Electronically signed by Redell Shallow MD Signature Date/Time: 10/02/2024/3:25:28 PM    Final  ______________________________________________________________________________________________      Assessment & Plan    Heart failure with reduced ejection fraction - NYHA I,  euvolemic, Stage C - Ejection fraction decreased to 30-35%. Unable to cardiovert due to inability to tolerate blood thinners. - Continue GDMT with beta blocker, SGLT2 inhibitor, MRA, and ARB - No lasix  needed now    Persistent atrial fibrillation - Continue amiodarone  - Eliquis  on hold for surgery  she is aware of stroke risk - she has had AF pauses with no sx < 5s; no indication for PPM outside of HF reasons (as above) this can be discussed as outpatient  Colon Cancer:  Hct improved 34.3 ? Partial colectomy in am     For questions or updates, please contact [facility] Please consult [email or web address] for contact info under Cardiology/STEMI.   Maude Emmer MD [facility]

## 2024-10-07 ENCOUNTER — Encounter (HOSPITAL_COMMUNITY): Payer: Self-pay | Admitting: Student

## 2024-10-07 ENCOUNTER — Inpatient Hospital Stay (HOSPITAL_COMMUNITY): Admitting: Anesthesiology

## 2024-10-07 ENCOUNTER — Encounter (HOSPITAL_COMMUNITY): Admission: RE | Payer: Self-pay | Source: Ambulatory Visit

## 2024-10-07 ENCOUNTER — Ambulatory Visit (HOSPITAL_COMMUNITY): Admission: RE | Admit: 2024-10-07 | Source: Ambulatory Visit | Admitting: Surgery

## 2024-10-07 ENCOUNTER — Encounter (HOSPITAL_COMMUNITY): Admission: EM | Disposition: A | Payer: Self-pay | Source: Ambulatory Visit | Attending: Student

## 2024-10-07 DIAGNOSIS — N189 Chronic kidney disease, unspecified: Secondary | ICD-10-CM

## 2024-10-07 DIAGNOSIS — I130 Hypertensive heart and chronic kidney disease with heart failure and stage 1 through stage 4 chronic kidney disease, or unspecified chronic kidney disease: Secondary | ICD-10-CM | POA: Diagnosis not present

## 2024-10-07 DIAGNOSIS — I5042 Chronic combined systolic (congestive) and diastolic (congestive) heart failure: Secondary | ICD-10-CM | POA: Diagnosis not present

## 2024-10-07 DIAGNOSIS — K6289 Other specified diseases of anus and rectum: Secondary | ICD-10-CM

## 2024-10-07 DIAGNOSIS — D649 Anemia, unspecified: Secondary | ICD-10-CM | POA: Diagnosis not present

## 2024-10-07 HISTORY — PX: LAPAROSCOPIC RIGHT COLECTOMY: SHX5925

## 2024-10-07 LAB — POCT I-STAT 7, (LYTES, BLD GAS, ICA,H+H)
Acid-base deficit: 2 mmol/L (ref 0.0–2.0)
Bicarbonate: 23.2 mmol/L (ref 20.0–28.0)
Calcium, Ion: 1.61 mmol/L (ref 1.15–1.40)
HCT: 29 % — ABNORMAL LOW (ref 36.0–46.0)
Hemoglobin: 9.9 g/dL — ABNORMAL LOW (ref 12.0–15.0)
O2 Saturation: 100 %
Potassium: 3.8 mmol/L (ref 3.5–5.1)
Sodium: 140 mmol/L (ref 135–145)
TCO2: 24 mmol/L (ref 22–32)
pCO2 arterial: 38.4 mmHg (ref 32–48)
pH, Arterial: 7.39 (ref 7.35–7.45)
pO2, Arterial: 178 mmHg — ABNORMAL HIGH (ref 83–108)

## 2024-10-07 LAB — BASIC METABOLIC PANEL WITH GFR
Anion gap: 9 (ref 5–15)
BUN: 11 mg/dL (ref 8–23)
CO2: 24 mmol/L (ref 22–32)
Calcium: 11.9 mg/dL — ABNORMAL HIGH (ref 8.9–10.3)
Chloride: 108 mmol/L (ref 98–111)
Creatinine, Ser: 1.47 mg/dL — ABNORMAL HIGH (ref 0.44–1.00)
GFR, Estimated: 36 mL/min — ABNORMAL LOW (ref >60)
Glucose, Bld: 86 mg/dL (ref 70–99)
Potassium: 4.4 mmol/L (ref 3.5–5.1)
Sodium: 140 mmol/L (ref 135–145)

## 2024-10-07 LAB — CBC
HCT: 36 % (ref 36.0–46.0)
Hemoglobin: 9.6 g/dL — ABNORMAL LOW (ref 12.0–15.0)
MCH: 22.3 pg — ABNORMAL LOW (ref 26.0–34.0)
MCHC: 26.7 g/dL — ABNORMAL LOW (ref 30.0–36.0)
MCV: 83.5 fL (ref 80.0–100.0)
Platelets: 285 K/uL (ref 150–400)
RBC: 4.31 MIL/uL (ref 3.87–5.11)
RDW: 24 % — ABNORMAL HIGH (ref 11.5–15.5)
WBC: 8.1 K/uL (ref 4.0–10.5)
nRBC: 0 % (ref 0.0–0.2)

## 2024-10-07 LAB — TYPE AND SCREEN
ABO/RH(D): A POS
ABO/RH(D): A POS
Antibody Screen: NEGATIVE
Antibody Screen: NEGATIVE

## 2024-10-07 LAB — MAGNESIUM: Magnesium: 1.8 mg/dL (ref 1.7–2.4)

## 2024-10-07 SURGERY — PARATHYROIDECTOMY
Anesthesia: General | Laterality: Right

## 2024-10-07 SURGERY — COLECTOMY, RIGHT, LAPAROSCOPIC
Anesthesia: General

## 2024-10-07 MED ORDER — OXYCODONE HCL 5 MG PO TABS: 5.0000 mg | ORAL_TABLET | Freq: Once | ORAL | Status: DC | PRN

## 2024-10-07 MED ORDER — ROCURONIUM BROMIDE 10 MG/ML (PF) SYRINGE
PREFILLED_SYRINGE | INTRAVENOUS | Status: AC
  Filled 2024-10-07: qty 10

## 2024-10-07 MED ORDER — ENSURE PRE-SURGERY PO LIQD: 296.0000 mL | Freq: Once | ORAL | Status: DC

## 2024-10-07 MED ORDER — HYDROMORPHONE HCL 1 MG/ML IJ SOLN: 0.2500 mg | INTRAMUSCULAR | Status: DC | PRN

## 2024-10-07 MED ORDER — SUGAMMADEX SODIUM 200 MG/2ML IV SOLN
INTRAVENOUS | Status: DC | PRN
  Administered 2024-10-07: 400 mg via INTRAVENOUS

## 2024-10-07 MED ORDER — STERILE WATER FOR IRRIGATION IR SOLN
Status: DC | PRN
  Administered 2024-10-07: 1000 mL

## 2024-10-07 MED ORDER — POLYETHYLENE GLYCOL 3350 17 GM/SCOOP PO POWD
238.0000 g | Freq: Once | ORAL | Status: DC
  Filled 2024-10-07: qty 238

## 2024-10-07 MED ORDER — PROPOFOL 10 MG/ML IV BOLUS
INTRAVENOUS | Status: DC | PRN
Start: 2024-10-07 — End: 2024-10-07
  Administered 2024-10-07: 150 mg via INTRAVENOUS

## 2024-10-07 MED ORDER — BUPIVACAINE-EPINEPHRINE 0.25% -1:200000 IJ SOLN
INTRAMUSCULAR | Status: DC | PRN
  Administered 2024-10-07: 30 mL

## 2024-10-07 MED ORDER — FENTANYL CITRATE (PF) 250 MCG/5ML IJ SOLN
INTRAMUSCULAR | Status: AC
  Filled 2024-10-07: qty 5

## 2024-10-07 MED ORDER — 0.9 % SODIUM CHLORIDE (POUR BTL) OPTIME
TOPICAL | Status: DC | PRN
  Administered 2024-10-07: 2000 mL

## 2024-10-07 MED ORDER — NEOMYCIN SULFATE 500 MG PO TABS: 1000.0000 mg | ORAL_TABLET | ORAL | Status: AC

## 2024-10-07 MED ORDER — SUGAMMADEX SODIUM 200 MG/2ML IV SOLN
INTRAVENOUS | Status: AC
  Filled 2024-10-07: qty 2

## 2024-10-07 MED ORDER — ENSURE PRE-SURGERY PO LIQD
592.0000 mL | Freq: Once | ORAL | Status: DC
  Filled 2024-10-07: qty 592

## 2024-10-07 MED ORDER — ONDANSETRON HCL 4 MG/2ML IJ SOLN: 4.0000 mg | Freq: Once | INTRAMUSCULAR | Status: DC | PRN

## 2024-10-07 MED ORDER — BUPIVACAINE-EPINEPHRINE (PF) 0.25% -1:200000 IJ SOLN
INTRAMUSCULAR | Status: AC
  Filled 2024-10-07: qty 30

## 2024-10-07 MED ORDER — HYDROMORPHONE HCL 1 MG/ML IJ SOLN
0.5000 mg | INTRAMUSCULAR | Status: DC | PRN
  Administered 2024-10-07: 0.5 mg via INTRAVENOUS
  Filled 2024-10-07: qty 0.5

## 2024-10-07 MED ORDER — METHOCARBAMOL 1000 MG/10ML IJ SOLN: 500.0000 mg | Freq: Four times a day (QID) | INTRAMUSCULAR | Status: DC | PRN

## 2024-10-07 MED ORDER — KETAMINE HCL 50 MG/5ML IJ SOSY
PREFILLED_SYRINGE | INTRAMUSCULAR | Status: DC | PRN
  Administered 2024-10-07: 10 mg via INTRAVENOUS

## 2024-10-07 MED ORDER — KETAMINE HCL 50 MG/5ML IJ SOSY
PREFILLED_SYRINGE | INTRAMUSCULAR | Status: AC
  Filled 2024-10-07: qty 5

## 2024-10-07 MED ORDER — FENTANYL CITRATE (PF) 250 MCG/5ML IJ SOLN
INTRAMUSCULAR | Status: DC | PRN
  Administered 2024-10-07 (×2): 25 ug via INTRAVENOUS
  Administered 2024-10-07: 50 ug via INTRAVENOUS
  Administered 2024-10-07: 100 ug via INTRAVENOUS
  Administered 2024-10-07 (×2): 25 ug via INTRAVENOUS

## 2024-10-07 MED ORDER — PHENYLEPHRINE HCL-NACL 20-0.9 MG/250ML-% IV SOLN
INTRAVENOUS | Status: DC | PRN
  Administered 2024-10-07: 30 ug/min via INTRAVENOUS

## 2024-10-07 MED ORDER — METRONIDAZOLE 500 MG PO TABS: 1000.0000 mg | ORAL_TABLET | ORAL | Status: AC

## 2024-10-07 MED ORDER — PROCHLORPERAZINE EDISYLATE 10 MG/2ML IJ SOLN: 10.0000 mg | INTRAMUSCULAR | Status: DC | PRN

## 2024-10-07 MED ORDER — OXYCODONE HCL 5 MG PO TABS
5.0000 mg | ORAL_TABLET | ORAL | Status: DC | PRN
  Administered 2024-10-07: 5 mg via ORAL
  Filled 2024-10-07: qty 1

## 2024-10-07 MED ORDER — OXYCODONE HCL 5 MG/5ML PO SOLN: 5.0000 mg | Freq: Once | ORAL | Status: DC | PRN

## 2024-10-07 MED ORDER — ACETAMINOPHEN 500 MG PO TABS
1000.0000 mg | ORAL_TABLET | ORAL | Status: AC
  Administered 2024-10-07: 1000 mg via ORAL
  Filled 2024-10-07: qty 2

## 2024-10-07 MED ORDER — PHENYLEPHRINE HCL (PRESSORS) 10 MG/ML IV SOLN
INTRAVENOUS | Status: DC | PRN
  Administered 2024-10-07: 240 ug via INTRAVENOUS
  Administered 2024-10-07: 120 ug via INTRAVENOUS

## 2024-10-07 MED ORDER — SODIUM CHLORIDE 0.9 % IV SOLN
2.0000 g | INTRAVENOUS | Status: AC
  Administered 2024-10-07: 2 g via INTRAVENOUS
  Filled 2024-10-07: qty 2

## 2024-10-07 MED ORDER — ALBUMIN HUMAN 5 % IV SOLN
INTRAVENOUS | Status: AC
  Filled 2024-10-07: qty 250

## 2024-10-07 MED ORDER — ACETAMINOPHEN 325 MG PO TABS
650.0000 mg | ORAL_TABLET | Freq: Four times a day (QID) | ORAL | Status: DC
Start: 2024-10-07 — End: 2024-10-08
  Administered 2024-10-07 – 2024-10-08 (×4): 650 mg via ORAL
  Filled 2024-10-07 (×4): qty 2

## 2024-10-07 MED ORDER — OXYCODONE HCL 5 MG PO TABS: 10.0000 mg | ORAL_TABLET | ORAL | Status: DC | PRN

## 2024-10-07 MED ORDER — LIDOCAINE HCL (PF) 2 % IJ SOLN
INTRAMUSCULAR | Status: DC | PRN
  Administered 2024-10-07: 60 mg via INTRADERMAL

## 2024-10-07 MED ORDER — LIDOCAINE HCL (PF) 2 % IJ SOLN
INTRAMUSCULAR | Status: AC
  Filled 2024-10-07: qty 5

## 2024-10-07 MED ORDER — SIMETHICONE 80 MG PO CHEW
80.0000 mg | CHEWABLE_TABLET | Freq: Four times a day (QID) | ORAL | Status: DC | PRN
Start: 2024-10-07 — End: 2024-10-10

## 2024-10-07 MED ORDER — SODIUM CHLORIDE 0.9 % IV SOLN
INTRAVENOUS | Status: DC | PRN
Start: 2024-10-07 — End: 2024-10-07

## 2024-10-07 MED ORDER — SCOPOLAMINE 1 MG/3DAYS TD PT72
1.0000 | MEDICATED_PATCH | TRANSDERMAL | Status: DC
  Administered 2024-10-07: 1 mg via TRANSDERMAL
  Filled 2024-10-07: qty 1

## 2024-10-07 MED ORDER — LACTATED RINGERS IV SOLN: INTRAVENOUS | Status: DC

## 2024-10-07 MED ORDER — ALBUMIN HUMAN 5 % IV SOLN: INTRAVENOUS | Status: DC | PRN

## 2024-10-07 MED ORDER — CHLORHEXIDINE GLUCONATE 0.12 % MT SOLN
15.0000 mL | Freq: Once | OROMUCOSAL | Status: AC
  Administered 2024-10-07: 15 mL via OROMUCOSAL

## 2024-10-07 MED ORDER — ALVIMOPAN 12 MG PO CAPS
12.0000 mg | ORAL_CAPSULE | ORAL | Status: AC
  Administered 2024-10-07: 12 mg via ORAL
  Filled 2024-10-07: qty 1

## 2024-10-07 MED ORDER — AMISULPRIDE (ANTIEMETIC) 5 MG/2ML IV SOLN: 10.0000 mg | Freq: Once | INTRAVENOUS | Status: DC | PRN

## 2024-10-07 MED ORDER — ONDANSETRON HCL 4 MG/2ML IJ SOLN
INTRAMUSCULAR | Status: DC | PRN
  Administered 2024-10-07: 4 mg via INTRAVENOUS

## 2024-10-07 MED ORDER — PROPOFOL 10 MG/ML IV BOLUS
INTRAVENOUS | Status: AC
  Filled 2024-10-07: qty 20

## 2024-10-07 MED ORDER — ROCURONIUM BROMIDE 10 MG/ML (PF) SYRINGE
PREFILLED_SYRINGE | INTRAVENOUS | Status: DC | PRN
Start: 2024-10-07 — End: 2024-10-07
  Administered 2024-10-07: 100 mg via INTRAVENOUS

## 2024-10-07 MED ORDER — DOCUSATE SODIUM 100 MG PO CAPS
100.0000 mg | ORAL_CAPSULE | Freq: Two times a day (BID) | ORAL | Status: DC
  Administered 2024-10-07 – 2024-10-09 (×5): 100 mg via ORAL
  Filled 2024-10-07 (×6): qty 1

## 2024-10-07 SURGICAL SUPPLY — 52 items
BAG COUNTER SPONGE SURGICOUNT (BAG) IMPLANT
BLADE EXTENDED COATED 6.5IN (ELECTRODE) IMPLANT
CHLORAPREP W/TINT 26 (MISCELLANEOUS) ×2 IMPLANT
CLIP APPLIE 5 13 M/L LIGAMAX5 (MISCELLANEOUS) IMPLANT
CLIP APPLIE ROT 10 11.4 M/L (STAPLE) IMPLANT
CLIP LIGATING HEM O LOK PURPLE (MISCELLANEOUS) IMPLANT
DERMABOND ADVANCED .7 DNX6 (GAUZE/BANDAGES/DRESSINGS) IMPLANT
DRAIN CHANNEL 19F RND (DRAIN) IMPLANT
DRSG OPSITE POSTOP 4X6 (GAUZE/BANDAGES/DRESSINGS) IMPLANT
DRSG OPSITE POSTOP 4X8 (GAUZE/BANDAGES/DRESSINGS) IMPLANT
ELECT PENCIL ROCKER SW 15FT (MISCELLANEOUS) ×2 IMPLANT
ELECT REM PT RETURN 15FT ADLT (MISCELLANEOUS) ×2 IMPLANT
EVACUATOR SILICONE 100CC (DRAIN) IMPLANT
GAUZE SPONGE 4X4 12PLY STRL (GAUZE/BANDAGES/DRESSINGS) IMPLANT
GLOVE BIO SURGEON STRL SZ7.5 (GLOVE) ×4 IMPLANT
GLOVE INDICATOR 8.0 STRL GRN (GLOVE) ×4 IMPLANT
GOWN STRL REUS W/ TWL XL LVL3 (GOWN DISPOSABLE) ×4 IMPLANT
IRRIGATION SUCT STRKRFLW 2 WTP (MISCELLANEOUS) ×2 IMPLANT
KIT SIGMOIDOSCOPE (SET/KITS/TRAYS/PACK) IMPLANT
KIT TURNOVER KIT A (KITS) ×2 IMPLANT
LIGASURE BLUNT TIP 5 LONG 44CM (ELECTROSURGICAL) ×2 IMPLANT
LIGASURE LAP MARYLAND 5MM 37CM (ELECTROSURGICAL) IMPLANT
NDL INSUFFLATION 14GA 120MM (NEEDLE) IMPLANT
NEEDLE INSUFFLATION 14GA 120MM (NEEDLE) IMPLANT
PACK COLON (CUSTOM PROCEDURE TRAY) ×2 IMPLANT
PAD POSITIONING PINK XL (MISCELLANEOUS) IMPLANT
PROTECTOR NERVE ULNAR (MISCELLANEOUS) IMPLANT
RELOAD STAPLE 60 2.6 WHT THN (STAPLE) IMPLANT
RELOAD STAPLE 60 3.6 BLU REG (STAPLE) IMPLANT
RETRACTOR WND ALEXIS 18 MED (MISCELLANEOUS) IMPLANT
SET TUBE SMOKE EVAC HIGH FLOW (TUBING) ×2 IMPLANT
SLEEVE Z-THREAD 5X100MM (TROCAR) ×4 IMPLANT
STAPLER ECHELON LONG 60 440 (INSTRUMENTS) IMPLANT
STAPLER ECHELON POWER CIR 29 (STAPLE) IMPLANT
STAPLER ECHELON POWER CIR 31 (STAPLE) IMPLANT
STAPLER SKIN PROX 35W (STAPLE) IMPLANT
SUT ETHILON 2 0 PS N (SUTURE) IMPLANT
SUT MNCRL AB 4-0 PS2 18 (SUTURE) ×2 IMPLANT
SUT PDS AB 0 CT1 36 (SUTURE) ×4 IMPLANT
SUT PROLENE 2 0 KS (SUTURE) IMPLANT
SUT SILK 2 0 SH CR/8 (SUTURE) ×2 IMPLANT
SUT SILK 2-0 18XBRD TIE 12 (SUTURE) ×2 IMPLANT
SUT SILK 3 0 SH CR/8 (SUTURE) ×2 IMPLANT
SUT SILK 3-0 18XBRD TIE 12 (SUTURE) IMPLANT
SUT VIC AB 2-0 SH 18 (SUTURE) ×2 IMPLANT
SUT VIC AB 2-0 SH 27X BRD (SUTURE) ×2 IMPLANT
SUT VICRYL 0 UR6 27IN ABS (SUTURE) ×2 IMPLANT
SYSTEM LAPSCP GELPORT 120MM (MISCELLANEOUS) IMPLANT
SYSTEM WOUND ALEXIS 18CM MED (MISCELLANEOUS) IMPLANT
TRAY FOLEY MTR SLVR 16FR STAT (SET/KITS/TRAYS/PACK) IMPLANT
TROCAR ADV FIXATION 12X100MM (TROCAR) ×2 IMPLANT
TUBING CONNECTING 10 (TUBING) ×4 IMPLANT

## 2024-10-07 NOTE — Anesthesia Preprocedure Evaluation (Addendum)
 Anesthesia Evaluation  Patient identified by MRN, date of birth, ID band Patient awake    Reviewed: Allergy & Precautions, NPO status , Patient's Chart, lab work & pertinent test results, reviewed documented beta blocker date and time   History of Anesthesia Complications Negative for: history of anesthetic complications  Airway Mallampati: III  TM Distance: >3 FB Neck ROM: Full    Dental  (+) Teeth Intact, Dental Advisory Given   Pulmonary sleep apnea    Pulmonary exam normal breath sounds clear to auscultation       Cardiovascular hypertension, Pt. on medications and Pt. on home beta blockers +CHF (LVEF 30-35%, grade 3 diastolic dysfunction)  Normal cardiovascular exam+ dysrhythmias (eliquis ) Atrial Fibrillation + Valvular Problems/Murmurs (mild MR) MR  Rhythm:Regular Rate:Normal  Echo 10/02/24:  1. Left ventricular ejection fraction, by estimation, is 30 to 35%. The  left ventricle has moderately decreased function. The left ventricle  demonstrates global hypokinesis. There is mild left ventricular  hypertrophy. Left ventricular diastolic  parameters are consistent with Grade III diastolic dysfunction  (restrictive).   2. Right ventricular systolic function is normal. The right ventricular  size is normal.   3. Left atrial size was severely dilated.   4. The mitral valve is normal in structure. Mild mitral valve  regurgitation. No evidence of mitral stenosis.   5. The aortic valve is tricuspid. Aortic valve regurgitation is trivial.  No aortic stenosis is present.   6. The inferior vena cava is normal in size with greater than 50%  respiratory variability, suggesting right atrial pressure of 3 mmHg.    Being followed by cards- thought to be euvolemic at this time, on amio for Afib but A/C being held for surgery. Has been having asymptomatic pauses <5s   Neuro/Psych negative neurological ROS  negative psych ROS    GI/Hepatic Neg liver ROS,GERD  Controlled,,New CRC    Endo/Other    Class 3 obesity (BMI 40)Scheduled for parathyroidectomy on 10/20 when outpatient labs revealed anemia- sent in for GI w/u, given 1 unit prbcs, has never had cscope  Obesity BMI 39  Renal/GU Renal InsufficiencyRenal disease (cr 1.42)  negative genitourinary   Musculoskeletal  (+) Arthritis , Osteoarthritis,    Abdominal  (+) + obese  Peds  Hematology negative hematology ROS (+) Hb 9.3 (7.1 on admission, now s/p 1 unit prbc)   Anesthesia Other Findings   Reproductive/Obstetrics negative OB ROS                              Anesthesia Physical Anesthesia Plan  ASA: 4  Anesthesia Plan: General   Post-op Pain Management: Ofirmev  IV (intra-op)* and Ketamine IV*   Induction: Intravenous  PONV Risk Score and Plan: 4 or greater and Treatment may vary due to age or medical condition, Ondansetron  and Dexamethasone  Airway Management Planned: Oral ETT  Additional Equipment: Arterial line  Intra-op Plan:   Post-operative Plan: Extubation in OR  Informed Consent: I have reviewed the patients History and Physical, chart, labs and discussed the procedure including the risks, benefits and alternatives for the proposed anesthesia with the patient or authorized representative who has indicated his/her understanding and acceptance.     Dental advisory given  Plan Discussed with: CRNA  Anesthesia Plan Comments: (Post-induction arterial line for monitoring, gases Active type and screen Current access 22G PIV x 1- will need 2 large bore PIVs)         Anesthesia Quick  Evaluation

## 2024-10-07 NOTE — Anesthesia Procedure Notes (Addendum)
 Procedure Name: Intubation Date/Time: 10/07/2024 7:40 AM  Performed by: Augusta Daved SAILOR, CRNAPre-anesthesia Checklist: Patient identified, Emergency Drugs available, Suction available, Patient being monitored and Timeout performed Patient Re-evaluated:Patient Re-evaluated prior to induction Oxygen Delivery Method: Ambu bag Preoxygenation: Pre-oxygenation with 100% oxygen Induction Type: IV induction, Rapid sequence and Cricoid Pressure applied Ventilation: Mask ventilation without difficulty and Oral airway inserted - appropriate to patient size Laryngoscope Size: Glidescope and 3 Grade View: Grade I Tube type: Subglottic suction tube Tube size: 7.0 mm Number of attempts: 1 Airway Equipment and Method: Stylet and Video-laryngoscopy Placement Confirmation: ETT inserted through vocal cords under direct vision, breath sounds checked- equal and bilateral and CO2 detector Secured at: 21 cm Tube secured with: Tape Dental Injury: Teeth and Oropharynx as per pre-operative assessment  Comments: Intubation by Recardo Ahle, MD, with glidescope

## 2024-10-07 NOTE — Anesthesia Postprocedure Evaluation (Signed)
 Anesthesia Post Note  Patient: Kelli Webster  Procedure(s) Performed: COLECTOMY, RIGHT, LAPAROSCOPIC     Patient location during evaluation: PACU Anesthesia Type: General Level of consciousness: awake and alert, oriented and patient cooperative Pain management: pain level controlled Vital Signs Assessment: post-procedure vital signs reviewed and stable Respiratory status: spontaneous breathing, nonlabored ventilation and respiratory function stable Cardiovascular status: blood pressure returned to baseline and stable Postop Assessment: no apparent nausea or vomiting Anesthetic complications: no   No notable events documented.  Last Vitals:  Vitals:   10/07/24 1125 10/07/24 1129  BP: (!) 130/54 (!) 130/54  Pulse: 66 61  Resp:  18  Temp: (!) 36.3 C (!) 36.3 C  SpO2: 96% 100%                  [name]

## 2024-10-07 NOTE — Transfer of Care (Signed)
 Immediate Anesthesia Transfer of Care Note  Patient: Kelli Webster  Procedure(s) Performed: COLECTOMY, RIGHT, LAPAROSCOPIC  Patient Location: PACU  Anesthesia Type:General  Level of Consciousness: awake and patient cooperative  Airway & Oxygen Therapy: Patient Spontanous Breathing and Patient connected to face mask oxygen  Post-op Assessment: Report given to RN and Post -op Vital signs reviewed and stable  Post vital signs: Reviewed and stable  Last Vitals:  Vitals Value Taken Time  BP 180/67 10/07/24 10:15  Temp    Pulse 61 10/07/24 10:17  Resp 14 10/07/24 10:17  SpO2 96 % 10/07/24 10:17  Vitals shown include unfiled device data.  Last Pain:  Vitals:   10/07/24 0712  TempSrc:   PainSc: 0-No pain         Complications: No notable events documented.

## 2024-10-07 NOTE — Op Note (Signed)
 Patient: Kelli Webster (04-Oct-1946, [id number])  Date of Surgery: 10/07/2024  Preoperative Diagnosis: Bleeding hepatic flexure colon mass  Postoperative Diagnosis: Bleeding hepatic flexure colon mass   Surgical Procedure: Laparoscopic right colectomy   Operative Team Members:  Surgeons and Role:    * Ruhan Borak, Deward PARAS, MD - Primary   Anesthesiologist: Merla Almarie HERO, DO CRNA: Augusta Daved SAILOR, CRNA; Rudolpho Fonda SAUNDERS, CRNA   Anesthesia: Choice   Fluids:  Total I/O In: 1750 [I.V.:1150; IV Piggyback:600] Out: 120 [Urine:20; Blood:100]  Complications: None  Drains:  none   Specimen:  ID Type Source Tests Collected by Time Destination  1 : Right colon Tissue PATH GI Other SURGICAL PATHOLOGY Jamilya Sarrazin, Deward PARAS, MD 10/07/2024 [phone]      Disposition:  PACU - hemodynamically stable.  Plan of Care: Continue inpatient care    Indications for Procedure: Kelli Webster is a 78 y.o. 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  It appears this was bleeding and the source of her anemia.  Laparoscopic right colectomy was recommended for the patient.  The procedure itself as well as its risks, benefits and alternatives were discussed.  The risks discussed included but were not limited to the risk of infection, bleeding, damage to nearby structures, and anastomotic leak.  After a full discussion and all questions answered the patient granted consent to proceed.  Findings: Tattoo in the hepatic flexure   Description of Procedure:   On the date stated above the patient is taken operating room suite and placed in supine position.  General endotracheal anesthesia was induced.  A timeout was completed verifying the correct patient, procedure, position, and equipment needed for the case.  Patient's abdomen was prepped and draped in usual sterile fashion.  I made a midline laparotomy incision about  the with the my hand right the level of the umbilicus and entered the abdomen without any trauma and underlying viscera.  She had a small umbilical hernia that was incorporated into this incision.  The abdomen was inflated to 15 mmHg using the gel HandPort.  A trocar was placed through here and 2 additional 5 mm trocars were placed in the right lower quadrant.  The patient was placed in the left side down positioning and the right colon was inspected.  The tattoo was identified in the area of the hepatic flexure.  I divided the omentum of the proximal transverse colon and ensured I would have enough length of transverse colon before the middle colic vessels to perform a right colectomy and adequately resect the specimen.  This appeared appropriate so I proceeded with right colectomy.  The cecum was grasped and elevated and I inspected the mesentery.  I was able to identify the right colic artery and the mesentery.  I dissected the right colic artery and vein away from their attachments and the mesocolon.  I used the [place]  bipolar LigaSure.  They were both isolated and clipped.  They were then divided.  I then divided the mid proximal transverse colon using a blue load of the endoscopic linear stapler.  I was able to divide the transverse colon mesentery down to the divided right colic artery using the LigaSure.  The terminal ileum was elevated and divided using a white load of the endoscopic linear stapler.  The mesentery was also divided up to the divided right colic artery.  The colon was then mobilized out of the retroperitoneum by  dividing the white line of Toldt laterally, dividing all attachments tween the colon and the liver and foregut structures superiorly.  Care was taken to avoid injury to the right ureter or the duodenum.  I was able to fully divide all attachments of the right colon and remove it through the midline laparotomy incision.  I then delivered the divided transverse colon and divided  terminal ileum through the midline incision and created a stapled anastomosis in the typical fashion.  The mesenteric border of the ileum and the tinea of the colon were approximated using Vicryl suture.  Enterotomies were created in the antimesenteric border and along the tinea of the colon a few centimeters from the divided ends of the bowel.  A white load of the endoscopic linear stapler was inserted and fired to create the anastomosis between the 2 limbs of intestine.  The common enterotomy was closed using interrupted 2-0 Vicryl suture.  The closure was imbricated using additional 2-0 Vicryl suture and tacking the omentum over the closure.  The apex of the anastomosis was reinforced using a 2-0 Vicryl suture.  The anastomosis was inspected and appeared patent with good hemostasis was inserted back into the abdomen and the GelPort was replaced.  The abdomen was inspected for hemostasis which appeared adequate so we directed our attention to closure.    Clean closure was performed.  The patient was redraped.  We changed our gown and gloves.  We used new instruments.  The midline fascia was closed using running 0 PDS suture.  The subcutaneous tissues were irrigated.  The skin was reapproximated with 2-0 Vicryl and Monocryl.  Dermabond was applied.  All sponge needle counts were correct at the end of this case.   At the end of the case we reviewed the infection status of the case. Patient: Kelli Webster Emergency General Surgery Service Patient Case: Urgent Infection Present At Time Of Surgery (PATOS): Contamination related to performing a enterocolic anastomosis  Deward Foy, MD General, Bariatric, & Minimally Invasive Surgery [facility], [place]

## 2024-10-07 NOTE — Progress Notes (Signed)
 Patient from OR with aline present, DCed per Dr Neita request;Site with bruising noted ,dressing with gauze applied

## 2024-10-07 NOTE — Addendum Note (Signed)
 Addendum  created 10/07/24 1155 by Augusta Daved SAILOR, CRNA   Intraprocedure Meds edited

## 2024-10-07 NOTE — Anesthesia Procedure Notes (Signed)
 Arterial Line Insertion Start/End10/20/2025 7:50 AM, 10/07/2024 7:55 AM Performed by: Merla Almarie HERO, DO, anesthesiologist  Patient location: Pre-op. Preanesthetic checklist: patient identified, IV checked, site marked, risks and benefits discussed, surgical consent, monitors and equipment checked, pre-op evaluation, timeout performed and anesthesia consent Lidocaine  1% used for infiltration Right, radial was placed Catheter size: 20 G Hand hygiene performed  and maximum sterile barriers used   Attempts: 1 Following insertion, dressing applied. Post procedure assessment: normal and unchanged  Patient tolerated the procedure well with no immediate complications.

## 2024-10-07 NOTE — Progress Notes (Signed)
 Progress Note: General Surgery Service   Chief Complaint/Subjective: Patient in preop  Objective: Vital signs in last 24 hours: Temp:  [97.7 F (36.5 C)-98 F (36.7 C)] 97.9 F (36.6 C) (10/20 0517) Pulse Rate:  [60-85] 73 (10/20 0517) Resp:  [18] 18 (10/20 0517) BP: (121-147)/(63-70) 147/70 (10/20 0517) SpO2:  [97 %-100 %] 97 % (10/20 0517) Weight:  [103.4 kg] 103.4 kg (10/20 0712) Last BM Date : 10/05/24  Intake/Output from previous day: 10/19 0701 - 10/20 0700 In: 1490 [P.O.:1490] Out: -  Intake/Output this shift: No intake/output data recorded.  Constitutional: NAD; conversant; no deformities Eyes: Moist conjunctiva; no lid lag; anicteric; PERRL Neck: Trachea midline; no thyromegaly Lungs: Normal respiratory effort; no tactile fremitus CV: RRR; no palpable thrills; no pitting edema GI: Abd soft, nontender; no palpable hepatosplenomegaly MSK: Normal range of motion of extremities; no clubbing/cyanosis Psychiatric: Appropriate affect; alert and oriented x3 Lymphatic: No palpable cervical or axillary lymphadenopathy  Lab Results: CBC  Recent Labs    10/04/24 1108 10/06/24 0555  WBC 7.5 5.8  HGB 9.9* 9.3*  HCT 34.3* 32.3*  PLT 452* 363   BMET Recent Labs    10/06/24 0555 10/07/24 0444  NA 142 140  K 4.0 4.4  CL 109 108  CO2 24 24  GLUCOSE 82 86  BUN 12 11  CREATININE 1.42* 1.47*  CALCIUM 11.8* 11.9*   PT/INR No results for input(s): LABPROT, INR in the last 72 hours. ABG No results for input(s): PHART, HCO3 in the last 72 hours.  Invalid input(s): PCO2, PO2  Anti-infectives: Anti-infectives (From admission, onward)    Start     Dose/Rate Route Frequency Ordered Stop   10/07/24 0600  cefoTEtan (CEFOTAN) 2 g in sodium chloride  0.9 % 100 mL IVPB        2 g 200 mL/hr over 30 Minutes Intravenous On call to O.R. 10/07/24 0015 10/08/24 0559   10/04/24 1400  neomycin (MYCIFRADIN) tablet 1,000 mg       Placed in And Linked Group   1,000  mg Oral 3 times per day 10/07/24 0015 10/05/24 1359   10/04/24 1400  metroNIDAZOLE (FLAGYL) tablet 1,000 mg       Placed in And Linked Group   1,000 mg Oral 3 times per day 10/07/24 0015 10/05/24 1359       Medications: Scheduled Meds:  [MAR Hold] amiodarone   200 mg Oral q AM   [START ON 10/08/2024] feeding supplement  296 mL Oral Once   feeding supplement  592 mL Oral Once   [MAR Hold] pantoprazole  (PROTONIX ) IV  40 mg Intravenous Q12H   polyethylene glycol powder  238 g Oral Once   scopolamine  1 patch Transdermal On Call to OR   Continuous Infusions:  cefoTEtan (CEFOTAN) IV     lactated ringers  10 mL/hr at 10/07/24 0710   PRN Meds:.[MAR Hold] acetaminophen  **OR** [MAR Hold] acetaminophen , [MAR Hold] ondansetron  **OR** [MAR Hold] ondansetron  (ZOFRAN ) IV, [MAR Hold] mouth rinse, [MAR Hold] senna-docusate  Assessment/Plan: Kelli Webster is a 78 year old female with a bleeding hepatic flexure tubular adenoma.  I recommended laparoscopic right colectomy.  We discussed the procedure, its risks, benefits and alternatives and the patient granted consent to proceed.  Will proceed as scheduled.   LOS: 6 days   Deward JINNY Foy, MD  [facility] Use [email or web address] to contact on call provider  Daily Billing: [phone] - Straightforward / Low MDM

## 2024-10-07 NOTE — Plan of Care (Signed)

## 2024-10-07 NOTE — Progress Notes (Signed)
 PROGRESS NOTE    Kelli Webster  FMW:[id number] DOB: May 03, 1946 DOA: 10/01/2024 PCP: [facility]    Brief Narrative:  Patient with PMH of Chronic combined CHF, Eliquis  for A-fib, CKD 3B, OSA, HTN, primary hyperparathyroidism with recently scheduled outpatient surgery, obesity Presented to the hospital with complaints of abnormal lab. Received 1 PRBC transfusion.  Underwent EGD and colonoscopy.  Found to have hepatic flexure mass. Underwent colectomy and anastomosis today.  Subjective: Patient seen and examined.  Came back from procedure.  She has mild soreness but denies any obvious nausea vomiting or severe abdominal pain.  She was very happy to have getting good care. Telemetry monitor shows A-fib with bradycardia.  Occasional pauses of 2 seconds that were recorded in previous cardiology notes.  Blood pressure stable.  Assessment & Plan:   Symptomatic blood loss anemia. Status post EGD colonoscopy on 10/16. Found to have Colonic mass. Severe gastritis. Multiple colonic polyps. Likely chronic in nature. Patient never had a colonoscopy. Hemoglobin was 7.1 upon admission, improved to 9, baseline appears to be around 11 for the patient. Iron level was low which was replaced. Continue with PPI EGD showed severe gastritis and colonoscopy shows hepatic mass. CT chest abdomen pelvis shows hepatic flexure mass as well as some pulmonary nodules. CEA normal. Status post colectomy and anastomosis today.   Paroxysmal A-fib. On anticoagulation with Eliquis . Currently appears to be rate controlled.  Occasional bradycardia.  Will hold Coreg  today. Continue amiodarone  and Coreg . Hold anticoagulation for now , heparin  post surgery when cleared by surgery.   Primary hyperparathyroidism. Mild hypercalcemia. Currently scheduled for outpatient procedure.  Surgery will be now postponed until patient recovers from colectomy. Will monitor.   Chronic combined  systolic and diastolic CHF. Echocardiogram shows EF of 40 to 45% with grade 1 diastolic dysfunction. Does not appear to be volume overloaded. Repeat echocardiogram shows worsening EF.  Appreciate cardio consultation.  No further workup right now. Will monitor.      DVT prophylaxis: SCDs Start: 10/01/24 1850   Code Status: Full code Family Communication: None at the bedside Disposition Plan: Status is: Inpatient Remains inpatient appropriate because: Immediate postop.     Consultants:  Cardiology General surgery  Procedures:  None  Antimicrobials:  None     Objective: Vitals:   10/07/24 1045 10/07/24 1100 10/07/24 1125 10/07/24 1129  BP: (!) 180/73  (!) 130/54 (!) 130/54  Pulse: 61  66 61  Resp: 15 14  18   Temp:  (!) 97 F (36.1 C) (!) 97.4 F (36.3 C) (!) 97.4 F (36.3 C)  TempSrc:   Axillary Axillary  SpO2: 93%  96% 100%  Weight:      Height:        Intake/Output Summary (Last 24 hours) at 10/07/2024 1305 Last data filed at 10/07/2024 1010 Gross per 24 hour  Intake 2780 ml  Output 120 ml  Net 2660 ml   Filed Weights   10/05/24 0500 10/06/24 0500 10/07/24 9287  Weight: 102.6 kg 103.4 kg 103.4 kg    Examination:  General: Looks fairly comfortable on room air.  Alert awake interactive.  Pleasant. Cardiovascular: S1-S2 normal.  Irregularly irregular. Respiratory: Bilateral clear.  No added sounds. Gastrointestinal: Soft.  Mildly tender midline incision intact.  Bowel sound present. Ext: No swelling or edema.    Data Reviewed: I have personally reviewed following labs and imaging studies  CBC: Recent Labs  Lab 10/02/24 1731 10/03/24 0545 10/04/24 1108 10/06/24 0555 10/07/24 0815 10/07/24 1055  WBC 8.1 8.0 7.5 5.8  --  8.1  NEUTROABS 5.3 5.5  --   --   --   --   HGB 9.8* 10.0* 9.9* 9.3* 9.9* 9.6*  HCT 33.2* 35.9* 34.3* 32.3* 29.0* 36.0  MCV 76.5* 76.5* 78.1* 79.6*  --  83.5  PLT 445* 506* 452* 363  --  285   Basic Metabolic  Panel: Recent Labs  Lab 10/02/24 0349 10/03/24 0545 10/04/24 1108 10/06/24 0555 10/07/24 0444 10/07/24 0815  NA 140 140 137 142 140 140  K 4.1 4.3 4.1 4.0 4.4 3.8  CL 107 105 105 109 108  --   CO2 23 23 23 24 24   --   GLUCOSE 85 86 110* 82 86  --   BUN 22 20 16 12 11   --   CREATININE 1.45* 1.53* 1.52* 1.42* 1.47*  --   CALCIUM 11.1* 12.1* 12.0* 11.8* 11.9*  --   MG  --  2.0 1.9 1.9 1.8  --    GFR: Estimated Creatinine Clearance: 36.8 mL/min (A) (by C-G formula based on SCr of 1.47 mg/dL (H)). Liver Function Tests: Recent Labs  Lab 09/30/24 1432 10/02/24 0349 10/03/24 0545  AST 21  --  27  ALT 14  --  14  ALKPHOS 75  --  76  BILITOT 0.5  --  1.3*  PROT 5.9*  --  6.1*  ALBUMIN 3.8 3.5 3.9   No results for input(s): LIPASE, AMYLASE in the last 168 hours. No results for input(s): AMMONIA in the last 168 hours. Coagulation Profile: No results for input(s): INR, PROTIME in the last 168 hours. Cardiac Enzymes: No results for input(s): CKTOTAL, CKMB, CKMBINDEX, TROPONINI in the last 168 hours. BNP (last 3 results) No results for input(s): PROBNP in the last 8760 hours. HbA1C: No results for input(s): HGBA1C in the last 72 hours. CBG: No results for input(s): GLUCAP in the last 168 hours. Lipid Profile: No results for input(s): CHOL, HDL, LDLCALC, TRIG, CHOLHDL, LDLDIRECT in the last 72 hours. Thyroid  Function Tests: No results for input(s): TSH, T4TOTAL, FREET4, T3FREE, THYROIDAB in the last 72 hours. Anemia Panel: No results for input(s): VITAMINB12, FOLATE, FERRITIN, TIBC, IRON, RETICCTPCT in the last 72 hours. Sepsis Labs: No results for input(s): PROCALCITON, LATICACIDVEN in the last 168 hours.  No results found for this or any previous visit (from the past 240 hours).       Radiology Studies: No results found.      Scheduled Meds:  acetaminophen   650 mg Oral Q6H   amiodarone   200 mg Oral q  AM   docusate sodium  100 mg Oral BID   pantoprazole  (PROTONIX ) IV  40 mg Intravenous Q12H   Continuous Infusions:   LOS: 6 days    Time spent: 35 minutes    Renato Applebaum, MD [facility]

## 2024-10-07 NOTE — Addendum Note (Signed)
 Addendum  created 10/07/24 1153 by Merla Almarie HERO, DO   Clinical Note Signed

## 2024-10-08 ENCOUNTER — Encounter (HOSPITAL_COMMUNITY): Payer: Self-pay | Admitting: Surgery

## 2024-10-08 DIAGNOSIS — I4819 Other persistent atrial fibrillation: Secondary | ICD-10-CM | POA: Diagnosis not present

## 2024-10-08 DIAGNOSIS — D649 Anemia, unspecified: Secondary | ICD-10-CM | POA: Diagnosis not present

## 2024-10-08 DIAGNOSIS — I509 Heart failure, unspecified: Secondary | ICD-10-CM | POA: Diagnosis not present

## 2024-10-08 LAB — CBC
HCT: 31.2 % — ABNORMAL LOW (ref 36.0–46.0)
HCT: 33.4 % — ABNORMAL LOW (ref 36.0–46.0)
Hemoglobin: 8.7 g/dL — ABNORMAL LOW (ref 12.0–15.0)
Hemoglobin: 9.2 g/dL — ABNORMAL LOW (ref 12.0–15.0)
MCH: 23.3 pg — ABNORMAL LOW (ref 26.0–34.0)
MCH: 22.8 pg — ABNORMAL LOW (ref 26.0–34.0)
MCHC: 27.9 g/dL — ABNORMAL LOW (ref 30.0–36.0)
MCHC: 27.5 g/dL — ABNORMAL LOW (ref 30.0–36.0)
MCV: 83.6 fL (ref 80.0–100.0)
MCV: 82.9 fL (ref 80.0–100.0)
Platelets: 258 K/uL (ref 150–400)
Platelets: 275 K/uL (ref 150–400)
RBC: 3.73 MIL/uL — ABNORMAL LOW (ref 3.87–5.11)
RBC: 4.03 MIL/uL (ref 3.87–5.11)
RDW: 24.6 % — ABNORMAL HIGH (ref 11.5–15.5)
RDW: 24.8 % — ABNORMAL HIGH (ref 11.5–15.5)
WBC: 7.3 K/uL (ref 4.0–10.5)
WBC: 9.3 K/uL (ref 4.0–10.5)
nRBC: 0 % (ref 0.0–0.2)
nRBC: 0 % (ref 0.0–0.2)

## 2024-10-08 LAB — APTT: aPTT: 62 s — ABNORMAL HIGH (ref 24–36)

## 2024-10-08 LAB — BASIC METABOLIC PANEL WITH GFR
Anion gap: 10 (ref 5–15)
BUN: 12 mg/dL (ref 8–23)
CO2: 22 mmol/L (ref 22–32)
Calcium: 11.3 mg/dL — ABNORMAL HIGH (ref 8.9–10.3)
Chloride: 107 mmol/L (ref 98–111)
Creatinine, Ser: 1.46 mg/dL — ABNORMAL HIGH (ref 0.44–1.00)
GFR, Estimated: 37 mL/min — ABNORMAL LOW (ref >60)
Glucose, Bld: 100 mg/dL — ABNORMAL HIGH (ref 70–99)
Potassium: 4.2 mmol/L (ref 3.5–5.1)
Sodium: 138 mmol/L (ref 135–145)

## 2024-10-08 LAB — MAGNESIUM: Magnesium: 1.7 mg/dL (ref 1.7–2.4)

## 2024-10-08 LAB — HEPARIN LEVEL (UNFRACTIONATED): Heparin Unfractionated: 0.51 [IU]/mL (ref 0.30–0.70)

## 2024-10-08 MED ORDER — OXYCODONE HCL 5 MG PO TABS: 5.0000 mg | ORAL_TABLET | ORAL | Status: DC | PRN

## 2024-10-08 MED ORDER — METHOCARBAMOL 500 MG PO TABS: 500.0000 mg | ORAL_TABLET | Freq: Four times a day (QID) | ORAL | Status: DC | PRN

## 2024-10-08 MED ORDER — ACETAMINOPHEN 500 MG PO TABS
1000.0000 mg | ORAL_TABLET | Freq: Four times a day (QID) | ORAL | Status: DC
  Administered 2024-10-08 – 2024-10-10 (×8): 1000 mg via ORAL
  Filled 2024-10-08 (×9): qty 2

## 2024-10-08 MED ORDER — HEPARIN (PORCINE) 25000 UT/250ML-% IV SOLN
1100.0000 [IU]/h | INTRAVENOUS | Status: DC
  Administered 2024-10-08 – 2024-10-09 (×2): 1100 [IU]/h via INTRAVENOUS
  Filled 2024-10-08 (×2): qty 250

## 2024-10-08 NOTE — Evaluation (Signed)
 Physical Therapy Evaluation Patient Details Name: Kelli Webster MRN: [id number] DOB: Apr 26, 1946 Today's Date: 10/08/2024  History of Present Illness  Pt is 78 yo female admitted on 10/01/24 with symptomatic anemia and found to have hepatic flexure mass.  Pt underwent R colectomy on 10/07/24.  Clinical Impression  Pt admitted with above diagnosis. At baseline, pt ambulatory with RW.  She lives with spouse and has support if needed.  Today, pt with some abdominal pain but was able to ambulate 15' with RW and CGA.  Pt did have difficulty with bed mobility requiring mod A but felt very hindered by the velcro sheets (too much friction).  Pt currently with functional limitations due to the deficits listed below (see PT Problem List). Pt will benefit from acute skilled PT to increase their independence and safety with mobility to allow discharge.  Pt expected to progress well - will likely need some assist from family at d/c but should be able to progress without further skilled therapy at d/c.          If plan is discharge home, recommend the following: A little help with walking and/or transfers;A little help with bathing/dressing/bathroom;Assistance with cooking/housework;Help with stairs or ramp for entrance   Can travel by private vehicle        Equipment Recommendations None recommended by PT  Recommendations for Other Services       Functional Status Assessment Patient has had a recent decline in their functional status and demonstrates the ability to make significant improvements in function in a reasonable and predictable amount of time.     Precautions / Restrictions Precautions Precautions: Fall      Mobility  Bed Mobility Overal bed mobility: Needs Assistance Bed Mobility: Supine to Sit     Supine to sit: Mod assist, HOB elevated, Used rails     General bed mobility comments: Needing assistance for lifting trunk and scooting forward.  Pt unable to slide forward due to  friction of pad and sheet - pt with multiple complaints about sheets like velcro    Transfers Overall transfer level: Needs assistance Equipment used: Rolling walker (2 wheels) Transfers: Sit to/from Stand Sit to Stand: Min assist           General transfer comment: cues for hand placement; min A to rise from bed and toilet; required assist for ADLs    Ambulation/Gait Ambulation/Gait assistance: Contact guard assist Gait Distance (Feet): 15 Feet (15'x2) Assistive device: Rolling walker (2 wheels) Gait Pattern/deviations: Step-to pattern, Decreased stride length, Trunk flexed Gait velocity: decreased     General Gait Details: min cues for posture  Stairs            Wheelchair Mobility     Tilt Bed    Modified Rankin (Stroke Patients Only)       Balance Overall balance assessment: Needs assistance Sitting-balance support: No upper extremity supported Sitting balance-Leahy Scale: Good     Standing balance support: Bilateral upper extremity supported, Reliant on assistive device for balance Standing balance-Leahy Scale: Poor                               Pertinent Vitals/Pain Pain Assessment Pain Assessment: Faces Faces Pain Scale: Hurts little more Pain Location: abdomen Pain Descriptors / Indicators: Discomfort Pain Intervention(s): Limited activity within patient's tolerance, Monitored during session, Premedicated before session, Repositioned    Home Living Family/patient expects to be discharged to:: Private residence Living Arrangements:  Spouse/significant other Available Help at Discharge: Available PRN/intermittently;Family Type of Home: House Home Access: Stairs to enter Entrance Stairs-Rails:  (no rail but has something to grab) Entrance Stairs-Number of Steps: 2   Home Layout: Multi-level;Able to live on main level with bedroom/bathroom Home Equipment: Pharmacist, hospital (2 wheels)      Prior Function Prior Level  of Function : Independent/Modified Independent;Driving             Mobility Comments: Does use RW due to back pain and some weakness ADLs Comments: independent with adls and iadls     Extremity/Trunk Assessment   Upper Extremity Assessment Upper Extremity Assessment: Overall WFL for tasks assessed    Lower Extremity Assessment Lower Extremity Assessment: Overall WFL for tasks assessed    Cervical / Trunk Assessment Cervical / Trunk Assessment: Other exceptions Cervical / Trunk Exceptions: increased body habitus  Communication        Cognition Arousal: Alert Behavior During Therapy: WFL for tasks assessed/performed   PT - Cognitive impairments: No apparent impairments                                 Cueing       General Comments      Exercises     Assessment/Plan    PT Assessment Patient needs continued PT services  PT Problem List Decreased strength;Decreased mobility;Decreased range of motion;Decreased activity tolerance;Decreased balance;Decreased knowledge of use of DME       PT Treatment Interventions DME instruction;Therapeutic exercise;Gait training;Stair training;Functional mobility training;Therapeutic activities;Patient/family education;Balance training    PT Goals (Current goals can be found in the Care Plan section)  Acute Rehab PT Goals Patient Stated Goal: return home PT Goal Formulation: With patient/family Time For Goal Achievement: 10/22/24 Potential to Achieve Goals: Good    Frequency Min 2X/week     Co-evaluation               AM-PAC PT 6 Clicks Mobility  Outcome Measure Help needed turning from your back to your side while in a flat bed without using bedrails?: A Little Help needed moving from lying on your back to sitting on the side of a flat bed without using bedrails?: A Lot Help needed moving to and from a bed to a chair (including a wheelchair)?: A Little Help needed standing up from a chair using your  arms (e.g., wheelchair or bedside chair)?: A Little Help needed to walk in hospital room?: A Little Help needed climbing 3-5 steps with a railing? : A Little 6 Click Score: 17    End of Session Equipment Utilized During Treatment: Gait belt Activity Tolerance: Patient tolerated treatment well Patient left: in chair;with call bell/phone within reach;with family/visitor present Nurse Communication: Mobility status PT Visit Diagnosis: Other abnormalities of gait and mobility (R26.89);Muscle weakness (generalized) (M62.81)    Time: 8779-8751 PT Time Calculation (min) (ACUTE ONLY): 28 min   Charges:   PT Evaluation $PT Eval Low Complexity: 1 Low PT Treatments $Gait Training: 8-22 mins PT General Charges $$ ACUTE PT VISIT: 1 Visit         Benjiman, PT Acute Rehab [facility] [phone]   Benjiman VEAR Mulberry 10/08/2024, 1:58 PM

## 2024-10-08 NOTE — Progress Notes (Incomplete)
 Progress Note  Patient Name: Kelli Webster Date of Encounter: 10/08/2024 [facility] Cardiologist: None Rolan  Interval Summary   Doing well today and in good spirits, have been getting up out of bed and into chair. Denied HF symptoms. Some abdominal pain at surgery sites.    Vital Signs Vitals:   10/07/24 2056 10/08/24 0450 10/08/24 0717 10/08/24 1213  BP: (!) 125/49 (!) 116/57  (!) 128/55  Pulse: 64 70  76  Resp: 17   15  Temp: (!) 97.5 F (36.4 C) 97.7 F (36.5 C)  97.9 F (36.6 C)  TempSrc:      SpO2: 100% 99%  91%  Weight:   103.7 kg   Height:        Intake/Output Summary (Last 24 hours) at 10/08/2024 1635 Last data filed at 10/08/2024 1634 Gross per 24 hour  Intake 21.82 ml  Output 400 ml  Net -378.18 ml      10/08/2024    7:17 AM 10/07/2024    7:12 AM 10/06/2024    5:00 AM  Last 3 Weights  Weight (lbs) 228 lb 9.9 oz 227 lb 15.3 oz 227 lb 15.3 oz  Weight (kg) 103.7 kg 103.4 kg 103.4 kg      Telemetry/ECG  AF HR 60-70, several AF pauses though < 2s. Occasional PVC - Personally Reviewed  Physical Exam  GEN: No acute distress.   Neck: No JVD Cardiac: Irregularly, irregular, no murmurs, rubs, or gallops.  Respiratory: Clear to auscultation bilaterally. MS: No edema  Assessment & Plan  Kelli Webster is a 78 y.o. female with a hx of paroxysmal A-Fib, OSA unable to tolerate CPAP, chronic HFrEF, and hyperparathyroidism who presented to the ED on 10/14 due to abnormal labwork at the request of her PCP. She had been reporting fatigue, shortness of breath, lightheadedness over the past 2 weeks  and hgb was found to be 7. FOBT +. GI was consulted and planned for EGD/colonoscopy. Admission echocardiogram showed LVEF was 30-35%, G3DD, normal RV systolic function, severe LAE, mild MR, normal IVC. Cardiology consulted.   HFrEF-NICM Thought to be tachy-mediated QRS duration this admission >150 ms Has declined cMRI historically 2/2 claustrophobia Previously  did not handle entresto  2/2 side effects  She has not required any diuresis this admission.  Net IO Since Admission: 6,100.32 mL [10/08/24 1648] Though no UOP has been charted.  On exam appears, euvolemic and is asymptomatic. Weight is stable from admission.  PTA GDMT: coreg  3.126 mg, farxiga  10 mg, cozaar  25 mg, and spironolactone  25 mg  Given pauses as seen below would avoid restarting coreg  for now.   Re-start spironolactone  25  Farxgia set to restart on 10/23 Coreg  on hold as above. Will hold on re-starting cozaar  today given BP readings  Given new EF reduction despite GDMT she will need to be assessed for possible CRT placement for primary prevention.   Persistent atrial fibrillation OSA not on CPAP [intolerant] Patient has been cardioverted twice historically, has not remained in NSR.  Unable to DCCV/pursue rhythm control at this time given inability to anticoagulate.  Telemetry did show AF pause, though less than <2 s and infrequent since stopping coreg . She remained asymptomatic.   Continue amiodarone  200 mg  Continue IV heparin , eliquis  remains on hold 2/2 acute GI bleed and recent colectomy. Resume eliquis  when okay by GI  Keep K > 4 and Mag > 2. Currently receiving Mag repletion.   Per primary Symptomatic blood loss anemia. Status post EGD colonoscopy on  10/16. Colon cancer s/p colectomy  Severe gastritis. Primary hyperparathyroidism. Mild hypercalcemia.    For questions or updates, please contact [facility] Please consult [email or web address] for contact info under       Signed, Leontine LOISE Salen, PA-C

## 2024-10-08 NOTE — Plan of Care (Signed)

## 2024-10-08 NOTE — Progress Notes (Signed)
 Progress Note  Patient Name: Kelli Webster Date of Encounter: 10/08/2024 Primary Cardiologist:  Rolan  Subjective   Feeling well overall with no evidence of volume overload and stable heart rates.  Vital Signs    Vitals:   10/07/24 2056 10/08/24 0450 10/08/24 0717 10/08/24 1213  BP: (!) 125/49 (!) 116/57  (!) 128/55  Pulse: 64 70  76  Resp: 17   15  Temp: (!) 97.5 F (36.4 C) 97.7 F (36.5 C)  97.9 F (36.6 C)  TempSrc:      SpO2: 100% 99%  91%  Weight:   103.7 kg   Height:        Intake/Output Summary (Last 24 hours) at 10/08/2024 1702 Last data filed at 10/08/2024 1634 Gross per 24 hour  Intake 21.82 ml  Output 400 ml  Net -378.18 ml   Filed Weights   10/06/24 0500 10/07/24 0712 10/08/24 0717  Weight: 103.4 kg 103.4 kg 103.7 kg    Physical Exam   GEN: No acute distress.   Neck: No JVD Cardiac: iRRR Respiratory: Clear to auscultation bilaterally. GI: Soft, nontender, non-distended  MS: No edema  Labs   Telemetry: AF    Chemistry Recent Labs  Lab 10/02/24 0349 10/03/24 0545 10/04/24 1108 10/06/24 0555 10/07/24 0444 10/07/24 0815 10/08/24 0531  NA 140 140   < > 142 140 140 138  K 4.1 4.3   < > 4.0 4.4 3.8 4.2  CL 107 105   < > 109 108  --  107  CO2 23 23   < > 24 24  --  22  GLUCOSE 85 86   < > 82 86  --  100*  BUN 22 20   < > 12 11  --  12  CREATININE 1.45* 1.53*   < > 1.42* 1.47*  --  1.46*  CALCIUM 11.1* 12.1*   < > 11.8* 11.9*  --  11.3*  PROT  --  6.1*  --   --   --   --   --   ALBUMIN 3.5 3.9  --   --   --   --   --   AST  --  27  --   --   --   --   --   ALT  --  14  --   --   --   --   --   ALKPHOS  --  76  --   --   --   --   --   BILITOT  --  1.3*  --   --   --   --   --   GFRNONAA 37* 35*   < > 38* 36*  --  37*  ANIONGAP 10 12   < > 9 9  --  10   < > = values in this interval not displayed.     Hematology Recent Labs  Lab 10/07/24 1055 10/08/24 0531 10/08/24 1254  WBC 8.1 7.3 9.3  RBC 4.31 3.73* 4.03  HGB 9.6* 8.7*  9.2*  HCT 36.0 31.2* 33.4*  MCV 83.5 83.6 82.9  MCH 22.3* 23.3* 22.8*  MCHC 26.7* 27.9* 27.5*  RDW 24.0* 24.6* 24.8*  PLT 285 258 275    Cardiac Studies   Cardiac Studies & Procedures   ______________________________________________________________________________________________     ECHOCARDIOGRAM  ECHOCARDIOGRAM COMPLETE 10/02/2024  Narrative ECHOCARDIOGRAM REPORT    Patient Name:   Kelli Webster Date of Exam: 10/02/2024 Medical Rec #:  [id number]   Height:       63.0 in Accession #:    [id number]  Weight:       221.1 lb Date of Birth:  08-22-1946   BSA:          2.018 m Patient Age:    77 years    BP:           144/63 mmHg Patient Gender: F           HR:           76 bpm. Exam Location:  Inpatient  Procedure: 2D Echo (Both Spectral and Color Flow Doppler were utilized during procedure).  Indications:    CHF  History:        Patient has prior history of Echocardiogram examinations. CHF.  Sonographer:    Norleen Amour Referring Phys: [id number] DALTON S MCLEAN  IMPRESSIONS   1. Left ventricular ejection fraction, by estimation, is 30 to 35%. The left ventricle has moderately decreased function. The left ventricle demonstrates global hypokinesis. There is mild left ventricular hypertrophy. Left ventricular diastolic parameters are consistent with Grade III diastolic dysfunction (restrictive). 2. Right ventricular systolic function is normal. The right ventricular size is normal. 3. Left atrial size was severely dilated. 4. The mitral valve is normal in structure. Mild mitral valve regurgitation. No evidence of mitral stenosis. 5. The aortic valve is tricuspid. Aortic valve regurgitation is trivial. No aortic stenosis is present. 6. The inferior vena cava is normal in size with greater than 50% respiratory variability, suggesting right atrial pressure of 3 mmHg.  FINDINGS Left Ventricle: Left ventricular ejection fraction, by estimation, is 30 to 35%. The left ventricle  has moderately decreased function. The left ventricle demonstrates global hypokinesis. The left ventricular internal cavity size was normal in size. There is mild left ventricular hypertrophy. Left ventricular diastolic parameters are consistent with Grade III diastolic dysfunction (restrictive).  Right Ventricle: The right ventricular size is normal. Right ventricular systolic function is normal.  Left Atrium: Left atrial size was severely dilated.  Right Atrium: Right atrial size was normal in size.  Pericardium: There is no evidence of pericardial effusion.  Mitral Valve: The mitral valve is normal in structure. Mild mitral valve regurgitation. No evidence of mitral valve stenosis. MV peak gradient, 8.4 mmHg. The mean mitral valve gradient is 2.0 mmHg.  Tricuspid Valve: The tricuspid valve is normal in structure. Tricuspid valve regurgitation is trivial. No evidence of tricuspid stenosis.  Aortic Valve: The aortic valve is tricuspid. Aortic valve regurgitation is trivial. Aortic regurgitation PHT measures 694 msec. No aortic stenosis is present. Aortic valve mean gradient measures 7.5 mmHg. Aortic valve peak gradient measures 16.4 mmHg. Aortic valve area, by VTI measures 1.29 cm.  Pulmonic Valve: The pulmonic valve was normal in structure. Pulmonic valve regurgitation is trivial. No evidence of pulmonic stenosis.  Aorta: The aortic root is normal in size and structure.  Venous: The inferior vena cava is normal in size with greater than 50% respiratory variability, suggesting right atrial pressure of 3 mmHg.  IAS/Shunts: No atrial level shunt detected by color flow Doppler.   LEFT VENTRICLE PLAX 2D LVIDd:         6.00 cm      Diastology LVIDs:         4.10 cm      LV e' medial:    7.18 cm/s LV PW:         0.90 cm      LV E/e'  medial:  20.6 LV IVS:        0.80 cm      LV e' lateral:   9.57 cm/s LVOT diam:     2.15 cm      LV E/e' lateral: 15.5 LV SV:         47 LV SV Index:    23 LVOT Area:     3.63 cm  LV Volumes (MOD) LV vol d, MOD A2C: 102.0 ml LV vol d, MOD A4C: 117.0 ml LV vol s, MOD A2C: 60.5 ml LV vol s, MOD A4C: 54.0 ml LV SV MOD A2C:     41.5 ml LV SV MOD A4C:     117.0 ml LV SV MOD BP:      51.3 ml  RIGHT VENTRICLE             IVC RV Basal diam:  3.10 cm     IVC diam: 1.80 cm RV S prime:     11.20 cm/s TAPSE (M-mode): 1.7 cm  LEFT ATRIUM              Index        RIGHT ATRIUM           Index LA diam:        4.80 cm  2.38 cm/m   RA Area:     18.60 cm LA Vol (A2C):   115.0 ml 56.99 ml/m  RA Volume:   50.30 ml  24.93 ml/m LA Vol (A4C):   97.8 ml  48.47 ml/m LA Biplane Vol: 108.0 ml 53.52 ml/m AORTIC VALVE                     PULMONIC VALVE AV Area (Vmax):    1.29 cm      PV Vmax:          1.14 m/s AV Area (Vmean):   1.46 cm      PV Peak grad:     5.2 mmHg AV Area (VTI):     1.29 cm      PR End Diast Vel: 7.62 msec AV Vmax:           202.25 cm/s AV Vmean:          128.250 cm/s AV VTI:            0.363 m AV Peak Grad:      16.4 mmHg AV Mean Grad:      7.5 mmHg LVOT Vmax:         72.03 cm/s LVOT Vmean:        51.733 cm/s LVOT VTI:          0.129 m LVOT/AV VTI ratio: 0.36 AI PHT:            694 msec  AORTA Ao Root diam: 2.40 cm Ao Asc diam:  3.20 cm  MITRAL VALVE MV Area (PHT): 3.14 cm       SHUNTS MV Area VTI:   1.56 cm       Systemic VTI:  0.13 m MV Peak grad:  8.4 mmHg       Systemic Diam: 2.15 cm MV Mean grad:  2.0 mmHg MV Vmax:       1.45 m/s MV Vmean:      65.5 cm/s MR Peak grad:    113.2 mmHg MR Mean grad:    68.0 mmHg MR Vmax:         532.00 cm/s MR Vmean:  394.0 cm/s MR PISA:         1.01 cm MR PISA Eff ROA: 6 mm MR PISA Radius:  0.40 cm MV E velocity: 148.00 cm/s MV A velocity: 45.90 cm/s MV E/A ratio:  3.22  Redell Shallow MD Electronically signed by Redell Shallow MD Signature Date/Time: 10/02/2024/3:25:28 PM    Final           ______________________________________________________________________________________________      Assessment & Plan    Heart failure with reduced ejection fraction - NYHA I, euvolemic, Stage C - Ejection fraction decreased to 30-35%.  - Reinstitute GDMT with beta blocker, SGLT2 inhibitor, MRA, and ARB when able as tolerated.  Coreg  on hold due to brief pauses and bradycardia and atrial fibrillation, resume when able. - No lasix  needed now, appears euvolemic postoperatively.  Does not take Lasix  at home but rather uses spironolactone .   Persistent atrial fibrillation - Continue amiodarone  200 mg daily -  Now on IV heparin , if well-tolerated restart Eliquis  when safe from postsurgical standpoint. - Brief 2-second pauses in atrial fibrillation, nonsignificant. -Can consider repeat cardioversion if A-fib well-tolerated after 4 weeks of uninterrupted anticoagulation, will reassess at follow-up.    For questions or updates, please contact [facility] Please consult [email or web address] for contact info under Cardiology/STEMI.  Terryn Rosenkranz A Arley Salamone, MD

## 2024-10-08 NOTE — Progress Notes (Signed)
 PHARMACY - ANTICOAGULATION CONSULT NOTE  Pharmacy Consult for IV heparin  Indication: atrial fibrillation  No Known Allergies  Patient Measurements: Height: 5' 3 (160 cm) Weight: 103.7 kg (228 lb 9.9 oz) IBW/kg (Calculated) : 52.4 HEPARIN  DW (KG): 76.9  Vital Signs: Temp: 98.4 F (36.9 C) (10/21 2303) BP: 127/55 (10/21 2303) Pulse Rate: 69 (10/21 2303)  Labs: Recent Labs    10/06/24 0555 10/07/24 0444 10/07/24 0815 10/07/24 1055 10/08/24 0531 10/08/24 1254 10/08/24 2219  HGB 9.3*  --    < > 9.6* 8.7* 9.2*  --   HCT 32.3*  --    < > 36.0 31.2* 33.4*  --   PLT 363  --   --  285 258 275  --   APTT  --   --   --   --   --   --  62*  HEPARINUNFRC  --   --   --   --   --   --  0.51  CREATININE 1.42* 1.47*  --   --  1.46*  --   --    < > = values in this interval not displayed.    Estimated Creatinine Clearance: 37.1 mL/min (A) (by C-G formula based on SCr of 1.46 mg/dL (H)).   Medical History: Past Medical History:  Diagnosis Date   Chronic kidney disease    CKD3a   Chronic systolic heart failure (HCC)    a. ECHO (09/2013) EF 20-25% diff HK, mild/mod MR, LA mod dil, RV mildly dil, RA mildly dil b. R/LHC (09/26/13) AO 133/88, LV 137/17, Lmain no dz, LAD 30% prox, LCX mild luminal irreg; RCA mild luminal irreg   Dysrhythmia    A.fib   had cardioversions and ablations   GERD (gastroesophageal reflux disease)    a. 09/2013 endoscopy nl   Hypertension    Morbid obesity (HCC)    Osteoarthritis    Persistent atrial fibrillation (HCC) Chads2vascscore of at least 4(02/12/15)   a. Dx 09/2013 b. s/p PVI by Dr Kelsie 11-26-2013   Sleep apnea    No CPAP d/t claustrophobia    Medications:  Medications Prior to Admission  Medication Sig Dispense Refill Last Dose/Taking   amiodarone  (PACERONE ) 200 MG tablet Take 200 mg Twice daily for 1 week then 200 mg Daily (Patient taking differently: Take 200 mg by mouth in the morning.)   09/30/2024 at 11:00 AM   carvedilol  (COREG ) 3.125  MG tablet TAKE 1 TABLET BY MOUTH 2 TIMES DAILY. 180 tablet 3 10/01/2024 at 11:00 AM   ELIQUIS  5 MG TABS tablet TAKE 1 TABLET BY MOUTH TWICE  DAILY 180 tablet 3 10/01/2024 at 11:00 AM   famotidine  (PEPCID ) 20 MG tablet Take 20 mg by mouth daily as needed for heartburn or indigestion.   Unknown   losartan  (COZAAR ) 25 MG tablet Take 1 tablet (25 mg total) by mouth daily. 90 tablet 3 10/01/2024 Morning   MAGNESIUM PO Take 1 tablet by mouth at bedtime.   09/30/2024 Bedtime   Multiple Vitamins-Minerals (CENTRUM WOMEN) TABS Take 1 tablet by mouth daily with breakfast.   10/01/2024 Morning   spironolactone  (ALDACTONE ) 25 MG tablet Take 1 tablet (25 mg total) by mouth daily. (Patient taking differently: Take 25 mg by mouth at bedtime.) 90 tablet 3 09/30/2024 Bedtime   TYLENOL  500 MG tablet Take 500-1,000 mg by mouth every 6 (six) hours as needed (for pain or headaches).   Unknown   FARXIGA  10 MG TABS tablet TAKE 1 TABLET BY MOUTH  DAILY BEFORE BREAKFAST. 30 tablet 11     Assessment: Pharmacy is consulted to start heparin  for 78 yo female with PMH of atrial fibrillation. Pt on apixaban  PTA, which has been on hold for laparoscopic right colectomy by Dr. Lyndel on 10/07/24. Last dose on  10/14. Per surgery, ok to start heparin  with no bolus as Hgb has been stable.  Today, 10/08/24 Initial heparin  level 0.51- therapeutic on IV heparin  1100 units/hr >Since 7 days without apixaban  dose and aPTT/heparin  level similar will dose using heparin  levels CBC: Hg decreased slightly to 9.2, pltc WNL No bleeding or infusion related concerns reported by RN  Goal of Therapy:  Heparin  level 0.3-0.7 units/ml Monitor platelets by anticoagulation protocol: Yes   Plan No bolus per consult Continue heparin  infusion at 1100 units/hr  Check confirmatory heparin  level ~0800 Daily CBC & heparin  level while on heparin  Monitor for signs and symptoms of bleeding  Rosaline Millet, PharmD, BCPS 10/08/2024 11:26 PM

## 2024-10-08 NOTE — Progress Notes (Addendum)
 1 Day Post-Op  Subjective: CC: Patient reports her pain is improved today and is currently having incisional pain with movement. Tolerating clears without n/v. Passed flatus since surgery. No BM. Has not been oob. Foley is in place. On o2. Does not wear o2 at home. Denies cp or sob. Reports she walks at baseline with a cane. Lives at home with her husband.   Objective: Vital signs in last 24 hours: Temp:  [97 F (36.1 C)-97.7 F (36.5 C)] 97.7 F (36.5 C) (10/21 0450) Pulse Rate:  [57-70] 70 (10/21 0450) Resp:  [12-19] 17 (10/20 2056) BP: (116-180)/(49-73) 116/57 (10/21 0450) SpO2:  [93 %-100 %] 99 % (10/21 0450) Last BM Date : 10/05/24  Intake/Output from previous day: 10/20 0701 - 10/21 0700 In: 1800 [I.V.:1200; IV Piggyback:600] Out: 120 [Urine:20; Blood:100] Intake/Output this shift: No intake/output data recorded.  PE: Gen:  Alert, NAD, pleasant Pulm:  Rate and effort normal, on o2.  Abd: Soft, mild distension, some right sided ttp as well as appropriately tender around incisions, no rigidity or guarding and otherwise NT. Laparoscopic incisions with glue intact appears well and are without drainage, bleeding, or signs of infection. Midline incisions with glue intact with some bruising - no drainage, bleeding, or signs of infection.  GU: Foley in place with straw colored urine in foley bag - 250cc in the bag currently. Will check with RN on I/O.  Psych: A&Ox3   Lab Results:  Recent Labs    10/07/24 1055 10/08/24 0531  WBC 8.1 7.3  HGB 9.6* 8.7*  HCT 36.0 31.2*  PLT 285 258   BMET Recent Labs    10/06/24 0555 10/07/24 0444 10/07/24 0815  NA 142 140 140  K 4.0 4.4 3.8  CL 109 108  --   CO2 24 24  --   GLUCOSE 82 86  --   BUN 12 11  --   CREATININE 1.42* 1.47*  --   CALCIUM 11.8* 11.9*  --    PT/INR No results for input(s): LABPROT, INR in the last 72 hours. CMP     Component Value Date/Time   NA 140 10/07/2024 0815   NA 144 09/21/2023 1341    K 3.8 10/07/2024 0815   CL 108 10/07/2024 0444   CO2 24 10/07/2024 0444   GLUCOSE 86 10/07/2024 0444   BUN 11 10/07/2024 0444   BUN 33 (H) 09/21/2023 1341   CREATININE 1.47 (H) 10/07/2024 0444   CALCIUM 11.9 (H) 10/07/2024 0444   PROT 6.1 (L) 10/03/2024 0545   PROT 6.1 09/21/2023 1341   ALBUMIN 3.9 10/03/2024 0545   ALBUMIN 3.6 (L) 09/21/2023 1341   AST 27 10/03/2024 0545   ALT 14 10/03/2024 0545   ALKPHOS 76 10/03/2024 0545   BILITOT 1.3 (H) 10/03/2024 0545   BILITOT 0.4 09/21/2023 1341   GFRNONAA 36 (L) 10/07/2024 0444   GFRAA 40 (L) 01/29/2021 1006   Lipase  No results found for: LIPASE  Studies/Results: No results found.  Anti-infectives: Anti-infectives (From admission, onward)    Start     Dose/Rate Route Frequency Ordered Stop   10/07/24 0600  cefoTEtan (CEFOTAN) 2 g in sodium chloride  0.9 % 100 mL IVPB        2 g 200 mL/hr over 30 Minutes Intravenous On call to O.R. 10/07/24 0015 10/07/24 0833   10/04/24 1400  neomycin (MYCIFRADIN) tablet 1,000 mg       Placed in And Linked Group   1,000 mg Oral 3 times  per day 10/07/24 0015 10/05/24 1359   10/04/24 1400  metroNIDAZOLE (FLAGYL) tablet 1,000 mg       Placed in And Linked Group   1,000 mg Oral 3 times per day 10/07/24 0015 10/05/24 1359        Assessment/Plan POD 1 s/p Laparoscopic right colectomy by Dr. Lyndel on 10/07/24 for Bleeding hepatic flexure colon mass  - Path pending. Colonoscopy path with Tubular adenoma without high-grade dysplasia or malignancy  - CEA pre-op wnl. CT CAP done with tracheosophageal lymph node and Multiple pulmonary nodules  - Adv to FLD - Adjust pain medications - PM CBC - Mobilize, PT - Pulm toilet, wean o2  FEN - FLD, IVF per TRH VTE - SCDs, Eliquis  on hold, hold heparin  gtt from now. Will recheck CBC this PM (hgb 8.7 from 9.6). If hgb stable on PM check, okay for heparin  gtt with no bolus ID - Cefotetan peri-op, none currently.  Foley - Will confirm I/O with  RN. If good UOP, plan to discontinue foley today for TOV. Cr stable.     LOS: 7 days    Kelli Webster Kelli Webster, [facility] 10/08/2024, 8:14 AM Please see Amion for pager number during day hours 7:00am-4:30pm

## 2024-10-08 NOTE — Progress Notes (Signed)
 PHARMACY - ANTICOAGULATION CONSULT NOTE  Pharmacy Consult for IV heparin  Indication: atrial fibrillation  No Known Allergies  Patient Measurements: Height: 5' 3 (160 cm) Weight: 103.7 kg (228 lb 9.9 oz) IBW/kg (Calculated) : 52.4 HEPARIN  DW (KG): 76.9  Vital Signs: Temp: 97.9 F (36.6 C) (10/21 1213) BP: 128/55 (10/21 1213) Pulse Rate: 76 (10/21 1213)  Labs: Recent Labs    10/06/24 0555 10/07/24 0444 10/07/24 0815 10/07/24 1055 10/08/24 0531 10/08/24 1254  HGB 9.3*  --    < > 9.6* 8.7* 9.2*  HCT 32.3*  --    < > 36.0 31.2* 33.4*  PLT 363  --   --  285 258 275  CREATININE 1.42* 1.47*  --   --  1.46*  --    < > = values in this interval not displayed.    Estimated Creatinine Clearance: 37.1 mL/min (A) (by C-G formula based on SCr of 1.46 mg/dL (H)).   Medical History: Past Medical History:  Diagnosis Date   Chronic kidney disease    CKD3a   Chronic systolic heart failure (HCC)    a. ECHO (09/2013) EF 20-25% diff HK, mild/mod MR, LA mod dil, RV mildly dil, RA mildly dil b. R/LHC (09/26/13) AO 133/88, LV 137/17, Lmain no dz, LAD 30% prox, LCX mild luminal irreg; RCA mild luminal irreg   Dysrhythmia    A.fib   had cardioversions and ablations   GERD (gastroesophageal reflux disease)    a. 09/2013 endoscopy nl   Hypertension    Morbid obesity (HCC)    Osteoarthritis    Persistent atrial fibrillation (HCC) Chads2vascscore of at least 4(02/12/15)   a. Dx 09/2013 b. s/p PVI by Dr Kelsie 11-26-2013   Sleep apnea    No CPAP d/t claustrophobia    Medications:  Medications Prior to Admission  Medication Sig Dispense Refill Last Dose/Taking   amiodarone  (PACERONE ) 200 MG tablet Take 200 mg Twice daily for 1 week then 200 mg Daily (Patient taking differently: Take 200 mg by mouth in the morning.)   09/30/2024 at 11:00 AM   carvedilol  (COREG ) 3.125 MG tablet TAKE 1 TABLET BY MOUTH 2 TIMES DAILY. 180 tablet 3 10/01/2024 at 11:00 AM   ELIQUIS  5 MG TABS tablet TAKE 1 TABLET  BY MOUTH TWICE  DAILY 180 tablet 3 10/01/2024 at 11:00 AM   famotidine  (PEPCID ) 20 MG tablet Take 20 mg by mouth daily as needed for heartburn or indigestion.   Unknown   losartan  (COZAAR ) 25 MG tablet Take 1 tablet (25 mg total) by mouth daily. 90 tablet 3 10/01/2024 Morning   MAGNESIUM PO Take 1 tablet by mouth at bedtime.   09/30/2024 Bedtime   Multiple Vitamins-Minerals (CENTRUM WOMEN) TABS Take 1 tablet by mouth daily with breakfast.   10/01/2024 Morning   spironolactone  (ALDACTONE ) 25 MG tablet Take 1 tablet (25 mg total) by mouth daily. (Patient taking differently: Take 25 mg by mouth at bedtime.) 90 tablet 3 09/30/2024 Bedtime   TYLENOL  500 MG tablet Take 500-1,000 mg by mouth every 6 (six) hours as needed (for pain or headaches).   Unknown   FARXIGA  10 MG TABS tablet TAKE 1 TABLET BY MOUTH DAILY BEFORE BREAKFAST. 30 tablet 11     Assessment: Pharmacy is consulted to start heparin  for 78 yo female with PMH of atrial fibrillation. Pt on apixaban  PTA, which has been on hold for laparoscopic right colectomy by Dr. Lyndel on 10/07/24. Last dose on  10/14. Per surgery, ok to start heparin  with  no drip as Hgb has been stable   Today, 10/08/24 Hgb 9.2, plt 275  Scr 1.46 mg/dl Per consult, no bolus   Goal of Therapy:  Heparin  level 0.3-0.7 units/ml Monitor platelets by anticoagulation protocol: Yes   Plan No bolus pre consult Heparin  1100 units/hr  Obtain HL/aPTT 8 hours after start of infusion Daily CBC while on heparin  Monitor for signs and symptoms of bleeding    Dolphus Roller, PharmD, BCPS 10/08/2024 1:57 PM

## 2024-10-08 NOTE — Progress Notes (Signed)
 [facility] Progress Note Patient: Kelli Webster FMW:[id number] DOB: [birth year]  DOA: 10/01/2024 DOS: the patient was seen and examined on 10/08/2024  Brief Hospital Course: Patient with PMH of Chronic combined CHF, Eliquis  for A-fib, CKD 3B, OSA, HTN, primary hyperparathyroidism with recently scheduled outpatient surgery, obesity Presented to the hospital with complaints of abnormal lab. Received 1 PRBC transfusion.  [facility] consulted.  Will monitor recommendation. Assessment and Plan: Symptomatic blood loss anemia. Status post EGD colonoscopy on 10/16. Colonic mass. Severe gastritis. Multiple colonic polyps. Likely chronic in nature. Patient never had a colonoscopy. Initially patient was planning to get procedure done outpatient although after further discussion patient was agreeable to EGD colonoscopy in the hospital. Hemoglobin was 7.1 upon admission, improved to 9, baseline appears to be around 11 for the patient. Iron level was low which was replaced. Continue with PPI EGD showed severe gastritis and colonoscopy shows hepatic mass.  Biopsy from the endoscopic evaluation negative for any malignancy. Appreciate GI consultation. CT chest abdomen pelvis shows hepatic flexure mass as well as some pulmonary nodules. CEA normal. Appreciate general surgery consultation. Discussed with cardiology, okay to hold anticoagulation for now. Underwent surgery..  Biopsy currently pending from the surgical pathology but Currently on heparin .   Paroxysmal A-fib. On anticoagulation with Eliquis . Currently appears to be rate controlled. Continue amiodarone  and Coreg . Currently on heparin .   Primary hyperparathyroidism. Mild hypercalcemia. Currently scheduled for outpatient procedure.  Surgery will be now postponed until patient recovers from colectomy. Will monitor.   Chronic combined systolic and diastolic CHF. Echocardiogram shows EF of 40 to 45% with grade 1 diastolic  dysfunction. Does not appear to be severely volume overloaded. Repeat echocardiogram shows worsening EF.  Appreciate cardio consultation.  No further workup right now. Will monitor.   Obesity Class 1 Body mass index is 37.18 kg/m.  Placing the pt at higher risk of poor outcomes.  Normal   Subjective: No nausea no vomiting.  No bleeding.  No fever no chills.  Physical Exam: Clear to auscultation. S1-S2 present Bowel sound present Trace edema.  Data Reviewed: I have Reviewed nursing notes, Vitals, and Lab results. Since last encounter, pertinent lab results CBC and BMP   . I have ordered test including CBC and BMP  . I have discussed pt's care plan and test results with general surgery  .   Disposition: Status is: Inpatient Remains inpatient appropriate because: Monitor for improvement in postop recovery  SCDs Start: 10/01/24 1850   Family Communication: Husband at bedside Level of care: Telemetry   Vitals:   10/07/24 2056 10/08/24 0450 10/08/24 0717 10/08/24 1213  BP: (!) 125/49 (!) 116/57  (!) 128/55  Pulse: 64 70  76  Resp: 17   15  Temp: (!) 97.5 F (36.4 C) 97.7 F (36.5 C)  97.9 F (36.6 C)  TempSrc:      SpO2: 100% 99%  91%  Weight:   103.7 kg   Height:         Author: Yetta Blanch, MD 10/08/2024 6:21 PM  Please look on [email or web address] to find out who is on call.

## 2024-10-09 DIAGNOSIS — I5022 Chronic systolic (congestive) heart failure: Secondary | ICD-10-CM | POA: Diagnosis not present

## 2024-10-09 DIAGNOSIS — D649 Anemia, unspecified: Secondary | ICD-10-CM | POA: Diagnosis not present

## 2024-10-09 DIAGNOSIS — I4819 Other persistent atrial fibrillation: Secondary | ICD-10-CM | POA: Diagnosis not present

## 2024-10-09 LAB — CBC WITH DIFFERENTIAL/PLATELET
Abs Immature Granulocytes: 0.06 K/uL (ref 0.00–0.07)
Basophils Absolute: 0.1 K/uL (ref 0.0–0.1)
Basophils Relative: 1 %
Eosinophils Absolute: 0.1 K/uL (ref 0.0–0.5)
Eosinophils Relative: 1 %
HCT: 30.5 % — ABNORMAL LOW (ref 36.0–46.0)
Hemoglobin: 8.5 g/dL — ABNORMAL LOW (ref 12.0–15.0)
Immature Granulocytes: 1 %
Lymphocytes Relative: 11 %
Lymphs Abs: 1 K/uL (ref 0.7–4.0)
MCH: 23.3 pg — ABNORMAL LOW (ref 26.0–34.0)
MCHC: 27.9 g/dL — ABNORMAL LOW (ref 30.0–36.0)
MCV: 83.6 fL (ref 80.0–100.0)
Monocytes Absolute: 0.6 K/uL (ref 0.1–1.0)
Monocytes Relative: 7 %
Neutro Abs: 6.9 K/uL (ref 1.7–7.7)
Neutrophils Relative %: 79 %
Platelets: 251 K/uL (ref 150–400)
RBC: 3.65 MIL/uL — ABNORMAL LOW (ref 3.87–5.11)
RDW: 24.7 % — ABNORMAL HIGH (ref 11.5–15.5)
Smear Review: NORMAL
WBC: 8.7 K/uL (ref 4.0–10.5)
nRBC: 0 % (ref 0.0–0.2)

## 2024-10-09 LAB — BASIC METABOLIC PANEL WITH GFR
Anion gap: 9 (ref 5–15)
BUN: 18 mg/dL (ref 8–23)
CO2: 22 mmol/L (ref 22–32)
Calcium: 11.3 mg/dL — ABNORMAL HIGH (ref 8.9–10.3)
Chloride: 106 mmol/L (ref 98–111)
Creatinine, Ser: 1.44 mg/dL — ABNORMAL HIGH (ref 0.44–1.00)
GFR, Estimated: 37 mL/min — ABNORMAL LOW (ref >60)
Glucose, Bld: 104 mg/dL — ABNORMAL HIGH (ref 70–99)
Potassium: 4.2 mmol/L (ref 3.5–5.1)
Sodium: 137 mmol/L (ref 135–145)

## 2024-10-09 LAB — CBC
HCT: 33 % — ABNORMAL LOW (ref 36.0–46.0)
Hemoglobin: 9.2 g/dL — ABNORMAL LOW (ref 12.0–15.0)
MCH: 23.4 pg — ABNORMAL LOW (ref 26.0–34.0)
MCHC: 27.9 g/dL — ABNORMAL LOW (ref 30.0–36.0)
MCV: 83.8 fL (ref 80.0–100.0)
Platelets: 306 K/uL (ref 150–400)
RBC: 3.94 MIL/uL (ref 3.87–5.11)
RDW: 24.9 % — ABNORMAL HIGH (ref 11.5–15.5)
WBC: 10.1 K/uL (ref 4.0–10.5)
nRBC: 0 % (ref 0.0–0.2)

## 2024-10-09 LAB — HEPARIN LEVEL (UNFRACTIONATED): Heparin Unfractionated: 0.54 [IU]/mL (ref 0.30–0.70)

## 2024-10-09 LAB — MAGNESIUM: Magnesium: 1.6 mg/dL — ABNORMAL LOW (ref 1.7–2.4)

## 2024-10-09 MED ORDER — SPIRONOLACTONE 25 MG PO TABS
25.0000 mg | ORAL_TABLET | Freq: Every day | ORAL | Status: DC
  Administered 2024-10-09 – 2024-10-10 (×2): 25 mg via ORAL
  Filled 2024-10-09 (×2): qty 1

## 2024-10-09 MED ORDER — MAGNESIUM SULFATE 2 GM/50ML IV SOLN
2.0000 g | Freq: Once | INTRAVENOUS | Status: AC
  Administered 2024-10-09: 2 g via INTRAVENOUS
  Filled 2024-10-09: qty 50

## 2024-10-09 MED ORDER — APIXABAN 5 MG PO TABS
5.0000 mg | ORAL_TABLET | Freq: Two times a day (BID) | ORAL | Status: DC
  Administered 2024-10-09 – 2024-10-10 (×2): 5 mg via ORAL
  Filled 2024-10-09 (×2): qty 1

## 2024-10-09 MED ORDER — DAPAGLIFLOZIN PROPANEDIOL 10 MG PO TABS
10.0000 mg | ORAL_TABLET | Freq: Every day | ORAL | Status: DC
  Administered 2024-10-10: 10 mg via ORAL
  Filled 2024-10-09: qty 1

## 2024-10-09 NOTE — Progress Notes (Signed)
 [facility] Progress Note Patient: Kelli Webster FMW:[id number] DOB: 1946/06/28  DOA: 10/01/2024 DOS: the patient was seen and examined on 10/09/2024  Brief Hospital Course: Patient with PMH of Chronic combined CHF, Eliquis  for A-fib, CKD 3B, OSA, HTN, primary hyperparathyroidism with recently scheduled outpatient surgery, obesity Presented to the hospital with complaints of abnormal lab. Received 1 PRBC transfusion.  [facility] consulted.  Will monitor recommendation. Assessment and Plan: Symptomatic blood loss anemia. Status post EGD colonoscopy on 10/16. Colonic mass. Severe gastritis. Multiple colonic polyps. Likely chronic in nature. Patient never had a colonoscopy. Hemoglobin was 7.1 upon admission, improved to 9, baseline appears to be around 11 for the patient. Iron level was low which was replaced. Continue with PPI EGD showed severe gastritis and colonoscopy shows hepatic mass.  Biopsy from the endoscopic evaluation negative for any malignancy. Appreciate GI consultation. CT chest abdomen pelvis shows hepatic flexure mass as well as some pulmonary nodules. CEA normal. Appreciate general surgery consultation. Discussed with cardiology, okay to hold anticoagulation for now. Underwent laparoscopic right colectomy on 10/20. Biopsy currently pending from the surgical pathology  Currently on heparin .   Paroxysmal A-fib. On anticoagulation with Eliquis . Currently appears to be rate controlled. Continue amiodarone  and Coreg . Currently on heparin .  Will resume Eliquis  likely later today if her repeat H&H is stable.   Primary hyperparathyroidism. Mild hypercalcemia. Currently scheduled for outpatient procedure.  Surgery will be now postponed until patient recovers from colectomy. Will monitor.   Chronic combined systolic and diastolic CHF. Echocardiogram shows EF of 40 to 45% with grade 1 diastolic dysfunction. Does not appear to be severely volume overloaded. Repeat  echocardiogram shows worsening EF.  Appreciate cardio consultation.  No further workup right now. Will monitor.   Obesity Class 1 Body mass index is 37.18 kg/m.  Placing the pt at higher risk of poor outcomes.  Normal   Subjective: No nausea no vomiting.  No fever no chills.  Denies any acute complaint.  Passing gas.  Physical Exam: Clear to auscultation. S1-S2 present and bowel sound present. No edema.  Data Reviewed: I have Reviewed nursing notes, Vitals, and Lab results. Since last encounter, pertinent lab results CBC and BMP   . I have ordered test including CBC and BMP  . I have discussed pt's care plan and test results with general surgery  .   Disposition: Status is: Inpatient Remains inpatient appropriate because: Surgically stable for discharge.  Awaiting stability of hemoglobin.  SCDs Start: 10/01/24 1850   Family Communication: No one at bedside. Level of care: Telemetry   Vitals:   10/08/24 0717 10/08/24 1213 10/08/24 2303 10/09/24 0635  BP:  (!) 128/55 (!) 127/55 (!) 125/54  Pulse:  76 69 63  Resp:  15 20 18   Temp:  97.9 F (36.6 C) 98.4 F (36.9 C) 98.2 F (36.8 C)  TempSrc:      SpO2:  91% 99% 98%  Weight: 103.7 kg   100.2 kg  Height:         Author: Yetta Blanch, MD 10/09/2024 5:01 PM  Please look on [email or web address] to find out who is on call.

## 2024-10-09 NOTE — Plan of Care (Signed)
  Problem: Education: Goal: Knowledge of General Education information will improve Description: Including pain rating scale, medication(s)/side effects and non-pharmacologic comfort measures Outcome: Progressing   Problem: Health Behavior/Discharge Planning: Goal: Ability to manage health-related needs will improve Outcome: Progressing   Problem: Clinical Measurements: Goal: Will remain free from infection Outcome: Progressing Goal: Diagnostic test results will improve Outcome: Progressing   Problem: Elimination: Goal: Will not experience complications related to urinary retention Outcome: Progressing   Problem: Pain Managment: Goal: General experience of comfort will improve and/or be controlled Outcome: Progressing   Problem: Skin Integrity: Goal: Risk for impaired skin integrity will decrease Outcome: Progressing

## 2024-10-09 NOTE — Progress Notes (Signed)
 PHARMACY - ANTICOAGULATION CONSULT NOTE  Pharmacy Consult for IV heparin  Indication: atrial fibrillation  No Known Allergies  Patient Measurements: Height: 5' 3 (160 cm) Weight: 100.2 kg (220 lb 14.4 oz) IBW/kg (Calculated) : 52.4 HEPARIN  DW (KG): 76.9  Vital Signs: Temp: 98.2 F (36.8 C) (10/22 0635) BP: 125/54 (10/22 0635) Pulse Rate: 63 (10/22 0635)  Labs: Recent Labs    10/07/24 0444 10/07/24 0815 10/07/24 1055 10/08/24 0531 10/08/24 1254 10/08/24 2219 10/09/24 0555  HGB  --    < > 9.6* 8.7* 9.2*  --   --   HCT  --    < > 36.0 31.2* 33.4*  --   --   PLT  --   --  285 258 275  --   --   APTT  --   --   --   --   --  62*  --   HEPARINUNFRC  --   --   --   --   --  0.51 0.54  CREATININE 1.47*  --   --  1.46*  --   --   --    < > = values in this interval not displayed.    Estimated Creatinine Clearance: 36.4 mL/min (A) (by C-G formula based on SCr of 1.46 mg/dL (H)).  Assessment: Pharmacy is consulted to start heparin  for 78 yo female with PMH of atrial fibrillation. Pt on apixaban  PTA, which has been on hold for laparoscopic right colectomy by Dr. Lyndel on 10/07/24. Last dose on  10/14. Per surgery, ok to start heparin  with no bolus as Hgb has been stable.  Today, 10/09/24 Confirmatory heparin  level 0.54- remains  therapeutic on IV heparin  1100 units/hr CBC: ordered but not done today No bleeding or infusion related concerns reported by RN  Goal of Therapy:  Heparin  level 0.3-0.7 units/ml Monitor platelets by anticoagulation protocol: Yes   Plan No bolus per consult Continue heparin  infusion at 1100 units/hr  Daily CBC & heparin  level while on heparin  Monitor for signs and symptoms of bleeding F/u when CCS feels safe to resume apixaban   Rosaline IVAR Edison, Pharm.D Use secure chat for questions 10/09/2024 9:01 AM

## 2024-10-09 NOTE — Plan of Care (Signed)

## 2024-10-09 NOTE — Progress Notes (Signed)
 PHARMACY - ANTICOAGULATION CONSULT NOTE  Pharmacy Consult for Eliquis  Indication: atrial fibrillation  No Known Allergies  Patient Measurements: Height: 5' 3 (160 cm) Weight: 100.2 kg (220 lb 14.4 oz) IBW/kg (Calculated) : 52.4 HEPARIN  DW (KG): 76.9  Vital Signs: Temp: 98.2 F (36.8 C) (10/22 0635) BP: 125/54 (10/22 0635) Pulse Rate: 63 (10/22 0635)  Labs: Recent Labs    10/07/24 0444 10/07/24 0815 10/08/24 0531 10/08/24 1254 10/08/24 2219 10/09/24 0555 10/09/24 1214 10/09/24 1720  HGB  --    < > 8.7* 9.2*  --   --  8.5* 9.2*  HCT  --    < > 31.2* 33.4*  --   --  30.5* 33.0*  PLT  --    < > 258 275  --   --  251 306  APTT  --   --   --   --  62*  --   --   --   HEPARINUNFRC  --   --   --   --  0.51 0.54  --   --   CREATININE 1.47*  --  1.46*  --   --   --  1.44*  --    < > = values in this interval not displayed.    Estimated Creatinine Clearance: 36.9 mL/min (A) (by C-G formula based on SCr of 1.44 mg/dL (H)).  Assessment: Pharmacy is consulted to start heparin  for 78 yo female with PMH of atrial fibrillation. Pt on apixaban  PTA, which has been on hold for laparoscopic right colectomy by Dr. Lyndel on 10/07/24. Last dose on  10/14. Per surgery, ok to start heparin  with no bolus as Hgb has been stable.  Pharmacy now consulted to resume Eliquis .    Plan Stop heparin  infusion Resume Eliquis  5 mg BID Monitor for signs and symptoms of bleeding   Stefano MARLA Bologna, PharmD, BCPS Clinical Pharmacist 10/09/2024 6:11 PM

## 2024-10-09 NOTE — Progress Notes (Signed)
 2 Days Post-Op  Subjective: Passing flatus, having bowel function.  Objective: Vital signs in last 24 hours: Temp:  [97.9 F (36.6 C)-98.4 F (36.9 C)] 98.2 F (36.8 C) (10/22 0635) Pulse Rate:  [63-76] 63 (10/22 0635) Resp:  [15-20] 18 (10/22 0635) BP: (125-128)/(54-55) 125/54 (10/22 0635) SpO2:  [91 %-99 %] 98 % (10/22 0635) Weight:  [100.2 kg] 100.2 kg (10/22 0635) Last BM Date : 10/05/24  Intake/Output from previous day: 10/21 0701 - 10/22 0700 In: 21.8 [I.V.:21.8] Out: 400 [Urine:400] Intake/Output this shift: No intake/output data recorded.  PE: Gen:  Alert, NAD, pleasant Pulm:  Rate and effort normal, on o2.  Abd: Soft, mild distension, some right sided ttp as well as appropriately tender around incisions, no rigidity or guarding and otherwise NT. Laparoscopic incisions with glue intact appears well and are without drainage, bleeding, or signs of infection. Midline incisions with glue intact with some bruising - no drainage, bleeding, or signs of infection.  GU: Foley in place with straw colored urine in foley bag - 250cc in the bag currently. Will check with RN on I/O.  Psych: A&Ox3   Lab Results:  Recent Labs    10/08/24 0531 10/08/24 1254  WBC 7.3 9.3  HGB 8.7* 9.2*  HCT 31.2* 33.4*  PLT 258 275   BMET Recent Labs    10/07/24 0444 10/07/24 0815 10/08/24 0531  NA 140 140 138  K 4.4 3.8 4.2  CL 108  --  107  CO2 24  --  22  GLUCOSE 86  --  100*  BUN 11  --  12  CREATININE 1.47*  --  1.46*  CALCIUM 11.9*  --  11.3*   PT/INR No results for input(s): LABPROT, INR in the last 72 hours. CMP     Component Value Date/Time   NA 138 10/08/2024 0531   NA 144 09/21/2023 1341   K 4.2 10/08/2024 0531   CL 107 10/08/2024 0531   CO2 22 10/08/2024 0531   GLUCOSE 100 (H) 10/08/2024 0531   BUN 12 10/08/2024 0531   BUN 33 (H) 09/21/2023 1341   CREATININE 1.46 (H) 10/08/2024 0531   CALCIUM 11.3 (H) 10/08/2024 0531   PROT 6.1 (L) 10/03/2024 0545    PROT 6.1 09/21/2023 1341   ALBUMIN 3.9 10/03/2024 0545   ALBUMIN 3.6 (L) 09/21/2023 1341   AST 27 10/03/2024 0545   ALT 14 10/03/2024 0545   ALKPHOS 76 10/03/2024 0545   BILITOT 1.3 (H) 10/03/2024 0545   BILITOT 0.4 09/21/2023 1341   GFRNONAA 37 (L) 10/08/2024 0531   GFRAA 40 (L) 01/29/2021 1006   Lipase  No results found for: LIPASE  Studies/Results: No results found.  Anti-infectives: Anti-infectives (From admission, onward)    Start     Dose/Rate Route Frequency Ordered Stop   10/07/24 0600  cefoTEtan (CEFOTAN) 2 g in sodium chloride  0.9 % 100 mL IVPB        2 g 200 mL/hr over 30 Minutes Intravenous On call to O.R. 10/07/24 0015 10/07/24 0833   10/04/24 1400  neomycin (MYCIFRADIN) tablet 1,000 mg       Placed in And Linked Group   1,000 mg Oral 3 times per day 10/07/24 0015 10/05/24 1359   10/04/24 1400  metroNIDAZOLE (FLAGYL) tablet 1,000 mg       Placed in And Linked Group   1,000 mg Oral 3 times per day 10/07/24 0015 10/05/24 1359        Assessment/Plan POD 1  s/p Laparoscopic right colectomy by Dr. Lyndel on 10/07/24 for Bleeding hepatic flexure colon mass  - Path pending. Colonoscopy path with Tubular adenoma without high-grade dysplasia or malignancy  - CEA pre-op wnl. CT CAP done with tracheosophageal lymph node and Multiple pulmonary nodules  - Regular diet - Okay for anticoagulation - Okay for discharge if tolerating diet - maybe plan for tomorrow  FEN - Reg VTE - SCDs, Okay to restart eliquis  ID - Cefotetan peri-op, none currently.  Foley - Removed    LOS: 8 days    Deward JINNY Lyndel, MD [facility] 10/09/2024, 9:20 AM Please see Amion for pager number during day hours 7:00am-4:30pm

## 2024-10-10 DIAGNOSIS — D649 Anemia, unspecified: Secondary | ICD-10-CM | POA: Diagnosis not present

## 2024-10-10 LAB — BASIC METABOLIC PANEL WITH GFR
Anion gap: 9 (ref 5–15)
BUN: 20 mg/dL (ref 8–23)
CO2: 23 mmol/L (ref 22–32)
Calcium: 11.3 mg/dL — ABNORMAL HIGH (ref 8.9–10.3)
Chloride: 106 mmol/L (ref 98–111)
Creatinine, Ser: 1.43 mg/dL — ABNORMAL HIGH (ref 0.44–1.00)
GFR, Estimated: 38 mL/min — ABNORMAL LOW (ref >60)
Glucose, Bld: 111 mg/dL — ABNORMAL HIGH (ref 70–99)
Potassium: 3.9 mmol/L (ref 3.5–5.1)
Sodium: 138 mmol/L (ref 135–145)

## 2024-10-10 LAB — CBC
HCT: 29.8 % — ABNORMAL LOW (ref 36.0–46.0)
Hemoglobin: 8.4 g/dL — ABNORMAL LOW (ref 12.0–15.0)
MCH: 23.3 pg — ABNORMAL LOW (ref 26.0–34.0)
MCHC: 28.2 g/dL — ABNORMAL LOW (ref 30.0–36.0)
MCV: 82.8 fL (ref 80.0–100.0)
Platelets: 262 K/uL (ref 150–400)
RBC: 3.6 MIL/uL — ABNORMAL LOW (ref 3.87–5.11)
RDW: 25 % — ABNORMAL HIGH (ref 11.5–15.5)
WBC: 7.5 K/uL (ref 4.0–10.5)
nRBC: 0 % (ref 0.0–0.2)

## 2024-10-10 LAB — SURGICAL PATHOLOGY

## 2024-10-10 LAB — MAGNESIUM: Magnesium: 2 mg/dL (ref 1.7–2.4)

## 2024-10-10 MED ORDER — METHOCARBAMOL 500 MG PO TABS: 500.0000 mg | ORAL_TABLET | Freq: Four times a day (QID) | ORAL | 0 refills | Status: DC | PRN

## 2024-10-10 MED ORDER — PANTOPRAZOLE SODIUM 40 MG PO TBEC: DELAYED_RELEASE_TABLET | ORAL | 1 refills | Status: DC

## 2024-10-10 MED ORDER — OXYCODONE HCL 5 MG PO TABS: 5.0000 mg | ORAL_TABLET | Freq: Four times a day (QID) | ORAL | 0 refills | Status: AC | PRN

## 2024-10-10 MED ORDER — SIMETHICONE 80 MG PO CHEW: 80.0000 mg | CHEWABLE_TABLET | Freq: Four times a day (QID) | ORAL | 0 refills | Status: DC | PRN

## 2024-10-10 MED ORDER — ONDANSETRON HCL 4 MG PO TABS: 4.0000 mg | ORAL_TABLET | Freq: Four times a day (QID) | ORAL | 0 refills | Status: DC | PRN

## 2024-10-10 MED ORDER — DOCUSATE SODIUM 100 MG PO CAPS: 100.0000 mg | ORAL_CAPSULE | Freq: Two times a day (BID) | ORAL | 0 refills | Status: AC

## 2024-10-10 NOTE — Plan of Care (Signed)
  Problem: Education: Goal: Knowledge of General Education information will improve Description: Including pain rating scale, medication(s)/side effects and non-pharmacologic comfort measures Outcome: Progressing   Problem: Clinical Measurements: Goal: Ability to maintain clinical measurements within normal limits will improve Outcome: Progressing Goal: Will remain free from infection Outcome: Progressing   Problem: Activity: Goal: Risk for activity intolerance will decrease Outcome: Progressing   Problem: Nutrition: Goal: Adequate nutrition will be maintained Outcome: Progressing   Problem: Elimination: Goal: Will not experience complications related to bowel motility Outcome: Progressing Goal: Will not experience complications related to urinary retention Outcome: Progressing

## 2024-10-10 NOTE — Progress Notes (Signed)
 3 Days Post-Op  Subjective: Feeling good  Objective: Vital signs in last 24 hours: Temp:  [97.9 F (36.6 C)] 97.9 F (36.6 C) (10/23 0606) Pulse Rate:  [65-69] 69 (10/23 0606) Resp:  [18] 18 (10/23 0606) BP: (98-121)/(57-58) 98/57 (10/23 0606) SpO2:  [90 %-94 %] 94 % (10/23 0606) Weight:  [102.8 kg] 102.8 kg (10/23 0606) Last BM Date : 10/09/24  Intake/Output from previous day: 10/22 0701 - 10/23 0700 In: 769.9 [P.O.:480; I.V.:289.9] Out: -  Intake/Output this shift: No intake/output data recorded.  PE: Gen:  Alert, NAD, pleasant Pulm:  Rate and effort normal, on o2.  Abd: Soft, mild distension, some right sided ttp as well as appropriately tender around incisions, no rigidity or guarding and otherwise NT. Laparoscopic incisions with glue intact appears well and are without drainage, bleeding, or signs of infection. Midline incisions with glue intact with some bruising - no drainage, bleeding, or signs of infection.  GU: Voiding  Psych: A&Ox3   Lab Results:  Recent Labs    10/09/24 1720 10/10/24 0543  WBC 10.1 7.5  HGB 9.2* 8.4*  HCT 33.0* 29.8*  PLT 306 262   BMET Recent Labs    10/09/24 1214 10/10/24 0543  NA 137 138  K 4.2 3.9  CL 106 106  CO2 22 23  GLUCOSE 104* 111*  BUN 18 20  CREATININE 1.44* 1.43*  CALCIUM 11.3* 11.3*   PT/INR No results for input(s): LABPROT, INR in the last 72 hours. CMP     Component Value Date/Time   NA 138 10/10/2024 0543   NA 144 09/21/2023 1341   K 3.9 10/10/2024 0543   CL 106 10/10/2024 0543   CO2 23 10/10/2024 0543   GLUCOSE 111 (H) 10/10/2024 0543   BUN 20 10/10/2024 0543   BUN 33 (H) 09/21/2023 1341   CREATININE 1.43 (H) 10/10/2024 0543   CALCIUM 11.3 (H) 10/10/2024 0543   PROT 6.1 (L) 10/03/2024 0545   PROT 6.1 09/21/2023 1341   ALBUMIN 3.9 10/03/2024 0545   ALBUMIN 3.6 (L) 09/21/2023 1341   AST 27 10/03/2024 0545   ALT 14 10/03/2024 0545   ALKPHOS 76 10/03/2024 0545   BILITOT 1.3 (H) 10/03/2024  0545   BILITOT 0.4 09/21/2023 1341   GFRNONAA 38 (L) 10/10/2024 0543   GFRAA 40 (L) 01/29/2021 1006   Lipase  No results found for: LIPASE  Studies/Results: No results found.  Anti-infectives: Anti-infectives (From admission, onward)    Start     Dose/Rate Route Frequency Ordered Stop   10/07/24 0600  cefoTEtan (CEFOTAN) 2 g in sodium chloride  0.9 % 100 mL IVPB        2 g 200 mL/hr over 30 Minutes Intravenous On call to O.R. 10/07/24 0015 10/07/24 0833   10/04/24 1400  neomycin (MYCIFRADIN) tablet 1,000 mg       Placed in And Linked Group   1,000 mg Oral 3 times per day 10/07/24 0015 10/05/24 1359   10/04/24 1400  metroNIDAZOLE (FLAGYL) tablet 1,000 mg       Placed in And Linked Group   1,000 mg Oral 3 times per day 10/07/24 0015 10/05/24 1359        Assessment/Plan POD 3 s/p Laparoscopic right colectomy by Dr. Lyndel on 10/07/24 for Bleeding hepatic flexure colon mass  - Path pending. Colonoscopy path with Tubular adenoma without high-grade dysplasia or malignancy  - CEA pre-op wnl. CT CAP done with tracheosophageal lymph node and Multiple pulmonary nodules  - Regular  diet - Okay for anticoagulation - Okay for discharge   FEN - Reg VTE - SCDs, Okay to restart eliquis  ID - Cefotetan peri-op, none currently.  Foley - Removed    LOS: 9 days    Deward JINNY Foy, MD [facility] 10/10/2024, 9:45 AM Please see Amion for pager number during day hours 7:00am-4:30pm

## 2024-10-10 NOTE — Discharge Instructions (Signed)

## 2024-10-10 NOTE — Progress Notes (Signed)
 Progress Note  Patient Name: Kelli Webster Date of Encounter: 10/10/2024 [facility] Cardiologist: None Rolan  Interval Summary   Feeling fine, nurse in room anticipating discharge.  Vital Signs Vitals:   10/08/24 2303 10/09/24 0635 10/09/24 2232 10/10/24 0606  BP: (!) 127/55 (!) 125/54 (!) 121/58 (!) 98/57  Pulse: 69 63 65 69  Resp: 20 18 18 18   Temp: 98.4 F (36.9 C) 98.2 F (36.8 C) 97.9 F (36.6 C) 97.9 F (36.6 C)  TempSrc:      SpO2: 99% 98% 90% 94%  Weight:  100.2 kg  102.8 kg  Height:        Intake/Output Summary (Last 24 hours) at 10/10/2024 1306 Last data filed at 10/10/2024 0000 Gross per 24 hour  Intake 769.85 ml  Output --  Net 769.85 ml      10/10/2024    6:06 AM 10/09/2024    6:35 AM 10/08/2024    7:17 AM  Last 3 Weights  Weight (lbs) 226 lb 10.1 oz 220 lb 14.4 oz 228 lb 9.9 oz  Weight (kg) 102.8 kg 100.2 kg 103.7 kg      Telemetry/ECG  AF HR 60-70, several AF pauses though < 2s. Occasional PVC - Personally Reviewed  Physical Exam  GEN: No acute distress.   Neck: No JVD Cardiac: Irregularly, irregular, no murmurs, rubs, or gallops.  Respiratory: Clear to auscultation bilaterally. MS: trace right LE edema  Assessment & Plan  Kelli Webster is a 78 y.o. female with a hx of paroxysmal A-Fib, OSA unable to tolerate CPAP, chronic HFrEF, and hyperparathyroidism who presented to the ED on 10/14 due to abnormal labwork at the request of her PCP. She had been reporting fatigue, shortness of breath, lightheadedness over the past 2 weeks  and hgb was found to be 7. FOBT +. GI was consulted and planned for EGD/colonoscopy. Admission echocardiogram showed LVEF was 30-35%, G3DD, normal RV systolic function, severe LAE, mild MR, normal IVC. Cardiology consulted.   HFrEF-NICM Thought to be tachy-mediated QRS duration this admission >150 ms Has declined cMRI historically 2/2 claustrophobia Previously did not handle entresto  2/2 side effects  She  has not required any diuresis this admission.  Net IO Since Admission: 6,870.17 mL [10/10/24 1306] Though no UOP has been charted.  On exam appears, euvolemic and is asymptomatic. Weight is stable from admission.  PTA GDMT: coreg  3.125 mg, farxiga  10 mg, cozaar  25 mg, and spironolactone  25 mg  - pauses are insignificant but coreg  held while in hospital and she was stable.  - losartan  held at time of discharge per primary team.   -continue spironolactone  25 mg Farxgia set to restart on 10/23 Coreg  on hold as above. Will hold on re-starting cozaar  today given BP readings in hospital.  - needs close f/u for med adjustments, I have contacted [facility] RN.  Persistent atrial fibrillation OSA not on CPAP [intolerant] Patient has been cardioverted twice historically, has not remained in NSR.  Unable to DCCV/pursue rhythm control at this time given inability to anticoagulate.  Telemetry did show AF pause, though less than <2 s and infrequent since stopping coreg . She remained asymptomatic.   Continue amiodarone  200 mg  - resumed eliquis .  Keep K > 4 and Mag > 2.   Per primary Symptomatic blood loss anemia. Status post EGD colonoscopy on 10/16. Colon cancer s/p colectomy  Severe gastritis. Primary hyperparathyroidism. Mild hypercalcemia.    For questions or updates, please contact [facility] Please consult [email or web address] for contact  info under       Signed, Farah Lepak A Jaylani Mcguinn, MD

## 2024-10-10 NOTE — Discharge Summary (Signed)
 Physician Discharge Summary   Patient: Kelli Webster MRN: [id number] DOB: [birth year]  Admit date:     10/01/2024  Discharge date: 10/10/24  Discharge Physician: Yetta Blanch  PCP: [facility]  Recommendations at discharge: Follow-up with PCP in 1 week. Follow-up with GI as recommended. Follow-up with surgery for wound check.   Follow-up Information     Brahmbhatt, Parag, MD. Schedule an appointment as soon as possible for a visit in 3 month(s).   Specialty: Gastroenterology Why: Follow-up for colon mass Contact information: [address] [place] [address] [phone]         [facility]. Schedule an appointment as soon as possible for a visit in 1 week(s).   Specialty: Family Medicine Why: with BMP lab to look at kidney/electrolyte numbers, with CBC lab to look at blood counts Contact information: [address] [place] [address] [phone]         Stechschulte, Deward PARAS, MD. Schedule an appointment as soon as possible for a visit in 1 month(s).   Specialty: Surgery Contact information: [address] [place] [address] [phone]         Eletha Boas, MD Follow up.   Specialty: General Surgery Why: As needed Contact information: [address] [place] [address] [phone]                Hospital Course: Patient with PMH of Chronic combined CHF, Eliquis  for A-fib, CKD 3B, OSA, HTN, primary hyperparathyroidism with recently scheduled outpatient surgery, obesity Presented to the hospital with complaints of abnormal lab. Received 1 PRBC transfusion.  [facility] consulted.  Underwent colonoscopy found to have a hepatic mass.  Underwent colectomy for the mass.  Biopsy still pending.  Assessment and Plan: Symptomatic blood loss anemia. Status post EGD colonoscopy on 10/16. Colonic mass. Severe gastritis. Multiple  colonic polyps. Likely chronic in nature. Patient never had a colonoscopy. Hemoglobin was 7.1 upon admission, improved to 9, baseline appears to be around 11 for the patient. Iron level was low which was replaced. Continue with PPI EGD showed severe gastritis and colonoscopy shows hepatic mass.  Biopsy from the endoscopic evaluation negative for any malignancy. Appreciate GI consultation. CT chest abdomen pelvis shows hepatic flexure mass as well as some pulmonary nodules. CEA normal. Appreciate general surgery consultation. Discussed with cardiology, okay to hold anticoagulation for now. Underwent laparoscopic right colectomy on 10/20. Biopsy currently pending from the surgical pathology  Tolerating Eliquis .   Paroxysmal A-fib. On anticoagulation with Eliquis . Currently appears to be rate controlled. Continue amiodarone  and Coreg . Tolerating Eliquis .   Primary hyperparathyroidism. Mild hypercalcemia. Currently scheduled for outpatient procedure.  Surgery will be now postponed until patient recovers from colectomy. Will monitor.   Chronic combined systolic and diastolic CHF. Echocardiogram shows EF of 40 to 45% with grade 1 diastolic dysfunction. Does not appear to be severely volume overloaded. Repeat echocardiogram shows worsening EF.  Appreciate cardio consultation.  No further workup right now. Will monitor.   Obesity Class 1 Body mass index is 40.15 kg/m.  Placing the pt at higher risk of poor outcomes.  Normal  Pain control - [place]  Controlled Substance Reporting System database was reviewed. and patient was instructed, not to drive, operate heavy machinery, perform activities at heights, swimming or participation in water activities or provide baby-sitting services while on Pain, Sleep and Anxiety Medications; until their outpatient Physician has advised to do so  again. Also recommended to not to take more than prescribed Pain, Sleep and Anxiety Medications.   Consultants:  GI Cardiology General surgery  Procedures performed:  Colonoscopy.  EGD Laparoscopic right colectomy  DISCHARGE MEDICATION: Allergies as of 10/10/2024   No Known Allergies      Medication List     STOP taking these medications    carvedilol  3.125 MG tablet Commonly known as: COREG    losartan  25 MG tablet Commonly known as: COZAAR        TAKE these medications    amiodarone  200 MG tablet Commonly known as: PACERONE  Take 200 mg Twice daily for 1 week then 200 mg Daily What changed:  how much to take how to take this when to take this additional instructions   Centrum Women Tabs Take 1 tablet by mouth daily with breakfast.   docusate sodium 100 MG capsule Commonly known as: COLACE Take 1 capsule (100 mg total) by mouth 2 (two) times daily.   Eliquis  5 MG Tabs tablet Generic drug: apixaban  TAKE 1 TABLET BY MOUTH TWICE  DAILY   famotidine  20 MG tablet Commonly known as: PEPCID  Take 20 mg by mouth daily as needed for heartburn or indigestion.   Farxiga  10 MG Tabs tablet Generic drug: dapagliflozin  propanediol TAKE 1 TABLET BY MOUTH DAILY BEFORE BREAKFAST.   MAGNESIUM PO Take 1 tablet by mouth at bedtime.   methocarbamol 500 MG tablet Commonly known as: ROBAXIN Take 1 tablet (500 mg total) by mouth every 6 (six) hours as needed for muscle spasms.   ondansetron  4 MG tablet Commonly known as: ZOFRAN  Take 1 tablet (4 mg total) by mouth every 6 (six) hours as needed for nausea.   oxyCODONE 5 MG immediate release tablet Commonly known as: Oxy IR/ROXICODONE Take 1 tablet (5 mg total) by mouth every 6 (six) hours as needed for up to 5 days for moderate pain (pain score 4-6) or severe pain (pain score 7-10).   pantoprazole  40 MG tablet Commonly known as: Protonix  Take 1 tablet (40 mg total) by mouth 2 (two) times daily before a meal for 14 days, THEN 1 tablet (40 mg total) daily for 14 days. Start taking on: October 10, 2024   simethicone  80 MG chewable tablet Commonly known as: MYLICON Chew 1 tablet (80 mg total) by mouth 4 (four) times daily as needed for flatulence.   spironolactone  25 MG tablet Commonly known as: ALDACTONE  Take 1 tablet (25 mg total) by mouth daily. What changed: when to take this   TYLENOL  500 MG tablet Generic drug: acetaminophen  Take 500-1,000 mg by mouth every 6 (six) hours as needed (for pain or headaches).       Disposition: Home Diet recommendation: Cardiac diet  Discharge Exam: Vitals:   10/08/24 2303 10/09/24 0635 10/09/24 2232 10/10/24 0606  BP: (!) 127/55 (!) 125/54 (!) 121/58 (!) 98/57  Pulse: 69 63 65 69  Resp: 20 18 18 18   Temp: 98.4 F (36.9 C) 98.2 F (36.8 C) 97.9 F (36.6 C) 97.9 F (36.6 C)  TempSrc:      SpO2: 99% 98% 90% 94%  Weight:  100.2 kg  102.8 kg  Height:       Basal crackles. S1-S2 present Bowel sound present Trace edema.  Filed Weights   10/08/24 0717 10/09/24 0635 10/10/24 0606  Weight: 103.7 kg 100.2 kg 102.8 kg   Condition at discharge: stable  The results of significant diagnostics from this hospitalization (including imaging, microbiology, ancillary and laboratory) are listed below  for reference.   Imaging Studies: CT CHEST ABDOMEN PELVIS W CONTRAST Result Date: 10/03/2024 EXAM: CT CHEST, ABDOMEN AND PELVIS WITH CONTRAST 10/03/2024 07:39:24 PM TECHNIQUE: CT of the chest, abdomen and pelvis was performed with the administration of 80 mL of iohexol  (OMNIPAQUE ) 300 MG/ML solution. Multiplanar reformatted images are provided for review. Automated exposure control, iterative reconstruction, and/or weight based adjustment of the mA/kV was utilized to reduce the radiation dose to as low as reasonably achievable. COMPARISON: CT of the chest 09/24/2013. CLINICAL HISTORY: Colon cancer, staging. FINDINGS: CHEST: MEDIASTINUM AND LYMPH NODES: Heart and pericardium: Atherosclerotic calcifications of the coronary arteries. Aorta: Atherosclerotic calcifications  of the aorta. The central airways are clear. Enlarged tracheoesophageal lymph node measuring 12 mm short axis at the thoracic inlet, right side, new from prior. No other enlarged lymph nodes are seen. LUNGS AND PLEURA: 6 mm right upper lobe nodule (image 6/70). 4 mm nodule in the left upper lobe (image 6/70). Right apical nodule measuring 8 mm (image 6/39). 2 mm nodule in the right upper lobe (image 6/40). 2 mm nodule in the right lower lobe (image 6/97). No focal consolidation or pulmonary edema. No pleural effusion or pneumothorax. ABDOMEN AND PELVIS: LIVER: The liver is unremarkable. GALLBLADDER AND BILE DUCTS: Small gallstones present. No biliary ductal dilatation. SPLEEN: No acute abnormality. PANCREAS: Punctate calcifications in the head of the pancreas. ADRENAL GLANDS: Bilateral low-density adrenal nodules are present compatible with adenomas. Left adrenal adenoma measures 1.5 x 3.6 cm. Right adrenal adenoma measures 2.6 x 1.3 cm. KIDNEYS, URETERS AND BLADDER: No stones in the kidneys or ureters. No hydronephrosis. No perinephric or periureteral stranding. Urinary bladder is unremarkable. GI AND BOWEL: Stomach demonstrates no acute abnormality. Colonic mass identified near the hepatic flexure measuring 1.8 x 2.9 x 3.6 cm. Appendix is normal. There is no bowel obstruction. REPRODUCTIVE ORGANS: No acute abnormality. PERITONEUM AND RETROPERITONEUM: No ascites. No free air. VASCULATURE: Aorta is normal in caliber. Atherosclerotic calcifications of the aorta and iliac arteries. ABDOMINAL AND PELVIS LYMPH NODES: No lymphadenopathy. BONES AND SOFT TISSUES: Chronic compression deformity of the superior endplate of L3 and T11. No focal soft tissue abnormality. IMPRESSION: 1. Colonic mass near the hepatic flexure measuring 1.8 x 2.9 x 3.6 cm, consistent with colon cancer. 2. Enlarged tracheosophageal lymph node measuring 12 mm short axis at the thoracic and right, new from prior. No other enlarged lymph nodes are seen.  3. Multiple pulmonary nodules, the largest measuring 8 mm in the right upper lobe. Metastatic disease is a possibility given other findings. Electronically signed by: Greig Pique MD 10/03/2024 07:56 PM EDT RP Workstation: [id number]   ECHOCARDIOGRAM COMPLETE Result Date: 10/02/2024    ECHOCARDIOGRAM REPORT   Patient Name:   Kelli Webster Date of Exam: 10/02/2024 Medical Rec #:  [id number]   Height:       63.0 in Accession #:    [id number]  Weight:       221.1 lb Date of Birth:  1946/02/25   BSA:          2.018 m Patient Age:    77 years    BP:           144/63 mmHg Patient Gender: F           HR:           76 bpm. Exam Location:  Inpatient Procedure: 2D Echo (Both Spectral and Color Flow Doppler were utilized during            procedure).  Indications:    CHF  History:        Patient has prior history of Echocardiogram examinations. CHF.  Sonographer:    Norleen Amour Referring Phys: [phone] DALTON S MCLEAN IMPRESSIONS  1. Left ventricular ejection fraction, by estimation, is 30 to 35%. The left ventricle has moderately decreased function. The left ventricle demonstrates global hypokinesis. There is mild left ventricular hypertrophy. Left ventricular diastolic parameters are consistent with Grade III diastolic dysfunction (restrictive).  2. Right ventricular systolic function is normal. The right ventricular size is normal.  3. Left atrial size was severely dilated.  4. The mitral valve is normal in structure. Mild mitral valve regurgitation. No evidence of mitral stenosis.  5. The aortic valve is tricuspid. Aortic valve regurgitation is trivial. No aortic stenosis is present.  6. The inferior vena cava is normal in size with greater than 50% respiratory variability, suggesting right atrial pressure of 3 mmHg. FINDINGS  Left Ventricle: Left ventricular ejection fraction, by estimation, is 30 to 35%. The left ventricle has moderately decreased function. The left ventricle demonstrates global hypokinesis. The left  ventricular internal cavity size was normal in size. There is mild left ventricular hypertrophy. Left ventricular diastolic parameters are consistent with Grade III diastolic dysfunction (restrictive). Right Ventricle: The right ventricular size is normal. Right ventricular systolic function is normal. Left Atrium: Left atrial size was severely dilated. Right Atrium: Right atrial size was normal in size. Pericardium: There is no evidence of pericardial effusion. Mitral Valve: The mitral valve is normal in structure. Mild mitral valve regurgitation. No evidence of mitral valve stenosis. MV peak gradient, 8.4 mmHg. The mean mitral valve gradient is 2.0 mmHg. Tricuspid Valve: The tricuspid valve is normal in structure. Tricuspid valve regurgitation is trivial. No evidence of tricuspid stenosis. Aortic Valve: The aortic valve is tricuspid. Aortic valve regurgitation is trivial. Aortic regurgitation PHT measures 694 msec. No aortic stenosis is present. Aortic valve mean gradient measures 7.5 mmHg. Aortic valve peak gradient measures 16.4 mmHg. Aortic valve area, by VTI measures 1.29 cm. Pulmonic Valve: The pulmonic valve was normal in structure. Pulmonic valve regurgitation is trivial. No evidence of pulmonic stenosis. Aorta: The aortic root is normal in size and structure. Venous: The inferior vena cava is normal in size with greater than 50% respiratory variability, suggesting right atrial pressure of 3 mmHg. IAS/Shunts: No atrial level shunt detected by color flow Doppler.  LEFT VENTRICLE PLAX 2D LVIDd:         6.00 cm      Diastology LVIDs:         4.10 cm      LV e' medial:    7.18 cm/s LV PW:         0.90 cm      LV E/e' medial:  20.6 LV IVS:        0.80 cm      LV e' lateral:   9.57 cm/s LVOT diam:     2.15 cm      LV E/e' lateral: 15.5 LV SV:         47 LV SV Index:   23 LVOT Area:     3.63 cm  LV Volumes (MOD) LV vol d, MOD A2C: 102.0 ml LV vol d, MOD A4C: 117.0 ml LV vol s, MOD A2C: 60.5 ml LV vol s, MOD A4C:  54.0 ml LV SV MOD A2C:     41.5 ml LV SV MOD A4C:     117.0 ml LV SV MOD  BP:      51.3 ml RIGHT VENTRICLE             IVC RV Basal diam:  3.10 cm     IVC diam: 1.80 cm RV S prime:     11.20 cm/s TAPSE (M-mode): 1.7 cm LEFT ATRIUM              Index        RIGHT ATRIUM           Index LA diam:        4.80 cm  2.38 cm/m   RA Area:     18.60 cm LA Vol (A2C):   115.0 ml 56.99 ml/m  RA Volume:   50.30 ml  24.93 ml/m LA Vol (A4C):   97.8 ml  48.47 ml/m LA Biplane Vol: 108.0 ml 53.52 ml/m  AORTIC VALVE                     PULMONIC VALVE AV Area (Vmax):    1.29 cm      PV Vmax:          1.14 m/s AV Area (Vmean):   1.46 cm      PV Peak grad:     5.2 mmHg AV Area (VTI):     1.29 cm      PR End Diast Vel: 7.62 msec AV Vmax:           202.25 cm/s AV Vmean:          128.250 cm/s AV VTI:            0.363 m AV Peak Grad:      16.4 mmHg AV Mean Grad:      7.5 mmHg LVOT Vmax:         72.03 cm/s LVOT Vmean:        51.733 cm/s LVOT VTI:          0.129 m LVOT/AV VTI ratio: 0.36 AI PHT:            694 msec  AORTA Ao Root diam: 2.40 cm Ao Asc diam:  3.20 cm MITRAL VALVE MV Area (PHT): 3.14 cm       SHUNTS MV Area VTI:   1.56 cm       Systemic VTI:  0.13 m MV Peak grad:  8.4 mmHg       Systemic Diam: 2.15 cm MV Mean grad:  2.0 mmHg MV Vmax:       1.45 m/s MV Vmean:      65.5 cm/s MR Peak grad:    113.2 mmHg MR Mean grad:    68.0 mmHg MR Vmax:         532.00 cm/s MR Vmean:        394.0 cm/s MR PISA:         1.01 cm MR PISA Eff ROA: 6 mm MR PISA Radius:  0.40 cm MV E velocity: 148.00 cm/s MV A velocity: 45.90 cm/s MV E/A ratio:  3.22 Redell Shallow MD Electronically signed by Redell Shallow MD Signature Date/Time: 10/02/2024/3:25:28 PM    Final    DG Chest 2 View Result Date: 10/01/2024 EXAM: 2 VIEW(S) XRAY OF THE CHEST 10/01/2024 03:02:00 PM COMPARISON: 09/18/2023 CLINICAL HISTORY: shob. Pt reports that she has low hgb per her provider, pt is having shob. FINDINGS: LUNGS AND PLEURA: No focal pulmonary opacity. No pulmonary  edema. No pleural effusion. No pneumothorax. HEART AND MEDIASTINUM: Stable cardiomegaly. BONES AND SOFT  TISSUES: No acute osseous abnormality. IMPRESSION: 1. Stable cardiomegaly. Electronically signed by: Lynwood Seip MD 10/01/2024 03:14 PM EDT RP Workstation: [id number]    Microbiology: Results for orders placed or performed during the hospital encounter of 02/16/21  SARS CORONAVIRUS 2 (TAT 6-24 HRS) Nasopharyngeal Nasopharyngeal Swab     Status: None   Collection Time: 02/16/21  2:51 PM   Specimen: Nasopharyngeal Swab  Result Value Ref Range Status   SARS Coronavirus 2 NEGATIVE NEGATIVE Final    Comment: (NOTE) SARS-CoV-2 target nucleic acids are NOT DETECTED.  The SARS-CoV-2 RNA is generally detectable in upper and lower respiratory specimens during the acute phase of infection. Negative results do not preclude SARS-CoV-2 infection, do not rule out co-infections with other pathogens, and should not be used as the sole basis for treatment or other patient management decisions. Negative results must be combined with clinical observations, patient history, and epidemiological information. The expected result is Negative.  Fact Sheet for Patients: HairSlick.no  Fact Sheet for Healthcare Providers: quierodirigir.com  This test is not yet approved or cleared by the [place]  FDA and  has been authorized for detection and/or diagnosis of SARS-CoV-2 by FDA under an Emergency Use Authorization (EUA). This EUA will remain  in effect (meaning this test can be used) for the duration of the COVID-19 declaration under Se ction 564(b)(1) of the Act, 21 U.S.C. section 360bbb-3(b)(1), unless the authorization is terminated or revoked sooner.  Performed at [facility], [address], [place], [place] [address]    Labs: CBC: Recent Labs  Lab 10/08/24 0531 10/08/24 1254 10/09/24 1214 10/09/24 1720 10/10/24 0543  WBC 7.3  9.3 8.7 10.1 7.5  NEUTROABS  --   --  6.9  --   --   HGB 8.7* 9.2* 8.5* 9.2* 8.4*  HCT 31.2* 33.4* 30.5* 33.0* 29.8*  MCV 83.6 82.9 83.6 83.8 82.8  PLT 258 275 251 306 262   Basic Metabolic Panel: Recent Labs  Lab 10/06/24 0555 10/07/24 0444 10/07/24 0815 10/08/24 0531 10/09/24 0555 10/09/24 1214 10/10/24 0543  NA 142 140 140 138  --  137 138  K 4.0 4.4 3.8 4.2  --  4.2 3.9  CL 109 108  --  107  --  106 106  CO2 24 24  --  22  --  22 23  GLUCOSE 82 86  --  100*  --  104* 111*  BUN 12 11  --  12  --  18 20  CREATININE 1.42* 1.47*  --  1.46*  --  1.44* 1.43*  CALCIUM 11.8* 11.9*  --  11.3*  --  11.3* 11.3*  MG 1.9 1.8  --  1.7 1.6*  --  2.0   Liver Function Tests: No results for input(s): AST, ALT, ALKPHOS, BILITOT, PROT, ALBUMIN in the last 168 hours. CBG: No results for input(s): GLUCAP in the last 168 hours.  Discharge time spent: greater than 30 minutes.  Author: Yetta Blanch, MD  [facility] 10/10/2024

## 2024-10-10 NOTE — Progress Notes (Signed)
 Physical Therapy Treatment Patient Details Name: Kelli Webster MRN: [id number] DOB: Nov 29, 1946 Today's Date: 10/10/2024   History of Present Illness Pt is 78 yo female admitted on 10/01/24 with symptomatic anemia and found to have hepatic flexure mass.  Pt underwent R colectomy on 10/07/24.    PT Comments  Patient progressing to hallway ambulation though reports R knee injury twisted it in the bed.  States feels weak and related one episode of buckling during ambulation.  Encouraged proximity to walker and using arms for preventing fall as well as benefits of a knee sleeve.  Patient with SpO2 88% after ambulation on RA, improved to 91% within 30 seconds.  Issued and education on use of incentive spirometer.  Patient declined to practice steps reporting has rails and spouse can assist.  Recommend HHPT at d/c.  PT will follow up if not d/c.     If plan is discharge home, recommend the following: A little help with walking and/or transfers;A little help with bathing/dressing/bathroom;Assistance with cooking/housework;Help with stairs or ramp for entrance   Can travel by private vehicle        Equipment Recommendations  None recommended by PT    Recommendations for Other Services       Precautions / Restrictions Precautions Precautions: Fall Recall of Precautions/Restrictions: Intact     Mobility  Bed Mobility               General bed mobility comments: in recliner    Transfers Overall transfer level: Needs assistance Equipment used: Rolling walker (2 wheels) Transfers: Sit to/from Stand Sit to Stand: Contact guard assist           General transfer comment: pushing from chair and rising on her own, though some assist for anterior weight shift; improved with practice x 3 reps    Ambulation/Gait Ambulation/Gait assistance: Contact guard assist Gait Distance (Feet): 65 Feet Assistive device: Rolling walker (2 wheels) Gait Pattern/deviations: Step-through pattern,  Decreased stride length, Trunk flexed, Knees buckling       General Gait Details: one episode R knee buckle self recovery; cues for posture, walker a little too tall   Stairs Stairs:  (declined to practice)           Wheelchair Mobility     Tilt Bed    Modified Rankin (Stroke Patients Only)       Balance Overall balance assessment: Needs assistance   Sitting balance-Leahy Scale: Good     Standing balance support: Bilateral upper extremity supported, Reliant on assistive device for balance                                Communication Communication Communication: No apparent difficulties  Cognition Arousal: Alert     PT - Cognitive impairments: No apparent impairments                         Following commands: Intact      Cueing Cueing Techniques: Verbal cues  Exercises Other Exercises Other Exercises: sit<>stand x 3 reps with cues for hip and knee full extension in standing    General Comments General comments (skin integrity, edema, etc.): spouse present initially; MD in during session and slated for d/c; issued and education on use of incentive spirometer; SpO2 88% after ambulation, up to 90% <30 sec without time to instruct in PLB, issued HEP for sit to stand and written instructions in using  incentive spirometer      Pertinent Vitals/Pain Pain Assessment Faces Pain Scale: Hurts little more Pain Location: R knee Pain Descriptors / Indicators: Sore Pain Intervention(s): Monitored during session    Home Living                          Prior Function            PT Goals (current goals can now be found in the care plan section) Progress towards PT goals: Progressing toward goals    Frequency    Min 2X/week      PT Plan      Co-evaluation              AM-PAC PT 6 Clicks Mobility   Outcome Measure  Help needed turning from your back to your side while in a flat bed without using bedrails?: A  Little Help needed moving from lying on your back to sitting on the side of a flat bed without using bedrails?: A Little Help needed moving to and from a bed to a chair (including a wheelchair)?: A Little Help needed standing up from a chair using your arms (e.g., wheelchair or bedside chair)?: A Little Help needed to walk in hospital room?: A Little Help needed climbing 3-5 steps with a railing? : A Little 6 Click Score: 18    End of Session Equipment Utilized During Treatment: Gait belt Activity Tolerance: Patient limited by fatigue Patient left: in chair;with call bell/phone within reach   PT Visit Diagnosis: Other abnormalities of gait and mobility (R26.89);Muscle weakness (generalized) (M62.81)     Time: 8894-8866 PT Time Calculation (min) (ACUTE ONLY): 28 min  Charges:    $Gait Training: 8-22 mins $Therapeutic Activity: 8-22 mins PT General Charges $$ ACUTE PT VISIT: 1 Visit                     Micheline Webster, PT Acute Rehabilitation Services Office:[phone] 10/10/2024    Kelli Webster 10/10/2024, 1:04 PM

## 2024-10-14 ENCOUNTER — Telehealth (HOSPITAL_COMMUNITY): Payer: Self-pay | Admitting: Cardiology

## 2024-10-14 NOTE — Telephone Encounter (Signed)
 Patient called to notify provider of recent hospitalization   Would like to know if DCCv should be done before or after thyroid  surgery after recent hospital visit/adb surgery -originally DC CV 10/29 AFTER thyroid  surgery however hospitalization has caused patient to push surgery back  Will cancel 10/29   Would like to know if she restart eliquis  and jardiance?     Would like to know if she should restart diuretic? -was told to stop spiro in the hospital however its listed on d/c summary   Please advise

## 2024-10-14 NOTE — Telephone Encounter (Signed)
 She should restart her pre-hospital medication regimen, same as prior to admission.  When will her parathyroid  surgery be? Need to get her seen in our office by APP either this week or next week.

## 2024-10-15 MED ORDER — FUROSEMIDE 20 MG PO TABS: 20.0000 mg | ORAL_TABLET | Freq: Every day | ORAL | 11 refills | Status: AC

## 2024-10-15 NOTE — Telephone Encounter (Signed)
 Pt aware.

## 2024-10-15 NOTE — Telephone Encounter (Signed)
 Patient aware and voiced understanding  -will restart all pre admission medications   Reports she was advised to wait 4 weeks before rescheduling parathyroid  procedure.    DCCV/TEE cancelled with [facility]  Patient pushed HFU appt out to 11/10, unable to keep 10/29 due to increased weakness   Still requests diuretic to assist with fluid retention, reports fluid has been present since discharge. Reports spiro alone will not be enough. -please advise

## 2024-10-15 NOTE — Telephone Encounter (Signed)
 She can start Lasix  20 mg daily with BMET in 1 week.  I would encourage her to come to appt on 10/29.  Increased weakness is a good reason to be evaluated.  Maybe she could have someone bring her in?

## 2024-10-16 ENCOUNTER — Encounter (HOSPITAL_COMMUNITY): Admission: RE | Payer: Self-pay | Source: Home / Self Care

## 2024-10-16 ENCOUNTER — Ambulatory Visit (HOSPITAL_COMMUNITY): Admission: RE | Admit: 2024-10-16 | Source: Home / Self Care | Admitting: Cardiology

## 2024-10-16 ENCOUNTER — Ambulatory Visit (HOSPITAL_COMMUNITY)

## 2024-10-16 LAB — SURGICAL PATHOLOGY

## 2024-10-16 SURGERY — CARDIOVERSION (CATH LAB)
Anesthesia: General

## 2024-10-22 ENCOUNTER — Ambulatory Visit (HOSPITAL_COMMUNITY)

## 2024-10-23 ENCOUNTER — Other Ambulatory Visit: Payer: Self-pay | Admitting: Cardiology

## 2024-10-24 NOTE — Telephone Encounter (Signed)
 This is a CHF pt

## 2024-10-28 ENCOUNTER — Ambulatory Visit (HOSPITAL_COMMUNITY)
Admission: RE | Admit: 2024-10-28 | Discharge: 2024-10-28 | Disposition: A | Discharge status: Home / Self Care | Source: Ambulatory Visit | Attending: Cardiology | Admitting: Cardiology

## 2024-10-28 ENCOUNTER — Encounter (HOSPITAL_COMMUNITY): Payer: Self-pay

## 2024-10-28 VITALS — BP 120/70 | HR 80 | Ht 63.0 in | Wt 205.6 lb

## 2024-10-28 DIAGNOSIS — G4733 Obstructive sleep apnea (adult) (pediatric): Secondary | ICD-10-CM | POA: Insufficient documentation

## 2024-10-28 DIAGNOSIS — Z6836 Body mass index (BMI) 36.0-36.9, adult: Secondary | ICD-10-CM | POA: Insufficient documentation

## 2024-10-28 DIAGNOSIS — I5022 Chronic systolic (congestive) heart failure: Secondary | ICD-10-CM | POA: Diagnosis present

## 2024-10-28 DIAGNOSIS — I480 Paroxysmal atrial fibrillation: Secondary | ICD-10-CM | POA: Insufficient documentation

## 2024-10-28 DIAGNOSIS — E669 Obesity, unspecified: Secondary | ICD-10-CM | POA: Insufficient documentation

## 2024-10-28 DIAGNOSIS — I428 Other cardiomyopathies: Secondary | ICD-10-CM | POA: Insufficient documentation

## 2024-10-28 DIAGNOSIS — I4819 Other persistent atrial fibrillation: Secondary | ICD-10-CM | POA: Insufficient documentation

## 2024-10-28 DIAGNOSIS — I5042 Chronic combined systolic (congestive) and diastolic (congestive) heart failure: Secondary | ICD-10-CM | POA: Diagnosis not present

## 2024-10-28 DIAGNOSIS — E213 Hyperparathyroidism, unspecified: Secondary | ICD-10-CM | POA: Diagnosis not present

## 2024-10-28 DIAGNOSIS — I4892 Unspecified atrial flutter: Secondary | ICD-10-CM | POA: Insufficient documentation

## 2024-10-28 DIAGNOSIS — Z79899 Other long term (current) drug therapy: Secondary | ICD-10-CM | POA: Insufficient documentation

## 2024-10-28 DIAGNOSIS — Z8616 Personal history of COVID-19: Secondary | ICD-10-CM | POA: Insufficient documentation

## 2024-10-28 DIAGNOSIS — Z85038 Personal history of other malignant neoplasm of large intestine: Secondary | ICD-10-CM | POA: Diagnosis not present

## 2024-10-28 LAB — CBC
HCT: 38.3 % (ref 36.0–46.0)
Hemoglobin: 11.1 g/dL — ABNORMAL LOW (ref 12.0–15.0)
MCH: 24 pg — ABNORMAL LOW (ref 26.0–34.0)
MCHC: 29 g/dL — ABNORMAL LOW (ref 30.0–36.0)
MCV: 82.7 fL (ref 80.0–100.0)
Platelets: 463 K/uL — ABNORMAL HIGH (ref 150–400)
RBC: 4.63 MIL/uL (ref 3.87–5.11)
WBC: 9.1 K/uL (ref 4.0–10.5)
nRBC: 0 % (ref 0.0–0.2)

## 2024-10-28 LAB — BASIC METABOLIC PANEL WITH GFR
Anion gap: 11 (ref 5–15)
BUN: 26 mg/dL — ABNORMAL HIGH (ref 8–23)
CO2: 28 mmol/L (ref 22–32)
Calcium: 11.6 mg/dL — ABNORMAL HIGH (ref 8.9–10.3)
Chloride: 102 mmol/L (ref 98–111)
Creatinine, Ser: 2.22 mg/dL — ABNORMAL HIGH (ref 0.44–1.00)
GFR, Estimated: 22 mL/min — ABNORMAL LOW (ref >60)
Glucose, Bld: 138 mg/dL — ABNORMAL HIGH (ref 70–99)
Potassium: 4 mmol/L (ref 3.5–5.1)
Sodium: 141 mmol/L (ref 135–145)

## 2024-10-28 NOTE — Patient Instructions (Addendum)
 Good to see you today!  Labs done today, your results will be available in MyChart, we will contact you for abnormal readings.  Your physician recommends that you schedule a follow-up appointment 2 months(January) Call office in beginning of December to schedule an appointment  If you have any questions or concerns before your next appointment please send us  a message through [facility] or call our office at [phone].    TO LEAVE A MESSAGE FOR THE NURSE SELECT OPTION 2, PLEASE LEAVE A MESSAGE INCLUDING: YOUR NAME DATE OF BIRTH CALL BACK NUMBER REASON FOR CALL**this is important as we prioritize the call backs  YOU WILL RECEIVE A CALL BACK THE SAME DAY AS LONG AS YOU CALL BEFORE 4:00 PM At the [facility], you and your health needs are our priority. As part of our continuing mission to provide you with exceptional heart care, we have created designated Provider Care Teams. These Care Teams include your primary Cardiologist (physician) and Advanced Practice Providers (APPs- Physician Assistants and Nurse Practitioners) who all work together to provide you with the care you need, when you need it.   You may see any of the following providers on your designated Care Team at your next follow up: Dr Toribio Fuel Dr Ezra Shuck Dr. Morene Brownie Greig Mosses, NP Caffie Shed, [place] [facility] [place], [place] Beckey Coe, NP Jordan Lee, NP Ellouise Class, NP Tinnie Redman, PharmD Jaun Bash, PharmD   Please be sure to bring in all your medications bottles to every appointment.    Thank you for choosing [facility]

## 2024-10-28 NOTE — Progress Notes (Signed)
 Patient ID: Kelli Webster, female   DOB: [birth year], 78 y.o.   MRN: [id number]  PCP: Dr. Tanda EP: Dr Kelsie HF MD: Dr Rolan.   Chief complaint: CHF  78 y.o. woman with h/o obesity, PAF, moderate OSA (cannot tolerate CPAP) and systolic HF.  She was admitted in 09/2013 with severe dyspnea for several days and was found to have atrial fibrillation with RVR and volume overload.  EF 20-25% on echo, diffuse hypokinesis.  She was cardioverted twice but both times went back into atrial fibrillation, the 2nd time on amiodarone .    She had an atrial fibrillation ablation in 12/14 that was successful.  TEE in 12/14 showed EF 30%.  Repeat echo in 4/15 showed EF up to 45%.  Echo in 6/17 showed EF down to 25-30%.  Echo in 6/18 showed EF back up to 45-50%.  In 3/20 she had recurrent atrial fibrillation and was cardioverted.  In 11/20, echo showed EF 40-45% with normal RV.   She was cardioverted again in 12/21 and 1/22.  In 3/22, she had redo atrial fibrillation ablation.  However, she remained in atypical atrial flutter and Dr. Kelsie stopped her amiodarone  in 8/22.  She had COVID-19 in 8/22.   She was in atypical flutter at follow up 11/22. Started on amiodarone  and underwent successful DCCV 11/22.  Echo 12/23 showed EF 40% with diffuse hypokinesis, mildly decreased RV systolic function, moderate functional MR, PASP 54.   In 9/24, she felt her heart start racing again and noted HR up to 170s.  HR was up and down for about 2 days and she felt some lightheadedness.  She came by the office and was noted to be in rapid atrial flutter.  She was sent to the ER.  She was cardioverted back to NSR in the ER.  She was noted to have elevated BNP, HS-TnI, LFTs and creatinine to 3.09 in setting of hypotension and atrial flutter/RVR.  Coreg , losartan , and Farxiga  were stopped.     11/24 echo showed EF 40-45%, normal RV, mild MR.   Seen in clinic 10/25 for the first time in over a year. She was noted to be back in  Afib but unaware/ asymptomatic. She had also reported being recently diagnosed w/ hyperparathyroidism and was waiting to undergo parathyroidectomy. Dr. Rolan increased amiodarone  to 200 mg bid x 1 week then back to 200 mg daily and referred back to EP for consideration for redo ablation. We also ordered repeat echo. Plan was to bring back in 1-2 wks for repeat EKG and set up for outpatient cardioversion if still in Afib.   Unfortunately, she ended up getting admitted for abnormal pre-op labs (for parathyroidectomy) that showed severe anemia and Hgb of 7. She was found to be FOBT +. GI was consulted and planned for EGD/colonoscopy. Eliquis  held. Gen cards was consulted for pre-op clearance. Echo was subsequently done and showed EF LVEF was 30-35%, G3DD, normal RV systolic function, severe LAE, mild MR, normal IVC. She was cleared to undergo further GI w/u and was discovered to have a colon mass on colonoscopy and underwent laparoscopic colectomy. Luckily, pathology was benign. She was ultimately restarted on Eliquis  and tolerated ok w/ stable H/H. Telemetry did show AF pause, though less than <2 s. Coreg  was discontinued. She was continued on amiodarone  200 mg daily    She presents today for f/u. Overall she has been feeling better. Denies resting dyspnea. Legs feel weak and has been ambulating w/ a walker but denies  any significant dyspnea w/ basic ADLs and walking around her house. She denies any gross bleeding w/  Eliquis . She feels that her energy levels are gradually improving. Her EKG today shows persistent Afib w/ CVR in the 70s. She reports that she did restart her Coreg  and has been tolerating well. No fatigue, lightlessness, dizziness, syncope/ near syncope.   We discussed setting up for cardioversion in several wks, but she would prefer to wait until after her parathyroidectomy, given absence of symptoms and being rate controlled.   ECG (personally reviewed):  persistent Afib 72 bpm   Labs  (9/24): AST 234, ALT 203, BNP 2317, Hs-TnI 960, TSH normal, creatinine 3.08 Labs (10/24): creatinine 1.55, K 5.3 Labs (12/24): K 5, creatinine 1.58   PMH:  1. Obesity 2. GERD 3. OA 4. EGD negative in 10/14 5. ACEI cough 6. Atrial fibrillation: First diagnosed in 10/14.  DCCV to NSR in 09/2013 but atrial fibrillation recurred. Cardioversion 10/18/2013 successful on amiodarone  but back in atrial fibrillation in 11/14.  Atrial fibrillation ablation ([facility]) 12/14.  - DCCV to NSR in 3/20, 12/21, 1/22 - Redo atrial fibrillation ablation in 3/22.   7. Cardiomyopathy: Echo (10/14) with EF 20-25%, diffuse HK.  LHC (10/14) showed no CAD.  TSH normal, HIV negative, SPEP/UPEP negative.  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  TEE (12/14) with EF 30%, diffuse hypokinesis, mild MR, mildly decreased RV systolic function.  Echo (4/15) with EF 45%, diffuse hypokinesis, mildly dilated LV, normal RV size and systolic function.  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 - Echo (6/17) with EF 25-30%, mild MR.  - Echo (6/18) with EF 45-50%, mildly dilated LV, mild MR.  - Echo (11/20) with EF 40-45%, normal RV.  - Echo (11/22): EF 35-40%, RV normal, mild MR.  - Echo (12/23): EF 40% with diffuse hypokinesis, mildly decreased RV systolic function, moderate functional MR, PASP 54, IVC normal.  - Echo (11/24): EF 40-45%, RV normal, mild MR.  8. Sinus bradycardia 9. OSA: Cannot tolerate CPAP. 10. CKD stage 3 11. Low back pain/sciatica.  12. COVID-19 8/22 13. Fe deficiency anemia 14. Atrial flutter: Atypical.  DCCV in 9/24.  15. Hyperparathyroidism 16. Colon Cancer: s/p curative laparoscopic hemicolectomy   SH: Married, lives in [place], nonsmoker.   FH: No history of cardiomyopathy or premature CAD.   ROS: All systems reviewed and negative except as per HPI.   Current Outpatient Medications  Medication Sig Dispense Refill   amiodarone  (PACERONE ) 200 MG tablet TAKE 1 TABLET BY MOUTH EVERY DAY 90 tablet 6    carvedilol  (COREG ) 3.125 MG tablet Take 3.125 mg by mouth 2 (two) times daily with a meal.     docusate sodium (COLACE) 100 MG capsule Take 1 capsule (100 mg total) by mouth 2 (two) times daily. 10 capsule 0   ELIQUIS  5 MG TABS tablet TAKE 1 TABLET BY MOUTH TWICE  DAILY 180 tablet 3   famotidine  (PEPCID ) 20 MG tablet Take 20 mg by mouth daily as needed for heartburn or indigestion.     FARXIGA  10 MG TABS tablet TAKE 1 TABLET BY MOUTH DAILY BEFORE BREAKFAST. 30 tablet 11   furosemide  (LASIX ) 20 MG tablet Take 1 tablet (20 mg total) by mouth daily. 30 tablet 11   losartan  (COZAAR ) 25 MG tablet Take 25 mg by mouth daily.     MAGNESIUM PO Take 1 tablet by mouth at bedtime.     Multiple Vitamins-Minerals (CENTRUM WOMEN) TABS Take 1 tablet by mouth daily with breakfast.  pantoprazole  (PROTONIX ) 40 MG tablet Take 1 tablet (40 mg total) by mouth 2 (two) times daily before a meal for 14 days, THEN 1 tablet (40 mg total) daily for 14 days. 30 tablet 1   spironolactone  (ALDACTONE ) 25 MG tablet Take 1 tablet (25 mg total) by mouth daily. (Patient taking differently: Take 25 mg by mouth at bedtime.) 90 tablet 3   TYLENOL  500 MG tablet Take 500-1,000 mg by mouth every 6 (six) hours as needed (for pain or headaches).     methocarbamol (ROBAXIN) 500 MG tablet Take 1 tablet (500 mg total) by mouth every 6 (six) hours as needed for muscle spasms. (Patient not taking: Reported on 10/28/2024) 30 tablet 0   ondansetron  (ZOFRAN ) 4 MG tablet Take 1 tablet (4 mg total) by mouth every 6 (six) hours as needed for nausea. (Patient not taking: Reported on 10/28/2024) 20 tablet 0   simethicone (MYLICON) 80 MG chewable tablet Chew 1 tablet (80 mg total) by mouth 4 (four) times daily as needed for flatulence. (Patient not taking: Reported on 10/28/2024) 30 tablet 0   No current facility-administered medications for this encounter.   BP 120/70   Pulse 80   Ht 5' 3 (1.6 m)   Wt 93.3 kg (205 lb 9.6 oz)   SpO2 98%   BMI  36.42 kg/m   Wt Readings from Last 3 Encounters:  10/28/24 93.3 kg (205 lb 9.6 oz)  10/10/24 102.8 kg (226 lb 10.1 oz)  09/30/24 90.7 kg (200 lb)    Physical Exam  GENERAL: obese, NAD Lungs- clear  CARDIAC:  JVP not elevated         Irregularly irregular regular rhythm. No MRG. No LEE  ABDOMEN: obese, soft, non-tender, non-distended.  EXTREMITIES: Warm and well perfused.  NEUROLOGIC: No obvious FND   Assessment/Plan: 1. 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 Echo 6/17 EF 25-30%, Echo 9/20 with EF 40-45%, echo 11/22 showed EF 35-40% (in setting of atrial flutter).  Echo 12/23 showed EF still low at 40% so suspect her cardiomyopathy is not only tachycardia-mediated. Echo in 11/24 with EF 40-45%. Echo 10/25 30-35%, G3DD, normal RV systolic function, severe LAE, mild MR, normal IVC. Stable NYHA Class IIB-early III. Euvolemic on exam.  - Continue spironolactone  25 mg daily  - Continue Coreg  3.125 mg bid.  - Continue Farxiga  10 mg daily.  - Continue losartan  25 mg daily (did not tolerate Entresto  due to lightheadedness and has not wanted to retry).  - She feels like she is too claustrophobic to do a cardiac MRI.  - EF has fluctuated and back down again but in the setting of recurrent Afib (see plan below). If low EF persist, can consider referral to EP for ICD.  2. Atrial fibrillation/flutter: She developed atypical atrial flutter after redo atrial fibrillation ablation in 3/22.  Amiodarone  was stopped in 8/22. Back in atypical aflutter 11/22. Amiodarone  restarted and underwent successful DCCV. She had maintained NSR until 9/24 when she had atypical atrial flutter again with RVR and hypotension/AKI.  She had DCCV in 9/24.  She is back in atrial fibrillation, has been persistent x several wks. She is now well rate controlled. She was recently taken off Eliquis  for GIB briefly but has restarted. We discussed setting up for outpatient DCCV in several  wks but pt would like to wait until after she has undergone and recovered from her planned parathyroidectomy. Given that she is well rate controlled and w/o symptoms, I  think this would be reasonable.  - continue amiodarone  200 mg daily + Coreg  for rate control   - Continue apixaban  5 mg bid.  - she has been referred back to EP to consider redo ablation.  3. OSA: She has been diagnosed in the past but cannot tolerate CPAP.  4. Obesity: Body mass index is 36.42 kg/m. - She has been losing weight via dietary changes.  5. Hyperparathyroidism: Planned for parathyroidectomy.   6. Colon Cancer: s/p laparoscopic right colectomy on 10/20, which was curative  Surgical pathology benign showing tubular adenoma. Negative for invasive carcinoma. Twenty lymph nodes, all negative for carcinoma   F/u w/ Dr. Rolan in 6-8 wks   Caffie Shed, PA-C  10/28/2024

## 2024-10-29 ENCOUNTER — Ambulatory Visit (HOSPITAL_COMMUNITY): Payer: Self-pay | Admitting: Cardiology

## 2024-11-07 ENCOUNTER — Other Ambulatory Visit (HOSPITAL_COMMUNITY)

## 2024-11-07 ENCOUNTER — Encounter (HOSPITAL_COMMUNITY)

## 2024-12-04 NOTE — Patient Instructions (Signed)
 SURGICAL WAITING ROOM VISITATION  Patients having surgery or a procedure may have no more than 2 support people in the waiting area - these visitors may rotate.    Children ages 44 and under will not be able to visit patients in [facility] under most circumstances.   Visitors with respiratory illnesses are discouraged from visiting and should remain at home.  If the patient needs to stay at the hospital during part of their recovery, the visitor guidelines for inpatient rooms apply. Pre-op nurse will coordinate an appropriate time for 1 support person to accompany patient in pre-op.  This support person may not rotate.    Please refer to the [facility] website for the visitor guidelines for Inpatients (after your surgery is over and you are in a regular room).    Your procedure is scheduled on: 12/20/24   Report to [facility] Main Entrance    Report to admitting at 6:45 AM   Call this number if you have problems the morning of surgery [phone]   Do not eat food :After Midnight.   After Midnight you may have the following liquids until ______ AM/ PM DAY OF SURGERY  Water  Non-Citrus Juices (without pulp, NO RED-Apple, White grape, White cranberry) Black Coffee (NO MILK/CREAM OR CREAMERS, sugar ok)  Clear Tea (NO MILK/CREAM OR CREAMERS, sugar ok) regular and decaf                             Plain Jell-O (NO RED)                                           Fruit ices (not with fruit pulp, NO RED)                                     Popsicles (NO RED)                                                               Sports drinks like Gatorade (NO RED)              Drink 2 Ensure/G2 drinks AT 10:00 PM the night before surgery.        The day of surgery:  Drink ONE (1) Pre-Surgery Clear Ensure or G2 at AM the morning of surgery. Drink in one sitting. Do not sip.  This drink was given to you during your hospital  pre-op appointment visit. Nothing else to drink  after completing the  Pre-Surgery Clear Ensure or G2.          If you have questions, please contact your surgeons office.   FOLLOW BOWEL PREP AND ANY ADDITIONAL PRE OP INSTRUCTIONS YOU RECEIVED FROM YOUR SURGEON'S OFFICE!!!     Oral Hygiene is also important to reduce your risk of infection.                                    Remember - BRUSH YOUR TEETH THE  MORNING OF SURGERY WITH YOUR REGULAR TOOTHPASTE  DENTURES WILL BE REMOVED PRIOR TO SURGERY PLEASE DO NOT APPLY Poly grip OR ADHESIVES!!!   Do NOT smoke after Midnight   Stop all vitamins and herbal supplements 7 days before surgery.   Take these medicines the morning of surgery with A SIP OF WATER : Amiodarone , Carvedilol , Famotidine , Pantoprazole    DO NOT TAKE ANY ORAL DIABETIC MEDICATIONS DAY OF YOUR SURGERY  Bring CPAP mask and tubing day of surgery.                              You may not have any metal on your body including hair pins, jewelry, and body piercing             Do not wear make-up, lotions, powders, perfumes, or deodorant  Do not wear nail polish including gel and S&S, artificial/acrylic nails, or any other type of covering on natural nails including finger and toenails. If you have artificial nails, gel coating, etc. that needs to be removed by a nail salon please have this removed prior to surgery or surgery may need to be canceled/ delayed if the surgeon/ anesthesia feels like they are unable to be safely monitored.   Do not shave  48 hours prior to surgery.    Do not bring valuables to the hospital. [facility] IS NOT             RESPONSIBLE   FOR VALUABLES.   Contacts, glasses, dentures or bridgework may not be worn into surgery.  DO NOT BRING YOUR HOME MEDICATIONS TO THE HOSPITAL. PHARMACY WILL DISPENSE MEDICATIONS LISTED ON YOUR MEDICATION LIST TO YOU DURING YOUR ADMISSION IN THE HOSPITAL!    Patients discharged on the day of surgery will not be allowed to drive home.  Someone NEEDS to stay with  you for the first 24 hours after anesthesia.   Special Instructions: Bring a copy of your healthcare power of attorney and living will documents the day of surgery if you haven't scanned them before.              Please read over the following fact sheets you were given: IF YOU HAVE QUESTIONS ABOUT YOUR PRE-OP INSTRUCTIONS PLEASE CALL [phone].   If you received a COVID test during your pre-op visit  it is requested that you wear a mask when out in public, stay away from anyone that may not be feeling well and notify your surgeon if you develop symptoms. If you test positive for Covid or have been in contact with anyone that has tested positive in the last 10 days please notify you surgeon.    [facility] - Preparing for Surgery Before surgery, you can play an important role.  Because skin is not sterile, your skin needs to be as free of germs as possible.  You can reduce the number of germs on your skin by washing with CHG (chlorahexidine gluconate) soap before surgery.  CHG is an antiseptic cleaner which kills germs and bonds with the skin to continue killing germs even after washing. Please DO NOT use if you have an allergy to CHG or antibacterial soaps.  If your skin becomes reddened/irritated stop using the CHG and inform your nurse when you arrive at Short Stay. Do not shave (including legs and underarms) for at least 48 hours prior to the first CHG shower.  You may shave your face/neck.  Please follow these  instructions carefully:  1.  Shower with CHG Soap the night before surgery ONLY (DO NOT USE THE SOAP THE MORNING OF SURGERY).  2.  If you choose to wash your hair, wash your hair first as usual with your normal  shampoo.  3.  After you shampoo, rinse your hair and body thoroughly to remove the shampoo.                             4.  Use CHG as you would any other liquid soap.  You can apply chg directly to the skin and wash.  Gently with a scrungie or clean washcloth.  5.   Apply the CHG Soap to your body ONLY FROM THE NECK DOWN.   Do   not use on face/ open                           Wound or open sores. Avoid contact with eyes, ears mouth and   genitals (private parts).                       Wash face,  Genitals (private parts) with your normal soap.             6.  Wash thoroughly, paying special attention to the area where your    surgery  will be performed.  7.  Thoroughly rinse your body with warm water  from the neck down.  8.  DO NOT shower/wash with your normal soap after using and rinsing off the CHG Soap.                9.  Pat yourself dry with a clean towel.            10.  Wear clean pajamas.            11.  Place clean sheets on your bed the night of your first shower and do not  sleep with pets. Day of Surgery : Do not apply any CHG, lotions/deodorants the morning of surgery.  Please wear clean clothes to the hospital/surgery center.  FAILURE TO FOLLOW THESE INSTRUCTIONS MAY RESULT IN THE CANCELLATION OF YOUR SURGERY  PATIENT SIGNATURE_________________________________  NURSE SIGNATURE__________________________________  ________________________________________________________________________

## 2024-12-04 NOTE — Progress Notes (Signed)
 Date of COVID positive in last 90 days:  PCP - [facility] Cardiologist - Ezra Shuck, MD Endocrinologist- Dr. Tommas  Chest x-ray - 10/01/24 Epic EKG - 10/28/24 Epic Stress Test - N/A ECHO - yes in last year Cardiac Cath - N/A Pacemaker/ICD device last checked:N/A Spinal Cord Stimulator:N/A  Bowel Prep - N/A  Sleep Study - no CPAP - no  Fasting Blood Sugar - N/A Checks Blood Sugar _____ times a day  Last dose of GLP1 agonist-  N/A GLP1 instructions:  Do not take after     Last dose of SGLT-2 inhibitors-  N/A SGLT-2 instructions:  Do not take after     Blood Thinner Instructions: Eliquis , no instructions per pt.  Aspirin  Instructions:N/A Last Dose: 12/16/24 2300  Activity level: Can go up a few stairs and perform activities of daily living without stopping and without symptoms of chest pain or shortness of breath. Has walker and cane at home  Anesthesia review: a fib, CHF, OSA, CKD, HTN, need cardiac clearance   Patient denies shortness of breath, fever, cough and chest pain at PAT appointment  Patient verbalized understanding of instructions that were given to them at the PAT appointment. Patient was also instructed that they will need to review over the PAT instructions again at home before surgery.

## 2024-12-04 NOTE — Progress Notes (Signed)
 Please place orders for PAT appointment scheduled 12/04/24.

## 2024-12-05 ENCOUNTER — Encounter (HOSPITAL_COMMUNITY): Payer: Self-pay

## 2024-12-05 ENCOUNTER — Encounter (HOSPITAL_COMMUNITY)
Admission: RE | Admit: 2024-12-05 | Discharge: 2024-12-05 | Disposition: A | Discharge status: Home / Self Care | Source: Ambulatory Visit | Attending: Surgery | Admitting: Surgery

## 2024-12-05 ENCOUNTER — Other Ambulatory Visit: Payer: Self-pay

## 2024-12-05 VITALS — BP 134/52 | HR 72 | Resp 18 | Ht 63.0 in | Wt 200.0 lb

## 2024-12-05 DIAGNOSIS — C182 Malignant neoplasm of ascending colon: Secondary | ICD-10-CM | POA: Diagnosis not present

## 2024-12-05 DIAGNOSIS — Z01812 Encounter for preprocedural laboratory examination: Secondary | ICD-10-CM | POA: Insufficient documentation

## 2024-12-05 DIAGNOSIS — K219 Gastro-esophageal reflux disease without esophagitis: Secondary | ICD-10-CM | POA: Insufficient documentation

## 2024-12-05 DIAGNOSIS — Z7901 Long term (current) use of anticoagulants: Secondary | ICD-10-CM | POA: Diagnosis not present

## 2024-12-05 DIAGNOSIS — E210 Primary hyperparathyroidism: Secondary | ICD-10-CM | POA: Diagnosis not present

## 2024-12-05 DIAGNOSIS — I4891 Unspecified atrial fibrillation: Secondary | ICD-10-CM | POA: Diagnosis not present

## 2024-12-05 DIAGNOSIS — I509 Heart failure, unspecified: Secondary | ICD-10-CM | POA: Diagnosis not present

## 2024-12-05 DIAGNOSIS — M1990 Unspecified osteoarthritis, unspecified site: Secondary | ICD-10-CM | POA: Diagnosis not present

## 2024-12-05 DIAGNOSIS — I130 Hypertensive heart and chronic kidney disease with heart failure and stage 1 through stage 4 chronic kidney disease, or unspecified chronic kidney disease: Secondary | ICD-10-CM | POA: Insufficient documentation

## 2024-12-05 DIAGNOSIS — I428 Other cardiomyopathies: Secondary | ICD-10-CM | POA: Insufficient documentation

## 2024-12-05 DIAGNOSIS — D631 Anemia in chronic kidney disease: Secondary | ICD-10-CM | POA: Diagnosis not present

## 2024-12-05 DIAGNOSIS — N183 Chronic kidney disease, stage 3 unspecified: Secondary | ICD-10-CM | POA: Insufficient documentation

## 2024-12-05 DIAGNOSIS — I2510 Atherosclerotic heart disease of native coronary artery without angina pectoris: Secondary | ICD-10-CM | POA: Insufficient documentation

## 2024-12-05 DIAGNOSIS — G4733 Obstructive sleep apnea (adult) (pediatric): Secondary | ICD-10-CM | POA: Diagnosis not present

## 2024-12-05 DIAGNOSIS — Z01818 Encounter for other preprocedural examination: Secondary | ICD-10-CM | POA: Diagnosis present

## 2024-12-05 HISTORY — DX: Anemia, unspecified: D64.9

## 2024-12-05 LAB — BASIC METABOLIC PANEL WITH GFR
Anion gap: 9 (ref 5–15)
BUN: 26 mg/dL — ABNORMAL HIGH (ref 8–23)
CO2: 26 mmol/L (ref 22–32)
Calcium: 11.9 mg/dL — ABNORMAL HIGH (ref 8.9–10.3)
Chloride: 104 mmol/L (ref 98–111)
Creatinine, Ser: 1.62 mg/dL — ABNORMAL HIGH (ref 0.44–1.00)
GFR, Estimated: 32 mL/min — ABNORMAL LOW (ref >60)
Glucose, Bld: 136 mg/dL — ABNORMAL HIGH (ref 70–99)
Potassium: 4.7 mmol/L (ref 3.5–5.1)
Sodium: 139 mmol/L (ref 135–145)

## 2024-12-05 LAB — CBC
HCT: 39 % (ref 36.0–46.0)
Hemoglobin: 11.5 g/dL — ABNORMAL LOW (ref 12.0–15.0)
MCH: 25.2 pg — ABNORMAL LOW (ref 26.0–34.0)
MCHC: 29.5 g/dL — ABNORMAL LOW (ref 30.0–36.0)
MCV: 85.3 fL (ref 80.0–100.0)
Platelets: 436 K/uL — ABNORMAL HIGH (ref 150–400)
RBC: 4.57 MIL/uL (ref 3.87–5.11)
RDW: 18.8 % — ABNORMAL HIGH (ref 11.5–15.5)
WBC: 7.4 K/uL (ref 4.0–10.5)
nRBC: 0 % (ref 0.0–0.2)

## 2024-12-07 NOTE — Progress Notes (Signed)
 " Case: [id number] Date/Time: 12/20/24 0834   Procedure: PARATHYROIDECTOMY (Right) - RIGHT INFERIOR MINIMALLY INVASIVE PARATHYROIDECTOMY   Anesthesia type: General   Diagnosis: Primary hyperparathyroidism [E21.0]   Pre-op diagnosis: PRIMARY HYPERPARATHYROIDISM   [facility]: [facility]OM 05 / [facility]   Surgeons: Eletha Boas, MD       DISCUSSION: Kelli Webster is a 78 yo female with PMH of recent diagnosis of colon cancer s/p colectomy (09/2024), anemia, non obstructive CAD (by imaging), A.fib on Eliquis , HTN, CHF/NICM EF 30-35%, OSA (CPAP intolerance), GERD, CKD3, obesity, arthritis.  Patient was originally scheduled for surgery in Oct. She came to her PAT visit and pre op hgb was 7.1. Patient was advised to go to the ED. She was admitted from 10/14-10/23/25. While inpatient she had a colonoscopy and EGD which was remarkable for colon mass. She underwent laparoscopic right hemicolectomy on 10/07/24. Post op course was uncomplicated. She had an updated echo on 10/15 which showed EF stable at 30-35%, mild LVH, grade 3 DD, severe LA dilation, mild MR.  Pt follows with Cardiology for A.fib and CHF. She is s/p multiple DCCVs and ablations. Last cardioversion was in 10/2021 and last ablation was 02/2021. Last seen in clinic on 10/28/24 by PA Simmons. She reported that she was weak but recovering. Euvolemic on exam. Noted to be in A.fib. She was advised they would set her up for another cardioversion after surgery. She was advised to continue GDMT and f/u in 6-8 weeks. Regarding plans/clearance:  She was recently taken off Eliquis  for GIB briefly but has restarted. We discussed setting up for outpatient DCCV in several wks but pt would like to wait until after she has undergone and recovered from her planned parathyroidectomy. Given that she is well rate controlled and w/o symptoms, I think this would be reasonable.  VS: BP (!) 134/52   Pulse 72   Resp 18   Ht 5' 3 (1.6 m)   Wt 90.7 kg   SpO2 96%   BMI 35.43  kg/m   PROVIDERS: [facility]   LABS: Labs reviewed: Acceptable for surgery. (all labs ordered are listed, but only abnormal results are displayed)  Labs Reviewed  BASIC METABOLIC PANEL WITH GFR - Abnormal; Notable for the following components:      Result Value   Glucose, Bld 136 (*)    BUN 26 (*)    Creatinine, Ser 1.62 (*)    Calcium 11.9 (*)    GFR, Estimated 32 (*)    All other components within normal limits  CBC - Abnormal; Notable for the following components:   Hemoglobin 11.5 (*)    MCH 25.2 (*)    MCHC 29.5 (*)    RDW 18.8 (*)    Platelets 436 (*)    All other components within normal limits     EKG 10/28/24:  A.fib Right superior axis deviation Non-specific IVCD PVCs   Echo 10/02/24:  IMPRESSIONS    1. Left ventricular ejection fraction, by estimation, is 30 to 35%. The left ventricle has moderately decreased function. The left ventricle demonstrates global hypokinesis. There is mild left ventricular hypertrophy. Left ventricular diastolic parameters are consistent with Grade III diastolic dysfunction (restrictive).  2. Right ventricular systolic function is normal. The right ventricular size is normal.  3. Left atrial size was severely dilated.  4. The mitral valve is normal in structure. Mild mitral valve regurgitation. No evidence of mitral stenosis.  5. The aortic valve is tricuspid. Aortic valve regurgitation is  trivial. No aortic stenosis is present.  6. The inferior vena cava is normal in size with greater than 50% respiratory variability, suggesting right atrial pressure of 3 mmHg.  CT Cardiac 02/11/2021:  IMPRESSION: 1. There is normal pulmonary vein drainage into the left atrium.   2. The left atrial appendage is large chicken wing type with two lobes and ostial size 26 x 16 mm and length 38 mm. There is no thrombus in the left atrial appendage.   3. The esophagus runs in the left atrial midline  and is not in the proximity to any of the pulmonary veins.   4. Calcification noted in all three coronary distributions. Coronary calcium score 698. This is 91st percentile compared to other age and sex-matched controls.  Past Medical History:  Diagnosis Date   Anemia    in past   Chronic kidney disease    CKD3a   Chronic systolic heart failure (HCC)    a. ECHO (09/2013) EF 20-25% diff HK, mild/mod MR, LA mod dil, RV mildly dil, RA mildly dil b. R/LHC (09/26/13) AO 133/88, LV 137/17, Lmain no dz, LAD 30% prox, LCX mild luminal irreg; RCA mild luminal irreg   Dysrhythmia    A.fib   had cardioversions and ablations   GERD (gastroesophageal reflux disease)    a. 09/2013 endoscopy nl   Hypertension    Morbid obesity (HCC)    Osteoarthritis    Persistent atrial fibrillation (HCC) Chads2vascscore of at least 4(02/12/15)   a. Dx 09/2013 b. s/p PVI by Dr Kelsie 11-26-2013   Sleep apnea    No CPAP d/t claustrophobia    Past Surgical History:  Procedure Laterality Date   ABLATION  11/26/2013   PVI by Dr Kelsie   arthroscopy       left knee     ATRIAL FIBRILLATION ABLATION N/A 11/26/2013   Procedure: ATRIAL FIBRILLATION ABLATION;  Surgeon: Lynwood JONETTA Kelsie, MD;  [facility];  Service: Cardiovascular;  Laterality: N/A;   ATRIAL FIBRILLATION ABLATION N/A 02/18/2021   Procedure: ATRIAL FIBRILLATION ABLATION;  Surgeon: Kelsie Lynwood, MD;  [facility] INVASIVE CV LAB;  Service: Cardiovascular;  Laterality: N/A;   CARDIOVERSION N/A 10/01/2013   Procedure: CARDIOVERSION;  Surgeon: Ezra GORMAN Shuck, MD;  Location: [facility] ENDOSCOPY;  Service: Cardiovascular;  Laterality: N/A;   CARDIOVERSION N/A 10/17/2013   Procedure: CARDIOVERSION;  Surgeon: Ezra GORMAN Shuck, MD;  Location: [facility] ENDOSCOPY;  Service: Cardiovascular;  Laterality: N/A;   CARDIOVERSION N/A 02/20/2019   Procedure: CARDIOVERSION;  Surgeon: Shuck Ezra GORMAN, MD;  Location: [facility] ENDOSCOPY;  Service: Cardiovascular;  Laterality: N/A;    CARDIOVERSION N/A 11/19/2020   Procedure: CARDIOVERSION;  Surgeon: Shuck Ezra GORMAN, MD;  Location: [facility] ENDOSCOPY;  Service: Cardiovascular;  Laterality: N/A;   CARDIOVERSION N/A 12/25/2020   Procedure: CARDIOVERSION;  Surgeon: Shuck Ezra GORMAN, MD;  Location: [facility] ENDOSCOPY;  Service: Cardiovascular;  Laterality: N/A;   CARDIOVERSION N/A 11/17/2021   Procedure: CARDIOVERSION;  Surgeon: Shuck Ezra GORMAN, MD;  Location: [facility] ENDOSCOPY;  Service: Cardiovascular;  Laterality: N/A;   COLONOSCOPY N/A 10/03/2024   Procedure: COLONOSCOPY;  Surgeon: Elicia Claw, MD;  [facility]OS[facility];  Service: Gastroenterology;  Laterality: N/A;   ESOPHAGOGASTRODUODENOSCOPY N/A 09/25/2013   Procedure: ESOPHAGOGASTRODUODENOSCOPY (EGD);  Surgeon: Lesta JULIANNA Fitz, MD;  Location: [facility] ENDOSCOPY;  Service: Endoscopy;  Laterality: N/A;   ESOPHAGOGASTRODUODENOSCOPY N/A 10/03/2024   Procedure: EGD (ESOPHAGOGASTRODUODENOSCOPY);  Surgeon: Elicia Claw, MD;  Location: [facility] ENDOSCOPY;  Service: Gastroenterology;  Laterality: N/A;   fx  left wrist     open reduction & internal fixaction   LAPAROSCOPIC RIGHT COLECTOMY N/A 10/07/2024   Procedure: COLECTOMY, RIGHT, LAPAROSCOPIC;  Surgeon: Lyndel Deward PARAS, MD;  [facility];  Service: General;  Laterality: N/A;  Right colectomy   LEFT HEART CATHETERIZATION WITH CORONARY ANGIOGRAM  09/26/2013   Procedure: LEFT HEART CATHETERIZATION WITH CORONARY ANGIOGRAM;  Surgeon: Ezra GORMAN Shuck, MD;  Location: [facility] CATH LAB;  Service: Cardiovascular;;   TEE WITHOUT CARDIOVERSION N/A 10/01/2013   Procedure: TRANSESOPHAGEAL ECHOCARDIOGRAM (TEE);  Surgeon: Ezra GORMAN Shuck, MD;  Location: [facility] ENDOSCOPY;  Service: Cardiovascular;  Laterality: N/A;   TEE WITHOUT CARDIOVERSION N/A 11/26/2013   Procedure: TRANSESOPHAGEAL ECHOCARDIOGRAM (TEE);  Surgeon: Ezra GORMAN Shuck, MD;  Location: [facility] ENDOSCOPY;  Service: Cardiovascular;  Laterality: N/A;   TONSILLECTOMY     age 78    MEDICATIONS:   amiodarone  (PACERONE ) 200 MG tablet   carvedilol  (COREG ) 3.125 MG tablet   docusate sodium  (COLACE) 100 MG capsule   ELIQUIS  5 MG TABS tablet   famotidine  (PEPCID ) 20 MG tablet   FARXIGA  10 MG TABS tablet   furosemide  (LASIX ) 20 MG tablet   losartan  (COZAAR ) 25 MG tablet   MAGNESIUM  PO   Multiple Vitamins-Minerals (CENTRUM WOMEN) TABS   pantoprazole  (PROTONIX ) 40 MG tablet   spironolactone  (ALDACTONE ) 25 MG tablet   No current facility-administered medications for this encounter.   Burnard CHRISTELLA Odis DEVONNA [facility] Short Stay/Anesthesiology [facility] Phone [phone] 12/07/2024 7:25 PM         "

## 2024-12-07 NOTE — Anesthesia Preprocedure Evaluation (Addendum)
"                                    Anesthesia Evaluation  Patient identified by MRN, date of birth, ID band Patient awake    Reviewed: Allergy & Precautions, NPO status , Patient's Chart, lab work & pertinent test results, reviewed documented beta blocker date and time   Airway Mallampati: III  TM Distance: >3 FB Neck ROM: Full    Dental no notable dental hx. (+) Teeth Intact, Dental Advisory Given   Pulmonary sleep apnea (does not wear CPAP)    Pulmonary exam normal breath sounds clear to auscultation       Cardiovascular hypertension, Pt. on home beta blockers and Pt. on medications Normal cardiovascular exam+ dysrhythmias (on eliquis ) Atrial Fibrillation  Rhythm:Regular Rate:Normal  TTE 2025 1. Left ventricular ejection fraction, by estimation, is 30 to 35%. The  left ventricle has moderately decreased function. The left ventricle  demonstrates global hypokinesis. There is mild left ventricular  hypertrophy. Left ventricular diastolic  parameters are consistent with Grade III diastolic dysfunction  (restrictive).   2. Right ventricular systolic function is normal. The right ventricular  size is normal.   3. Left atrial size was severely dilated.   4. The mitral valve is normal in structure. Mild mitral valve  regurgitation. No evidence of mitral stenosis.   5. The aortic valve is tricuspid. Aortic valve regurgitation is trivial.  No aortic stenosis is present.   6. The inferior vena cava is normal in size with greater than 50%  respiratory variability, suggesting right atrial pressure of 3 mmHg.     Neuro/Psych negative neurological ROS  negative psych ROS   GI/Hepatic Neg liver ROS,GERD  ,,  Endo/Other  negative endocrine ROS    Renal/GU Renal InsufficiencyRenal disease  negative genitourinary   Musculoskeletal  (+) Arthritis ,    Abdominal   Peds  Hematology negative hematology ROS (+)   Anesthesia Other Findings    Reproductive/Obstetrics                              Anesthesia Physical Anesthesia Plan  ASA: 3  Anesthesia Plan: General   Post-op Pain Management: Tylenol  PO (pre-op)*   Induction: Intravenous  PONV Risk Score and Plan: 3 and Dexamethasone, Ondansetron  and Treatment may vary due to age or medical condition  Airway Management Planned: Oral ETT  Additional Equipment:   Intra-op Plan:   Post-operative Plan: Extubation in OR  Informed Consent: I have reviewed the patients History and Physical, chart, labs and discussed the procedure including the risks, benefits and alternatives for the proposed anesthesia with the patient or authorized representative who has indicated his/her understanding and acceptance.     Dental advisory given  Plan Discussed with: CRNA  Anesthesia Plan Comments: (See PAT note from 12/18)         Anesthesia Quick Evaluation  "

## 2024-12-07 NOTE — Progress Notes (Incomplete Revision)
 " Case: [id number] Date/Time: 12/20/24 0834   Procedure: PARATHYROIDECTOMY (Right) - RIGHT INFERIOR MINIMALLY INVASIVE PARATHYROIDECTOMY   Anesthesia type: General   Diagnosis: Primary hyperparathyroidism [E21.0]   Pre-op diagnosis: PRIMARY HYPERPARATHYROIDISM   [facility]OM 05 [facility]   Surgeons: Eletha Boas, MD       DISCUSSION: Kelli Webster is a 78 yo female with PMH of recent diagnosis of colon cancer s/p colectomy (09/2024), anemia, non obstructive CAD (by imaging), A.fib on Eliquis , HTN, CHF/NICM EF 30-35%, OSA (CPAP intolerance), GERD, CKD3, obesity, arthritis.  Patient was originally scheduled for surgery in Oct. She came to her PAT visit and pre op hgb was 7.1. Patient was advised to go to the ED. She was admitted from 10/14-10/23/25. While inpatient she had a colonoscopy and EGD which was remarkable for colon mass. She underwent laparoscopic right hemicolectomy on 10/07/24. Post op course was uncomplicated. She had an updated echo on 10/15 which showed EF stable at 30-35%, mild LVH, grade 3 DD, severe LA dilation, mild MR.  Pt follows with Cardiology for A.fib and CHF. She is s/p multiple DCCVs and ablations. Last cardioversion was in 10/2021 and last ablation was 02/2021. Last seen in clinic on 10/28/24 by PA Simmons. She reported that she was weak but recovering. Euvolemic on exam. Noted to be in A.fib. She was advised they would set her up for another cardioversion after surgery. She was advised to continue GDMT and f/u in 6-8 weeks. Regarding plans/clearance:  She was recently taken off Eliquis  for GIB briefly but has restarted. We discussed setting up for outpatient DCCV in several wks but pt would like to wait until after she has undergone and recovered from her planned parathyroidectomy. Given that she is well rate controlled and w/o symptoms, I think this would be reasonable.  LD Eliquis  ***  VS: BP (!) 134/52   Pulse 72   Resp 18   Ht 5' 3 (1.6 m)   Wt 90.7 kg    SpO2 96%   BMI 35.43 kg/m   PROVIDERS: [facility]   LABS: Labs reviewed: Acceptable for surgery. (all labs ordered are listed, but only abnormal results are displayed)  Labs Reviewed  BASIC METABOLIC PANEL WITH GFR - Abnormal; Notable for the following components:      Result Value   Glucose, Bld 136 (*)    BUN 26 (*)    Creatinine, Ser 1.62 (*)    Calcium 11.9 (*)    GFR, Estimated 32 (*)    All other components within normal limits  CBC - Abnormal; Notable for the following components:   Hemoglobin 11.5 (*)    MCH 25.2 (*)    MCHC 29.5 (*)    RDW 18.8 (*)    Platelets 436 (*)    All other components within normal limits     EKG 10/28/24:  A.fib Right superior axis deviation Non-specific IVCD PVCs   Echo 10/02/24:  IMPRESSIONS    1. Left ventricular ejection fraction, by estimation, is 30 to 35%. The left ventricle has moderately decreased function. The left ventricle demonstrates global hypokinesis. There is mild left ventricular hypertrophy. Left ventricular diastolic parameters are consistent with Grade III diastolic dysfunction (restrictive).  2. Right ventricular systolic function is normal. The right ventricular size is normal.  3. Left atrial size was severely dilated.  4. The mitral valve is normal in structure. Mild mitral valve regurgitation. No evidence of mitral stenosis.  5. The aortic valve is tricuspid.  Aortic valve regurgitation is trivial. No aortic stenosis is present.  6. The inferior vena cava is normal in size with greater than 50% respiratory variability, suggesting right atrial pressure of 3 mmHg.  CT Cardiac 02/11/2021:  IMPRESSION: 1. There is normal pulmonary vein drainage into the left atrium.   2. The left atrial appendage is large chicken wing type with two lobes and ostial size 26 x 16 mm and length 38 mm. There is no thrombus in the left atrial appendage.   3. The esophagus runs in the  left atrial midline and is not in the proximity to any of the pulmonary veins.   4. Calcification noted in all three coronary distributions. Coronary calcium score 698. This is 91st percentile compared to other age and sex-matched controls.  Past Medical History:  Diagnosis Date   Anemia    in past   Chronic kidney disease    CKD3a   Chronic systolic heart failure (HCC)    a. ECHO (09/2013) EF 20-25% diff HK, mild/mod MR, LA mod dil, RV mildly dil, RA mildly dil b. R/LHC (09/26/13) AO 133/88, LV 137/17, Lmain no dz, LAD 30% prox, LCX mild luminal irreg; RCA mild luminal irreg   Dysrhythmia    A.fib   had cardioversions and ablations   GERD (gastroesophageal reflux disease)    a. 09/2013 endoscopy nl   Hypertension    Morbid obesity (HCC)    Osteoarthritis    Persistent atrial fibrillation (HCC) Chads2vascscore of at least 4(02/12/15)   a. Dx 09/2013 b. s/p PVI by Dr Kelsie 11-26-2013   Sleep apnea    No CPAP d/t claustrophobia    Past Surgical History:  Procedure Laterality Date   ABLATION  11/26/2013   PVI by Dr Kelsie   arthroscopy       left knee     ATRIAL FIBRILLATION ABLATION N/A 11/26/2013   Procedure: ATRIAL FIBRILLATION ABLATION;  Surgeon: Lynwood JONETTA Kelsie, MD;  [facility];  Service: Cardiovascular;  Laterality: N/A;   ATRIAL FIBRILLATION ABLATION N/A 02/18/2021   Procedure: ATRIAL FIBRILLATION ABLATION;  Surgeon: Kelsie Lynwood, MD;  [facility] INVASIVE CV LAB;  Service: Cardiovascular;  Laterality: N/A;   CARDIOVERSION N/A 10/01/2013   Procedure: CARDIOVERSION;  Surgeon: Ezra GORMAN Shuck, MD;  Location: [facility] ENDOSCOPY;  Service: Cardiovascular;  Laterality: N/A;   CARDIOVERSION N/A 10/17/2013   Procedure: CARDIOVERSION;  Surgeon: Ezra GORMAN Shuck, MD;  Location: [facility] ENDOSCOPY;  Service: Cardiovascular;  Laterality: N/A;   CARDIOVERSION N/A 02/20/2019   Procedure: CARDIOVERSION;  Surgeon: Shuck Ezra GORMAN, MD;  Location: [facility] ENDOSCOPY;  Service: Cardiovascular;   Laterality: N/A;   CARDIOVERSION N/A 11/19/2020   Procedure: CARDIOVERSION;  Surgeon: Shuck Ezra GORMAN, MD;  Location: [facility] ENDOSCOPY;  Service: Cardiovascular;  Laterality: N/A;   CARDIOVERSION N/A 12/25/2020   Procedure: CARDIOVERSION;  Surgeon: Shuck Ezra GORMAN, MD;  Location: [facility] ENDOSCOPY;  Service: Cardiovascular;  Laterality: N/A;   CARDIOVERSION N/A 11/17/2021   Procedure: CARDIOVERSION;  Surgeon: Shuck Ezra GORMAN, MD;  Location: [facility] ENDOSCOPY;  Service: Cardiovascular;  Laterality: N/A;   COLONOSCOPY N/A 10/03/2024   Procedure: COLONOSCOPY;  Surgeon: Elicia Claw, MD;  Location[facility]OS[facility];  Service: Gastroenterology;  Laterality: N/A;   ESOPHAGOGASTRODUODENOSCOPY N/A 09/25/2013   Procedure: ESOPHAGOGASTRODUODENOSCOPY (EGD);  Surgeon: Lesta JULIANNA Fitz, MD;  Location: [facility] ENDOSCOPY;  Service: Endoscopy;  Laterality: N/A;   ESOPHAGOGASTRODUODENOSCOPY N/A 10/03/2024   Procedure: EGD (ESOPHAGOGASTRODUODENOSCOPY);  Surgeon: Elicia Claw, MD;  Location: [facility];  Service: Gastroenterology;  Laterality:  N/A;   fx left wrist     open reduction & internal fixaction   LAPAROSCOPIC RIGHT COLECTOMY N/A 10/07/2024   Procedure: COLECTOMY, RIGHT, LAPAROSCOPIC;  Surgeon: Lyndel Deward PARAS, MD;  Location: [facility];  Service: General;  Laterality: N/A;  Right colectomy   LEFT HEART CATHETERIZATION WITH CORONARY ANGIOGRAM  09/26/2013   Procedure: LEFT HEART CATHETERIZATION WITH CORONARY ANGIOGRAM;  Surgeon: Ezra GORMAN Shuck, MD;  Location: [facility] CATH LAB;  Service: Cardiovascular;;   TEE WITHOUT CARDIOVERSION N/A 10/01/2013   Procedure: TRANSESOPHAGEAL ECHOCARDIOGRAM (TEE);  Surgeon: Ezra GORMAN Shuck, MD;  Location: [facility] ENDOSCOPY;  Service: Cardiovascular;  Laterality: N/A;   TEE WITHOUT CARDIOVERSION N/A 11/26/2013   Procedure: TRANSESOPHAGEAL ECHOCARDIOGRAM (TEE);  Surgeon: Ezra GORMAN Shuck, MD;  Location: [facility] ENDOSCOPY;  Service: Cardiovascular;  Laterality: N/A;   TONSILLECTOMY     age 38     MEDICATIONS:  amiodarone  (PACERONE ) 200 MG tablet   carvedilol  (COREG ) 3.125 MG tablet   docusate sodium  (COLACE) 100 MG capsule   ELIQUIS  5 MG TABS tablet   famotidine  (PEPCID ) 20 MG tablet   FARXIGA  10 MG TABS tablet   furosemide  (LASIX ) 20 MG tablet   losartan  (COZAAR ) 25 MG tablet   MAGNESIUM  PO   Multiple Vitamins-Minerals (CENTRUM WOMEN) TABS   pantoprazole  (PROTONIX ) 40 MG tablet   spironolactone  (ALDACTONE ) 25 MG tablet   No current facility-administered medications for this encounter.   Burnard CHRISTELLA Odis DEVONNA [facility] Short Stay/Anesthesiology [facility] Phone [phone] 12/07/2024 7:25 PM         "

## 2024-12-13 ENCOUNTER — Encounter (HOSPITAL_COMMUNITY): Payer: Self-pay | Admitting: Surgery

## 2024-12-13 DIAGNOSIS — E210 Primary hyperparathyroidism: Secondary | ICD-10-CM | POA: Diagnosis present

## 2024-12-13 NOTE — H&P (Signed)
 "    REFERRING PHYSICIAN: Balan, Bindubal K, MD  PROVIDER: [name], MD   Chief Complaint: New Consultation ( Primary hyperparathyroidism)  History of Present Illness:  Patient is referred by Dr. Littie Caffey for surgical evaluation and management of newly diagnosed primary hyperparathyroidism. Patient was noted after a back injury when evaluated by her orthopedic surgeon to have probable osteoporosis. She was referred to endocrinology. Evaluation by Dr. Littie Caffey identified signs and symptoms of primary hyperparathyroidism. Calcium level at her most recent laboratory studies was elevated at 12.0. Intact PTH level was elevated at 162. Patient underwent a nuclear medicine parathyroid  scan with sestamibi on August 03, 2024. 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 Patient has no prior history of head or neck surgery. There is a history of medical thyroid  disease in the patient's mother and sister. Patient notes fatigue. She denies nephrolithiasis. She likely has osteoporosis. She does note some bone and joint discomfort. Patient presents today for further recommendations for evaluation and management of newly diagnosed primary hyperparathyroidism.  Review of Systems: A complete review of systems was obtained from the patient. I have reviewed this information and discussed as appropriate with the patient. See HPI as well for other ROS.  Review of Systems  Constitutional: Positive for malaise/fatigue.  HENT: Negative.  Eyes: Negative.  Respiratory: Negative.  Cardiovascular: Negative.  Gastrointestinal: Negative.  Genitourinary: Negative.  Musculoskeletal: Positive for joint pain.  Skin: Negative.  Neurological: Negative.  Endo/Heme/Allergies: Negative.  Psychiatric/Behavioral: Negative.    Medical History: Past Medical History:  Diagnosis Date  Anemia   Arrhythmia  Arthritis  CHF (congestive heart failure) (CMS/HHS-HCC)   Patient Active Problem List  Diagnosis  Primary hyperparathyroidism (CMS/HHS-HCC)   Past Surgical History:  Procedure Laterality Date  .knee surgery    No Known Allergies  Current Outpatient Medications on File Prior to Visit  Medication Sig Dispense Refill  acetaminophen  (TYLENOL ) 325 MG tablet Take 325-650 mg by mouth  AMIOdarone  (PACERONE ) 200 MG tablet Take 200 mg by mouth once daily  carvediloL  (COREG ) 3.125 MG tablet Take 3.125 mg by mouth 2 (two) times daily  ELIQUIS  5 mg tablet Take 5 mg by mouth 2 (two) times daily  FARXIGA  10 mg tablet Take 10 mg by mouth every morning before breakfast  losartan  (COZAAR ) 25 MG tablet Take 25 mg by mouth once daily  multivitamin with minerals (DAILY MULTIVITAMIN-MINERALS) tablet Take by mouth  spironolactone  (ALDACTONE ) 25 MG tablet Take 12.5 mg by mouth once daily   No current facility-administered medications on file prior to visit.   Family History  Problem Relation Age of Onset  Heart valve disease Father    Social History   Tobacco Use  Smoking Status Never  Smokeless Tobacco Never    Social History   Socioeconomic History  Marital status: Married  Tobacco Use  Smoking status: Never  Smokeless tobacco: Never  Vaping Use  Vaping status: Unknown  Substance and Sexual Activity  Alcohol use: Never  Drug use: Never   Social Drivers of Health   Food Insecurity: Low Risk (06/04/2024)  Received from [facility]  Hunger Vital Sign  Within the past 12 months, you worried that your food would run out before you got money to buy more: Never true  Within the past 12 months, the food you bought just didn't last and you didn't have money to get more. : Never true  Transportation  Needs: No Transportation Needs (06/04/2024)  Received from [facility]  In the past 12 months, has lack of reliable transportation kept you from medical  appointments, meetings, work or from getting things needed for daily living? : No  Housing Stability: Unknown (08/15/2024)  Housing Stability Vital Sign  Homeless in the Last Year: No   Objective:   Vitals:  BP: 135/80  Pulse: 86  Resp: 16  Temp: 36.5 C (97.7 F)  SpO2: 99%  Weight: 97.5 kg (215 lb)  Height: 161.3 cm (5' 3.5)  PainSc: 4  PainLoc: Back   Body mass index is 37.49 kg/m.  Physical Exam   GENERAL APPEARANCE Comfortable, no acute issues Development: normal Gross deformities: none  SKIN Rash, lesions, ulcers: none Induration, erythema: none Nodules: none palpable  EYES Conjunctiva and lids: normal Pupils: equal  EARS, NOSE, MOUTH, THROAT External ears: no lesion or deformity External nose: no lesion or deformity Hearing: grossly normal  NECK Symmetric: yes Trachea: midline Thyroid : no palpable nodules in the thyroid  bed  CHEST/CV Not assessed  ABDOMEN Not assessed  GENITOURINARY/RECTAL Not assessed  LYMPHATIC Cervical: none palpable Supraclavicular: none palpable  PSYCHIATRIC Oriented to person, place, and time: yes Mood and affect: normal for situation Judgment and insight: appropriate for situation   Assessment and Plan:   Primary hyperparathyroidism (CMS/HHS-HCC)  Patient is referred by her endocrinologist for surgical evaluation and management of newly diagnosed primary hyperparathyroidism.  Patient provided with a copy of Parathyroid  Surgery: Treatment for Your Parathyroid  Gland Problem, published by [employer], 12 pages. Book reviewed and explained to patient during visit today.  Today we reviewed her clinical history. We reviewed her recent nuclear medicine parathyroid  scan and recent laboratory studies. We reviewed the symptoms that she has been experiencing. I would like to proceed with additional imaging to include an ultrasound examination to evaluate the thyroid  and a 4D CT scan of the neck and upper chest to evaluate for  parathyroid  adenoma. Patient will likely be a good candidate for minimally invasive outpatient parathyroid  surgery. We discussed the procedure today. We discussed the size and location of the surgical incision. We discussed the hospital stay to be anticipated. We discussed potential complications including the small risk of recurrent laryngeal nerve injury. We discussed her postoperative recovery. The patient understands and agrees to proceed with further diagnostic testing.  Patient is also chronically anticoagulated with a history of intermittent atrial fibrillation. We will ask her cardiologist for preoperative cardiac assessment and clearance. We have also asked them to guide us  on perioperative management of her anticoagulation.  Patient will undergo the above testing. We will contact her with the results when they are available and make plans for further management at that time.  Kelli Spinner, MD [facility] practice Office: [phone]   "

## 2024-12-20 ENCOUNTER — Ambulatory Visit (HOSPITAL_COMMUNITY): Admitting: Certified Registered"

## 2024-12-20 ENCOUNTER — Encounter (HOSPITAL_COMMUNITY): Payer: Self-pay | Admitting: Surgery

## 2024-12-20 ENCOUNTER — Encounter (HOSPITAL_COMMUNITY)
Admission: RE | Disposition: A | Discharge status: Home / Self Care | Payer: Self-pay | Source: Ambulatory Visit | Attending: Surgery

## 2024-12-20 ENCOUNTER — Ambulatory Visit (HOSPITAL_COMMUNITY)
Admission: RE | Admit: 2024-12-20 | Discharge: 2024-12-20 | Disposition: A | Discharge status: Home / Self Care | Source: Ambulatory Visit | Attending: Surgery | Admitting: Surgery

## 2024-12-20 ENCOUNTER — Other Ambulatory Visit: Payer: Self-pay

## 2024-12-20 ENCOUNTER — Ambulatory Visit (HOSPITAL_COMMUNITY): Payer: Self-pay | Admitting: Medical

## 2024-12-20 DIAGNOSIS — I5042 Chronic combined systolic (congestive) and diastolic (congestive) heart failure: Secondary | ICD-10-CM

## 2024-12-20 DIAGNOSIS — Z8349 Family history of other endocrine, nutritional and metabolic diseases: Secondary | ICD-10-CM | POA: Diagnosis not present

## 2024-12-20 DIAGNOSIS — N189 Chronic kidney disease, unspecified: Secondary | ICD-10-CM

## 2024-12-20 DIAGNOSIS — I130 Hypertensive heart and chronic kidney disease with heart failure and stage 1 through stage 4 chronic kidney disease, or unspecified chronic kidney disease: Secondary | ICD-10-CM

## 2024-12-20 DIAGNOSIS — Z7901 Long term (current) use of anticoagulants: Secondary | ICD-10-CM | POA: Insufficient documentation

## 2024-12-20 DIAGNOSIS — E210 Primary hyperparathyroidism: Secondary | ICD-10-CM | POA: Insufficient documentation

## 2024-12-20 DIAGNOSIS — D351 Benign neoplasm of parathyroid gland: Secondary | ICD-10-CM | POA: Diagnosis not present

## 2024-12-20 DIAGNOSIS — I480 Paroxysmal atrial fibrillation: Secondary | ICD-10-CM | POA: Insufficient documentation

## 2024-12-20 HISTORY — PX: PARATHYROIDECTOMY: SHX19

## 2024-12-20 SURGERY — PARATHYROIDECTOMY
Anesthesia: General | Site: Neck | Laterality: Right

## 2024-12-20 MED ORDER — DEXAMETHASONE SOD PHOSPHATE PF 10 MG/ML IJ SOLN
INTRAMUSCULAR | Status: DC | PRN
  Administered 2024-12-20: 4 mg via INTRAVENOUS

## 2024-12-20 MED ORDER — CEFAZOLIN SODIUM-DEXTROSE 2-4 GM/100ML-% IV SOLN
2.0000 g | Freq: Once | INTRAVENOUS | Status: AC
  Administered 2024-12-20: 2 g via INTRAVENOUS
  Filled 2024-12-20: qty 100

## 2024-12-20 MED ORDER — FENTANYL CITRATE (PF) 100 MCG/2ML IJ SOLN
INTRAMUSCULAR | Status: AC
  Filled 2024-12-20: qty 2

## 2024-12-20 MED ORDER — PROPOFOL 10 MG/ML IV BOLUS
INTRAVENOUS | Status: DC | PRN
  Administered 2024-12-20: 130 mg via INTRAVENOUS

## 2024-12-20 MED ORDER — OXYCODONE HCL 5 MG/5ML PO SOLN
5.0000 mg | Freq: Once | ORAL | Status: AC | PRN
  Administered 2024-12-20: 5 mg via ORAL

## 2024-12-20 MED ORDER — PROPOFOL 10 MG/ML IV BOLUS
INTRAVENOUS | Status: AC
  Filled 2024-12-20: qty 20

## 2024-12-20 MED ORDER — TRAMADOL HCL 50 MG PO TABS: 50.0000 mg | ORAL_TABLET | Freq: Four times a day (QID) | ORAL | 0 refills | Status: AC | PRN

## 2024-12-20 MED ORDER — ROCURONIUM BROMIDE 10 MG/ML (PF) SYRINGE
PREFILLED_SYRINGE | INTRAVENOUS | Status: AC
  Filled 2024-12-20: qty 10

## 2024-12-20 MED ORDER — 0.9 % SODIUM CHLORIDE (POUR BTL) OPTIME
TOPICAL | Status: DC | PRN
  Administered 2024-12-20: 1000 mL

## 2024-12-20 MED ORDER — AMISULPRIDE (ANTIEMETIC) 5 MG/2ML IV SOLN: 10.0000 mg | Freq: Once | INTRAVENOUS | Status: DC | PRN

## 2024-12-20 MED ORDER — PHENYLEPHRINE HCL (PRESSORS) 10 MG/ML IV SOLN
INTRAVENOUS | Status: AC
  Filled 2024-12-20: qty 1

## 2024-12-20 MED ORDER — ROCURONIUM BROMIDE 10 MG/ML (PF) SYRINGE
PREFILLED_SYRINGE | INTRAVENOUS | Status: DC | PRN
  Administered 2024-12-20: 50 mg via INTRAVENOUS

## 2024-12-20 MED ORDER — LACTATED RINGERS IV SOLN: INTRAVENOUS | Status: DC

## 2024-12-20 MED ORDER — FENTANYL CITRATE (PF) 100 MCG/2ML IJ SOLN
INTRAMUSCULAR | Status: DC | PRN
  Administered 2024-12-20 (×2): 50 ug via INTRAVENOUS

## 2024-12-20 MED ORDER — BUPIVACAINE HCL (PF) 0.5 % IJ SOLN
INTRAMUSCULAR | Status: AC
  Filled 2024-12-20: qty 30

## 2024-12-20 MED ORDER — PHENYLEPHRINE 80 MCG/ML (10ML) SYRINGE FOR IV PUSH (FOR BLOOD PRESSURE SUPPORT)
PREFILLED_SYRINGE | INTRAVENOUS | Status: DC | PRN
  Administered 2024-12-20 (×2): 160 ug via INTRAVENOUS

## 2024-12-20 MED ORDER — ACETAMINOPHEN 500 MG PO TABS
1000.0000 mg | ORAL_TABLET | Freq: Once | ORAL | Status: DC
  Filled 2024-12-20: qty 2

## 2024-12-20 MED ORDER — CHLORHEXIDINE GLUCONATE 0.12 % MT SOLN
15.0000 mL | Freq: Once | OROMUCOSAL | Status: AC
  Administered 2024-12-20: 15 mL via OROMUCOSAL

## 2024-12-20 MED ORDER — HEMOSTATIC AGENTS (NO CHARGE) OPTIME
TOPICAL | Status: DC | PRN
  Administered 2024-12-20: 1 via TOPICAL

## 2024-12-20 MED ORDER — ONDANSETRON HCL 4 MG/2ML IJ SOLN
INTRAMUSCULAR | Status: AC
  Filled 2024-12-20: qty 2

## 2024-12-20 MED ORDER — OXYCODONE HCL 5 MG/5ML PO SOLN
ORAL | Status: AC
  Filled 2024-12-20: qty 5

## 2024-12-20 MED ORDER — SUGAMMADEX SODIUM 200 MG/2ML IV SOLN
INTRAVENOUS | Status: AC
  Filled 2024-12-20: qty 2

## 2024-12-20 MED ORDER — LIDOCAINE HCL (CARDIAC) PF 100 MG/5ML IV SOSY
PREFILLED_SYRINGE | INTRAVENOUS | Status: DC | PRN
  Administered 2024-12-20: 100 mg via INTRAVENOUS

## 2024-12-20 MED ORDER — PHENYLEPHRINE HCL-NACL 20-0.9 MG/250ML-% IV SOLN
INTRAVENOUS | Status: DC | PRN
  Administered 2024-12-20: 30 ug/min via INTRAVENOUS

## 2024-12-20 MED ORDER — ONDANSETRON HCL 4 MG/2ML IJ SOLN
INTRAMUSCULAR | Status: DC | PRN
  Administered 2024-12-20: 4 mg via INTRAVENOUS

## 2024-12-20 MED ORDER — CARVEDILOL 3.125 MG PO TABS
3.1250 mg | ORAL_TABLET | ORAL | Status: AC
  Administered 2024-12-20: 3.125 mg via ORAL
  Filled 2024-12-20: qty 1

## 2024-12-20 MED ORDER — OXYCODONE HCL 5 MG PO TABS: 5.0000 mg | ORAL_TABLET | Freq: Once | ORAL | Status: AC | PRN

## 2024-12-20 MED ORDER — LIDOCAINE HCL (PF) 2 % IJ SOLN
INTRAMUSCULAR | Status: AC
  Filled 2024-12-20: qty 5

## 2024-12-20 MED ORDER — ACETAMINOPHEN 500 MG PO TABS
1000.0000 mg | ORAL_TABLET | Freq: Once | ORAL | Status: AC
  Administered 2024-12-20: 1000 mg via ORAL

## 2024-12-20 MED ORDER — ORAL CARE MOUTH RINSE: 15.0000 mL | Freq: Once | OROMUCOSAL | Status: AC

## 2024-12-20 MED ORDER — SUGAMMADEX SODIUM 200 MG/2ML IV SOLN
INTRAVENOUS | Status: DC | PRN
  Administered 2024-12-20: 200 mg via INTRAVENOUS

## 2024-12-20 MED ORDER — BUPIVACAINE HCL (PF) 0.5 % IJ SOLN
INTRAMUSCULAR | Status: DC | PRN
  Administered 2024-12-20: 10 mL

## 2024-12-20 MED ORDER — FENTANYL CITRATE (PF) 50 MCG/ML IJ SOSY: 25.0000 ug | PREFILLED_SYRINGE | INTRAMUSCULAR | Status: DC | PRN

## 2024-12-20 SURGICAL SUPPLY — 28 items
BAG COUNTER SPONGE SURGICOUNT (BAG) ×1 IMPLANT
BLADE SURG 15 STRL LF DISP TIS (BLADE) ×1 IMPLANT
CHLORAPREP W/TINT 26 (MISCELLANEOUS) ×1 IMPLANT
CLIP TI MEDIUM 6 (CLIP) ×2 IMPLANT
CLIP TI WIDE RED SMALL 6 (CLIP) ×2 IMPLANT
COVER SURGICAL LIGHT HANDLE (MISCELLANEOUS) ×1 IMPLANT
DERMABOND ADVANCED .7 DNX12 (GAUZE/BANDAGES/DRESSINGS) ×1 IMPLANT
DRAPE LAPAROTOMY T 98X78 PEDS (DRAPES) ×1 IMPLANT
DRAPE UTILITY XL STRL (DRAPES) ×1 IMPLANT
ELECT PENCIL ROCKER SW 15FT (MISCELLANEOUS) ×1 IMPLANT
ELECT REM PT RETURN 15FT ADLT (MISCELLANEOUS) ×1 IMPLANT
GAUZE 4X4 16PLY ~~LOC~~+RFID DBL (SPONGE) ×1 IMPLANT
GLOVE SURG ORTHO 8.0 STRL STRW (GLOVE) ×1 IMPLANT
GOWN STRL REUS W/ TWL XL LVL3 (GOWN DISPOSABLE) ×3 IMPLANT
HEMOSTAT SURGICEL 2X4 FIBR (HEMOSTASIS) ×1 IMPLANT
ILLUMINATOR WAVEGUIDE N/F (MISCELLANEOUS) IMPLANT
KIT BASIN OR (CUSTOM PROCEDURE TRAY) ×1 IMPLANT
KIT TURNOVER KIT A (KITS) ×1 IMPLANT
NEEDLE HYPO 22X1.5 SAFETY MO (MISCELLANEOUS) ×1 IMPLANT
PACK BASIC VI WITH GOWN DISP (CUSTOM PROCEDURE TRAY) ×1 IMPLANT
PAD MAGNETIC INSTR ST 16X20 (MISCELLANEOUS) ×1 IMPLANT
SHEARS HARMONIC 9CM CVD (BLADE) ×1 IMPLANT
SUT MNCRL AB 4-0 PS2 18 (SUTURE) ×1 IMPLANT
SUT VIC AB 3-0 SH 18 (SUTURE) ×1 IMPLANT
SYR BULB IRRIG 60ML STRL (SYRINGE) ×1 IMPLANT
SYR CONTROL 10ML LL (SYRINGE) ×1 IMPLANT
TOWEL OR DSP ST BLU DLX 10/PK (DISPOSABLE) ×1 IMPLANT
TUBING CONNECTING 10 (TUBING) ×1 IMPLANT

## 2024-12-20 NOTE — Op Note (Signed)
 OPERATIVE REPORT - PARATHYROIDECTOMY  Preoperative diagnosis: Primary hyperparathyroidism  Postop diagnosis: Same  Procedure: Right minimally invasive parathyroidectomy  Surgeon:  Krystal Spinner, MD  Anesthesia: General endotracheal  Estimated blood loss: Minimal  Preparation: ChloraPrep  Indications: Patient is referred by Dr. Littie Caffey for surgical evaluation and management of newly diagnosed primary hyperparathyroidism. Patient was noted after a back injury when evaluated by her orthopedic surgeon to have probable osteoporosis. She was referred to endocrinology. Evaluation by Dr. Littie Caffey identified signs and symptoms of primary hyperparathyroidism. Calcium level at her most recent laboratory studies was elevated at 12.0. Intact PTH level was elevated at 162. Patient underwent a nuclear medicine parathyroid  scan with sestamibi on August 03, 2024. 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 Patient has no prior history of head or neck surgery. There is a history of medical thyroid  disease in the patient's mother and sister. Patient notes fatigue. She denies nephrolithiasis. She likely has osteoporosis. She does note some bone and joint discomfort. Patient presents today for further recommendations for evaluation and management of newly diagnosed primary hyperparathyroidism.   Procedure: The patient was prepared in the pre-operative holding area. The patient was brought to the operating room and placed in a supine position on the operating room table. Following administration of general anesthesia, the patient was positioned and then prepped and draped in the usual strict aseptic fashion. After ascertaining that an adequate level of anesthesia been achieved, a neck incision was made with a #15 blade. Dissection was carried through subcutaneous tissues and platysma. Hemostasis was  obtained with the electrocautery. Skin flaps were developed circumferentially and a Weitlander retractor was placed for exposure.  Strap muscles were incised in the midline. Strap muscles were reflected laterally exposing the thyroid  lobe. With gentle blunt dissection the thyroid  lobe was mobilized.  Dissection was carried posteriorly and inferiorly an enlarged parathyroid  gland was identified. It was gently mobilized. Vascular structures were divided between small ligaclips. Care was taken to avoid the recurrent laryngeal nerve. The parathyroid  gland was completely excised. It was submitted to pathology where frozen section confirmed hypercellular parathyroid  tissue consistent with adenoma.  Neck was irrigated with warm saline and good hemostasis was noted. Fibrillar was placed in the operative field. Strap muscles were approximated in the midline with interrupted 3-0 Vicryl sutures. Platysma was closed with interrupted 3-0 Vicryl sutures. Marcaine  was infiltrated circumferentially. Skin was closed with a running 4-0 Monocryl subcuticular suture. Wound was washed and dried and Dermabond was applied. Patient was awakened from anesthesia and brought to the recovery room. The patient tolerated the procedure well.   Krystal Spinner, MD [facility] Office: [phone]

## 2024-12-20 NOTE — Transfer of Care (Signed)
 Immediate Anesthesia Transfer of Care Note  Patient: Kelli Webster  Procedure(s) Performed: PARATHYROIDECTOMY (Right: Neck)  Patient Location: PACU  Anesthesia Type:General  Level of Consciousness: awake, alert , and oriented  Airway & Oxygen Therapy: Patient Spontanous Breathing and Patient connected to face mask oxygen  Post-op Assessment: Report given to RN, Post -op Vital signs reviewed and stable, and Patient moving all extremities X 4  Post vital signs: Reviewed and stable  Last Vitals:  Vitals Value Taken Time  BP 138/66   Temp    Pulse 52   Resp 12   SpO2 100     Last Pain:  Vitals:   12/20/24 0720  TempSrc: Oral         Complications: No notable events documented.

## 2024-12-20 NOTE — Discharge Instructions (Signed)

## 2024-12-20 NOTE — Interval H&P Note (Signed)
 History and Physical Interval Note:  12/20/2024 7:06 AM  Kelli Webster  has presented today for surgery, with the diagnosis of PRIMARY HYPERPARATHYROIDISM.  The various methods of treatment have been discussed with the patient and family. After consideration of risks, benefits and other options for treatment, the patient has consented to    Procedures with comments: PARATHYROIDECTOMY (Right) - RIGHT INFERIOR MINIMALLY INVASIVE PARATHYROIDECTOMY as a surgical intervention.    The patient's history has been reviewed, patient examined, no change in status, stable for surgery.  I have reviewed the patient's chart and labs.  Questions were answered to the patient's satisfaction.    Krystal Spinner, MD [facility] practice Office: [phone]   Krystal Spinner

## 2024-12-20 NOTE — Interval H&P Note (Signed)
 History and Physical Interval Note:  12/20/2024 9:02 AM  Kelli Webster  has presented today for surgery, with the diagnosis of PRIMARY HYPERPARATHYROIDISM.  The various methods of treatment have been discussed with the patient and family. After consideration of risks, benefits and other options for treatment, the patient has consented to    Procedures with comments: PARATHYROIDECTOMY (Right) - RIGHT INFERIOR MINIMALLY INVASIVE PARATHYROIDECTOMY as a surgical intervention.    The patient's history has been reviewed, patient examined, no change in status, stable for surgery.  I have reviewed the patient's chart and labs.  Questions were answered to the patient's satisfaction.    Krystal Spinner, MD [facility] practice Office: [phone]   Krystal Spinner

## 2024-12-20 NOTE — Anesthesia Procedure Notes (Signed)
 Procedure Name: Intubation Date/Time: 12/20/2024 9:35 AM  Performed by: Brandy Almarie BROCKS, CRNAPre-anesthesia Checklist: Patient identified, Emergency Drugs available, Suction available and Patient being monitored Patient Re-evaluated:Patient Re-evaluated prior to induction Oxygen Delivery Method: Circle system utilized Preoxygenation: Pre-oxygenation with 100% oxygen Induction Type: IV induction Ventilation: Mask ventilation without difficulty Laryngoscope Size: Mac and 4 Grade View: Grade I Tube type: Oral Tube size: 7.0 mm Number of attempts: 1 Airway Equipment and Method: Stylet Placement Confirmation: ETT inserted through vocal cords under direct vision, positive ETCO2 and breath sounds checked- equal and bilateral Secured at: 21 cm Tube secured with: Tape Dental Injury: Teeth and Oropharynx as per pre-operative assessment

## 2024-12-23 ENCOUNTER — Encounter (HOSPITAL_COMMUNITY): Payer: Self-pay | Admitting: Surgery

## 2024-12-23 LAB — SURGICAL PATHOLOGY

## 2024-12-24 NOTE — Anesthesia Postprocedure Evaluation (Signed)
"   Anesthesia Post Note  Patient: Kelli Webster  Procedure(s) Performed: PARATHYROIDECTOMY (Right: Neck)     Patient location during evaluation: PACU Anesthesia Type: General Level of consciousness: awake and alert Pain management: pain level controlled Vital Signs Assessment: post-procedure vital signs reviewed and stable Respiratory status: spontaneous breathing, nonlabored ventilation, respiratory function stable and patient connected to nasal cannula oxygen Cardiovascular status: blood pressure returned to baseline and stable Postop Assessment: no apparent nausea or vomiting Anesthetic complications: no   No notable events documented.  Last Vitals:  Vitals:   12/20/24 1215 12/20/24 1224  BP: 123/89 (!) 130/56  Pulse: (!) 46 (!) 55  Resp: 18 20  Temp:  (!) 36.4 C  SpO2: 97% 95%    Last Pain:  Vitals:   12/20/24 1245  TempSrc:   PainSc: 3    Pain Goal: Patients Stated Pain Goal: 3 (12/20/24 1215)                 Maiyah Goyne L Shanta Hartner      "

## 2025-01-23 ENCOUNTER — Ambulatory Visit (HOSPITAL_COMMUNITY): Admitting: Cardiology

## 2025-02-14 ENCOUNTER — Ambulatory Visit (HOSPITAL_COMMUNITY): Admitting: Cardiology
